# Patient Record
Sex: Female | Born: 1940 | Race: White | Hispanic: No | Smoking: Never smoker
Health system: Southern US, Community
[De-identification: ages and names within clinical notes are randomized; demographics above are authoritative.]

## PROBLEM LIST (undated history)

## (undated) DIAGNOSIS — N189 Chronic kidney disease, unspecified: Secondary | ICD-10-CM

## (undated) DIAGNOSIS — M1990 Unspecified osteoarthritis, unspecified site: Secondary | ICD-10-CM

## (undated) DIAGNOSIS — N179 Acute kidney failure, unspecified: Secondary | ICD-10-CM

## (undated) DIAGNOSIS — I8290 Acute embolism and thrombosis of unspecified vein: Secondary | ICD-10-CM

## (undated) DIAGNOSIS — F32A Depression, unspecified: Secondary | ICD-10-CM

## (undated) DIAGNOSIS — M797 Fibromyalgia: Secondary | ICD-10-CM

## (undated) DIAGNOSIS — T7840XA Allergy, unspecified, initial encounter: Secondary | ICD-10-CM

## (undated) DIAGNOSIS — K449 Diaphragmatic hernia without obstruction or gangrene: Secondary | ICD-10-CM

## (undated) DIAGNOSIS — N200 Calculus of kidney: Secondary | ICD-10-CM

## (undated) DIAGNOSIS — Z87442 Personal history of urinary calculi: Secondary | ICD-10-CM

## (undated) DIAGNOSIS — I10 Essential (primary) hypertension: Secondary | ICD-10-CM

## (undated) DIAGNOSIS — F419 Anxiety disorder, unspecified: Secondary | ICD-10-CM

## (undated) DIAGNOSIS — E785 Hyperlipidemia, unspecified: Secondary | ICD-10-CM

## (undated) DIAGNOSIS — R42 Dizziness and giddiness: Secondary | ICD-10-CM

## (undated) DIAGNOSIS — K219 Gastro-esophageal reflux disease without esophagitis: Secondary | ICD-10-CM

## (undated) DIAGNOSIS — N3010 Interstitial cystitis (chronic) without hematuria: Secondary | ICD-10-CM

## (undated) DIAGNOSIS — Z9889 Other specified postprocedural states: Secondary | ICD-10-CM

## (undated) DIAGNOSIS — R519 Headache, unspecified: Secondary | ICD-10-CM

## (undated) DIAGNOSIS — F329 Major depressive disorder, single episode, unspecified: Secondary | ICD-10-CM

## (undated) DIAGNOSIS — K5730 Diverticulosis of large intestine without perforation or abscess without bleeding: Secondary | ICD-10-CM

## (undated) DIAGNOSIS — C4491 Basal cell carcinoma of skin, unspecified: Secondary | ICD-10-CM

## (undated) DIAGNOSIS — D649 Anemia, unspecified: Secondary | ICD-10-CM

## (undated) DIAGNOSIS — M419 Scoliosis, unspecified: Secondary | ICD-10-CM

## (undated) DIAGNOSIS — F450 Somatization disorder: Secondary | ICD-10-CM

## (undated) DIAGNOSIS — M8580 Other specified disorders of bone density and structure, unspecified site: Secondary | ICD-10-CM

## (undated) DIAGNOSIS — C801 Malignant (primary) neoplasm, unspecified: Secondary | ICD-10-CM

## (undated) DIAGNOSIS — R112 Nausea with vomiting, unspecified: Secondary | ICD-10-CM

## (undated) DIAGNOSIS — I2510 Atherosclerotic heart disease of native coronary artery without angina pectoris: Secondary | ICD-10-CM

## (undated) HISTORY — DX: Gastro-esophageal reflux disease without esophagitis: K21.9

## (undated) HISTORY — DX: Diaphragmatic hernia without obstruction or gangrene: K44.9

## (undated) HISTORY — DX: Major depressive disorder, single episode, unspecified: F32.9

## (undated) HISTORY — DX: Depression, unspecified: F32.A

## (undated) HISTORY — DX: Diverticulosis of large intestine without perforation or abscess without bleeding: K57.30

## (undated) HISTORY — PX: UPPER GASTROINTESTINAL ENDOSCOPY: SHX188

## (undated) HISTORY — DX: Chronic kidney disease, unspecified: N18.9

## (undated) HISTORY — DX: Allergy, unspecified, initial encounter: T78.40XA

## (undated) HISTORY — DX: Scoliosis, unspecified: M41.9

## (undated) HISTORY — DX: Interstitial cystitis (chronic) without hematuria: N30.10

## (undated) HISTORY — DX: Atherosclerotic heart disease of native coronary artery without angina pectoris: I25.10

## (undated) HISTORY — PX: CHOLECYSTECTOMY: SHX55

## (undated) HISTORY — PX: TOTAL KNEE ARTHROPLASTY: SHX125

## (undated) HISTORY — DX: Unspecified osteoarthritis, unspecified site: M19.90

## (undated) HISTORY — DX: Basal cell carcinoma of skin, unspecified: C44.91

## (undated) HISTORY — DX: Hyperlipidemia, unspecified: E78.5

## (undated) HISTORY — PX: CATARACT EXTRACTION: SUR2

## (undated) HISTORY — DX: Acute kidney failure, unspecified: N17.9

## (undated) HISTORY — PX: HIATAL HERNIA REPAIR: SHX195

## (undated) HISTORY — DX: Other specified disorders of bone density and structure, unspecified site: M85.80

## (undated) HISTORY — PX: COLOSTOMY: SHX63

## (undated) HISTORY — DX: Essential (primary) hypertension: I10

## (undated) HISTORY — DX: Calculus of kidney: N20.0

## (undated) HISTORY — DX: Somatization disorder: F45.0

## (undated) HISTORY — PX: LUMBAR DISC SURGERY: SHX700

## (undated) HISTORY — DX: Anxiety disorder, unspecified: F41.9

## (undated) HISTORY — DX: Malignant (primary) neoplasm, unspecified: C80.1

---

## 1998-05-25 ENCOUNTER — Ambulatory Visit: Admission: RE | Admit: 1998-05-25 | Discharge: 1998-05-25 | Payer: Self-pay | Admitting: Family Medicine

## 1999-03-29 ENCOUNTER — Inpatient Hospital Stay (HOSPITAL_COMMUNITY): Admission: EM | Admit: 1999-03-29 | Discharge: 1999-04-06 | Payer: Self-pay | Admitting: Vascular Surgery

## 1999-04-05 ENCOUNTER — Encounter: Payer: Self-pay | Admitting: Vascular Surgery

## 1999-04-23 HISTORY — PX: CYSTECTOMY: SUR359

## 1999-04-23 HISTORY — PX: BLADDER TUMOR EXCISION: SHX238

## 1999-05-15 ENCOUNTER — Encounter: Admission: RE | Admit: 1999-05-15 | Discharge: 1999-05-15 | Payer: Self-pay | Admitting: Internal Medicine

## 1999-05-16 ENCOUNTER — Emergency Department (HOSPITAL_COMMUNITY): Admission: EM | Admit: 1999-05-16 | Discharge: 1999-05-16 | Payer: Self-pay | Admitting: Emergency Medicine

## 1999-05-16 ENCOUNTER — Encounter: Payer: Self-pay | Admitting: Emergency Medicine

## 1999-08-09 ENCOUNTER — Encounter: Admission: RE | Admit: 1999-08-09 | Discharge: 1999-08-09 | Payer: Self-pay | Admitting: Family Medicine

## 1999-08-09 ENCOUNTER — Encounter: Payer: Self-pay | Admitting: Family Medicine

## 2000-04-09 ENCOUNTER — Emergency Department (HOSPITAL_COMMUNITY): Admission: EM | Admit: 2000-04-09 | Discharge: 2000-04-09 | Payer: Self-pay | Admitting: Emergency Medicine

## 2000-04-10 ENCOUNTER — Encounter: Payer: Self-pay | Admitting: Emergency Medicine

## 2000-10-14 ENCOUNTER — Encounter: Admission: RE | Admit: 2000-10-14 | Discharge: 2000-10-14 | Payer: Self-pay | Admitting: Orthopedic Surgery

## 2000-10-14 ENCOUNTER — Encounter: Payer: Self-pay | Admitting: Orthopedic Surgery

## 2001-04-23 ENCOUNTER — Encounter: Payer: Self-pay | Admitting: Internal Medicine

## 2001-04-23 ENCOUNTER — Encounter: Admission: RE | Admit: 2001-04-23 | Discharge: 2001-04-23 | Payer: Self-pay | Admitting: Internal Medicine

## 2001-05-01 ENCOUNTER — Encounter: Payer: Self-pay | Admitting: Internal Medicine

## 2001-05-12 ENCOUNTER — Encounter: Payer: Self-pay | Admitting: Orthopedic Surgery

## 2001-05-12 ENCOUNTER — Encounter: Admission: RE | Admit: 2001-05-12 | Discharge: 2001-05-12 | Payer: Self-pay | Admitting: Orthopedic Surgery

## 2001-07-08 ENCOUNTER — Encounter: Payer: Self-pay | Admitting: Gastroenterology

## 2001-11-20 ENCOUNTER — Encounter: Payer: Self-pay | Admitting: Emergency Medicine

## 2001-11-20 ENCOUNTER — Emergency Department (HOSPITAL_COMMUNITY): Admission: EM | Admit: 2001-11-20 | Discharge: 2001-11-20 | Payer: Self-pay | Admitting: Emergency Medicine

## 2002-05-25 ENCOUNTER — Encounter: Payer: Self-pay | Admitting: Internal Medicine

## 2002-05-25 ENCOUNTER — Encounter: Admission: RE | Admit: 2002-05-25 | Discharge: 2002-05-25 | Payer: Self-pay | Admitting: Internal Medicine

## 2002-06-08 ENCOUNTER — Encounter (INDEPENDENT_AMBULATORY_CARE_PROVIDER_SITE_OTHER): Payer: Self-pay | Admitting: *Deleted

## 2002-06-08 ENCOUNTER — Encounter: Admission: RE | Admit: 2002-06-08 | Discharge: 2002-06-08 | Payer: Self-pay | Admitting: Otolaryngology

## 2002-06-08 ENCOUNTER — Encounter: Payer: Self-pay | Admitting: Otolaryngology

## 2002-07-20 ENCOUNTER — Other Ambulatory Visit: Admission: RE | Admit: 2002-07-20 | Discharge: 2002-07-20 | Payer: Self-pay | Admitting: Internal Medicine

## 2002-12-01 ENCOUNTER — Encounter: Payer: Self-pay | Admitting: Specialist

## 2002-12-03 ENCOUNTER — Inpatient Hospital Stay (HOSPITAL_COMMUNITY): Admission: RE | Admit: 2002-12-03 | Discharge: 2002-12-09 | Payer: Self-pay | Admitting: Specialist

## 2003-06-08 ENCOUNTER — Encounter: Admission: RE | Admit: 2003-06-08 | Discharge: 2003-06-08 | Payer: Self-pay | Admitting: Internal Medicine

## 2004-02-21 ENCOUNTER — Ambulatory Visit: Payer: Self-pay | Admitting: Internal Medicine

## 2004-02-28 ENCOUNTER — Ambulatory Visit: Payer: Self-pay | Admitting: Gastroenterology

## 2004-06-12 ENCOUNTER — Encounter: Admission: RE | Admit: 2004-06-12 | Discharge: 2004-06-12 | Payer: Self-pay | Admitting: Obstetrics and Gynecology

## 2004-07-06 ENCOUNTER — Ambulatory Visit: Payer: Self-pay | Admitting: Internal Medicine

## 2004-11-01 ENCOUNTER — Ambulatory Visit: Payer: Self-pay | Admitting: Family Medicine

## 2005-03-07 ENCOUNTER — Ambulatory Visit: Payer: Self-pay | Admitting: Internal Medicine

## 2005-03-11 ENCOUNTER — Encounter (INDEPENDENT_AMBULATORY_CARE_PROVIDER_SITE_OTHER): Payer: Self-pay | Admitting: *Deleted

## 2005-03-11 ENCOUNTER — Encounter: Admission: RE | Admit: 2005-03-11 | Discharge: 2005-03-11 | Payer: Self-pay | Admitting: Internal Medicine

## 2005-04-03 ENCOUNTER — Ambulatory Visit: Payer: Self-pay | Admitting: Internal Medicine

## 2005-04-10 ENCOUNTER — Ambulatory Visit: Payer: Self-pay | Admitting: Internal Medicine

## 2005-06-05 ENCOUNTER — Emergency Department (HOSPITAL_COMMUNITY): Admission: EM | Admit: 2005-06-05 | Discharge: 2005-06-05 | Payer: Self-pay | Admitting: Emergency Medicine

## 2005-09-12 ENCOUNTER — Ambulatory Visit (HOSPITAL_COMMUNITY): Admission: RE | Admit: 2005-09-12 | Discharge: 2005-09-12 | Payer: Self-pay | Admitting: Obstetrics and Gynecology

## 2005-11-06 ENCOUNTER — Encounter: Payer: Self-pay | Admitting: Internal Medicine

## 2005-11-20 HISTORY — PX: TENDON REPAIR: SHX5111

## 2006-07-28 ENCOUNTER — Ambulatory Visit: Payer: Self-pay | Admitting: Internal Medicine

## 2006-07-28 LAB — CONVERTED CEMR LAB
ALT: 13 units/L (ref 0–40)
AST: 20 units/L (ref 0–37)
Albumin: 4 g/dL (ref 3.5–5.2)
Alkaline Phosphatase: 63 units/L (ref 39–117)
BUN: 18 mg/dL (ref 6–23)
Basophils Absolute: 0 10*3/uL (ref 0.0–0.1)
Basophils Relative: 1 % (ref 0.0–1.0)
Bilirubin, Direct: 0.1 mg/dL (ref 0.0–0.3)
CO2: 30 meq/L (ref 19–32)
Calcium: 10.2 mg/dL (ref 8.4–10.5)
Chloride: 104 meq/L (ref 96–112)
Cholesterol: 234 mg/dL (ref 0–200)
Creatinine, Ser: 1 mg/dL (ref 0.4–1.2)
Direct LDL: 157.3 mg/dL
Eosinophils Absolute: 0.1 10*3/uL (ref 0.0–0.6)
Eosinophils Relative: 2.6 % (ref 0.0–5.0)
GFR calc Af Amer: 72 mL/min
GFR calc non Af Amer: 59 mL/min
Glucose, Bld: 108 mg/dL — ABNORMAL HIGH (ref 70–99)
HCT: 42.5 % (ref 36.0–46.0)
HDL: 54.6 mg/dL (ref >39.0)
Hemoglobin: 14.5 g/dL (ref 12.0–15.0)
Lymphocytes Relative: 25.6 % (ref 12.0–46.0)
MCHC: 34 g/dL (ref 30.0–36.0)
MCV: 87.9 fL (ref 78.0–100.0)
Monocytes Absolute: 0.3 10*3/uL (ref 0.2–0.7)
Monocytes Relative: 7.3 % (ref 3.0–11.0)
Neutro Abs: 2.9 10*3/uL (ref 1.4–7.7)
Neutrophils Relative %: 63.5 % (ref 43.0–77.0)
Platelets: 194 10*3/uL (ref 150–400)
Potassium: 3.8 meq/L (ref 3.5–5.1)
RBC: 4.83 M/uL (ref 3.87–5.11)
RDW: 14.1 % (ref 11.5–14.6)
Sodium: 141 meq/L (ref 135–145)
TSH: 3.29 microintl units/mL (ref 0.35–5.50)
Total Bilirubin: 1 mg/dL (ref 0.3–1.2)
Total CHOL/HDL Ratio: 4.3
Total Protein: 7.1 g/dL (ref 6.0–8.3)
Triglycerides: 176 mg/dL — ABNORMAL HIGH (ref 0–149)
VLDL: 35 mg/dL (ref 0–40)
WBC: 4.5 10*3/uL (ref 4.5–10.5)

## 2006-09-18 ENCOUNTER — Ambulatory Visit (HOSPITAL_COMMUNITY): Admission: RE | Admit: 2006-09-18 | Discharge: 2006-09-18 | Payer: Self-pay | Admitting: Internal Medicine

## 2006-10-09 ENCOUNTER — Ambulatory Visit: Payer: Self-pay | Admitting: Internal Medicine

## 2006-10-09 LAB — CONVERTED CEMR LAB
ALT: 16 units/L (ref 0–40)
AST: 23 units/L (ref 0–37)
Cholesterol: 178 mg/dL (ref 0–200)
Glucose, Bld: 105 mg/dL — ABNORMAL HIGH (ref 70–99)
HDL: 48.7 mg/dL (ref >39.0)
LDL Cholesterol: 114 mg/dL — ABNORMAL HIGH (ref 0–99)
Total CHOL/HDL Ratio: 3.7
Triglycerides: 76 mg/dL (ref 0–149)
VLDL: 15 mg/dL (ref 0–40)

## 2006-10-22 ENCOUNTER — Telehealth: Payer: Self-pay | Admitting: Internal Medicine

## 2006-12-01 ENCOUNTER — Telehealth: Payer: Self-pay | Admitting: Internal Medicine

## 2006-12-24 DIAGNOSIS — E785 Hyperlipidemia, unspecified: Secondary | ICD-10-CM | POA: Insufficient documentation

## 2006-12-24 DIAGNOSIS — M1990 Unspecified osteoarthritis, unspecified site: Secondary | ICD-10-CM | POA: Insufficient documentation

## 2006-12-24 DIAGNOSIS — F411 Generalized anxiety disorder: Secondary | ICD-10-CM | POA: Insufficient documentation

## 2006-12-24 DIAGNOSIS — K219 Gastro-esophageal reflux disease without esophagitis: Secondary | ICD-10-CM | POA: Insufficient documentation

## 2006-12-24 DIAGNOSIS — I10 Essential (primary) hypertension: Secondary | ICD-10-CM | POA: Insufficient documentation

## 2006-12-24 DIAGNOSIS — K449 Diaphragmatic hernia without obstruction or gangrene: Secondary | ICD-10-CM | POA: Insufficient documentation

## 2007-03-27 ENCOUNTER — Telehealth: Payer: Self-pay | Admitting: Internal Medicine

## 2007-05-25 ENCOUNTER — Telehealth: Payer: Self-pay | Admitting: Internal Medicine

## 2007-08-05 ENCOUNTER — Telehealth: Payer: Self-pay | Admitting: *Deleted

## 2007-09-02 ENCOUNTER — Telehealth: Payer: Self-pay | Admitting: Internal Medicine

## 2007-09-02 ENCOUNTER — Encounter: Payer: Self-pay | Admitting: Internal Medicine

## 2007-10-02 ENCOUNTER — Ambulatory Visit: Payer: Self-pay | Admitting: Internal Medicine

## 2007-10-09 ENCOUNTER — Ambulatory Visit: Payer: Self-pay | Admitting: Internal Medicine

## 2007-10-12 LAB — CONVERTED CEMR LAB
ALT: 15 units/L (ref 0–35)
AST: 19 units/L (ref 0–37)
Albumin: 3.6 g/dL (ref 3.5–5.2)
Alkaline Phosphatase: 65 units/L (ref 39–117)
BUN: 22 mg/dL (ref 6–23)
Basophils Absolute: 0 10*3/uL (ref 0.0–0.1)
Basophils Relative: 0.6 % (ref 0.0–1.0)
Bilirubin, Direct: 0.1 mg/dL (ref 0.0–0.3)
CO2: 30 meq/L (ref 19–32)
Calcium: 9.3 mg/dL (ref 8.4–10.5)
Chloride: 109 meq/L (ref 96–112)
Cholesterol: 259 mg/dL (ref 0–200)
Creatinine, Ser: 1.1 mg/dL (ref 0.4–1.2)
Direct LDL: 179.1 mg/dL
Eosinophils Absolute: 0.2 10*3/uL (ref 0.0–0.7)
Eosinophils Relative: 4.7 % (ref 0.0–5.0)
GFR calc Af Amer: 64 mL/min
GFR calc non Af Amer: 53 mL/min
Glucose, Bld: 92 mg/dL (ref 70–99)
HCT: 39.8 % (ref 36.0–46.0)
HDL: 55 mg/dL (ref >39.0)
Hemoglobin: 13.4 g/dL (ref 12.0–15.0)
Lymphocytes Relative: 33.8 % (ref 12.0–46.0)
MCHC: 33.7 g/dL (ref 30.0–36.0)
MCV: 90.2 fL (ref 78.0–100.0)
Monocytes Absolute: 0.3 10*3/uL (ref 0.1–1.0)
Monocytes Relative: 7.6 % (ref 3.0–12.0)
Neutro Abs: 1.8 10*3/uL (ref 1.4–7.7)
Neutrophils Relative %: 53.3 % (ref 43.0–77.0)
Platelets: 165 10*3/uL (ref 150–400)
Potassium: 4.3 meq/L (ref 3.5–5.1)
RBC: 4.41 M/uL (ref 3.87–5.11)
RDW: 12.4 % (ref 11.5–14.6)
Sodium: 143 meq/L (ref 135–145)
TSH: 4.43 microintl units/mL (ref 0.35–5.50)
Total Bilirubin: 0.7 mg/dL (ref 0.3–1.2)
Total CHOL/HDL Ratio: 4.7
Total Protein: 6.4 g/dL (ref 6.0–8.3)
Triglycerides: 91 mg/dL (ref 0–149)
VLDL: 18 mg/dL (ref 0–40)
WBC: 3.5 10*3/uL — ABNORMAL LOW (ref 4.5–10.5)

## 2007-10-16 ENCOUNTER — Telehealth: Payer: Self-pay | Admitting: Internal Medicine

## 2007-10-20 ENCOUNTER — Ambulatory Visit (HOSPITAL_COMMUNITY): Admission: RE | Admit: 2007-10-20 | Discharge: 2007-10-20 | Payer: Self-pay | Admitting: Internal Medicine

## 2007-11-29 ENCOUNTER — Emergency Department (HOSPITAL_COMMUNITY): Admission: EM | Admit: 2007-11-29 | Discharge: 2007-11-30 | Payer: Self-pay | Admitting: Emergency Medicine

## 2007-12-09 ENCOUNTER — Ambulatory Visit: Payer: Self-pay | Admitting: Family Medicine

## 2007-12-09 DIAGNOSIS — B354 Tinea corporis: Secondary | ICD-10-CM | POA: Insufficient documentation

## 2008-01-19 ENCOUNTER — Encounter: Payer: Self-pay | Admitting: Internal Medicine

## 2008-01-22 ENCOUNTER — Telehealth: Payer: Self-pay | Admitting: Internal Medicine

## 2008-02-06 ENCOUNTER — Ambulatory Visit: Payer: Self-pay | Admitting: Internal Medicine

## 2008-02-11 ENCOUNTER — Telehealth: Payer: Self-pay | Admitting: Internal Medicine

## 2008-02-16 ENCOUNTER — Ambulatory Visit: Payer: Self-pay | Admitting: Internal Medicine

## 2008-02-16 DIAGNOSIS — R079 Chest pain, unspecified: Secondary | ICD-10-CM | POA: Insufficient documentation

## 2008-02-16 DIAGNOSIS — F4321 Adjustment disorder with depressed mood: Secondary | ICD-10-CM | POA: Insufficient documentation

## 2008-02-25 ENCOUNTER — Telehealth: Payer: Self-pay | Admitting: Gastroenterology

## 2008-03-01 ENCOUNTER — Telehealth: Payer: Self-pay | Admitting: Internal Medicine

## 2008-03-04 ENCOUNTER — Ambulatory Visit: Payer: Self-pay | Admitting: Cardiology

## 2008-03-15 ENCOUNTER — Ambulatory Visit: Payer: Self-pay

## 2008-03-15 ENCOUNTER — Encounter: Payer: Self-pay | Admitting: Internal Medicine

## 2008-04-12 ENCOUNTER — Other Ambulatory Visit: Admission: RE | Admit: 2008-04-12 | Discharge: 2008-04-12 | Payer: Self-pay | Admitting: Internal Medicine

## 2008-04-12 ENCOUNTER — Ambulatory Visit: Payer: Self-pay | Admitting: Internal Medicine

## 2008-04-12 DIAGNOSIS — I8390 Asymptomatic varicose veins of unspecified lower extremity: Secondary | ICD-10-CM | POA: Insufficient documentation

## 2008-04-12 DIAGNOSIS — K589 Irritable bowel syndrome without diarrhea: Secondary | ICD-10-CM | POA: Insufficient documentation

## 2008-04-12 DIAGNOSIS — M949 Disorder of cartilage, unspecified: Secondary | ICD-10-CM

## 2008-04-12 DIAGNOSIS — M899 Disorder of bone, unspecified: Secondary | ICD-10-CM | POA: Insufficient documentation

## 2008-04-19 ENCOUNTER — Telehealth: Payer: Self-pay | Admitting: Internal Medicine

## 2008-05-02 DIAGNOSIS — Z8719 Personal history of other diseases of the digestive system: Secondary | ICD-10-CM | POA: Insufficient documentation

## 2008-05-02 DIAGNOSIS — R1310 Dysphagia, unspecified: Secondary | ICD-10-CM | POA: Insufficient documentation

## 2008-05-05 ENCOUNTER — Ambulatory Visit: Payer: Self-pay | Admitting: Gastroenterology

## 2008-05-05 DIAGNOSIS — F450 Somatization disorder: Secondary | ICD-10-CM | POA: Insufficient documentation

## 2008-05-05 DIAGNOSIS — F4323 Adjustment disorder with mixed anxiety and depressed mood: Secondary | ICD-10-CM | POA: Insufficient documentation

## 2008-05-05 HISTORY — DX: Somatization disorder: F45.0

## 2008-05-05 LAB — CONVERTED CEMR LAB: Tissue Transglutaminase Ab, IgA: 0.2 units (ref <7)

## 2008-05-06 LAB — CONVERTED CEMR LAB
ALT: 18 units/L (ref 0–35)
AST: 18 units/L (ref 0–37)
Albumin: 3.7 g/dL (ref 3.5–5.2)
BUN: 18 mg/dL (ref 6–23)
Basophils Absolute: 0 10*3/uL (ref 0.0–0.1)
Basophils Relative: 0.1 % (ref 0.0–3.0)
Calcium: 9.5 mg/dL (ref 8.4–10.5)
Chloride: 108 meq/L (ref 96–112)
Creatinine, Ser: 1 mg/dL (ref 0.4–1.2)
Eosinophils Absolute: 0.1 10*3/uL (ref 0.0–0.7)
Ferritin: 64.6 ng/mL (ref 10.0–291.0)
Folate: >20 ng/mL
GFR calc non Af Amer: 59 mL/min
Glucose, Bld: 96 mg/dL (ref 70–99)
HCT: 38 % (ref 36.0–46.0)
Hemoglobin: 12.8 g/dL (ref 12.0–15.0)
Iron: 42 ug/dL (ref 42–145)
Lymphocytes Relative: 14 % (ref 12.0–46.0)
MCHC: 33.7 g/dL (ref 30.0–36.0)
MCV: 90.4 fL (ref 78.0–100.0)
Monocytes Absolute: 0.5 10*3/uL (ref 0.1–1.0)
Monocytes Relative: 5.6 % (ref 3.0–12.0)
Neutro Abs: 7.2 10*3/uL (ref 1.4–7.7)
Neutrophils Relative %: 79.4 % — ABNORMAL HIGH (ref 43.0–77.0)
Platelets: 191 10*3/uL (ref 150–400)
RBC: 4.2 M/uL (ref 3.87–5.11)
RDW: 12.9 % (ref 11.5–14.6)
Saturation Ratios: 14.1 % — ABNORMAL LOW (ref 20.0–50.0)
Sodium: 143 meq/L (ref 135–145)
TSH: 1.62 microintl units/mL (ref 0.35–5.50)
Total Bilirubin: 0.9 mg/dL (ref 0.3–1.2)
Total Protein: 6.8 g/dL (ref 6.0–8.3)
Transferrin: 212.4 mg/dL (ref >=212.0)
Vitamin B-12: 220 pg/mL (ref 211–911)
WBC: 9.1 10*3/uL (ref 4.5–10.5)

## 2008-05-10 ENCOUNTER — Ambulatory Visit: Payer: Self-pay | Admitting: Gastroenterology

## 2008-05-10 DIAGNOSIS — D518 Other vitamin B12 deficiency anemias: Secondary | ICD-10-CM | POA: Insufficient documentation

## 2008-05-17 ENCOUNTER — Ambulatory Visit: Payer: Self-pay | Admitting: Gastroenterology

## 2008-05-25 ENCOUNTER — Telehealth: Payer: Self-pay | Admitting: Gastroenterology

## 2008-05-26 ENCOUNTER — Ambulatory Visit: Payer: Self-pay | Admitting: Gastroenterology

## 2008-06-15 ENCOUNTER — Ambulatory Visit: Payer: Self-pay | Admitting: Internal Medicine

## 2008-06-15 ENCOUNTER — Ambulatory Visit: Payer: Self-pay | Admitting: Gastroenterology

## 2008-06-17 ENCOUNTER — Ambulatory Visit: Payer: Self-pay | Admitting: Family Medicine

## 2008-06-27 ENCOUNTER — Telehealth: Payer: Self-pay | Admitting: Internal Medicine

## 2008-06-29 ENCOUNTER — Ambulatory Visit: Payer: Self-pay | Admitting: Gastroenterology

## 2008-06-29 ENCOUNTER — Encounter: Payer: Self-pay | Admitting: Internal Medicine

## 2008-06-29 ENCOUNTER — Encounter: Payer: Self-pay | Admitting: Gastroenterology

## 2008-07-01 ENCOUNTER — Encounter: Payer: Self-pay | Admitting: Gastroenterology

## 2008-07-06 ENCOUNTER — Ambulatory Visit: Payer: Self-pay | Admitting: Gastroenterology

## 2008-07-06 ENCOUNTER — Encounter: Payer: Self-pay | Admitting: Gastroenterology

## 2008-07-11 ENCOUNTER — Encounter: Payer: Self-pay | Admitting: Gastroenterology

## 2008-07-15 ENCOUNTER — Ambulatory Visit: Payer: Self-pay | Admitting: Internal Medicine

## 2008-07-15 LAB — CONVERTED CEMR LAB
ALT: 18 units/L (ref 0–35)
AST: 22 units/L (ref 0–37)
Albumin: 3.4 g/dL — ABNORMAL LOW (ref 3.5–5.2)
Alkaline Phosphatase: 72 units/L (ref 39–117)
Basophils Absolute: 0 10*3/uL (ref 0.0–0.1)
Basophils Relative: 0.3 % (ref 0.0–3.0)
Bilirubin, Direct: 0.1 mg/dL (ref 0.0–0.3)
Calcium: 9.3 mg/dL (ref 8.4–10.5)
Chloride: 112 meq/L (ref 96–112)
Cholesterol: 113 mg/dL (ref 0–200)
Eosinophils Absolute: 0.3 10*3/uL (ref 0.0–0.7)
Eosinophils Relative: 4.1 % (ref 0.0–5.0)
GFR calc non Af Amer: 66.28 mL/min (ref >60)
Glucose, Bld: 98 mg/dL (ref 70–99)
HCT: 33.8 % — ABNORMAL LOW (ref 36.0–46.0)
HDL: 54.2 mg/dL (ref >39.00)
Hemoglobin: 11.5 g/dL — ABNORMAL LOW (ref 12.0–15.0)
LDL Cholesterol: 48 mg/dL (ref 0–99)
Lymphocytes Relative: 23.8 % (ref 12.0–46.0)
Lymphs Abs: 1.5 10*3/uL (ref 0.7–4.0)
MCHC: 34.1 g/dL (ref 30.0–36.0)
MCV: 87.8 fL (ref 78.0–100.0)
Monocytes Absolute: 0.4 10*3/uL (ref 0.1–1.0)
Monocytes Relative: 7 % (ref 3.0–12.0)
Neutro Abs: 4 10*3/uL (ref 1.4–7.7)
Neutrophils Relative %: 64.8 % (ref 43.0–77.0)
Platelets: 154 10*3/uL (ref 150.0–400.0)
Potassium: 3.5 meq/L (ref 3.5–5.1)
RBC: 3.85 M/uL — ABNORMAL LOW (ref 3.87–5.11)
RDW: 13.7 % (ref 11.5–14.6)
Sodium: 147 meq/L — ABNORMAL HIGH (ref 135–145)
TSH: 2.97 microintl units/mL (ref 0.35–5.50)
Total Bilirubin: 0.7 mg/dL (ref 0.3–1.2)
Total CHOL/HDL Ratio: 2
Total Protein: 6.6 g/dL (ref 6.0–8.3)
Triglycerides: 53 mg/dL (ref 0.0–149.0)
VLDL: 10.6 mg/dL (ref 0.0–40.0)
Vit D, 25-Hydroxy: 22 ng/mL — ABNORMAL LOW (ref 30–89)
WBC: 6.2 10*3/uL (ref 4.5–10.5)

## 2008-07-25 ENCOUNTER — Ambulatory Visit: Payer: Self-pay | Admitting: Internal Medicine

## 2008-08-08 ENCOUNTER — Ambulatory Visit: Payer: Self-pay | Admitting: Internal Medicine

## 2008-08-16 ENCOUNTER — Telehealth: Payer: Self-pay | Admitting: Internal Medicine

## 2008-08-17 ENCOUNTER — Telehealth (INDEPENDENT_AMBULATORY_CARE_PROVIDER_SITE_OTHER): Payer: Self-pay | Admitting: *Deleted

## 2008-08-22 ENCOUNTER — Ambulatory Visit: Payer: Self-pay | Admitting: Internal Medicine

## 2008-09-05 ENCOUNTER — Ambulatory Visit: Payer: Self-pay | Admitting: Vascular Surgery

## 2008-09-05 ENCOUNTER — Ambulatory Visit: Payer: Self-pay | Admitting: Internal Medicine

## 2008-09-20 ENCOUNTER — Ambulatory Visit: Payer: Self-pay | Admitting: Internal Medicine

## 2008-09-26 ENCOUNTER — Ambulatory Visit: Payer: Self-pay | Admitting: Internal Medicine

## 2008-09-26 LAB — CONVERTED CEMR LAB
Basophils Absolute: 0 10*3/uL (ref 0.0–0.1)
Basophils Relative: 0.1 % (ref 0.0–3.0)
Eosinophils Absolute: 0.2 10*3/uL (ref 0.0–0.7)
Eosinophils Relative: 3.3 % (ref 0.0–5.0)
Ferritin: 114.7 ng/mL (ref 10.0–291.0)
HCT: 33.7 % — ABNORMAL LOW (ref 36.0–46.0)
Hemoglobin: 11.4 g/dL — ABNORMAL LOW (ref 12.0–15.0)
Iron: 53 ug/dL (ref 42–145)
Lymphocytes Relative: 15.9 % (ref 12.0–46.0)
Lymphs Abs: 1 10*3/uL (ref 0.7–4.0)
MCHC: 34 g/dL (ref 30.0–36.0)
MCV: 87.7 fL (ref 78.0–100.0)
Monocytes Absolute: 0.4 10*3/uL (ref 0.1–1.0)
Monocytes Relative: 6.7 % (ref 3.0–12.0)
Neutro Abs: 4.9 10*3/uL (ref 1.4–7.7)
Neutrophils Relative %: 74 % (ref 43.0–77.0)
Platelets: 166 10*3/uL (ref 150.0–400.0)
RBC: 3.84 M/uL — ABNORMAL LOW (ref 3.87–5.11)
RDW: 13.9 % (ref 11.5–14.6)
Saturation Ratios: 19.9 % — ABNORMAL LOW (ref 20.0–50.0)
Transferrin: 189.8 mg/dL — ABNORMAL LOW (ref 212.0–360.0)
Vit D, 25-Hydroxy: 51 ng/mL (ref 30–89)
Vitamin B-12: 738 pg/mL (ref 211–911)
WBC: 6.5 10*3/uL (ref 4.5–10.5)

## 2008-10-04 ENCOUNTER — Telehealth: Payer: Self-pay | Admitting: Internal Medicine

## 2008-10-05 ENCOUNTER — Ambulatory Visit: Payer: Self-pay | Admitting: Internal Medicine

## 2008-10-05 LAB — CONVERTED CEMR LAB
Alpha-2-Globulin: 15.1 % — ABNORMAL HIGH (ref 7.1–11.8)
Gamma Globulin: 15.3 % (ref 11.1–18.8)

## 2008-10-10 ENCOUNTER — Ambulatory Visit: Payer: Self-pay | Admitting: Internal Medicine

## 2008-10-10 DIAGNOSIS — E559 Vitamin D deficiency, unspecified: Secondary | ICD-10-CM | POA: Insufficient documentation

## 2008-10-17 ENCOUNTER — Telehealth: Payer: Self-pay | Admitting: Internal Medicine

## 2008-11-09 ENCOUNTER — Ambulatory Visit: Payer: Self-pay | Admitting: Internal Medicine

## 2008-11-09 LAB — CONVERTED CEMR LAB
Basophils Absolute: 0 10*3/uL (ref 0.0–0.1)
Cortisol, Plasma: 15.7 ug/dL
Eosinophils Absolute: 0.2 10*3/uL (ref 0.0–0.7)
Hemoglobin: 11.1 g/dL — ABNORMAL LOW (ref 12.0–15.0)
Lymphocytes Relative: 13.6 % (ref 12.0–46.0)
MCHC: 33.6 g/dL (ref 30.0–36.0)
Monocytes Relative: 6.3 % (ref 3.0–12.0)
Neutro Abs: 5.8 10*3/uL (ref 1.4–7.7)
Neutrophils Relative %: 76.8 % (ref 43.0–77.0)
RBC: 3.86 M/uL — ABNORMAL LOW (ref 3.87–5.11)
RDW: 13.7 % (ref 11.5–14.6)
Saturation Ratios: 16.7 % — ABNORMAL LOW (ref 20.0–50.0)
TSH: 1.83 microintl units/mL (ref 0.35–5.50)
Transferrin: 175.4 mg/dL — ABNORMAL LOW (ref 212.0–360.0)
Vitamin B-12: 389 pg/mL (ref 211–911)

## 2008-11-15 ENCOUNTER — Ambulatory Visit: Payer: Self-pay | Admitting: Internal Medicine

## 2008-11-15 DIAGNOSIS — IMO0001 Reserved for inherently not codable concepts without codable children: Secondary | ICD-10-CM | POA: Insufficient documentation

## 2008-11-15 DIAGNOSIS — D509 Iron deficiency anemia, unspecified: Secondary | ICD-10-CM | POA: Insufficient documentation

## 2008-11-30 ENCOUNTER — Emergency Department (HOSPITAL_COMMUNITY): Admission: EM | Admit: 2008-11-30 | Discharge: 2008-11-30 | Payer: Self-pay | Admitting: Emergency Medicine

## 2008-12-12 ENCOUNTER — Ambulatory Visit: Payer: Self-pay | Admitting: Vascular Surgery

## 2008-12-12 ENCOUNTER — Encounter: Payer: Self-pay | Admitting: Internal Medicine

## 2008-12-15 ENCOUNTER — Telehealth: Payer: Self-pay | Admitting: *Deleted

## 2008-12-15 ENCOUNTER — Telehealth: Payer: Self-pay | Admitting: Internal Medicine

## 2008-12-15 ENCOUNTER — Emergency Department: Admission: EM | Admit: 2008-12-15 | Discharge: 2008-12-15 | Payer: Self-pay | Admitting: Emergency Medicine

## 2008-12-16 ENCOUNTER — Encounter (INDEPENDENT_AMBULATORY_CARE_PROVIDER_SITE_OTHER): Payer: Self-pay | Admitting: *Deleted

## 2009-01-02 ENCOUNTER — Encounter: Payer: Self-pay | Admitting: Internal Medicine

## 2009-01-04 ENCOUNTER — Ambulatory Visit: Payer: Self-pay | Admitting: Internal Medicine

## 2009-01-04 DIAGNOSIS — Z87442 Personal history of urinary calculi: Secondary | ICD-10-CM | POA: Insufficient documentation

## 2009-01-23 ENCOUNTER — Telehealth: Payer: Self-pay | Admitting: Internal Medicine

## 2009-01-23 ENCOUNTER — Emergency Department (HOSPITAL_COMMUNITY): Admission: EM | Admit: 2009-01-23 | Discharge: 2009-01-23 | Payer: Self-pay | Admitting: Emergency Medicine

## 2009-01-23 ENCOUNTER — Ambulatory Visit: Payer: Self-pay | Admitting: Vascular Surgery

## 2009-01-25 ENCOUNTER — Telehealth: Payer: Self-pay | Admitting: Internal Medicine

## 2009-01-25 ENCOUNTER — Encounter: Payer: Self-pay | Admitting: Internal Medicine

## 2009-02-13 ENCOUNTER — Encounter: Payer: Self-pay | Admitting: Internal Medicine

## 2009-02-27 ENCOUNTER — Ambulatory Visit: Payer: Self-pay | Admitting: Vascular Surgery

## 2009-03-06 ENCOUNTER — Telehealth: Payer: Self-pay | Admitting: *Deleted

## 2009-03-07 ENCOUNTER — Ambulatory Visit: Payer: Self-pay | Admitting: Vascular Surgery

## 2009-04-06 ENCOUNTER — Ambulatory Visit: Payer: Self-pay | Admitting: Vascular Surgery

## 2009-04-10 ENCOUNTER — Ambulatory Visit: Payer: Self-pay | Admitting: Vascular Surgery

## 2009-04-17 ENCOUNTER — Emergency Department (HOSPITAL_COMMUNITY): Admission: EM | Admit: 2009-04-17 | Discharge: 2009-04-18 | Payer: Self-pay | Admitting: Emergency Medicine

## 2009-04-25 ENCOUNTER — Ambulatory Visit: Payer: Self-pay | Admitting: Internal Medicine

## 2009-04-25 ENCOUNTER — Ambulatory Visit: Payer: Self-pay | Admitting: Vascular Surgery

## 2009-04-25 DIAGNOSIS — H3320 Serous retinal detachment, unspecified eye: Secondary | ICD-10-CM | POA: Insufficient documentation

## 2009-05-25 ENCOUNTER — Ambulatory Visit: Payer: Self-pay | Admitting: Vascular Surgery

## 2009-07-06 ENCOUNTER — Ambulatory Visit: Payer: Self-pay | Admitting: Vascular Surgery

## 2009-07-12 ENCOUNTER — Telehealth: Payer: Self-pay | Admitting: *Deleted

## 2009-07-12 ENCOUNTER — Ambulatory Visit: Payer: Self-pay | Admitting: Internal Medicine

## 2009-07-12 ENCOUNTER — Inpatient Hospital Stay (HOSPITAL_COMMUNITY): Admission: EM | Admit: 2009-07-12 | Discharge: 2009-07-16 | Payer: Self-pay | Admitting: Internal Medicine

## 2009-07-12 DIAGNOSIS — N179 Acute kidney failure, unspecified: Secondary | ICD-10-CM

## 2009-07-12 DIAGNOSIS — R109 Unspecified abdominal pain: Secondary | ICD-10-CM | POA: Insufficient documentation

## 2009-07-12 HISTORY — DX: Acute kidney failure, unspecified: N17.9

## 2009-07-12 LAB — CONVERTED CEMR LAB
ALT: 30 units/L (ref 0–35)
AST: 42 units/L — ABNORMAL HIGH (ref 0–37)
Alkaline Phosphatase: 89 units/L (ref 39–117)
BUN: 56 mg/dL — ABNORMAL HIGH (ref 6–23)
Basophils Absolute: 0 10*3/uL (ref 0.0–0.1)
Bilirubin, Direct: 0 mg/dL (ref 0.0–0.3)
CO2: 24 meq/L (ref 19–32)
Calcium: 9.1 mg/dL (ref 8.4–10.5)
Eosinophils Absolute: 0.1 10*3/uL (ref 0.0–0.7)
GFR calc non Af Amer: 7.26 mL/min (ref >60)
Glucose, Bld: 106 mg/dL — ABNORMAL HIGH (ref 70–99)
Glucose, Urine, Semiquant: NEGATIVE
Hemoglobin: 11.1 g/dL — ABNORMAL LOW (ref 12.0–15.0)
Ketones, urine, test strip: NEGATIVE
Lymphocytes Relative: 8.7 % — ABNORMAL LOW (ref 12.0–46.0)
Lymphs Abs: 0.8 10*3/uL (ref 0.7–4.0)
MCHC: 33.2 g/dL (ref 30.0–36.0)
Monocytes Relative: 7.7 % (ref 3.0–12.0)
Neutro Abs: 7.6 10*3/uL (ref 1.4–7.7)
Platelets: 311 10*3/uL (ref 150.0–400.0)
RDW: 14.3 % (ref 11.5–14.6)
Sodium: 137 meq/L (ref 135–145)
Total Bilirubin: 0.3 mg/dL (ref 0.3–1.2)
Urobilinogen, UA: 0.2
pH: 5

## 2009-07-13 ENCOUNTER — Encounter (INDEPENDENT_AMBULATORY_CARE_PROVIDER_SITE_OTHER): Payer: Self-pay | Admitting: Internal Medicine

## 2009-07-13 ENCOUNTER — Encounter: Payer: Self-pay | Admitting: Internal Medicine

## 2009-07-18 ENCOUNTER — Telehealth: Payer: Self-pay | Admitting: *Deleted

## 2009-07-21 ENCOUNTER — Ambulatory Visit: Payer: Self-pay | Admitting: Internal Medicine

## 2009-07-21 LAB — CONVERTED CEMR LAB
Calcium: 9.5 mg/dL (ref 8.4–10.5)
Chloride: 106 meq/L (ref 96–112)
Creatinine, Ser: 1.2 mg/dL (ref 0.4–1.2)
Sodium: 140 meq/L (ref 135–145)

## 2009-07-26 ENCOUNTER — Ambulatory Visit: Payer: Self-pay | Admitting: Internal Medicine

## 2009-07-26 DIAGNOSIS — D649 Anemia, unspecified: Secondary | ICD-10-CM | POA: Insufficient documentation

## 2009-07-26 LAB — CONVERTED CEMR LAB: Hemoglobin: 11.3 g/dL

## 2009-08-09 ENCOUNTER — Ambulatory Visit: Payer: Self-pay | Admitting: Internal Medicine

## 2009-08-09 ENCOUNTER — Telehealth: Payer: Self-pay | Admitting: Internal Medicine

## 2009-08-09 LAB — CONVERTED CEMR LAB
ALT: 15 units/L (ref 0–35)
Basophils Relative: 0.6 % (ref 0.0–3.0)
Calcium: 9.6 mg/dL (ref 8.4–10.5)
Eosinophils Relative: 4.3 % (ref 0.0–5.0)
GFR calc non Af Amer: 52.41 mL/min (ref >60)
HCT: 32 % — ABNORMAL LOW (ref 36.0–46.0)
Hemoglobin: 10.6 g/dL — ABNORMAL LOW (ref 12.0–15.0)
Lymphocytes Relative: 31.5 % (ref 12.0–46.0)
Magnesium: 1.9 mg/dL (ref 1.5–2.5)
Monocytes Relative: 5.7 % (ref 3.0–12.0)
Neutro Abs: 2.5 10*3/uL (ref 1.4–7.7)
Potassium: 3.7 meq/L (ref 3.5–5.1)
RBC: 3.66 M/uL — ABNORMAL LOW (ref 3.87–5.11)
Saturation Ratios: 26.6 % (ref 20.0–50.0)
Sodium: 143 meq/L (ref 135–145)
Total Bilirubin: 0.4 mg/dL (ref 0.3–1.2)
Transferrin: 177.4 mg/dL — ABNORMAL LOW (ref 212.0–360.0)
Vitamin B-12: 295 pg/mL (ref 211–911)

## 2009-08-14 ENCOUNTER — Ambulatory Visit: Payer: Self-pay | Admitting: Internal Medicine

## 2009-08-22 ENCOUNTER — Telehealth: Payer: Self-pay | Admitting: Internal Medicine

## 2009-08-28 ENCOUNTER — Ambulatory Visit: Payer: Self-pay | Admitting: Internal Medicine

## 2009-08-28 DIAGNOSIS — N63 Unspecified lump in unspecified breast: Secondary | ICD-10-CM | POA: Insufficient documentation

## 2009-08-31 ENCOUNTER — Encounter: Admission: RE | Admit: 2009-08-31 | Discharge: 2009-08-31 | Payer: Self-pay | Admitting: Internal Medicine

## 2009-09-05 ENCOUNTER — Ambulatory Visit: Payer: Self-pay | Admitting: Internal Medicine

## 2009-09-05 LAB — CONVERTED CEMR LAB: Hemoglobin: 12.1 g/dL

## 2009-10-10 ENCOUNTER — Ambulatory Visit: Payer: Self-pay | Admitting: Internal Medicine

## 2009-10-10 DIAGNOSIS — R141 Gas pain: Secondary | ICD-10-CM | POA: Insufficient documentation

## 2009-10-10 DIAGNOSIS — R143 Flatulence: Secondary | ICD-10-CM

## 2009-10-10 DIAGNOSIS — R142 Eructation: Secondary | ICD-10-CM

## 2009-11-10 ENCOUNTER — Ambulatory Visit: Payer: Self-pay | Admitting: Internal Medicine

## 2009-11-10 LAB — CONVERTED CEMR LAB
Eosinophils Relative: 3 % (ref 0.0–5.0)
HCT: 38.8 % (ref 36.0–46.0)
HDL: 75.8 mg/dL (ref >39.00)
Hemoglobin: 13.3 g/dL (ref 12.0–15.0)
LDL Cholesterol: 63 mg/dL (ref 0–99)
Lymphs Abs: 1.3 10*3/uL (ref 0.7–4.0)
Monocytes Relative: 7.5 % (ref 3.0–12.0)
Platelets: 163 10*3/uL (ref 150.0–400.0)
RBC: 4.38 M/uL (ref 3.87–5.11)
Total CHOL/HDL Ratio: 2
Triglycerides: 66 mg/dL (ref 0.0–149.0)
WBC: 4.9 10*3/uL (ref 4.5–10.5)

## 2009-11-14 LAB — CONVERTED CEMR LAB
BUN: 18 mg/dL (ref 6–23)
CO2: 29 meq/L (ref 19–32)
Calcium: 9.6 mg/dL (ref 8.4–10.5)
Chloride: 104 meq/L (ref 96–112)
Creatinine, Ser: 0.7 mg/dL (ref 0.4–1.2)
GFR calc non Af Amer: 84.06 mL/min (ref >60)
Glucose, Bld: 105 mg/dL — ABNORMAL HIGH (ref 70–99)
Potassium: 5 meq/L (ref 3.5–5.1)
Sodium: 141 meq/L (ref 135–145)

## 2009-12-04 ENCOUNTER — Telehealth: Payer: Self-pay | Admitting: *Deleted

## 2009-12-06 ENCOUNTER — Telehealth: Payer: Self-pay | Admitting: *Deleted

## 2009-12-13 ENCOUNTER — Ambulatory Visit: Payer: Self-pay | Admitting: Internal Medicine

## 2010-01-01 ENCOUNTER — Telehealth: Payer: Self-pay | Admitting: *Deleted

## 2010-02-08 ENCOUNTER — Ambulatory Visit: Payer: Self-pay | Admitting: Internal Medicine

## 2010-02-09 LAB — CONVERTED CEMR LAB
Calcium: 10 mg/dL (ref 8.4–10.5)
Creatinine, Ser: 0.9 mg/dL (ref 0.4–1.2)
GFR calc non Af Amer: 69.52 mL/min (ref >60)

## 2010-02-12 ENCOUNTER — Encounter: Payer: Self-pay | Admitting: Internal Medicine

## 2010-02-20 ENCOUNTER — Encounter: Payer: Self-pay | Admitting: Internal Medicine

## 2010-03-08 ENCOUNTER — Telehealth: Payer: Self-pay | Admitting: *Deleted

## 2010-03-10 IMAGING — CT CT PELVIS W/ CM
2 of 5 series · 16 of 46 positions shown, 18 images · IV contrast (agent unspecified)
Comparison: None

CT ABDOMEN

CLINICAL DATA: Right lower quadrant abdominal pain, nausea and
vomiting.

CT ABDOMEN AND PELVIS WITH CONTRAST
TECHNIQUE: Multidetector CT imaging of the abdomen and pelvis was
performed using the standard protocol following bolus
administration of intravenous contrast.
Contrast: 100 ml of Smnipaque-211

[Series 2: rtn a/p with · axial · 0.72mm/px · z∈[-392,-42]mm · 13 of 80 slices shown, 15 images]
[im 5/80  soft-tissue]
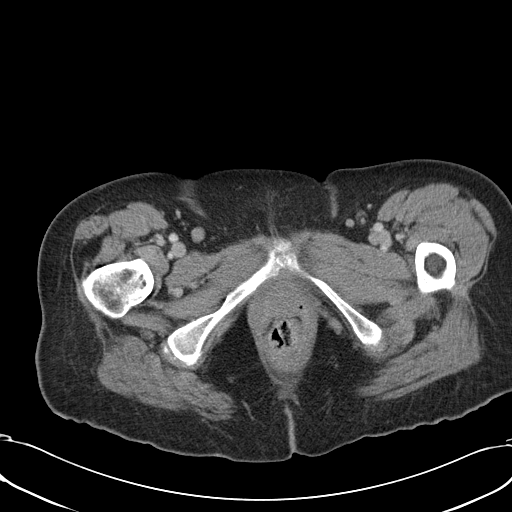
[im 5/80  bone]
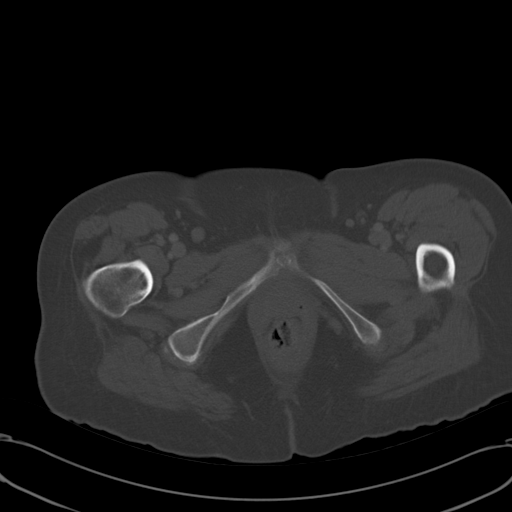
[im 10/80  soft-tissue]
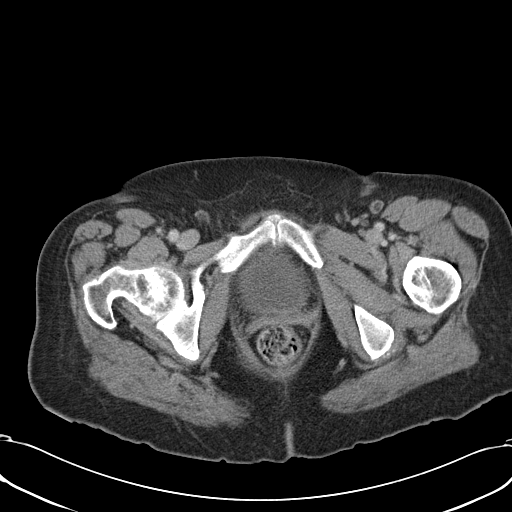
[im 19/80  soft-tissue]
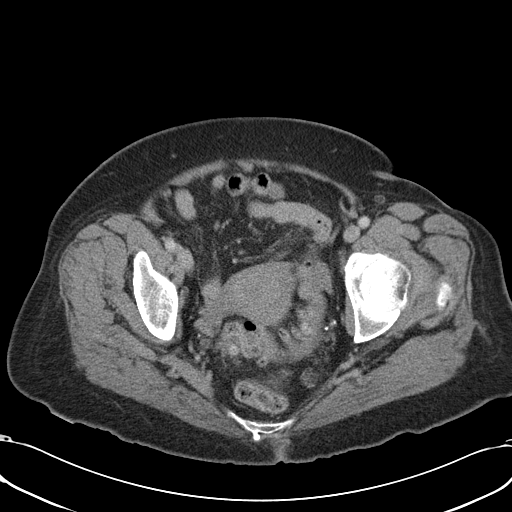
[im 24/80  soft-tissue]
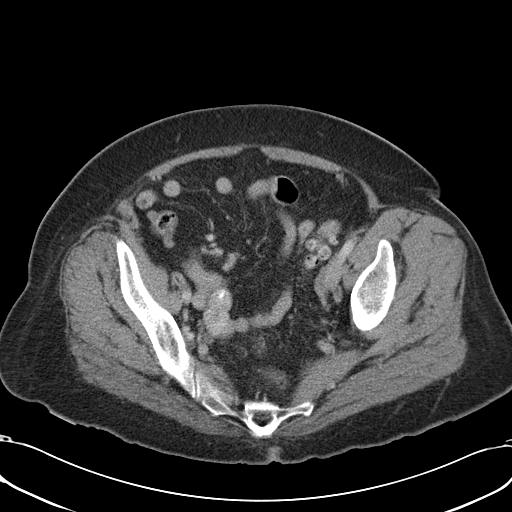
[im 28/80  soft-tissue]
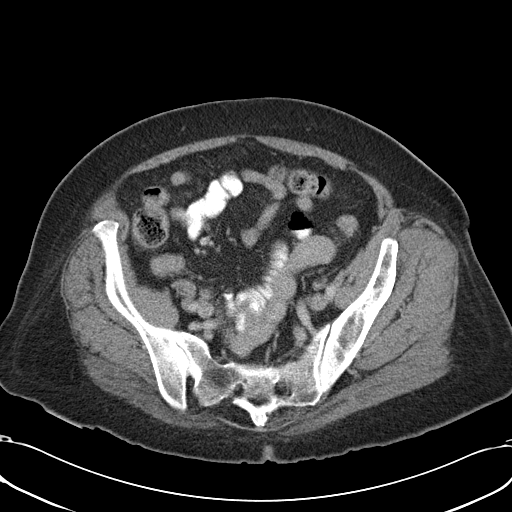
[im 33/80  soft-tissue]
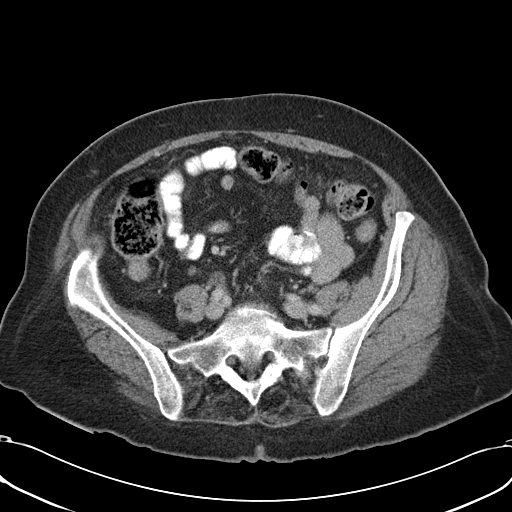
[im 42/80  soft-tissue]
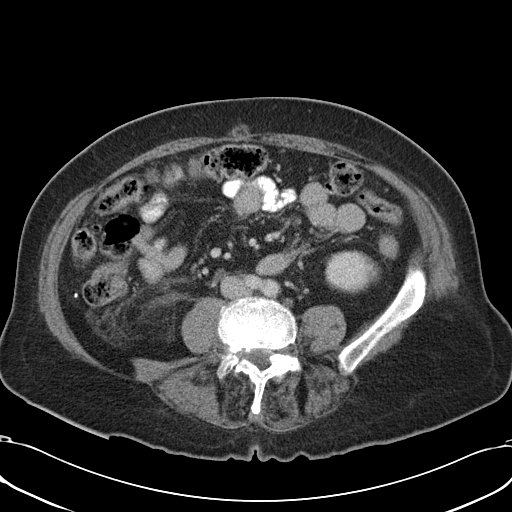
[im 47/80  soft-tissue]
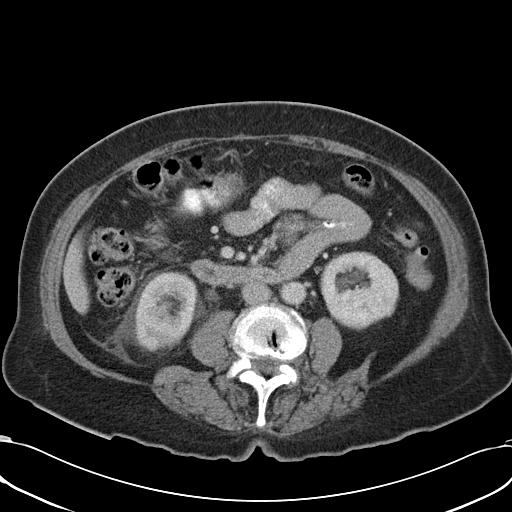
[im 52/80  soft-tissue]
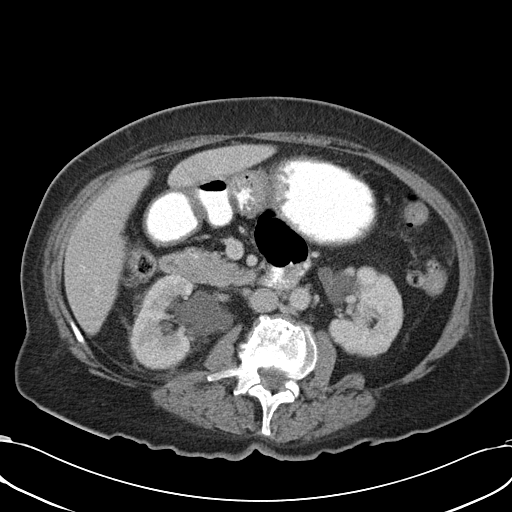
[im 52/80  bone]
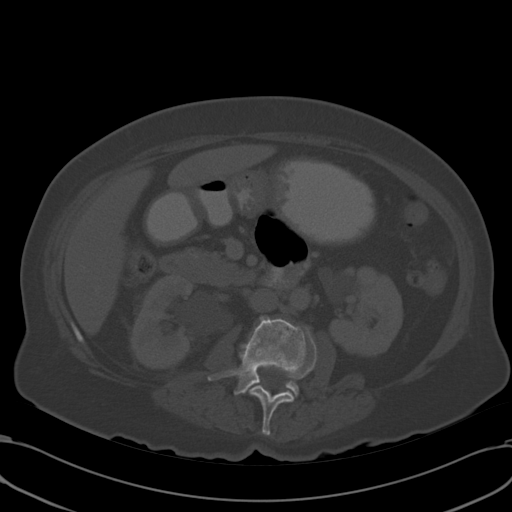
[im 56/80  soft-tissue]
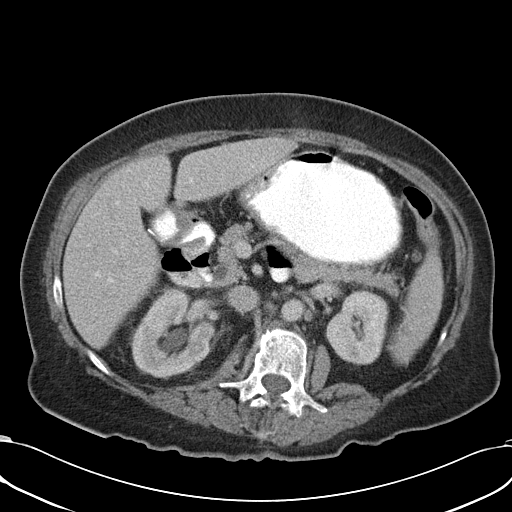
[im 61/80  soft-tissue]
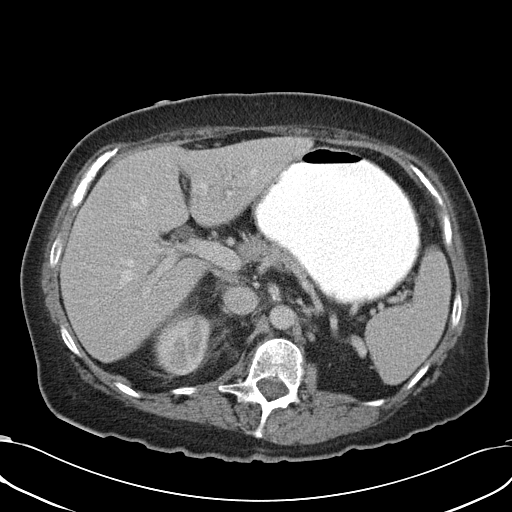
[im 70/80  soft-tissue]
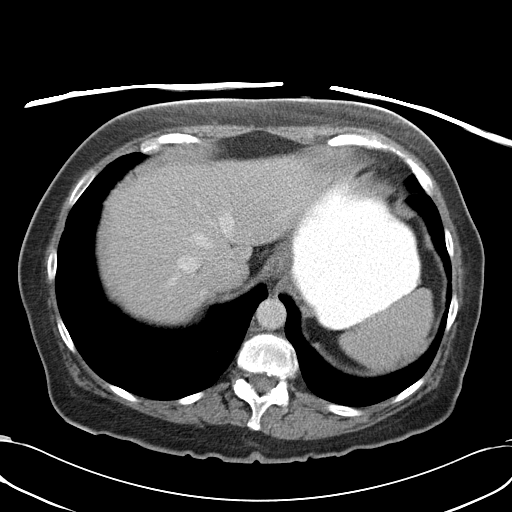
[im 75/80  soft-tissue]
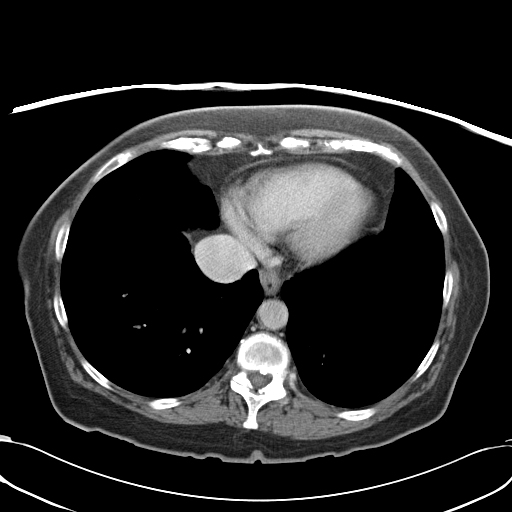

[Series 602: <mpr thick range> · coronal · 0.78mm/px · 3 of 80 slices shown]
[im 27/80  soft-tissue]
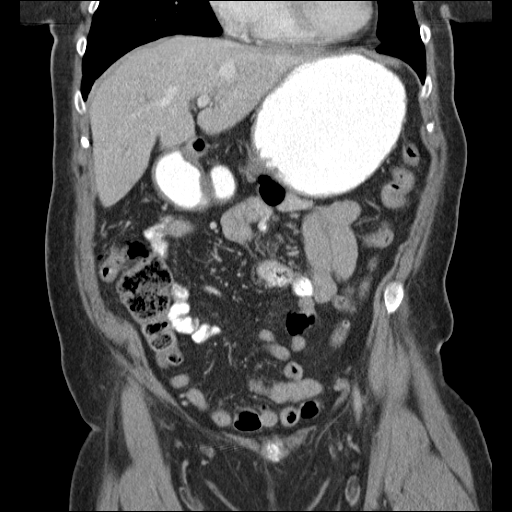
[im 36/80  soft-tissue]
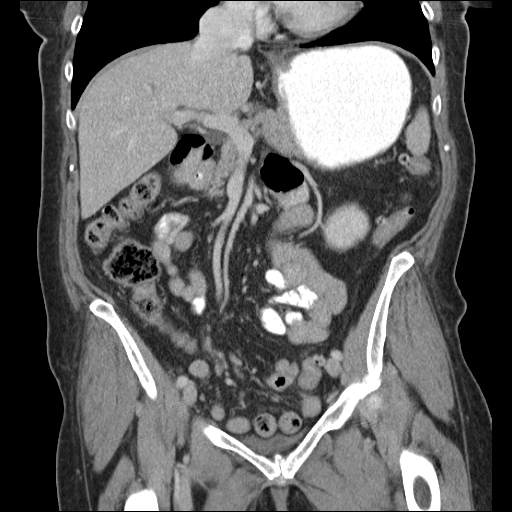
[im 44/80  soft-tissue]
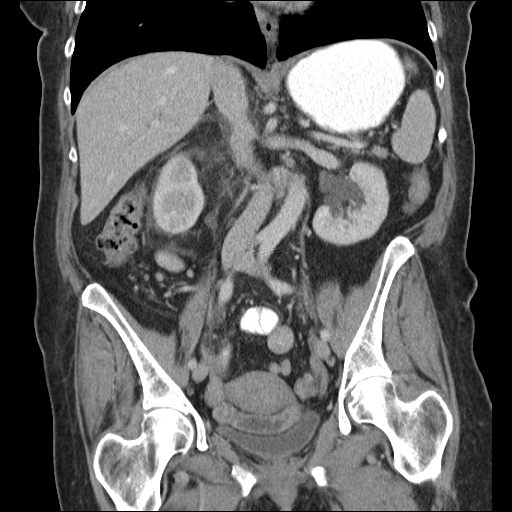

[16 of 46 positions shown; findings below may reference images not displayed]

FINDINGS: The lung bases are clear.  The liver demonstrates mild
central intrahepatic biliary dilatation.  There is also common bile
duct dilatation measuring a maximum of 10.5 mm in the porta hepatis
and 7 mm in the head the pancreas.  This may all be related to a
previous cholecystectomy.  Recommend correlation with liver
function studies.  A duodenal diverticulum is noted also.

The spleen is normal in size.  The pancreas demonstrates no mass
lesions or acute inflammatory change.  The right kidney is
obstructed with moderate to severe hydronephrosis and extensive
perinephric interstitial changes and fluid.  There is also slight
decreased perfusion of the right kidney compared the left.  The
left kidney demonstrates parapelvic cysts but no hydronephrosis.
There is moderate right hydroureter.

The aorta is normal in caliber.  Mild atherosclerotic changes.  The
stomach, duodenum, small bowel and colon unremarkable except for a
duodenal diverticulum and scattered colonic diverticulosis.  No
mesenteric or retroperitoneal masses or adenopathy.

No significant bony findings.  The patient has scoliosis and there
are associated moderate to severe changes of disc disease and facet
disease.
IMPRESSION: 1.  High-grade obstructive findings involving the right kidney. No
renal or upper ureteral calculi.
2.  Status post cholecystectomy with intra and extrahepatic biliary
dilatation which may be within normal limits.  Recommend
correlation with liver function studies.
3.  Parapelvic cysts associated with the left kidney and a
prominent extrarenal pelvis on the left.
4.  Duodenal diverticulum.

CT PELVIS
FINDINGS: There is a 5 x 5 mm distal right ureteral calculus
causing right-sided hydroureteronephrosis.  No bladder calculi.
The left ureter appears normal.

The rectum, sigmoid colon and visualized small bowel loops are
unremarkable.  There is diverticulosis of the sigmoid colon.  The
uterus and ovaries are unremarkable.  No pelvic mass or adenopathy.
There are small scattered nodes.  No inguinal mass or hernia.  The
bony pelvis is intact.
IMPRESSION: 1.  5 x 5 mm right distal ureteral calculus causing the high grade
obstruction of the right ureter and kidney.
2.  Diverticulosis of the sigmoid colon.
3.  No pelvic mass or adenopathy.

## 2010-03-19 ENCOUNTER — Telehealth: Payer: Self-pay | Admitting: *Deleted

## 2010-05-11 ENCOUNTER — Ambulatory Visit: Admit: 2010-05-11 | Payer: Self-pay | Admitting: Internal Medicine

## 2010-05-17 ENCOUNTER — Telehealth: Payer: Self-pay | Admitting: *Deleted

## 2010-05-21 ENCOUNTER — Telehealth: Payer: Self-pay | Admitting: *Deleted

## 2010-05-22 NOTE — Progress Notes (Signed)
Summary: refill on zolpidem   Phone Note From Pharmacy   [facility]er[id number] Reason for Call: Needs renewal Details for Reason: zolpidem 10mg  Summary of Call: last filled on 11/01/09 Initial call taken by: Romualdo Bolk, CMA (AAMA),  January 01, 2010 12:01 PM  Follow-up for Phone Call        ok x  2  Follow-up by: Madelin Headings MD,  January 01, 2010 1:23 PM  Additional Follow-up for Phone Call Additional follow up Details #1::        Rx faxed back to pharmacy. Additional Follow-up by: Romualdo Bolk, CMA (AAMA),  January 01, 2010 4:20 PM    Prescriptions: AMBIEN 10 MG  TABS (ZOLPIDEM TARTRATE) 1 by mouth at bedtime  #30 x 1   Entered by:   Romualdo Bolk, CMA (AAMA)   Authorized by:   Madelin Headings MD   Signed by:   Romualdo Bolk, CMA (AAMA) on 01/01/2010   Method used:   Telephoned to ...       [facility] [address] (retail)       [address]       [place], [place]  [address]       Ph: [phone] or [phone]       Fax: [phone]   RxID:   [id number]

## 2010-05-22 NOTE — Progress Notes (Signed)
Summary: Bp issues-please triage  Phone Note Call from Patient Call back at Home Phone [phone]   Caller: Patient Reason for Call: Referral Summary of Call: Pt called and was released from [facility] on Sunday 07/16/09 for acute Kidney failure. Pts discharge papers says that she needs to sch a BMET within one week. Please order this lab and advise.   Initial call taken by: Lucy Antigua,  July 18, 2009 11:12 AM  Follow-up for Phone Call         Schedule her for  a  BMP either FRiday  April or monday April 4  and then 30 minute ROV  when results back for hospital    follow up.   Dx  acute renal failure.  Follow-up by: Madelin Headings MD,  July 18, 2009 12:24 PM  Additional Follow-up for Phone Call Additional follow up Details #1::        I sch pt for BMP on fri 07/21/09 and ov on wed 07/26/09. Pt has some concerns re: Toprol. Pts bp has been high the last few days. Please call.  Additional Follow-up by: Lucy Antigua,  July 18, 2009 12:55 PM    Additional Follow-up for Phone Call Additional follow up Details #2::    BP:  156/85 since returning home.  Only took it once, but will take it twice daily under normal activities.........and keep lab appt and appt with Dr. Fabian Sharp. Follow-up by: Lynann Beaver CMA,  July 18, 2009 1:19 PM  Additional Follow-up for Phone Call Additional follow up Details #3:: Details for Additional Follow-up Action Taken: call tomorow and if still up we can consider increasing her med Additional Follow-up by: Madelin Headings MD,  July 18, 2009 4:53 PM  Spoke to pt and last night bp 126/77 around 9pm. Pt is going to take it again this am and call us back with the reading. Romualdo Bolk, CMA (AAMA)  July 19, 2009 9:34 AM Pt called  back and BP was 140/80 and P 75. Romualdo Bolk, CMA (AAMA)  July 19, 2009 11:56 AM  Per Dr. Fabian Sharp- Keep doing same meds and monitoring bp. Will discuss more at rov. Romualdo Bolk, CMA (AAMA)  July 19, 2009  1:56 PM LMTOCB Romualdo Bolk, CMA Duncan Dull)  July 19, 2009 3:22 PM Pt aware and states that her bp was 159/83 P75. Pt will bring in her log of bp readings at her next appt. Romualdo Bolk, CMA Duncan Dull)  July 19, 2009 4:33 PM

## 2010-05-22 NOTE — Assessment & Plan Note (Signed)
Summary: discuss labs and continued abd. complaints/dm   Vital Signs:  Patient profile:   70 year old female Menstrual status:  postmenopausal Weight:      139 pounds Pulse rate:   66 / minute BP sitting:   110 / 70  (right arm) Cuff size:   regular  Vitals Entered By: Romualdo Bolk, CMA (AAMA) (August 14, 2009 10:39 AM)  Serial Vital Signs/Assessments:  Time      Position  BP       Pulse  Resp  Temp     By                     124/72                         Madelin Headings MD  Comments: right arm sitting By: Madelin Headings MD   CC: Follow-up visit on labs- Pt is feeling better from the abd. pain. Pt states that she started feeling better over the weekend., Hypertension Management, Pt also wants to discuss going back on cymbalta.   History of Present Illness: [name] [name] comesin comes in today  for  above .follow up of multiple medical problems   Since last visit has had episode of abd pain :see above. <Mood some issue. But no deterioration  .  has not started on cymbalta yet. BP readings   are getting    now in 120 130s arange has a new cuff.   now on lopressor to 25 two times a day and doing ok.  and    amolipine.  HAs are better  and she reports  she  can tell with BP up    OA:   Tried pain med  ocass    tylenol   Had abdominal cramps above pelvis and took tylenol  Anxiety: Lorezepam 1/2 nightly  wants to try cymbalata LIPIDS:Restarted the crestor.    Had 10 days of stomach lower cramping.   better this weekend   no diarrhea   but aggravated after eating  Some may have been aggrivated  by her IBS.   No uti signs . Better now.  Hypertension History:      She denies headache, chest pain, palpitations, dyspnea with exertion, orthopnea, PND, peripheral edema, visual symptoms, neurologic problems, syncope, and side effects from treatment.  She notes no problems with any antihypertensive medication side effects.        Positive major cardiovascular risk factors include female  age 69 years old or older, hyperlipidemia, and hypertension.  Negative major cardiovascular risk factors include non-tobacco-user status.     Preventive Screening-Counseling & Management  Alcohol-Tobacco     Alcohol drinks/day: 0     Smoking Status: never  Caffeine-Diet-Exercise     Caffeine use/day: 1     Does Patient Exercise: yes     Type of exercise: walk  Current Medications (verified): 1)  Crestor 40 Mg Tabs (Rosuvastatin Calcium) .Marland Kitchen.. 1 By Mouth Once Daily 2)  Lorazepam 0.5 Mg Tabs (Lorazepam) .... Take 1 Tablet By Mouth Twice A Day As Needed For Stress 3)  Lopressor 50 Mg  Tabs (Metoprolol Tartrate) .... 1/2 By Mouth Two Times A Day 4)  Ambien 10 Mg  Tabs (Zolpidem Tartrate) .Marland Kitchen.. 1 By Mouth At Bedtime 5)  Amlodipine Besylate 5 Mg  Tabs (Amlodipine Besylate) .... Once Daily 6)  Ketoconazole 2 %  Crea (Ketoconazole) .... Apply Three Times A Day As  Needed 7)  Drisdol 16109 Unit Caps (Ergocalciferol) .Marland Kitchen.. 1 By Mouth Q Week or As Directed 8)  Prilosec 20 Mg Cpdr (Omeprazole) .Marland Kitchen.. 1 By Mouth Two Times A Day  or As Directed 9)  Cymbalta 60 Mg Cpep (Duloxetine Hcl) .Marland Kitchen.. 1 By Mouth Once Daily or As Directed 10)  Tramadol Hcl 50 Mg Tabs (Tramadol Hcl) .Marland Kitchen.. 1 By Mouth Three Times A Day As Needed Pain.  Allergies (verified): 1)  ! * Meloxicam 2)  Sulfamethoxazole (Sulfamethoxazole)  Past History:  Past medical, surgical, family and social histories (including risk factors) reviewed, and no changes noted (except as noted below).  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 osteopenia.   Consults Dr.  Antoine Poche Dr. Danielle Dess Dr. Kellie Simmering Dr. Jarold Motto  Past Surgical History: Reviewed history from 05/02/2008 and no changes required. Cholecystectomy Total knee replacement right benign cyst  2001 "bladder tumor"  2001 head CT neg  08/03 R shoulder tendon tear  08/07 surgery for slipped disk  Past History:  Care Management: Gastroenterology: Dr. Jarold Motto Orthopedics: [facility]: [facility] Urology: 2010  Hosp 3 23 - 3 27  2011acute renal failure  Nephrology consult Dr Darrick Penna.  Family History: Reviewed history from 07/25/2008 and no changes required. Family History High cholesterol Family History Lung cancer father died 57 tobacco REnal cancer son Family History Hypertension bro and moms side Alzheiners mom died from this  DM pgm      No FH of Colon Cancer: Family History of Stomach Cancer:paternal grandfather sister has osteoporis.    Social History: Reviewed history from 05/05/2008 and no changes required. 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         mood weepy at times   but   stable,  Physical Exam  General:  Well-developed,well-nourished,in no acute distress; alert,appropriate and cooperative throughout examination Head:  normocephalic and atraumatic.   Neck:  No deformities, masses, or tenderness noted. Lungs:  Normal respiratory effort, chest expands symmetrically. Lungs are clear to auscultation, no crackles or wheezes. Heart:  Normal rate and regular rhythm. S1 and S2 normal without gallop, murmur, click, rub or other extra sounds. Abdomen:  soft and non-tender.  no distention, no masses, no hepatomegaly, and no splenomegaly.   Msk:  OA changes no acute  redness Pulses:  pulses intact without delay   Extremities:  trace left pedal edema and trace right pedal edema.   Neurologic:  alert & oriented X3, strength normal in all extremities, and gait normal.   Skin:  turgor normal, color normal, no ecchymoses, no petechiae, and no purpura.   Cervical Nodes:  No lymphadenopathy noted Psych:  Oriented X3, normally interactive, good eye contact, not depressed appearing, and slightly anxious.     Impression & Recommendations:  Problem # 1:  HYPERTENSION (ICD-401.9)  Her  updated medication list for this problem includes:    Lopressor 50 Mg Tabs (Metoprolol tartrate) .Marland Kitchen... 1/2 by mouth two times a day    Amlodipine Besylate 5 Mg Tabs (Amlodipine besylate) ..... Once daily  Problem # 2:  ANEMIA (ICD-285.9)  Hgb: 10.6 (08/09/2009)   Hct: 32.0 (08/09/2009)   Platelets: 251.0 (08/09/2009) RBC: 3.66 (08/09/2009)   RDW: 17.3 (08/09/2009)   WBC: 4.3 (08/09/2009) MCV: 87.5 (08/09/2009)   MCHC: 33.3 (08/09/2009) Ferritin: 114.7 (09/26/2008) Iron: 66 (08/09/2009)   % Sat: 26.6 (08/09/2009) B12: 295 (08/09/2009)   Folate: > 20.0 ng/mL (05/05/2008)   TSH: 1.83 (11/09/2008)  Problem # 3:  abdominal cramping  ? viral plus IBS   unclear situationn  Problem # 4:  HYPERLIPIDEMIA (ICD-272.4) restarting med  and will follow Her updated medication list for this problem includes:    Crestor 40 Mg Tabs (Rosuvastatin calcium) .Marland Kitchen... 1 by mouth once daily  Problem # 5:  ADJUSTMENT DISORDER WITH DEPRESSED MOOD (ICD-309.0) ok to try cymbalta   Problem # 6:  OSTEOARTHRITIS (ICD-715.90) some problem off nsaids  can try topicals and tylenol  no nsaids Her updated medication list for this problem includes:    Tramadol Hcl 50 Mg Tabs (Tramadol hcl) .Marland Kitchen... 1 by mouth three times a day as needed pain.  Complete Medication List: 1)  Crestor 40 Mg Tabs (Rosuvastatin calcium) .Marland Kitchen.. 1 by mouth once daily 2)  Lorazepam 0.5 Mg Tabs (Lorazepam) .... Take 1 tablet by mouth  twice a day as needed for stress 3)  Lopressor 50 Mg Tabs (Metoprolol tartrate) .... 1/2 by mouth two times a day 4)  Ambien 10 Mg Tabs (Zolpidem tartrate) .Marland Kitchen.. 1 by mouth at bedtime 5)  Amlodipine Besylate 5 Mg Tabs (Amlodipine besylate) .... Once daily 6)  Ketoconazole 2 % Crea (Ketoconazole) .... Apply three times a day as needed 7)  Drisdol 81191 Unit Caps (Ergocalciferol) .Marland Kitchen.. 1 by mouth q week or as directed 8)  Prilosec 20 Mg Cpdr (Omeprazole) .Marland Kitchen.. 1 by mouth two times a day  or as directed 9)  Cymbalta 60 Mg Cpep (Duloxetine hcl) .Marland Kitchen.. 1 by mouth once daily or as directed 10)  Tramadol Hcl 50 Mg Tabs (Tramadol hcl) .Marland Kitchen.. 1 by mouth three times a day as needed pain.  Hypertension Assessment/Plan:      The patient's hypertensive risk group is category B: At least one risk factor (excluding diabetes) with no target organ damage.  Her calculated 10 year risk of coronary heart disease is 4 %.  Today's blood pressure is 110/70.  Her blood pressure goal is < 140/90.  Patient Instructions: 1)  ok to try the cymbalta   for pain and mood.  2)  can slightly elevated blood pressure readings  and  continue to monitor.  3)  will review your record regarding the anemia.   4)  Your  liver tests  are good today.  5)  No evidence of diabetes.  6)  rov in  3 weeks with BP monitor so we can check this .

## 2010-05-22 NOTE — Assessment & Plan Note (Signed)
Summary: HOSP FOLLOW UP/PER SHANNON/CJR   Vital Signs:  Patient profile:   70 year old female Menstrual status:  postmenopausal Weight:      137 pounds Pulse rate:   66 / minute BP sitting:   140 / 80  (left arm) Cuff size:   regular  Vitals Entered By: Romualdo Bolk, CMA (AAMA) (July 26, 2009 11:07 AM)  Serial Vital Signs/Assessments:  Time      Position  BP       Pulse  Resp  Temp     By                     130/85                         Madelin Headings MD  CC: [facility] Follow up, Hypertension Management-Discuss bp medications   History of Present Illness: [name] [name] comes  in    as follow up for hospitalization for acute renal failure in the setting of nsaid and antihypertensive use.  responded to fluids and dc nsaid and change in meds . No other ob underlying cause and at time of dc Cr had gone frm 7 range to  1.7 range .  She had a low potassiium  in hosp after hydration?   BP readings have increase as off meds  for awhile  .  Potassium    taking  bananas every day.    BP readings  increasing  as  since out.         now on 3/4 dose of med since then  BP 166    and 182 range at times and now 150s .   Not taking cymbalta yet   Prilosec  needed. continuing.  Has many ? s  today NO bleeding currently.  Depression slightly better but not worse and not on SSRI currently.   need refill for anxiety med if can using sparingly  OA  having increase some mpain uses tylenol currently but would like liver rechecked to ensure noproblems there also.    Hypertension History:      She complains of headache and syncope, but denies chest pain, palpitations, dyspnea with exertion, orthopnea, PND, peripheral edema, visual symptoms, neurologic problems, and side effects from treatment.  She notes no problems with any antihypertensive medication side effects.        Positive major cardiovascular risk factors include female age 31 years old or older, hyperlipidemia, and hypertension.   Negative major cardiovascular risk factors include non-tobacco-user status.     Preventive Screening-Counseling & Management  Alcohol-Tobacco     Alcohol drinks/day: 0     Smoking Status: never  Caffeine-Diet-Exercise     Caffeine use/day: 1     Does Patient Exercise: yes     Type of exercise: walk  Current Medications (verified): 1)  Crestor 40 Mg Tabs (Rosuvastatin Calcium) .Marland Kitchen.. 1 By Mouth Once Daily 2)  Lorazepam 0.5 Mg Tabs (Lorazepam) .... Take 1 Tablet By Mouth Twice A Day As Needed For Stress 3)  Lopressor 50 Mg  Tabs (Metoprolol Tartrate) .... 1/4  By Mouth Two Times A Day 4)  Ambien 10 Mg  Tabs (Zolpidem Tartrate) .Marland Kitchen.. 1 By Mouth At Bedtime 5)  Amlodipine Besylate 5 Mg  Tabs (Amlodipine Besylate) .... Once Daily 6)  Ketoconazole 2 %  Crea (Ketoconazole) .... Apply Three Times A Day As Needed 7)  Drisdol 16109 Unit Caps (Ergocalciferol) .Marland KitchenMarland KitchenMarland Kitchen  1 By Mouth Q Week or As Directed 8)  Prilosec 20 Mg Cpdr (Omeprazole) .Marland Kitchen.. 1 By Mouth Two Times A Day  or As Directed 9)  Cymbalta 60 Mg Cpep (Duloxetine Hcl) .Marland Kitchen.. 1 By Mouth Once Daily or As Directed  Allergies (verified): 1)  ! * Meloxicam 2)  Sulfamethoxazole (Sulfamethoxazole)  Past History:  Past medical, surgical, family and social histories (including risk factors) reviewed, and no changes noted (except as noted below).  Past Medical History: Reviewed history from 01/04/2009 and no changes required. 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 osteopenia.   Consults Dr. Antoine Poche Dr. Danielle Dess Dr. Kellie Simmering Dr. Jarold Motto  Past Surgical History: Reviewed history from 05/02/2008 and no changes required. Cholecystectomy Total knee  replacement right benign cyst  2001 "bladder tumor"  2001 head CT neg  08/03 R shoulder tendon tear  08/07 surgery for slipped disk  Past History:  Care Management: Gastroenterology: Dr. Jarold Motto Orthopedics: [facility] Urology: 2010  Hosp 3 23 - 3 27  2011acute renal failure  Nephrology consult Dr Darrick Penna.  Family History: Reviewed history from 07/25/2008 and no changes required. Family History High cholesterol Family History Lung cancer father died 7 tobacco REnal cancer son Family History Hypertension bro and moms side Alzheiners mom died from this  DM pgm      No FH of Colon Cancer: Family History of Stomach Cancer:paternal grandfather sister has osteoporis.    Social History: Reviewed history from 05/05/2008 and no changes required. 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         some head pressure since BP up.    Physical Exam  General:  alert, well-developed, well-nourished, and well-hydrated.   Head:  normocephalic and atraumatic.   Neck:  No deformities, masses, or tenderness noted. Lungs:  Normal respiratory effort, chest expands symmetrically. Lungs are clear to auscultation, no crackles or wheezes. Heart:  Normal rate and regular rhythm. S1 and S2 normal without gallop, murmur, click, rub or other extra sounds. Abdomen:  soft and non-tender.   Msk:  OA changes no acute redness Pulses:  pulses intact without delay   Extremities:  trace left pedal edema and trace right pedal edema.     Neurologic:  alert & oriented X3.   Skin:  turgor normal, color normal, no ecchymoses, no petechiae, and no purpura.   Cervical Nodes:  No lymphadenopathy noted Psych:  Oriented X3, good eye contact, and not anxious appearing.  nnl eye contact  reviewed hopital records and consult and labs .  Impression & Recommendations:  Problem # 1:  ACUTE KIDNEY FAILURE UNSPECIFIED (ICD-584.9) Assessment Improved No nsaids and explanation for above. will have to  monitor cr and controll BP as rec .     Problem # 2:  HYPERTENSION (  ICD-401.9)  up after hydratino and decrease med dose . currenlty plan to increase doese of med.  Her updated medication list for this problem includes:    Lopressor 50 Mg Tabs (Metoprolol tartrate) .Marland Kitchen... 1/2 by mouth two times a day    Amlodipine Besylate 5 Mg Tabs (Amlodipine besylate) ..... Once daily  Orders: Prescription Created Electronically [phone])  Problem # 3:  OSTEOARTHRITIS (ICD-715.90)  The following medications were removed from the medication list:    Meloxicam 15 Mg Tabs (Meloxicam) .Marland Kitchen... 1 by mouth once daily Her updated medication list for this problem includes:    Tramadol Hcl 50 Mg Tabs (Tramadol hcl) .Marland Kitchen... 1 by mouth three times a day as needed pain.  Problem # 4:  ANEMIA (ICD-285.9)  in hosp  was 9.8 will recheck today improved.  Orders: Hgb (85018) Fingerstick (36416)  Hgb: 11.3 (07/26/2009)   Hct: 33.3 (07/12/2009)   Platelets: 311.0 (07/12/2009) RBC: 3.80 (07/12/2009)   RDW: 14.3 (07/12/2009)   WBC: 9.2 (07/12/2009) MCV: 87.8 (07/12/2009)   MCHC: 33.2 (07/12/2009) Ferritin: 114.7 (09/26/2008) Iron: 41 (11/09/2008)   % Sat: 16.7 (11/09/2008) B12: 389 (11/09/2008)   Folate: > 20.0 ng/mL (05/05/2008)   TSH: 1.83 (11/09/2008)  Problem # 5:  ADJ DISORDER WITH MIXED ANXIETY & DEPRESSED MOOD (ICD-309.28) not worse    not adding cymbalta yet because of  bp effects and  she is stable currenlty .  with a trial hope  help with pain also.   disc  meds   Complete Medication List: 1)  Crestor 40 Mg Tabs (Rosuvastatin calcium) .Marland Kitchen.. 1 by mouth once daily 2)  Lorazepam 0.5 Mg Tabs (Lorazepam) .... Take 1 tablet by mouth twice a day as needed for stress 3)  Lopressor 50 Mg Tabs (Metoprolol tartrate) .... 1/2 by mouth two times a day 4)  Ambien 10 Mg Tabs (Zolpidem tartrate) .Marland Kitchen.. 1 by mouth at bedtime 5)  Amlodipine Besylate 5 Mg Tabs (Amlodipine besylate) .... Once daily 6)  Ketoconazole 2 % Crea (Ketoconazole) .... Apply three times a day as needed 7)  Drisdol 60454 Unit Caps (Ergocalciferol) .Marland Kitchen.. 1 by mouth q week or as directed 8)  Prilosec 20 Mg Cpdr (Omeprazole) .Marland Kitchen.. 1 by mouth two times a day  or as directed 9)  Cymbalta 60 Mg Cpep (Duloxetine hcl) .Marland Kitchen.. 1 by mouth once daily or as directed 10)  Tramadol Hcl 50 Mg Tabs (Tramadol hcl) .Marland Kitchen.. 1 by mouth three times a day as needed pain.  Hypertension Assessment/Plan:      The patient's hypertensive risk group is category B: At least one risk factor (excluding diabetes) with no target organ damage.  Her calculated 10 year risk of coronary heart disease is 9 %.  Today's blood pressure is 140/80.  Her blood pressure goal is < 140/90.  Patient Instructions: 1)  Dont take the cymbalata  yet  2)  tylenol for pain. 3)  Can add on ultram if needed for pain.  4)  increase potassium in diet  5)  Increase  lopressor to 1/2 by mouth two times a day   ( ie 25 mg )  6)  Increase the lopressor  7)  recheck  bmp and cbcdiff,   Mg and lfts,  Hg a1c ,  ibc panel and b12 level  in  2 weeks and then return office visit . 8)  dx  Ht renal insufficiency,  anemia  Prescriptions: TRAMADOL HCL 50 MG TABS (TRAMADOL HCL) 1 by mouth three times a day  as needed pain.  #30 x 0   Entered and Authorized by:   Madelin Headings MD   Signed by:   Madelin Headings MD on 07/26/2009   Method used:   Print then Give to Patient   RxID:   [id number] AMBIEN 10 MG  TABS (ZOLPIDEM TARTRATE) 1 by mouth at bedtime  #30 x 2    Entered and Authorized by:   Madelin Headings MD   Signed by:   Madelin Headings MD on 07/26/2009   Method used:   Print then Give to Patient   RxID:   [id number] LORAZEPAM 0.5 MG TABS (LORAZEPAM) Take 1 tablet by mouth twice a day as needed for stress  #60 x 1   Entered and Authorized by:   Madelin Headings MD   Signed by:   Madelin Headings MD on 07/26/2009   Method used:   Print then Give to Patient   RxID:   [id number] AMLODIPINE BESYLATE 5 MG  TABS (AMLODIPINE BESYLATE) once daily  #30 Tablet x 6   Entered and Authorized by:   Madelin Headings MD   Signed by:   Madelin Headings MD on 07/26/2009   Method used:   Electronically to        [facility] [phone][facility] (retail)       [address]       [place], [place]  [address]       Ph: [phone] or [phone]       Fax: [phone]   RxID:   [id number]   Laboratory Results   Blood Tests   Date/Time Recieved: July 26, 2009 11:43 AM  Date/Time Reported: July 26, 2009 11:43 AM    CBC HGB:  11.3 g/dL   (Normal Range: 42.7-06.2 in Males, 12.0-15.0 in Females) Comments: Wynona Canes, CMA  July 26, 2009 11:43 AM

## 2010-05-22 NOTE — Assessment & Plan Note (Signed)
Summary: 1 month rov/njr   Vital Signs:  Patient profile:   70 year old female Menstrual status:  postmenopausal Weight:      142 pounds Pulse rate:   60 / minute BP sitting:   100 / 70  (right arm) Cuff size:   regular  Vitals Entered By: Romualdo Bolk, CMA (AAMA) (October 10, 2009 11:33 AM)  Serial Vital Signs/Assessments:  Time      Position  BP       Pulse  Resp  Temp     By                     106/70   68                    Madelin Headings MD  Comments: right arm   sitting By: Madelin Headings MD   CC: Follow-up visit on cymbalta, Hypertension Management   History of Present Illness: [name] [name] comes in today  for follow up of multiple medical problems . 3 weeks on cymbalta       and doing better Pain improved  sleeping better  Thinking  more clearly. some mood improvement      . BP :  readings  down on cymbalta .   1/2 metoprolol  and amlodipine.   sometimes low but no dizzy or headaches  Sstomach bloating off and on  worries about ovarian cancer some because of warning signs. however no change recently over year s   Hypertension History:      She denies headache, chest pain, palpitations, dyspnea with exertion, orthopnea, PND, peripheral edema, visual symptoms, neurologic problems, syncope, and side effects from treatment.  She notes no problems with any antihypertensive medication side effects.        Positive major cardiovascular risk factors include female age 25 years old or older, hyperlipidemia, and hypertension.  Negative major cardiovascular risk factors include non-tobacco-user status.     Preventive Screening-Counseling & Management  Alcohol-Tobacco     Alcohol drinks/day: 0     Smoking Status: never  Caffeine-Diet-Exercise     Caffeine use/day: 1     Does Patient Exercise: yes     Type of exercise: walk  Current Medications (verified): 1)  Crestor 40 Mg Tabs (Rosuvastatin Calcium) .Marland Kitchen.. 1 By Mouth Once Daily 2)  Lorazepam 0.5 Mg Tabs (Lorazepam)  .... Take 1 Tablet By Mouth Twice A Day As Needed For Stress 3)  Lopressor 50 Mg  Tabs (Metoprolol Tartrate) .... 1/2 By Mouth Two Times A Day 4)  Ambien 10 Mg  Tabs (Zolpidem Tartrate) .Marland Kitchen.. 1 By Mouth At Bedtime 5)  Amlodipine Besylate 5 Mg  Tabs (Amlodipine Besylate) .... Once Daily 6)  Ketoconazole 2 %  Crea (Ketoconazole) .... Apply Three Times A Day As Needed 7)  Drisdol 01601 Unit Caps (Ergocalciferol) .Marland Kitchen.. 1 By Mouth Q Week or As Directed 8)  Prilosec 20 Mg Cpdr (Omeprazole) .Marland Kitchen.. 1 By Mouth Two Times A Day  or As Directed 9)  Cymbalta 60 Mg Cpep (Duloxetine Hcl) .Marland Kitchen.. 1 By Mouth Once Daily or As Directed 10)  Tramadol Hcl 50 Mg Tabs (Tramadol Hcl) .Marland Kitchen.. 1 By Mouth Three Times A Day As Needed Pain.  Allergies (verified): 1)  ! * Meloxicam 2)  Sulfamethoxazole (Sulfamethoxazole)  Past History:  Past medical, surgical, family and social histories (including risk factors) reviewed, and no changes noted (except as noted below).  Past Medical History: Reviewed history  from 08/14/2009 and no changes required. 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 osteopenia.   Consults Dr. Antoine Poche Dr. Danielle Dess Dr. Kellie Simmering Dr. Jarold Motto  Past Surgical History: Reviewed history from 05/02/2008 and no changes required. Cholecystectomy Total knee replacement right benign cyst  2001 "bladder tumor"  2001 head CT neg  08/03 R shoulder tendon tear  08/07 surgery for slipped disk  Past History:  Care Management: Gastroenterology: Dr. Jarold Motto Orthopedics: [facility] Urology: 2010  Hosp 3 23 - 3 27   2011acute renal failure  Nephrology consult Dr Darrick Penna.  Family History: Reviewed history from 07/25/2008 and no changes required. Family History High cholesterol Family History Lung cancer father died 9 tobacco REnal cancer son Family History Hypertension bro and moms side Alzheiners mom died from this  DM pgm      No FH of Colon Cancer: Family History of Stomach Cancer:paternal grandfather sister has osteoporis.    Social History: Reviewed history from 08/14/2009 and no changes required. 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         abd bloating .    Physical Exam  General:  alert, well-developed, well-nourished, and well-hydrated.   Head:  normocephalic and atraumatic.   Eyes:  vision grossly intact.   Neck:  No deformities, masses, or tenderness noted. Lungs:  normal respiratory effort and no intercostal retractions.   Heart:  normal rate and regular rhythm.   see bp repeated normal    Bp confirmed Abdomen:  soft and non-tender.   Msk:  OA changes Pulses:  nl cap refil  pulses intact without delay   Extremities:  oa changes  no clubbing cyanosis or edema  Neurologic:  alert & oriented X3 and gait normal.   Skin:  turgor normal, color normal, no ecchymoses, and no petechiae.   Cervical Nodes:  No lymphadenopathy noted Psych:  Oriented X3, good eye contact, not anxious appearing, and not depressed appearing.  calmer and more comfortable less anxious    Impression & Recommendations:  Problem # 1:  HYPERTENSION (ICD-401.9)  better and lower  after  rx with cymbalta.    Her updated medication list for this problem includes:    Lopressor 50 Mg Tabs (Metoprolol tartrate) .Marland Kitchen...  1/2 by mouth two times a day    Amlodipine Besylate 5 Mg Tabs (Amlodipine besylate) ..... Once daily  Orders: Prescription Created Electronically [phone])  Problem # 2:  FIBROMYALGIA (ICD-729.1) some help with cymbalta.     Her updated medication list for this problem includes:    Tramadol Hcl 50 Mg Tabs (Tramadol hcl) .Marland Kitchen... 1 by mouth three times a day as needed pain.  Problem # 3:  ADJUSTMENT DISORDER WITH DEPRESSED MOOD (ICD-309.0) improvement .    Problem # 4:  ABDOMINAL BLOATING (ICD-787.3) ?  anxiety   disc  her other signs with gi and discharge.     ? had neg scan in hospital but  can reevaluate if persistent or  progressive  sounds like IBS.     Complete Medication List: 1)  Crestor 40 Mg Tabs (Rosuvastatin calcium) .Marland Kitchen.. 1 by mouth once daily 2)  Lorazepam 0.5 Mg Tabs (Lorazepam) .... Take 1 tablet by mouth twice a day as needed for stress 3)  Lopressor 50 Mg Tabs (Metoprolol tartrate) .... 1/2 by mouth two times a day 4)  Ambien 10 Mg Tabs (Zolpidem tartrate) .Marland Kitchen.. 1 by mouth at bedtime 5)  Amlodipine Besylate 5 Mg Tabs (Amlodipine besylate) .... Once daily 6)  Ketoconazole 2 % Crea (Ketoconazole) .... Apply three times a day as needed 7)  Drisdol 44034 Unit Caps (Ergocalciferol) .Marland Kitchen.. 1 by mouth q week or as directed 8)  Prilosec 20 Mg Cpdr (Omeprazole) .Marland Kitchen.. 1 by mouth two times a day  or as directed 9)  Cymbalta 60 Mg Cpep (Duloxetine hcl) .Marland Kitchen.. 1 by mouth once daily or as directed 10)  Tramadol Hcl 50 Mg Tabs (Tramadol hcl) .Marland Kitchen.. 1 by mouth three times a day as needed pain.  Hypertension Assessment/Plan:      The patient's hypertensive risk group is category B: At least one risk factor (excluding diabetes) with no target organ damage.  Her calculated 10 year risk of coronary heart disease is 4 %.  Today's blood pressure is 100/70.  Her blood pressure goal is < 140/90.  Patient Instructions: 1)  continue   same medication. cymbalta  2)  call if  light headed or if BP below 100  on a regular basis. 3)  Please schedule a follow-up appointment in 1 month.   4)  We may do labs at that point.  Prescriptions: LOPRESSOR 50 MG  TABS (METOPROLOL TARTRATE) 1/2 by mouth two times a day  #30 x 6   Entered and Authorized by:   Madelin Headings MD   Signed by:   Madelin Headings MD on 10/10/2009   Method used:   [facility] to        [phone][facility] ([facility]ail)       [address]       [place], [place]  [address]       Ph: [phone] or [phone]       Fax: [phone]   RxID:   [id number]

## 2010-05-22 NOTE — Assessment & Plan Note (Signed)
Summary: chills and stomach pain/ssc   Vital Signs:  Patient profile:   70 year old female Menstrual status:  postmenopausal Weight:      140 pounds Temp:     97.5 degrees F oral Pulse rate:   78 / minute BP sitting:   110 / 70  (right arm) Cuff size:   regular  Vitals Entered By: Romualdo Bolk, CMA Duncan Dull) (July 12, 2009 2:56 PM) CC: Chills, sweats, stomach pain, nausea that started on 3/17, food hurts her stomach.   History of Present Illness: [name] [name] comesin today for   for acute SDA for above .  Onset a week ago with chiils    then stomach discomfort and severe    nausea but no vomiting.     Doesnt want to eat but doing fluids ok.  one night 3 days ago had sweats after chills   tool asa last pm. No more sweats  but  feels very tired and nauseous. No diarrhea some dec MBs and dec Urine Output .  has back pain she calls kidney pain. No cough blood in stool or swollen glands but neck sore at times . No new meds but took an ASA last night.  Preventive Screening-Counseling & Management  Alcohol-Tobacco     Alcohol drinks/day: 0     Smoking Status: never  Caffeine-Diet-Exercise     Caffeine use/day: 1     Does Patient Exercise: yes     Type of exercise: walk  Current Medications (verified): 1)  Crestor 40 Mg Tabs (Rosuvastatin Calcium) .Marland Kitchen.. 1 By Mouth Once Daily 2)  Lorazepam 0.5 Mg Tabs (Lorazepam) .... Take 1 Tablet By Mouth Twice A Day As Needed For Stress 3)  Meloxicam 15 Mg Tabs (Meloxicam) .Marland Kitchen.. 1 By Mouth Once Daily 4)  Lopressor 50 Mg  Tabs (Metoprolol Tartrate) .Marland Kitchen.. 1 By Mouth Two Times A Day 5)  Ambien 10 Mg  Tabs (Zolpidem Tartrate) .Marland Kitchen.. 1 By Mouth At Bedtime 6)  Amlodipine Besylate 5 Mg  Tabs (Amlodipine Besylate) .... Once Daily 7)  Ketoconazole 2 %  Crea (Ketoconazole) .... Apply Three Times A Day As Needed 8)  Drisdol 16109 Unit Caps (Ergocalciferol) .Marland Kitchen.. 1 By Mouth Q Week or As Directed 9)  Prilosec 20 Mg Cpdr (Omeprazole) .Marland Kitchen.. 1 By Mouth Two Times A  Day  or As Directed 10)  Cymbalta 60 Mg Cpep (Duloxetine Hcl) .Marland Kitchen.. 1 By Mouth Once Daily or As Directed 11)  Hydrocodone-Homatropine 5-1.5 Mg/63ml Syrp (Hydrocodone-Homatropine) .Marland Kitchen.. 1-2 Tsp By Mouth Q 4-6 Hours As Needed Coudh 12)  Benzonatate 100 Mg Caps (Benzonatate) .Marland Kitchen.. 1 By Mouth Three Times A Day As Needed Cough  As Needed  Allergies (verified): 1)  Sulfamethoxazole (Sulfamethoxazole)  Past History:  Past medical, surgical, family and social histories (including risk factors) reviewed, and no changes noted (except as noted below).  Past Medical History: Reviewed history from 01/04/2009 and no changes required. 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 osteopenia.   Consults Dr. Antoine Poche Dr. Danielle Dess Dr. Kellie Simmering Dr. Jarold Motto  Past Surgical History: Reviewed history from 05/02/2008 and no changes required. Cholecystectomy Total knee replacement right  benign cyst  2001 "bladder tumor"  2001 head CT neg  08/03 R shoulder tendon tear  08/07 surgery for slipped disk  Past History:  Care Management: Gastroenterology: Dr. Jarold Motto [facility]opedics: [facility] Urology: 2010  Family History: Reviewed history from 07/25/2008 and no changes required. Family History High cholesterol Family History Lung cancer father died 4 tobacco REnal cancer son Family History Hypertension bro and moms side Alzheiners mom died from this  DM pgm      No FH of Colon Cancer: Family History of Stomach Cancer:paternal grandfather sister has osteoporis.    Social History: Reviewed history from 05/05/2008 and no changes required. 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  The patient denies weight gain, vision loss, decreased hearing, chest pain, syncope, dyspnea on exertion, peripheral edema, prolonged cough, hemoptysis, melena, hematochezia, severe indigestion/heartburn, hematuria, abnormal bleeding, and enlarged lymph nodes.         takes  meloxicam q d for  arthritis.      rest of ros  neg or [place]   Physical Exam  General:  mildly ill in nad  moves slowly  Head:  normocephalic and atraumatic.   Eyes:  clear  no discharge Ears:  R ear normal and L ear normal.   Mouth:  Oral mucosa and oropharynx without lesions or exudates.  Teeth in good repair. moist  Neck:  No deformities, masses, or tenderness noted. Lungs:  Normal respiratory effort, chest expands symmetrically. Lungs are clear to auscultation, no crackles or wheezes.no dullness.   Heart:  Normal rate and regular rhythm. S1 and S2 normal without gallop, murmur, click, rub or other extra sounds.no lifts.   Abdomen:  soft, normal bowel sounds, no distention, no masses, no inguinal hernia, no hepatomegaly, and no splenomegaly.  mild tenderness  left gutter but no rebound  no flank pain  Msk:  OA changes  Pulses:  pulses intact without delay   Extremities:  no clubbing cyanosis or edema  Neurologic:  alert & oriented X3, strength normal in all extremities, and gait normal.   Skin:  turgor normal and color normal.  looks somewhat pale and wan no bleeding or rashes Cervical Nodes:  No lymphadenopathy noted Axillary Nodes:  No palpable lymphadenopathy Psych:  Oriented X3, good eye contact, not anxious appearing, and not depressed appearing.     Impression & Recommendations:  Problem # 1:  NAUSEA (ICD-787.02) for 1 weeks with chills and sweats although not now .   probable fever  Orders: UA Dipstick w/o Micro (automated)  (81003) TLB-Hepatic/Liver  Function Pnl (80076-HEPATIC) TLB-BMP (Basic Metabolic Panel-BMET) (80048-METABOL) TLB-CBC Platelet - w/Differential (85025-CBCD)  Problem # 2:  ABDOMINAL PAIN OTHER SPECIFIED SITE (ICD-789.09) HArd to localize but hasnt been eating   but novomiting and liquids without problem.   see weight loss  10 pounds in 3 months  unclear if significant.  Her updated medication list for this problem includes:    Meloxicam 15 Mg Tabs (Meloxicam) .Marland Kitchen... 1 by mouth once daily  Orders: UA Dipstick w/o Micro (automated)  (81003) TLB-Hepatic/Liver Function Pnl (80076-HEPATIC) TLB-BMP (Basic Metabolic Panel-BMET) (80048-METABOL) TLB-CBC Platelet - w/Differential (85025-CBCD) T-Culture, Urine (16109-60454) Specimen Handling (09811)  Problem # 3:  ACUTE KIDNEY FAILURE UNSPECIFIED (ICD-584.9)  see labs    creatinine   6.1   with  protein and blood in urine on DS.      some could be prerenal as she is not taking much by mouth also    Poss from her nsaid or other . needs admission and evaluation.  previous creatinine .9   Complete Medication List: 1)  Crestor 40 Mg Tabs (Rosuvastatin calcium) .Marland Kitchen.. 1 by mouth once daily 2)  Lorazepam 0.5 Mg Tabs (Lorazepam) .... Take 1 tablet by mouth twice a day as needed for stress 3)  Meloxicam 15 Mg Tabs (Meloxicam) .Marland Kitchen.. 1 by mouth once daily 4)  Lopressor 50 Mg Tabs (Metoprolol tartrate) .Marland Kitchen.. 1 by mouth two times a day 5)  Ambien 10 Mg Tabs (Zolpidem tartrate) .Marland Kitchen.. 1 by mouth at bedtime 6)  Amlodipine Besylate 5 Mg Tabs (Amlodipine besylate) .... Once daily 7)  Ketoconazole 2 % Crea (Ketoconazole) .... Apply three times a day as needed 8)  Drisdol 54098 Unit Caps (Ergocalciferol) .Marland Kitchen.. 1 by mouth q week or as directed 9)  Prilosec 20 Mg Cpdr (Omeprazole) .Marland Kitchen.. 1 by mouth two times a day  or as directed 10)  Cymbalta 60 Mg Cpep (Duloxetine hcl) .Marland Kitchen.. 1 by mouth once daily or as directed 11)  Hydrocodone-homatropine 5-1.5 Mg/54ml Syrp (Hydrocodone-homatropine) .Marland Kitchen.. 1-2 tsp by  mouth q 4-6 hours as needed coudh 12)  Benzonatate 100 Mg Caps (Benzonatate) .Marland Kitchen.. 1 by mouth three times a day as needed cough  as needed After   above done   patient given by mouth fluids to get  UA sample   In the mean time  labs back with creatinine of 6.1 and BUN  56     this is consistent with acute renal failure.  and rec hospital admission Laboratory Results   Urine Tests    Routine Urinalysis   Color: yellow Appearance: Cloudy Glucose: negative   (Normal Range: Negative) Bilirubin: 1+   (Normal Range: Negative) Ketone: negative   (Normal Range: Negative) Spec. Gravity: 1.015   (Normal Range: 1.003-1.035) Blood: 2+   (Normal Range: Negative) pH: 5.0   (Normal Range: 5.0-8.0) Protein: 2+   (Normal Range: Negative) Urobilinogen: 0.2   (Normal Range: 0-1) Nitrite: negative   (Normal Range: Negative) Leukocyte Esterace: trace   (Normal Range: Negative)    Comments: Rita Ohara  July 12, 2009 5:00 PM

## 2010-05-22 NOTE — Assessment & Plan Note (Signed)
Summary: 1 mo rov/mm   Vital Signs:  Patient profile:   70 year old female Menstrual status:  postmenopausal Weight:      148 pounds Temp:     98.2 degrees F oral Pulse rate:   72 / minute BP sitting:   100 / 60  (right arm) Cuff size:   regular  Vitals Entered By: Romualdo Bolk, CMA (AAMA) (November 10, 2009 11:26 AM)  Serial Vital Signs/Assessments:  Time      Position  BP       Pulse  Resp  Temp     By                     112/64                         Madelin Headings MD  CC: Follow-up visit , Hypertension Management, pt is having itchy watery eyes, ? pinky eyes vs allergy eyes, sore throat this started on 7/21, sinus drainage   History of Present Illness: [name] [name] comes in today  for follow up of medical problems  Pain and depression: Feels better on cymbalta.  helps in head  and thinking and depression    .   initially causing   sleepiness in pm but better now.  Has helped the pain some.   taking ultram ocass. NO bleeding.  HT:  good readings   in the 100 -110 range . no dizziness HAs  or sob edema or cp.  Anemia :  o bleeding or Gi distress.  Has had a few days of st and no fever   irritated eye no vision change ? if allergy.. has eye drops to use. no  cloudy discharge  or swelling.  NO sig itch.  Hypertension History:      She denies headache, chest pain, palpitations, dyspnea with exertion, orthopnea, PND, peripheral edema, visual symptoms, neurologic problems, syncope, and side effects from treatment.  She notes no problems with any antihypertensive medication side effects.        Positive major cardiovascular risk factors include female age 58 years old or older, hyperlipidemia, and hypertension.  Negative major cardiovascular risk factors include non-tobacco-user status.     Preventive Screening-Counseling & Management  Alcohol-Tobacco     Alcohol drinks/day: 0     Smoking Status: never  Caffeine-Diet-Exercise     Caffeine use/day: 1     Does Patient  Exercise: yes     Type of exercise: walk  Current Medications (verified): 1)  Crestor 40 Mg Tabs (Rosuvastatin Calcium) .Marland Kitchen.. 1 By Mouth Once Daily 2)  Lorazepam 0.5 Mg Tabs (Lorazepam) .... Take 1 Tablet By Mouth Twice A Day As Needed For Stress 3)  Lopressor 50 Mg  Tabs (Metoprolol Tartrate) .... 1/2 By Mouth Two Times A Day 4)  Ambien 10 Mg  Tabs (Zolpidem Tartrate) .Marland Kitchen.. 1 By Mouth At Bedtime 5)  Amlodipine Besylate 5 Mg  Tabs (Amlodipine Besylate) .... Once Daily 6)  Ketoconazole 2 %  Crea (Ketoconazole) .... Apply Three Times A Day As Needed 7)  Drisdol 73532 Unit Caps (Ergocalciferol) .Marland Kitchen.. 1 By Mouth Q Week or As Directed 8)  Prilosec 20 Mg Cpdr (Omeprazole) .Marland Kitchen.. 1 By Mouth Two Times A Day  or As Directed 9)  Cymbalta 60 Mg Cpep (Duloxetine Hcl) .Marland Kitchen.. 1 By Mouth Once Daily or As Directed 10)  Tramadol Hcl 50 Mg Tabs (Tramadol Hcl) .Marland Kitchen.. 1 By Mouth  Three Times A Day As Needed Pain.  Allergies (verified): 1)  ! * Meloxicam 2)  Sulfamethoxazole (Sulfamethoxazole)  Past History:  Past medical, surgical, family and social histories (including risk factors) reviewed for relevance to current acute and chronic problems.  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 osteopenia.   Consults Dr. Antoine Poche Dr. Danielle Dess Dr. Kellie Simmering Dr. Jarold Motto  Past Surgical History: Reviewed history from 05/02/2008 and no changes required. Cholecystectomy Total knee replacement right benign cyst  2001 "bladder tumor"  2001 head CT neg  08/03 R shoulder tendon tear  08/07 surgery for slipped disk  Past  History:  Care Management: Gastroenterology: Dr. Jarold Motto [facility]opedics: [facility]: [facility] Urology: 2010  Hosp 3 23 - 3 27  2011acute renal failure  Nephrology consult Dr Darrick Penna.  Family History: Reviewed history from 07/25/2008 and no changes required. Family History High cholesterol Family History Lung cancer father died 7 tobacco REnal cancer son Family History Hypertension bro and moms side Alzheiners mom died from this  DM pgm      No FH of Colon Cancer: Family History of Stomach Cancer:paternal grandfather sister has osteoporis.    Social History: Reviewed history from 08/14/2009 and no changes required. 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    Physical Exam  General:  Well-developed,well-nourished,in no acute distress; alert,appropriate and cooperative throughout examination Head:  normocephalic and atraumatic.   Eyes:  minimal irritation of eyes no discharege  Nose:  no external deformity and no external erythema.   Mouth:  slight erythema laterally  Neck:  No deformities, masses, or tenderness noted. Lungs:  normal respiratory effort and no intercostal retractions.   Heart:  normal rate, regular rhythm, and no murmur.  rate 68 Skin:  turgor normal, color normal, no ecchymoses, and no petechiae.   Cervical Nodes:  No lymphadenopathy noted Psych:  Oriented X3, memory intact for recent and remote, normally interactive, good eye contact, not anxious appearing, and subdued.  nl speech    Impression & Recommendations:  Problem # 1:  HYPERTENSION (ICD-401.9)  good  no apparent se of meds Her updated medication  list for this problem includes:    Lopressor 50 Mg Tabs (Metoprolol tartrate) .Marland Kitchen... 1/2 by mouth two times a day    Amlodipine Besylate 5 Mg Tabs (Amlodipine besylate) ..... Once daily  Orders: TLB-BMP (Basic Metabolic Panel-BMET) (80048-METABOL)  BP today: 100/60 Prior BP: 100/70 (10/10/2009)  Prior 10 Yr Risk Heart Disease: 4 % (10/10/2009)  Labs Reviewed: K+: 3.7 (08/09/2009) Creat: : 1.1 (08/09/2009)   Chol: 113 (07/15/2008)   HDL: 54.20 (07/15/2008)   LDL: 48 (07/15/2008)   TG: 53.0 (07/15/2008)  Problem # 2:  ADJUSTMENT DISORDER WITH DEPRESSED MOOD (ICD-309.0) Assessment: Improved  on cymblatlta  Problem # 3:  OSTEOARTHRITIS (ICD-715.90) pain some better on cymbalta .   only taking tramadol   ocass at night    disc risk of bleed risk and  stay off asa for now.  Her updated medication list for this problem includes:    Tramadol Hcl 50 Mg Tabs (Tramadol hcl) .Marland Kitchen... 1 by mouth three times a day as needed pain.  Problem # 4:  ANEMIA (ICD-285.9)  recheck today  Orders: TLB-CBC Platelet - w/Differential (85025-CBCD)  Hgb: 12.1 (09/05/2009)   Hct: 32.0 (08/09/2009)   Platelets: 251.0 (08/09/2009) RBC: 3.66 (08/09/2009)   RDW: 17.3 (08/09/2009)   WBC: 4.3 (08/09/2009) MCV: 87.5 (08/09/2009)   MCHC: 33.3 (08/09/2009) Ferritin: 114.7 (09/26/2008) Iron: 66 (08/09/2009)   % Sat: 26.6 (08/09/2009) B12: 295 (08/09/2009)   Folate: > 20.0 ng/mL (05/05/2008)   TSH: 1.83 (11/09/2008)  Problem # 5:  CONJUNCTIVITIS (ICD-372.30)  poss allergy vs viral    no evidence of bacterial infection at present .Marland Kitchen  Discussed treatment options.   use antihistamine  rx for allergy    call if persistent or  progressive    Problem # 6:  HYPERLIPIDEMIA (ICD-272.4)  Her updated medication list for this problem includes:    Crestor 40 Mg Tabs (Rosuvastatin calcium) .Marland Kitchen... 1 by mouth once daily  Labs Reviewed: SGOT: 22 (08/09/2009)   SGPT: 15 (08/09/2009)  Prior 10 Yr Risk Heart Disease: 4 %  (10/10/2009)   HDL:54.20 (07/15/2008), 55.0 (10/09/2007)  LDL:48 (07/15/2008), DEL (56/43/3295)  Chol:113 (07/15/2008), 259 (10/09/2007)  Trig:53.0 (07/15/2008), 91 (10/09/2007)  Problem # 7:  VITAMIN D DEFICIENCY (ICD-268.9) recheck labs   Complete Medication List: 1)  Crestor 40 Mg Tabs (Rosuvastatin calcium) .Marland Kitchen.. 1 by mouth once daily 2)  Lorazepam 0.5 Mg Tabs (Lorazepam) .... Take 1 tablet by mouth twice a day as needed for stress 3)  Lopressor 50 Mg Tabs (Metoprolol tartrate) .... 1/2 by mouth two times a day 4)  Ambien 10 Mg Tabs (Zolpidem tartrate) .Marland Kitchen.. 1 by mouth at bedtime 5)  Amlodipine Besylate 5 Mg Tabs (Amlodipine besylate) .... Once daily 6)  Ketoconazole 2 % Crea (Ketoconazole) .... Apply three times a day as needed 7)  Drisdol 18841 Unit Caps (Ergocalciferol) .Marland Kitchen.. 1 by mouth q week or as directed 8)  Prilosec 20 Mg Cpdr (Omeprazole) .Marland Kitchen.. 1 by mouth two times a day  or as directed 9)  Cymbalta 60 Mg Cpep (Duloxetine hcl) .Marland Kitchen.. 1 by mouth once daily or as directed 10)  Tramadol Hcl 50 Mg Tabs (Tramadol hcl) .Marland Kitchen.. 1 by mouth three times a day as needed pain. 11)  Cymbalta 30 Mg Cpep (Duloxetine hcl) .Marland Kitchen.. 1 by mouth once daily  Other Orders: T-Vitamin D (25-Hydroxy) [phone])  Hypertension Assessment/Plan:      The patient's hypertensive risk group is category B: At least one risk factor (excluding diabetes) with no target organ damage.  Her calculated 10 year risk of coronary heart disease is 4 %.  Today's blood pressure is 100/60.  Her blood pressure goal is < 140/90.  Patient Instructions: 1)  increase to 90 mg of cymbalta  ( 30 + 60)  2)  You will be informed of lab results when available.  3)  Caution with bleeding risk with ultram and cymbalta . 4)  Also can increase drug level of the metoprolol  so   call if  having a problem. 5)  Please schedule a follow-up appointment in 1 month.

## 2010-05-22 NOTE — Progress Notes (Signed)
Summary: refill on lorazepam  Phone Note From Pharmacy   Caller: [name] Reason for Call: Needs renewal Details for Reason: lorazepam 0.5mg   Summary of Call: last filled on 11/01/09 #60- Emergency refill Initial call taken by: Romualdo Bolk, CMA (AAMA),  December 06, 2009 10:51 AM  Follow-up for Phone Call        ok to refill x 1  has appt next week Follow-up by: Madelin Headings MD,  December 06, 2009 8:11 PM  Additional Follow-up for Phone Call Additional follow up Details #1::        Rx faxed to pharmacy Additional Follow-up by: Romualdo Bolk, CMA Duncan Dull),  December 07, 2009 8:54 AM    Prescriptions: LORAZEPAM 0.5 MG TABS (LORAZEPAM) Take 1 tablet by mouth twice a day as needed for stress  #60 x 0   Entered by:   Romualdo Bolk, CMA (AAMA)   Authorized by:   Madelin Headings MD   Signed by:   Romualdo Bolk, CMA (AAMA) on 12/07/2009   Method used:   Handwritten   RxID:   [id number]

## 2010-05-22 NOTE — Progress Notes (Signed)
Summary: refill on zolpidem  Phone Note From Pharmacy   Caller: [id number]* Reason for Call: Needs renewal Details for Reason: zolpidem 10mg  Summary of Call: last filled on 02/07/2010 #30 Initial call taken by: Romualdo Bolk, CMA (AAMA),  March 08, 2010 9:53 AM  Follow-up for Phone Call        ok x 2  Follow-up by: Madelin Headings MD,  March 08, 2010 11:22 AM  Additional Follow-up for Phone Call Additional follow up Details #1::        Rx faxed to pharmacy Additional Follow-up by: Romualdo Bolk, CMA Duncan Dull),  March 08, 2010 12:33 PM    Prescriptions: AMBIEN 10 MG  TABS (ZOLPIDEM TARTRATE) 1 by mouth at bedtime  #30 x 1   Entered by:   Romualdo Bolk, CMA (AAMA)   Authorized by:   Madelin Headings MD   Signed by:   Romualdo Bolk, CMA (AAMA) on 03/08/2010   Method used:   Handwritten   RxID:   [id number]

## 2010-05-22 NOTE — Progress Notes (Signed)
Summary: wants to speak to shannon  [facility] 08/09/2009  Phone Note Call from Patient Call back at Home Phone [phone] Call back at [phone]   Caller: Patient----walk in  Summary of Call: Just had labs this morning and would like for shannon to call her regarding kidney failure issues. was in hosp 1 month ago. Has appt next week. having stomach cramps since last week. please advise of an earlier appt. wants shannon to call her back. Initial call taken by: Warnell Forester,  August 09, 2009 10:55 AM  Follow-up for Phone Call        [facility] Follow-up by: Lynann Beaver CMA,  August 09, 2009 11:39 AM  Additional Follow-up for Phone Call Additional follow up Details #1::        Spoke to pt, she is concerned about her labs, and is having some residual cramping in abdomen after eating.  Will come earlier next week to talk to Dr. Fabian Sharp. Additional Follow-up by: Lynann Beaver CMA,  August 10, 2009 10:38 AM    Additional Follow-up for Phone Call Additional follow up Details #2::    tell patient that kidney function is stable  .  her anemia is minimally worse  .    will discuss next week. Follow-up by: Madelin Headings MD,  August 10, 2009 12:26 PM  Additional Follow-up for Phone Call Additional follow up Details #3:: Details for Additional Follow-up Action Taken: Pt has appt Monday with Dr. Fabian Sharp. Additional Follow-up by: Lynann Beaver CMA,  August 10, 2009 1:53 PM

## 2010-05-22 NOTE — Assessment & Plan Note (Signed)
Summary: breast mass?/dm   Vital Signs:  Patient profile:   70 year old female Menstrual status:  postmenopausal Weight:      139 pounds Pulse rate:   60 / minute BP sitting:   110 / 60  (right arm) Cuff size:   regular  Vitals Entered By: Romualdo Bolk, CMA (AAMA) (Aug 28, 2009 2:05 PM)  Serial Vital Signs/Assessments:  Time      Position  BP       Pulse  Resp  Temp     By                     118/70                         Madelin Headings MD  Comments: right arm sitting By: Madelin Headings MD   CC: Left Breast Mass   History of Present Illness: [name] [name]  comes in today  for above reason.   See phone note .  Had some itching of nipple and then felt    a lump    4 months ago , Unsure if old as breast exam has changed since losing weight.  No change ? from  then  .    Itching stopped  after  bout a week.  No discharee or rash.    Last mammo  was June 2009  .   Remote hx of  something that needed to be followed in  the left breast.   Preventive Screening-Counseling & Management  Alcohol-Tobacco     Alcohol drinks/day: 0     Smoking Status: never  Caffeine-Diet-Exercise     Caffeine use/day: 1     Does Patient Exercise: yes     Type of exercise: walk  Current Medications (verified): 1)  Crestor 40 Mg Tabs (Rosuvastatin Calcium) .Marland Kitchen.. 1 By Mouth Once Daily 2)  Lorazepam 0.5 Mg Tabs (Lorazepam) .... Take 1 Tablet By Mouth Twice A Day As Needed For Stress 3)  Lopressor 50 Mg  Tabs (Metoprolol Tartrate) .... 1/2 By Mouth Two Times A Day 4)  Ambien 10 Mg  Tabs (Zolpidem Tartrate) .Marland Kitchen.. 1 By Mouth At Bedtime 5)  Amlodipine Besylate 5 Mg  Tabs (Amlodipine Besylate) .... Once Daily 6)  Ketoconazole 2 %  Crea (Ketoconazole) .... Apply Three Times A Day As Needed 7)  Drisdol 60454 Unit Caps (Ergocalciferol) .Marland Kitchen.. 1 By Mouth Q Week or As Directed 8)  Prilosec 20 Mg Cpdr (Omeprazole) .Marland Kitchen.. 1 By Mouth Two Times A Day  or As Directed 9)  Cymbalta 60 Mg Cpep (Duloxetine Hcl) .Marland Kitchen..  1 By Mouth Once Daily or As Directed 10)  Tramadol Hcl 50 Mg Tabs (Tramadol Hcl) .Marland Kitchen.. 1 By Mouth Three Times A Day As Needed Pain.  Allergies (verified): 1)  ! * Meloxicam 2)  Sulfamethoxazole (Sulfamethoxazole)  Past History:  Past medical, surgical, family and social histories (including risk factors) reviewed, and no changes noted (except as noted below).  Past Medical History: Reviewed history from 08/14/2009 and no changes required. 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 osteopenia.   Consults Dr. Antoine Poche Dr. Danielle Dess Dr. Kellie Simmering Dr. Jarold Motto  Past Surgical History: Reviewed history from 05/02/2008 and no changes required. Cholecystectomy Total knee replacement right benign cyst  2001 "bladder tumor"  2001 head CT neg  08/03 R shoulder tendon tear  08/07 surgery for slipped disk  Past History:  Care Management: Gastroenterology: Dr. Jarold Motto Orthopedics: [facility]: [facility] Urology: 2010  [facility] 3 23 - 3 27  2011acute renal failure  Nephrology consult Dr Darrick Penna.  Family History: Reviewed history from 07/25/2008 and no changes required. Family History High cholesterol Family History Lung cancer father died 67 tobacco REnal cancer son Family History Hypertension bro and moms side Alzheiners mom died from this  DM pgm      No FH of Colon Cancer: Family History of Stomach Cancer:paternal grandfather sister has osteoporis.    Social History: Reviewed history from 08/14/2009 and no changes required. 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         Bp was up this am in 140-150 range  then took b blocker .    Physical Exam  General:  Well-developed,well-nourished,in no acute distress; alert,appropriate and cooperative throughout examination Chest Wall:  no deformities, no tenderness, and no mass.   Breasts:  breast pendulous  left    without dimpling or rash   9 oclok  a b b sized 2-3 mm/   firm mobile  niodule with lumpy breast around .   cant feel when supine; only with  upright position no dimpling or nipple discharge.  Lungs:  normal respiratory effort and no intercostal retractions.   Heart:  normal rate and regular rhythm.   Pulses:  nl cap  refill  Skin:  turgor normal, color normal, no ecchymoses, and no petechiae.   Axillary Nodes:  No palpable lymphadenopathy   Impression & Recommendations:  Problem # 1:  LUMP OR MASS IN BREAST (ICD-611.72) small  but firm  certainly could have been there before  and easier to feel since losing weight.    Also has lumpy breast on the left.  Orders: Radiology Referral (Radiology)  Problem # 2:  HYPERTENSION (ICD-401.9) good today    up at home         will recheck at follow up  Her updated medication list for this problem includes:    Lopressor 50 Mg Tabs (Metoprolol tartrate) .Marland Kitchen... 1/2 by mouth two times a day    Amlodipine Besylate 5 Mg Tabs (Amlodipine besylate) ..... Once daily  Complete Medication List: 1)  Crestor 40 Mg Tabs (Rosuvastatin calcium) .Marland Kitchen.. 1 by mouth once daily 2)  Lorazepam 0.5 Mg Tabs (Lorazepam) .... Take 1 tablet by mouth twice a day as needed for stress 3)  Lopressor 50 Mg Tabs (Metoprolol tartrate) .... 1/2 by mouth two times a day 4)  Ambien 10 Mg Tabs (Zolpidem tartrate) .Marland Kitchen.. 1 by mouth at bedtime 5)  Amlodipine Besylate 5 Mg Tabs (Amlodipine besylate)  .... Once daily 6)  Ketoconazole 2 % Crea (Ketoconazole) .... Apply three times a day as needed 7)  Drisdol 16109 Unit Caps (Ergocalciferol) .Marland Kitchen.. 1 by mouth q week or as directed 8)  Prilosec 20 Mg Cpdr (Omeprazole) .Marland Kitchen.. 1 by mouth two times a day  or as directed 9)  Cymbalta 60 Mg Cpep (Duloxetine hcl) .Marland Kitchen.. 1 by mouth once daily or as directed 10)  Tramadol Hcl 50 Mg Tabs (Tramadol hcl) .Marland Kitchen.. 1 by mouth three times a day as needed pain.  Patient Instructions: 1)  will schedule  diagnostic    mammogram   and ? Ultrasound   via [facility].  2)  bring you Blood pressure machine  in to your next visit.

## 2010-05-22 NOTE — Assessment & Plan Note (Signed)
Summary: 1 month rov.njr   Vital Signs:  Patient profile:   70 year old female Menstrual status:  postmenopausal Weight:      154 pounds O2 Sat:      96 % Temp:     98.3 degrees F Pulse rate:   73 / minute BP sitting:   140 / 80  (left arm)  Vitals Entered By: Pura Spice, RN (December 13, 2009 12:56 PM) CC: 4 wk follw up  hx kidney failure    History of Present Illness: [name] [name] comes in today  for follow up  of anumber of issues   BP:    115 at home .     no passing out. no HA  taking meds without problem otherwise . MOOD:  better than before cymbalta  some good days  PAIN: oa and fm some better  asks if can go up on dosing.   GI   good  on omeprezole       but Get  milk   reflux    ocass    Shingles in past   has ?   gets ocass itching  at same place.      Preventive Screening-Counseling & Management  Alcohol-Tobacco     Alcohol drinks/day: 0     Smoking Status: never  Caffeine-Diet-Exercise     Caffeine use/day: 1     Does Patient Exercise: yes     Type of exercise: walk  Allergies: 1)  ! * Meloxicam 2)  Sulfamethoxazole (Sulfamethoxazole)  Past History:  Past medical, surgical, family and social histories (including risk factors) reviewed, and no changes noted (except as noted below).  Past Medical History: Reviewed history from 11/10/2009 and no changes required. 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 osteopenia.   Consults Dr. Antoine Poche Dr. Danielle Dess Dr. Kellie Simmering Dr. Jarold Motto  Past Surgical History: Reviewed history from 05/02/2008 and no changes  required. Cholecystectomy Total knee replacement right benign cyst  2001 "bladder tumor"  2001 head CT neg  08/03 R shoulder tendon tear  08/07 surgery for slipped disk  Family History: Reviewed history from 07/25/2008 and no changes required. Family History High cholesterol Family History Lung cancer father died 41 tobacco REnal cancer son Family History Hypertension bro and moms side Alzheiners mom died from this  DM pgm      No FH of Colon Cancer: Family History of Stomach Cancer:paternal grandfather sister has osteoporis.    Social History: Reviewed history from 08/14/2009 and no changes required. 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    Physical Exam  General:  alert, well-developed, well-nourished, and well-hydrated.   Head:  normocephalic and atraumatic.   Neck:  No deformities, masses, or tenderness noted. Lungs:  Normal respiratory effort, chest expands symmetrically. Lungs are clear to auscultation, no crackles or wheezes. Heart:  Normal rate and regular rhythm. S1 and S2 normal without gallop, murmur, click, rub or other extra sounds. Msk:  oa changes no acute changes  Pulses:  pulses intact without delay   Extremities:  no clubbing cyanosis or edema  Skin:  turgor normal, color normal, no ecchymoses, and no petechiae.   Cervical Nodes:  No lymphadenopathy noted Psych:  Oriented X3, normally interactive, good eye contact, and not depressed appearing.  not anxious  nl speech and cognition   Impression & Recommendations:  Problem # 1:  HYPERTENSION (ICD-401.9) creatinine stable  Her  updated medication list for this problem includes:    Lopressor 50 Mg Tabs (Metoprolol tartrate) .Marland Kitchen... 1/2 by mouth two times a day    Amlodipine Besylate 5 Mg Tabs (Amlodipine besylate) ..... Once daily  BP today: 140/80 Prior BP: 100/60 (11/10/2009)  Prior 10 Yr Risk Heart Disease: 4 % (10/10/2009)  Labs Reviewed: K+: 5.0 (11/10/2009) Creat: : 0.7 (11/10/2009)   Chol: 152 (11/10/2009)   HDL: 75.80 (11/10/2009)   LDL: 63 (11/10/2009)   TG: 66.0 (11/10/2009)  Problem # 2:  OSTEOARTHRITIS (ICD-715.90) rarely takes med   cymbalta helps  avoiding nsaid cause of hx of renal failure. Her updated medication list for this problem includes:    Tramadol Hcl 50 Mg Tabs (Tramadol hcl) .Marland Kitchen... 1 by mouth three times a day as needed pain.  Problem # 3:  FIBROMYALGIA (ICD-729.1) Assessment: Improved disc increasing cymbalta   not taking ultram Her updated medication list for this problem includes:    Tramadol Hcl 50 Mg Tabs (Tramadol hcl) .Marland Kitchen... 1 by mouth three times a day as needed pain.  Problem # 4:  ANEMIA (ICD-285.9) Assessment: Improved  Hgb: 13.3 (11/10/2009)   Hct: 38.8 (11/10/2009)   Platelets: 163.0 (11/10/2009) RBC: 4.38 (11/10/2009)   RDW: 14.4 (11/10/2009)   WBC: 4.9 (11/10/2009) MCV: 88.5 (11/10/2009)   MCHC: 34.3 (11/10/2009) Ferritin: 114.7 (09/26/2008) Iron: 66 (08/09/2009)   % Sat: 26.6 (08/09/2009) B12: 295 (08/09/2009)   Folate: > 20.0 ng/mL (05/05/2008)   TSH: 1.83 (11/09/2008)  Problem # 5:  GRIEF REACTION (ICD-309.0) Assessment: Improved  Problem # 6:  GERD (ICD-530.81) Assessment: Improved disc lifestyle and rx   follow  Her updated medication list for this problem includes:    Prilosec 20 Mg Cpdr (Omeprazole) .Marland Kitchen... 1 by mouth two times a day  or as directed  Complete Medication List: 1)  Crestor 40 Mg Tabs (Rosuvastatin calcium) .Marland Kitchen.. 1 by mouth once daily 2)  Lorazepam 0.5 Mg Tabs (Lorazepam) .... Take 1 tablet by mouth twice a day as needed for stress 3)   Lopressor 50 Mg Tabs (Metoprolol tartrate) .... 1/2 by mouth two times a day 4)  Ambien 10 Mg Tabs (Zolpidem tartrate) .Marland Kitchen.. 1 by mouth at bedtime 5)  Amlodipine Besylate 5 Mg Tabs (Amlodipine besylate) .... Once daily 6)  Ketoconazole 2 % Crea (Ketoconazole) .... Apply three times a day as needed 7)  Drisdol 16109 Unit Caps (Ergocalciferol) .Marland Kitchen.. 1 by mouth q week or as directed 8)  Prilosec 20 Mg Cpdr (Omeprazole) .Marland Kitchen.. 1 by mouth two times a day  or as directed 9)  Cymbalta 60 Mg Cpep (Duloxetine hcl) .... 2  by mouth once daily or as directed 10)  Tramadol Hcl 50 Mg Tabs (Tramadol hcl) .Marland Kitchen.. 1 by mouth three times a day as needed pain.  Patient Instructions: 1)  ok  to try  120 mg  of cymbalta  for the fibro and pain.  2)  Continue monitoring your blood pressure . 3)  ok to get a shingles shot   . 4)  flu shot when convenient.    5)  return office visit in 2 months or as needed. samples given cymbalta.

## 2010-05-22 NOTE — Progress Notes (Signed)
Summary: patient sick with fever/chills  Phone Note Call from Patient Call back at Home Phone [phone]   Caller: Patient Reason for Call: Acute Illness Summary of Call: Patient sick x1 week with chills and fever.  Patient wants a call - she is wondering if it is a virus. Initial call taken by: Everrett Coombe,  July 12, 2009 11:42 AM  Follow-up for Phone Call        Spoke to pt and she started having chills on 3/17. Pt had a procedure on her veins done on 3/17. Pt told them about this when she had the procedure done. Pt has a decreased appitite. Pt hasn't check her temp but has had sweats.  Pt to come in at 2:30pm. Follow-up by: Romualdo Bolk, CMA (AAMA),  July 12, 2009 11:54 AM

## 2010-05-22 NOTE — Assessment & Plan Note (Signed)
Summary: 3 wk rov/njr   Vital Signs:  Patient profile:   70 year old female Menstrual status:  postmenopausal Height:      62 inches Weight:      146.8 pounds Temp:     98.2 degrees F oral Pulse rate:   89 / minute BP sitting:   140 / 70  (right arm)  Serial Vital Signs/Assessments:  Time      Position  BP       Pulse  Resp  Temp     By                     142/72                         Madelin Headings MD  Comments: peronsal cuff By: Madelin Headings MD   CC: ROV HTN, Hypertension Management   History of Present Illness: Mario Tufte comesin for ,fu of munber of issues   BP : seems to be controlled  high nl at times  into 140s some NO signs . Mood: Ready to try cymbalta .      mood and Fibromyalgia. Anemia: no bleeding    Hypertension History:      She complains of headache and visual symptoms, but denies chest pain, palpitations, dyspnea with exertion, syncope, and side effects from treatment.        Positive major cardiovascular risk factors include female age 3 years old or older, hyperlipidemia, and hypertension.  Negative major cardiovascular risk factors include non-tobacco-user status.    Preventive Screening-Counseling & Management  Alcohol-Tobacco     Alcohol drinks/day: 0     Smoking Status: never  Caffeine-Diet-Exercise     Caffeine use/day: 1     Does Patient Exercise: yes     Type of exercise: walk  Current Medications (verified): 1)  Crestor 40 Mg Tabs (Rosuvastatin Calcium) .Marland Kitchen.. 1 By Mouth Once Daily 2)  Lorazepam 0.5 Mg Tabs (Lorazepam) .... Take 1 Tablet By Mouth Twice A Day As Needed For Stress 3)  Lopressor 50 Mg  Tabs (Metoprolol Tartrate) .... 1/2 By Mouth Two Times A Day 4)  Ambien 10 Mg  Tabs (Zolpidem Tartrate) .Marland Kitchen.. 1 By Mouth At Bedtime 5)  Amlodipine Besylate 5 Mg  Tabs (Amlodipine Besylate) .... Once Daily 6)  Ketoconazole 2 %  Crea (Ketoconazole) .... Apply Three Times A Day As Needed 7)  Drisdol 16109 Unit Caps (Ergocalciferol) .Marland Kitchen.. 1  By Mouth Q Week or As Directed 8)  Prilosec 20 Mg Cpdr (Omeprazole) .Marland Kitchen.. 1 By Mouth Two Times A Day  or As Directed 9)  Cymbalta 60 Mg Cpep (Duloxetine Hcl) .Marland Kitchen.. 1 By Mouth Once Daily or As Directed 10)  Tramadol Hcl 50 Mg Tabs (Tramadol Hcl) .Marland Kitchen.. 1 By Mouth Three Times A Day As Needed Pain.  Allergies (verified): 1)  ! * Meloxicam 2)  Sulfamethoxazole (Sulfamethoxazole)  Past History:  Past medical, surgical, family and social histories (including risk factors) reviewed, and no changes noted (except as noted below).  Past Medical History: Reviewed history from 08/14/2009 and no changes required. 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 osteopenia.   Consults Dr. Antoine Poche Dr. Danielle Dess Dr. Kellie Simmering Dr. Jarold Motto  Past Surgical History: Reviewed history from 05/02/2008 and no changes required. Cholecystectomy Total knee replacement right benign cyst  2001 "bladder tumor"  2001 head CT neg  08/03 R shoulder tendon tear  08/07 surgery for slipped disk  Family History: Reviewed history from 07/25/2008 and no changes required. Family History High cholesterol Family History Lung cancer father died 30 tobacco REnal cancer son Family History Hypertension bro and moms side Alzheiners mom died from this  DM pgm      No FH of Colon Cancer: Family History of Stomach Cancer:paternal grandfather sister has osteoporis.    Social History: Reviewed history from 08/14/2009 and no changes required. 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   Neck:  No deformities, masses, or tenderness noted. Lungs:  normal respiratory effort and no intercostal retractions.   Heart:  normal rate and regular rhythm.   Msk:  oa changes  Pulses:  nl cap  refill  Extremities:  trace left pedal edema and trace right pedal edema.   Neurologic:  non focal  Skin:  turgor normal, color normal, no ecchymoses, and no petechiae.   Cervical Nodes:  No lymphadenopathy noted Psych:  Oriented X3, normally interactive, good eye contact, not depressed appearing, and slightly anxious.     Impression & Recommendations:  Problem # 1:  HYPERTENSION (ICD-401.9) slightly increased  Her updated medication list for this problem includes:    Lopressor 50 Mg Tabs (Metoprolol tartrate) .Marland Kitchen... 1/2 by mouth two times a day    Amlodipine Besylate 5 Mg Tabs (Amlodipine besylate) ..... Once daily  BP today: 140/70 Prior BP: 110/60 (08/28/2009)  10 Yr Risk Heart Disease: 9 % Prior 10 Yr Risk Heart Disease: 4 % (08/14/2009)  Labs Reviewed: K+: 3.7 (08/09/2009) Creat: : 1.1 (08/09/2009)   Chol: 113 (07/15/2008)   HDL: 54.20 (07/15/2008)   LDL: 48 (07/15/2008)   TG: 53.0 (07/15/2008)  Problem # 2:  FIBROMYALGIA (ICD-729.1)  Her updated medication list for this problem includes:    Tramadol Hcl 50 Mg Tabs (Tramadol hcl) .Marland Kitchen... 1 by mouth three times a day as needed pain.  Problem # 3:  ANEMIA (ICD-285.9) Assessment: Improved  Orders: Hgb (85018) Fingerstick (66440)  Hgb: 12.1 (09/05/2009)   Hct: 32.0 (08/09/2009)   Platelets: 251.0 (08/09/2009) RBC: 3.66 (08/09/2009)   RDW: 17.3 (08/09/2009)   WBC: 4.3 (08/09/2009) MCV: 87.5 (08/09/2009)   MCHC: 33.3 (08/09/2009) Ferritin: 114.7 (09/26/2008) Iron: 66 (08/09/2009)   % Sat: 26.6 (08/09/2009) B12: 295 (08/09/2009)   Folate: > 20.0 ng/mL (05/05/2008)    TSH: 1.83 (11/09/2008)  Problem # 4:  renal insuff  stable   Problem # 5:  VITAMIN D DEFICIENCY (ICD-268.9) Assessment: Comment Only  Problem # 6:  OSTEOARTHRITIS (ICD-715.90) avoiding NSAID  hopefully cymbalata will help this pain. Her updated medication list for this problem includes:    Tramadol Hcl 50 Mg Tabs (Tramadol hcl) .Marland Kitchen... 1 by mouth three times a day as needed pain.  Complete Medication List: 1)  Crestor 40 Mg Tabs (Rosuvastatin calcium) .Marland Kitchen.. 1 by mouth once daily 2)  Lorazepam 0.5 Mg Tabs (Lorazepam) .... Take  1 tablet by mouth twice a day as needed for stress 3)  Lopressor 50 Mg Tabs (Metoprolol tartrate) .... 1/2 by mouth two times a day 4)  Ambien 10 Mg Tabs (Zolpidem tartrate) .Marland Kitchen.. 1 by mouth at bedtime 5)  Amlodipine Besylate 5 Mg Tabs (Amlodipine besylate) .... Once daily 6)  Ketoconazole 2 % Crea (Ketoconazole) .... Apply three times a day as needed 7)  Drisdol 32440 Unit Caps (Ergocalciferol) .Marland Kitchen.. 1 by mouth q week or as directed 8)  Prilosec 20 Mg Cpdr (Omeprazole) .Marland Kitchen.. 1 by mouth two times a day  or as directed 9)  Cymbalta 60 Mg Cpep (Duloxetine hcl) .Marland Kitchen.. 1 by mouth once daily or as directed 10)  Tramadol Hcl 50 Mg Tabs (Tramadol hcl) .Marland Kitchen.. 1 by mouth three times a day as needed pain.  Hypertension Assessment/Plan:      The patient's hypertensive risk group is category B: At least one risk factor (excluding diabetes) with no target organ damage.  Her calculated 10 year risk of coronary heart disease is 9 %.  Today's blood pressure is 140/70.  Her blood pressure goal is < 140/90.   Patient Instructions: 1)  Continue to monitor your BP readings.   If BP readings are consistently above 140/90 call and we will adjust medication. 2)  Ok to start the cymbalta.. 3)  30 mg per day.  after a week and then   60 mg per day.  4)  Your Hg is  normal today!  continue multivitamin with iron.  5)  Please schedule a follow-up appointment in 1 month.  Prescriptions: DRISDOL  50000 UNIT CAPS (ERGOCALCIFEROL) 1 by mouth q week or as directed  #12 x 1   Entered and Authorized by:   Madelin Headings MD   Signed by:   Madelin Headings MD on 09/05/2009   Method used:   Electronically to        [facility] [phone]* (retail)       [address]       [place], [place]  [address]       Ph: [phone] or [phone]       Fax: [phone]   RxID:   [id number]   Laboratory Results   CBC   HGB:  12.1 g/dL   (Normal Range: 32.2-02.5 in Males, 12.0-15.0 in Females) Comments: Rita Ohara  Sep 05, 2009 11:56 AM

## 2010-05-22 NOTE — Medication Information (Signed)
Summary: Cymbalta Approved  Cymbalta Approved   Imported By: Maryln Gottron 02/23/2010 11:23:56  _____________________________________________________________________  External Attachment:    Type:   Image     Comment:   External Document

## 2010-05-22 NOTE — Progress Notes (Signed)
Summary: refill  Phone Note From Pharmacy   [id number] Reason for Call: Needs renewal Details for Reason: crestor Initial call taken by: Romualdo Bolk, CMA (AAMA),  March 19, 2010 1:38 PM    Prescriptions: CRESTOR 40 MG TABS (ROSUVASTATIN CALCIUM) 1 by mouth once daily  #30 x 1   Entered by:   Romualdo Bolk, CMA (AAMA)   Authorized by:   Madelin Headings MD   Signed by:   Romualdo Bolk, CMA (AAMA) on 03/19/2010   Method used:   Electronically to        [facility] [phone]* (retail)       [address]       [place], [place]  [address]       Ph: [phone] or [phone]       Fax: [phone]   RxID:   [id number]

## 2010-05-22 NOTE — Letter (Signed)
Summary: [facility]  [facility]   Imported By: Maryln Gottron 02/20/2010 10:34:33  _____________________________________________________________________  External Attachment:    Type:   Image     Comment:   External Document

## 2010-05-22 NOTE — Progress Notes (Signed)
Summary: [facility] 08/23/2009  Phone Note Call from Patient   Caller: Patient Call For: Madelin Headings MD Summary of Call: Pt feels a mass in left breast 3 months, and wants a diagnostic mammogram.  Will Dr[facility] Fabian Sharp [facility]? [phone] Initial call taken by: Lynann Beaver CMA,  Aug 22, 2009 2:33 PM  Follow-up for Phone Call        Needs office visit   to examine this place   before referral can be done  [facility] CMA  Aug 23, 2009 8:21 AM\par  Follow-up by: Madelin Headings MD,  Aug 22, 2009 5:17 PM  Additional Follow-up for Phone Call Additional follow up Details #1::        Spoke to pt and appt scheduled. Additional Follow-up by: Lynann Beaver CMA,  Aug 25, 2009 8:15 AM

## 2010-05-22 NOTE — Progress Notes (Signed)
Summary: refill on crestor  Phone Note From Pharmacy   [id number] Reason for Call: Needs renewal Details for Reason: Crestor 40mg  Initial call taken by: Romualdo Bolk, CMA Duncan Dull),  December 04, 2009 3:52 PM  Follow-up for Phone Call        Rx sent to pharmacy Follow-up by: Romualdo Bolk, CMA Duncan Dull),  December 04, 2009 3:53 PM    Prescriptions: CRESTOR 40 MG TABS (ROSUVASTATIN CALCIUM) 1 by mouth once daily  #30 x 3   Entered by:   Romualdo Bolk, CMA (AAMA)   Authorized by:   Madelin Headings MD   Signed by:   Romualdo Bolk, CMA (AAMA) on 12/04/2009   Method used:   [facility]        [facility] [phone]* (retail)       [address]       [place], [place]  [address]       Ph: [phone] or [phone]       Fax: [phone]   RxID:   [id number]

## 2010-05-22 NOTE — Assessment & Plan Note (Signed)
Summary: chest congestion/head congestion/sorethroat and mouth/per sha...   Vital Signs:  Patient profile:   70 year old female Menstrual status:  postmenopausal Height:      62 inches Weight:      151 pounds BMI:     27.72 Temp:     97.8 degrees F oral Pulse rate:   72 / minute BP sitting:   120 / 80  (left arm) Cuff size:   regular  Vitals Entered By: Romualdo Bolk, CMA (AAMA) (April 25, 2009 9:17 AM) CC: Started with a coughing, sore throat that started one week ago and now has head and chest congestion. No SOB   History of Present Illness: [name] [name] comesin today for    SDA   Onset of st and nasal congestinon and then cough about a week ago and now concern about green nasal dicharge.   Face pressure .  Concerned about  bacterial infection with hx of same.   some wheezy feeling in  chest.   Update since last visit  has been seen in ed for  detaching retina right VV :   treatment  working on left thigh   and now  pain  is much better  ST :  conern about chronicity even there when  when not sick . No tru dyphagia but worred about ongoing symptom   Preventive Screening-Counseling & Management  Alcohol-Tobacco     Alcohol drinks/day: 0     Smoking Status: never  Caffeine-Diet-Exercise     Caffeine use/day: 1     Does Patient Exercise: yes     Type of exercise: walk  Current Medications (verified): 1)  Crestor 40 Mg Tabs (Rosuvastatin Calcium) .Marland Kitchen.. 1 By Mouth Once Daily 2)  Lorazepam 0.5 Mg Tabs (Lorazepam) .... Take 1 Tablet By Mouth Twice A Day As Needed For Stress 3)  Meloxicam 15 Mg Tabs (Meloxicam) .Marland Kitchen.. 1 By Mouth Once Daily 4)  Lopressor 50 Mg  Tabs (Metoprolol Tartrate) .Marland Kitchen.. 1 By Mouth Two Times A Day 5)  Ambien 10 Mg  Tabs (Zolpidem Tartrate) .Marland Kitchen.. 1 By Mouth At Bedtime 6)  Amlodipine Besylate 5 Mg  Tabs (Amlodipine Besylate) .... Once Daily 7)  Ketoconazole 2 %  Crea (Ketoconazole) .... Apply Three Times A Day As Needed 8)  Drisdol 98119 Unit Caps  (Ergocalciferol) .Marland Kitchen.. 1 By Mouth Q Week or As Directed 9)  Prilosec 20 Mg Cpdr (Omeprazole) .Marland Kitchen.. 1 By Mouth Two Times A Day  or As Directed 10)  Cymbalta 60 Mg Cpep (Duloxetine Hcl) .Marland Kitchen.. 1 By Mouth Once Daily or As Directed 11)  Hydrocodone-Homatropine 5-1.5 Mg/11ml Syrp (Hydrocodone-Homatropine) .Marland Kitchen.. 1-2 Tsp By Mouth Q 4-6 Hours As Needed Coudh  Allergies (verified): 1)  Sulfamethoxazole (Sulfamethoxazole)  Past History:  Past medical, surgical, family and social histories (including risk factors) reviewed, and no changes noted (except as noted below).  Past Medical History: Reviewed history from 01/04/2009 and no changes required. 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 osteopenia.   Consults Dr. Antoine Poche Dr. Danielle Dess Dr. Kellie Simmering Dr. Jarold Motto  Past Surgical History: Reviewed history from 05/02/2008 and no changes required. Cholecystectomy Total  knee replacement right benign cyst  2001 "bladder tumor"  2001 head CT neg  08/03 R shoulder tendon tear  08/07 surgery for slipped disk  Family History: Reviewed history from 07/25/2008 and no changes required. Family History High cholesterol Family History Lung cancer father died 22 tobacco REnal cancer son Family History Hypertension bro and moms side Alzheiners mom died from this  DM pgm      No FH of Colon Cancer: Family History of Stomach Cancer:paternal grandfather sister has osteoporis.    Social History: Reviewed history from 05/05/2008 and no changes required. 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    Physical Exam  General:  alert, well-developed, and well-hydrated.   very hoarse and coughin g anc=d congestioedd Head:  normocephalic and atraumatic.   Eyes:  vision grossly intact, pupils equal, and pupils round.   Ears:  R ear normal and L ear normal.   Nose:  no external deformity and no external erythema.   Mouth:  mild erythema with cobblestoning  Neck:  No deformities, masses, or tenderness noted. Lungs:  Normal respiratory effort, chest expands symmetrically. Lungs are clear to auscultation, no crackles or wheezes.no dullness.   Heart:  Normal rate and regular rhythm. S1 and S2 normal without gallop, murmur, click, rub or other extra sounds.no lifts.   Pulses:  nl cap refill  Neurologic:  alert & oriented X3, strength normal in all extremities, and gait normal.   Skin:  turgor normal, color normal, no ecchymoses, and no petechiae.   Cervical Nodes:  No lymphadenopathy noted Psych:  Oriented X3, not anxious appearing, and not depressed appearing.     Impression & Recommendations:  Problem # 1:  SINUSITIS, ACUTE (ICD-461.9) poss viral but  can add antibiotic if persistent and progressive  and alarm findings  The following medications were removed from the medication list:    Zithromax Z-pak 250 Mg Tabs (Azithromycin) .Marland Kitchen... Take as directed Her updated medication list for this problem includes:    Hydrocodone-homatropine 5-1.5 Mg/29ml Syrp (Hydrocodone-homatropine) .Marland Kitchen... 1-2 tsp by mouth q 4-6 hours as needed coudh    Amoxicillin 875 Mg Tabs (Amoxicillin) .Marland Kitchen... 1 by mouth three times a day    Benzonatate 100 Mg Caps (Benzonatate) .Marland Kitchen... 1 by mouth three times a day as needed cough  as needed  Problem # 2:  ACUTE BRONCHITIS (ICD-466.0)  see above    The following medications were removed from the medication list:    Zithromax Z-pak 250 Mg Tabs (Azithromycin) .Marland Kitchen... Take as directed Her updated medication list for this problem includes:    Hydrocodone-homatropine 5-1.5 Mg/40ml Syrp (Hydrocodone-homatropine) .Marland Kitchen... 1-2 tsp by mouth q 4-6 hours as needed coudh    Amoxicillin 875 Mg Tabs (Amoxicillin) .Marland Kitchen... 1 by mouth three times a day    Benzonatate 100 Mg Caps (Benzonatate) .Marland Kitchen... 1 by mouth three times a day as needed cough  as needed  Orders: Prescription Created Electronically [phone])  Problem # 3:  SORE THROAT (ICD-462) ? if could be reflux related  see ent when better about chroninc st  and copy to [place] .  The following medications were removed from the medication list:    Zithromax Z-pak 250 Mg  Tabs (Azithromycin) .Marland Kitchen... Take as directed Her updated medication list for this problem includes:    Meloxicam 15 Mg Tabs (Meloxicam) .Marland Kitchen... 1 by mouth once daily    Amoxicillin 875 Mg Tabs (Amoxicillin) .Marland Kitchen... 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   Problem # 6:  VARICOSE VEINS, LOWER EXTREMITIES (ICD-454.9) Assessment: Improved under rx   Complete Medication List: 1)  Crestor 40 Mg Tabs (Rosuvastatin calcium) .Marland Kitchen.. 1 by mouth once daily 2)  Lorazepam 0.5 Mg Tabs (Lorazepam) .... Take 1 tablet by mouth twice a day as needed for stress 3)  Meloxicam 15 Mg Tabs (Meloxicam) .Marland Kitchen.. 1 by mouth once daily 4)  Lopressor 50 Mg Tabs (Metoprolol tartrate) .Marland Kitchen.. 1 by mouth two times a day 5)  Ambien 10 Mg Tabs (Zolpidem tartrate) .Marland Kitchen.. 1 by mouth at bedtime 6)  Amlodipine Besylate 5 Mg Tabs (Amlodipine besylate) .... Once daily 7)  Ketoconazole 2 % Crea (Ketoconazole) .... Apply three times a day as needed 8)  Drisdol 16109 Unit Caps (Ergocalciferol) .Marland Kitchen.. 1 by mouth q week or as directed 9)  Prilosec 20 Mg Cpdr  (Omeprazole) .Marland Kitchen.. 1 by mouth two times a day  or as directed 10)  Cymbalta 60 Mg Cpep (Duloxetine hcl) .Marland Kitchen.. 1 by mouth once daily or as directed 11)  Hydrocodone-homatropine 5-1.5 Mg/43ml Syrp (Hydrocodone-homatropine) .Marland Kitchen.. 1-2 tsp by mouth q 4-6 hours as needed coudh 12)  Amoxicillin 875 Mg Tabs (Amoxicillin) .Marland Kitchen.. 1 by mouth three times a day 13)  Benzonatate 100 Mg Caps (Benzonatate) .Marland Kitchen.. 1 by mouth three times a day as needed cough  as needed  Patient Instructions: 1)  Acute sinusitis symptoms for less than 10 days are not helped by antibiotics. Use warm moist compresses, and over the counter decongestants( only as directed). Call if no improvement in 5-7 days, sooner if increasing pain, fever, or new symptoms.  2)  Acute Bronchitis symptoms for less then 10 days are not  helped by antibiotics. Take over the counter cough medications. Call if no improvement in 5-7 days, sooner if increasing cough, fever, or new symptoms ( shortness of breath, chest pain) .  3)  can add antibiotic if not beginning to improve in the next 2-3 days or  is worse.  Prescriptions: BENZONATATE 100 MG CAPS (BENZONATATE) 1 by mouth three times a day as needed cough  as needed  #40 x 0   Entered and Authorized by:   Madelin Headings MD   Signed by:   Madelin Headings MD on 04/25/2009   Method used:   [facility]        [facility] [phone][facility] (retail)       [address]       [place], [place]  [address]       Ph: [phone] or [phone]       Fax: [phone]   RxID:   [id number] AMOXICILLIN 875 MG TABS (AMOXICILLIN) 1 by mouth three times a day  #30 x 0   Entered and Authorized by:   Madelin Headings MD   Signed by:   Madelin Headings MD on 04/25/2009   Method used:   Print then Give to Patient   RxID:   [id number]

## 2010-05-22 NOTE — Assessment & Plan Note (Signed)
Summary: 2 month fup//ccm   Vital Signs:  Patient profile:   70 year old female Menstrual status:  postmenopausal Height:      62 inches Weight:      163.5 pounds BMI:     30.01 Pulse rate:   78 / minute BP sitting:   120 / 80  (left arm) Cuff size:   regular  Vitals Entered By: Romualdo Bolk, CMA (AAMA) (February 08, 2010 9:23 AM) CC: follow-up visit, Hypertension Management   History of Present Illness: [name] [name]  comes in today  for above  follow up .    of   multiple medical problems . since her last visit she's done pretty well however she has had some weight gain.   she states she is eating a lot of bagels which she enjoys. No increased abdominal pain and nausea vomiting..   higher dose cymbalta   has helped the mood and pain.      but has gained weight   with this eating more. she can tell her pain persists but is better than it was. BP : has been doing ok  130 / readings. No chest pain shortness of breath increasing edema. She does have some upper back pain and soreness. No parent side effects of the medicine of significance otherwise. She still has some problems with recurrent sore throats that she describes as someone this with swallowing  . Has seen Dr. Cloria Spring  in the remote past.  Hypertension History:      She complains of peripheral edema, but denies headache, chest pain, palpitations, dyspnea with exertion, orthopnea, PND, visual symptoms, neurologic problems, syncope, and side effects from treatment.  She notes no problems with any antihypertensive medication side effects.  Left ankle swelling  chronic .        Positive major cardiovascular risk factors include female age 25 years old or older, hyperlipidemia, and hypertension.  Negative major cardiovascular risk factors include non-tobacco-user status.     Preventive Screening-Counseling & Management  Alcohol-Tobacco     Alcohol drinks/day: 0     Smoking Status: never  Caffeine-Diet-Exercise     Caffeine  use/day: 1     Does Patient Exercise: yes     Type of exercise: walk  Current Medications (verified): 1)  Crestor 40 Mg Tabs (Rosuvastatin Calcium) .Marland Kitchen.. 1 By Mouth Once Daily 2)  Lorazepam 0.5 Mg Tabs (Lorazepam) .... Take 1 Tablet By Mouth Twice A Day As Needed For Stress 3)  Lopressor 50 Mg  Tabs (Metoprolol Tartrate) .... 1/2 By Mouth Two Times A Day 4)  Ambien 10 Mg  Tabs (Zolpidem Tartrate) .Marland Kitchen.. 1 By Mouth At Bedtime 5)  Amlodipine Besylate 5 Mg  Tabs (Amlodipine Besylate) .... Once Daily 6)  Ketoconazole 2 %  Crea (Ketoconazole) .... Apply Three Times A Day As Needed 7)  Drisdol 16109 Unit Caps (Ergocalciferol) .Marland Kitchen.. 1 By Mouth Q 2  Weeks or As Directed 8)  Prilosec 20 Mg Cpdr (Omeprazole) .Marland Kitchen.. 1 By Mouth Two Times A Day  or As Directed 9)  Cymbalta 60 Mg Cpep (Duloxetine Hcl) .... 2  By Mouth Once Daily or As Directed 10)  Tramadol Hcl 50 Mg Tabs (Tramadol Hcl) .Marland Kitchen.. 1 By Mouth Three Times A Day As Needed Pain.  Allergies (verified): 1)  ! * Meloxicam 2)  Sulfamethoxazole (Sulfamethoxazole)  Past History:  Past medical, surgical, family and social histories (including risk factors) reviewed, and no changes noted (except as noted below).  Past Medical  History: Reviewed history from 11/10/2009 and no changes required. 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 osteopenia.   Consults Dr. Antoine Poche Dr. Danielle Dess Dr. Kellie Simmering Dr. Jarold Motto  Past Surgical History: Reviewed history from 05/02/2008 and no changes required. Cholecystectomy Total knee replacement right benign cyst  2001 "bladder tumor"   2001 head CT neg  08/03 R shoulder tendon tear  08/07 surgery for slipped disk  Past History:  Care Management: Gastroenterology: Dr. Jarold Motto Orthopedics: [facility]: [facility] Urology: 2010  Hosp 3 23 - 3 27  2011acute renal failure  Nephrology consult Dr Darrick Penna.  Family History: Reviewed history from 07/25/2008 and no changes required. Family History High cholesterol Family History Lung cancer father died 77 tobacco REnal cancer son Family History Hypertension bro and moms side Alzheiners mom died from this  DM pgm      No FH of Colon Cancer: Family History of Stomach Cancer:paternal grandfather sister has osteoporis.    Social History: Reviewed history from 08/14/2009 and no changes required. 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         recurrent sore throat as per HPI  sometimes takes pills with very little water. But denies pill dysphasia  Physical Exam  General:  Well-developed,well-nourished,in no acute distress; alert,appropriate and cooperative throughout examination Head:  normocephalic and atraumatic.   Eyes:  vision grossly intact.   Ears:  R ear normal.   Neck:  No deformities, masses, or tenderness noted. Lungs:  Normal respiratory effort, chest expands symmetrically. Lungs are clear to auscultation, no crackles or wheezes. Heart:  Normal rate and regular rhythm. S1 and S2 normal without gallop, murmur, click, rub or other extra sounds. Abdomen:  Bowel sounds positive,abdomen soft and non-tender without masses, organomegaly or  noted.  points to left lower quadrant is sometimes  tender Msk:  osteoarthritis changes enhance no redness or warmth Pulses:  pulses intact without delay   Extremities:  no clubbing cyanosis or slight edema right ankle Neurologic:  alert & oriented X3 and gait normal.   Skin:  turgor normal, color normal, no ecchymoses, and no petechiae.   Cervical Nodes:  No lymphadenopathy noted Psych:  Oriented X3, normally interactive, good eye contact, not anxious appearing, and not depressed appearing.   more animated in nad    Impression & Recommendations:  Problem # 1:  HYPERTENSION (ICD-401.9) Assessment Unchanged adequate control still Her updated medication list for this problem includes:    Lopressor 50 Mg Tabs (Metoprolol tartrate) .Marland Kitchen... 1/2 by mouth two times a day    Amlodipine Besylate 5 Mg Tabs (Amlodipine besylate) ..... Once daily  Orders: TLB-BMP (Basic Metabolic Panel-BMET) (80048-METABOL) Specimen Handling (78295) Venipuncture (62130)  BP today: 120/80 Prior BP: 140/80 (12/13/2009)  10 Yr Risk  Heart Disease: 5 % Prior 10 Yr Risk Heart Disease: 4 % (10/10/2009)  Labs Reviewed: K+: 5.0 (11/10/2009) Creat: : 0.7 (11/10/2009)   Chol: 152 (11/10/2009)   HDL: 75.80 (11/10/2009)   LDL: 63 (11/10/2009)   TG: 66.0 (11/10/2009)  Problem # 2:  FIBROMYALGIA (ICD-729.1) some improvements in her pain on Cymbalta higher dose. Her updated medication list for this problem includes:    Tramadol Hcl 50 Mg Tabs (Tramadol hcl) .Marland Kitchen... 1 by mouth three times a day as needed pain.  Problem # 3:  OSTEOARTHRITIS (ICD-715.90) some improvement in her pain on hundred and 20 mg as Cymbalta Her updated medication list for this problem includes:    Tramadol Hcl 50 Mg Tabs (Tramadol hcl) .Marland Kitchen... 1 by mouth three times a day as needed pain.  Problem # 4:  ADJUSTMENT DISORDER WITH DEPRESSED MOOD (ICD-309.0) Assessment: Improved doing well  improved on higher does Cymbalta. Her weight is increased which may be appetite change. She is aware of  interventions she will take  Problem # 5:  history of renal failure continuing to avoid NSA IDs.   will check chemistries today  Problem # 6:  sore throat recurrent sounds like possible esophageal issue. She may want to go back and see Dr. Cloria Spring   take plenty of water with her pills  Complete Medication List: 1)  Crestor 40 Mg Tabs (Rosuvastatin calcium) .Marland Kitchen.. 1 by mouth once daily 2)  Lorazepam 0.5 Mg Tabs (Lorazepam) .... Take 1 tablet by mouth twice a day as needed for stress 3)  Lopressor 50 Mg Tabs (Metoprolol tartrate) .... 1/2 by mouth two times a day 4)  Ambien 10 Mg Tabs (Zolpidem tartrate) .Marland Kitchen.. 1 by mouth at bedtime 5)  Amlodipine Besylate 5 Mg Tabs (Amlodipine besylate) .... Once daily 6)  Ketoconazole 2 % Crea (Ketoconazole) .... Apply three times a day as needed 7)  Drisdol 16109 Unit Caps (Ergocalciferol) .Marland Kitchen.. 1 by mouth q 2  weeks or as directed 8)  Prilosec 20 Mg Cpdr (Omeprazole) .Marland Kitchen.. 1 by mouth two times a day  or as directed 9)  Cymbalta 60 Mg Cpep (Duloxetine hcl) .... 2  by mouth once daily or as directed 10)  Tramadol Hcl 50 Mg Tabs (Tramadol hcl) .Marland Kitchen.. 1 by mouth three times a day as needed pain.  Other Orders: Admin 1st Vaccine (60454) Flu Vaccine 110yrs + [phone])  Hypertension Assessment/Plan:      The patient's hypertensive risk group is category B: At least one risk factor (excluding diabetes) with no target organ damage.  Her calculated 10 year risk of coronary heart disease is 5 %.  Today's blood pressure is 120/80.  Her blood pressure goal is < 140/90.  Patient Instructions: 1)  avoid more weight gain.    2)  dietary  awareness.      now  3)  You need to use up 3500 calories more than intake to lose one pound of body weight.  4)  Continue   on  same meds  5)  if labs are ok then   6)  Check up in 3-4 months   will do labs at visit as needed.   Prescriptions: CYMBALTA 60 MG CPEP (DULOXETINE HCL) 2  by mouth once daily or as directed  #60 x 4   Entered and  Authorized by:   Madelin Headings MD   Signed by:   Madelin Headings MD on 02/08/2010   Method used:   [facility] [phone]* (retail)       [address]       [place], [place]  [address]       Ph: [phone] or [phone]       Fax: [phone]   RxID:   [id number] LORAZEPAM 0.5 MG TABS (LORAZEPAM) Take 1 tablet by mouth twice a day as needed for stress  #60 x 1   Entered and Authorized by:   Madelin Headings MD   Signed by:   Madelin Headings MD on 02/08/2010   Method used:   Print then Give to Patient   RxID:   [id number]    Orders Added: 1)  Admin 1st Vaccine [90471] 2)  Flu Vaccine 12yrs + [35573] 3)  TLB-BMP (Basic Metabolic Panel-BMET) [80048-METABOL] 4)  Specimen Handling [99000] 5)  Venipuncture [36415] 6)  Est. Patient Level IV [22025]  min 1st Vaccine [42706] Flu Vaccine Consent Questions     Do you have a history of severe allergic reactions to this vaccine? no    Any prior history of allergic reactions to egg and/or gelatin? no    Do you have a sensitivity to the preservative Thimersol? no    Do you have a past history of Guillan-Barre Syndrome? no    Do you currently have an acute febrile illness? no    Have you ever had a severe reaction to latex? no    Vaccine information given and explained to patient? yes    Are you currently pregnant? no    Lot Number:[id number]   Exp Date:10/20/2010   Site Given  Left Deltoid IM Romualdo Bolk, CMA (AAMA)  February 08, 2010 9:28 AM   accine 57yrs + [23762]       .[email or web address]

## 2010-05-24 NOTE — Progress Notes (Signed)
Summary: refill on zolpidem  Phone Note From Pharmacy   [phone] Reason for Call: Needs renewal Details for Reason: zolpidem 10mg  Summary of Call: last filled on 04/20/10 #30 Initial call taken by: Romualdo Bolk, CMA (AAMA),  May 17, 2010 10:03 AM  Follow-up for Phone Call        ok x 2  Follow-up by: Madelin Headings MD,  May 17, 2010 10:17 PM  Additional Follow-up for Phone Call Additional follow up Details #1::        Rx faxed back to pharmacy Additional Follow-up by: Romualdo Bolk, CMA Duncan Dull),  May 18, 2010 8:41 AM    Prescriptions: AMBIEN 10 MG  TABS (ZOLPIDEM TARTRATE) 1 by mouth at bedtime  #30 x 1   Entered by:   Romualdo Bolk, CMA (AAMA)   Authorized by:   Madelin Headings MD   Signed by:   Romualdo Bolk, CMA (AAMA) on 05/18/2010   Method used:   Handwritten   RxID:   [id number]

## 2010-05-30 NOTE — Progress Notes (Signed)
Summary: refill on lorazepam  Phone Note From Pharmacy   Caller: [facility]  [address] [id number] Reason for Call: Needs renewal Details for Reason: Lorazepam 0.5mg  Summary of Call: last filled on 12/07/2009 #60 Initial call taken by: Romualdo Bolk, CMA (AAMA),  May 21, 2010 12:41 PM  Follow-up for Phone Call        ok x 1  Follow-up by: Madelin Headings MD,  May 21, 2010 5:05 PM  Additional Follow-up for Phone Call Additional follow up Details #1::        Rx faxed to the pharmacy Additional Follow-up by: Romualdo Bolk, CMA Duncan Dull),  May 22, 2010 8:41 AM    Prescriptions: LORAZEPAM 0.5 MG TABS (LORAZEPAM) Take 1 tablet by mouth twice a day as needed for stress  #60 x 0   Entered by:   Romualdo Bolk, CMA (AAMA)   Authorized by:   Madelin Headings MD   Signed by:   Romualdo Bolk, CMA (AAMA) on 05/22/2010   Method used:   Handwritten   RxID:   [id number]

## 2010-06-15 ENCOUNTER — Other Ambulatory Visit: Payer: Self-pay | Admitting: Internal Medicine

## 2010-06-18 NOTE — Telephone Encounter (Signed)
Ok to refill x 2   She is due for a visit . Last labs  Were July.  Please make sure she  schedules  follow up appt.

## 2010-06-24 ENCOUNTER — Other Ambulatory Visit: Payer: Self-pay | Admitting: Internal Medicine

## 2010-06-25 NOTE — Telephone Encounter (Signed)
Mailed letter stating that pt needs a follow up appt.

## 2010-07-16 LAB — BASIC METABOLIC PANEL
BUN: 52 mg/dL — ABNORMAL HIGH (ref 6–23)
CO2: 21 mEq/L (ref 19–32)
Chloride: 105 mEq/L (ref 96–112)
Glucose, Bld: 87 mg/dL (ref 70–99)
Potassium: 3.4 mEq/L — ABNORMAL LOW (ref 3.5–5.1)

## 2010-07-16 LAB — URINE CULTURE: Colony Count: 2000

## 2010-07-16 LAB — CULTURE, BLOOD (ROUTINE X 2): Culture: NO GROWTH

## 2010-07-16 LAB — RENAL FUNCTION PANEL
Albumin: 2.3 g/dL — ABNORMAL LOW (ref 3.5–5.2)
BUN: 28 mg/dL — ABNORMAL HIGH (ref 6–23)
Calcium: 8.3 mg/dL — ABNORMAL LOW (ref 8.4–10.5)
Chloride: 115 mEq/L — ABNORMAL HIGH (ref 96–112)
Creatinine, Ser: 1.72 mg/dL — ABNORMAL HIGH (ref 0.4–1.2)
Creatinine, Ser: 2.66 mg/dL — ABNORMAL HIGH (ref 0.4–1.2)
GFR calc Af Amer: 22 mL/min — ABNORMAL LOW (ref >60)
Glucose, Bld: 84 mg/dL (ref 70–99)
Glucose, Bld: 98 mg/dL (ref 70–99)
Phosphorus: 4.6 mg/dL (ref 2.3–4.6)
Sodium: 143 mEq/L (ref 135–145)

## 2010-07-16 LAB — IRON AND TIBC
Iron: 61 ug/dL (ref 42–135)
UIBC: 85 ug/dL

## 2010-07-16 LAB — MAGNESIUM: Magnesium: 1.3 mg/dL — ABNORMAL LOW (ref 1.5–2.5)

## 2010-07-16 LAB — DIFFERENTIAL
Lymphocytes Relative: 20 % (ref 12–46)
Lymphs Abs: 1.1 10*3/uL (ref 0.7–4.0)
Monocytes Relative: 7 % (ref 3–12)
Neutro Abs: 3.8 10*3/uL (ref 1.7–7.7)
Neutrophils Relative %: 70 % (ref 43–77)

## 2010-07-16 LAB — URINALYSIS, MICROSCOPIC ONLY
Glucose, UA: NEGATIVE mg/dL
Ketones, ur: NEGATIVE mg/dL
Protein, ur: 30 mg/dL — AB

## 2010-07-16 LAB — CBC
HCT: 30.6 % — ABNORMAL LOW (ref 36.0–46.0)
Hemoglobin: 10.1 g/dL — ABNORMAL LOW (ref 12.0–15.0)
MCHC: 33 g/dL (ref 30.0–36.0)
MCHC: 33 g/dL (ref 30.0–36.0)
RDW: 14.4 % (ref 11.5–15.5)
RDW: 15.4 % (ref 11.5–15.5)

## 2010-07-16 LAB — LIPID PANEL
HDL: 20 mg/dL — ABNORMAL LOW (ref >39)
Triglycerides: 112 mg/dL (ref <150)

## 2010-07-16 LAB — SEDIMENTATION RATE: Sed Rate: 65 mm/hr — ABNORMAL HIGH (ref 0–22)

## 2010-07-16 LAB — TSH: TSH: 2.523 u[IU]/mL (ref 0.350–4.500)

## 2010-07-16 LAB — COMPREHENSIVE METABOLIC PANEL
BUN: 43 mg/dL — ABNORMAL HIGH (ref 6–23)
Calcium: 8.5 mg/dL (ref 8.4–10.5)
Glucose, Bld: 96 mg/dL (ref 70–99)
Sodium: 142 mEq/L (ref 135–145)
Total Protein: 5.4 g/dL — ABNORMAL LOW (ref 6.0–8.3)

## 2010-07-16 LAB — FERRITIN: Ferritin: 475 ng/mL — ABNORMAL HIGH (ref 10–291)

## 2010-07-16 LAB — PHOSPHORUS
Phosphorus: 4.9 mg/dL — ABNORMAL HIGH (ref 2.3–4.6)
Phosphorus: 5.8 mg/dL — ABNORMAL HIGH (ref 2.3–4.6)

## 2010-07-16 LAB — SODIUM, URINE, RANDOM: Sodium, Ur: 36 mEq/L

## 2010-07-16 LAB — CREATININE, URINE, RANDOM: Creatinine, Urine: 26.9 mg/dL

## 2010-07-26 LAB — POCT CARDIAC MARKERS
CKMB, poc: 1.5 ng/mL (ref 1.0–8.0)
Troponin i, poc: <0.05 ng/mL (ref 0.00–0.09)

## 2010-07-26 LAB — POCT I-STAT, CHEM 8
BUN: 20 mg/dL (ref 6–23)
Chloride: 107 mEq/L (ref 96–112)
Glucose, Bld: 88 mg/dL (ref 70–99)
HCT: 32 % — ABNORMAL LOW (ref 36.0–46.0)
Potassium: 3.9 mEq/L (ref 3.5–5.1)

## 2010-07-27 ENCOUNTER — Other Ambulatory Visit: Payer: Self-pay | Admitting: Internal Medicine

## 2010-07-28 LAB — URINE MICROSCOPIC-ADD ON

## 2010-07-28 LAB — COMPREHENSIVE METABOLIC PANEL
ALT: 15 U/L (ref 0–35)
AST: 17 U/L (ref 0–37)
Albumin: 3.5 g/dL (ref 3.5–5.2)
Albumin: 3.4 g/dL — ABNORMAL LOW (ref 3.5–5.2)
Alkaline Phosphatase: 70 U/L (ref 39–117)
Calcium: 9.2 mg/dL (ref 8.4–10.5)
Creatinine, Ser: 1.17 mg/dL (ref 0.4–1.2)
GFR calc Af Amer: 56 mL/min — ABNORMAL LOW (ref >60)
Potassium: 3.3 mEq/L — ABNORMAL LOW (ref 3.5–5.1)
Sodium: 140 mEq/L (ref 135–145)
Total Protein: 6.8 g/dL (ref 6.0–8.3)
Total Protein: 6.8 g/dL (ref 6.0–8.3)

## 2010-07-28 LAB — DIFFERENTIAL
Basophils Relative: 1 % (ref 0–1)
Eosinophils Absolute: 0.1 10*3/uL (ref 0.0–0.7)
Eosinophils Relative: 1 % (ref 0–5)
Eosinophils Relative: 2 % (ref 0–5)
Lymphocytes Relative: 18 % (ref 12–46)
Lymphs Abs: 1.2 10*3/uL (ref 0.7–4.0)
Monocytes Absolute: 0.4 10*3/uL (ref 0.1–1.0)
Monocytes Absolute: 0.4 10*3/uL (ref 0.1–1.0)
Monocytes Relative: 4 % (ref 3–12)
Neutro Abs: 5.1 10*3/uL (ref 1.7–7.7)

## 2010-07-28 LAB — URINALYSIS, ROUTINE W REFLEX MICROSCOPIC
Bilirubin Urine: NEGATIVE
Glucose, UA: NEGATIVE mg/dL
Ketones, ur: NEGATIVE mg/dL
Ketones, ur: NEGATIVE mg/dL
Nitrite: NEGATIVE
pH: 7 (ref 5.0–8.0)
pH: 5 (ref 5.0–8.0)

## 2010-07-28 LAB — CK TOTAL AND CKMB (NOT AT ARMC)
CK, MB: 1.6 ng/mL (ref 0.3–4.0)
Relative Index: INVALID (ref 0.0–2.5)
Total CK: 66 U/L (ref 7–177)

## 2010-07-28 LAB — CBC
HCT: 33.8 % — ABNORMAL LOW (ref 36.0–46.0)
Hemoglobin: 11.8 g/dL — ABNORMAL LOW (ref 12.0–15.0)
Platelets: 189 10*3/uL (ref 150–400)
Platelets: 181 10*3/uL (ref 150–400)
RDW: 16 % — ABNORMAL HIGH (ref 11.5–15.5)
WBC: 10.4 10*3/uL (ref 4.0–10.5)
WBC: 7 10*3/uL (ref 4.0–10.5)

## 2010-07-28 LAB — URINE CULTURE: Colony Count: 50000

## 2010-07-30 ENCOUNTER — Other Ambulatory Visit: Payer: Self-pay | Admitting: *Deleted

## 2010-07-30 NOTE — Telephone Encounter (Signed)
Rx faxed back to pharmacy

## 2010-07-30 NOTE — Telephone Encounter (Signed)
Last filled on 06/28/10 #30 Pt is due for a follow up appt. Letter sent to pt.

## 2010-08-02 ENCOUNTER — Ambulatory Visit (INDEPENDENT_AMBULATORY_CARE_PROVIDER_SITE_OTHER): Payer: MEDICARE | Admitting: Internal Medicine

## 2010-08-02 ENCOUNTER — Other Ambulatory Visit: Payer: Self-pay | Admitting: Internal Medicine

## 2010-08-02 ENCOUNTER — Encounter: Payer: Self-pay | Admitting: Internal Medicine

## 2010-08-02 VITALS — BP 132/84 | HR 121 | Temp 98.3°F | Ht 62.5 in | Wt 172.0 lb

## 2010-08-02 DIAGNOSIS — L299 Pruritus, unspecified: Secondary | ICD-10-CM

## 2010-08-05 ENCOUNTER — Encounter: Payer: Self-pay | Admitting: Internal Medicine

## 2010-08-05 DIAGNOSIS — L299 Pruritus, unspecified: Secondary | ICD-10-CM | POA: Insufficient documentation

## 2010-08-05 NOTE — Assessment & Plan Note (Signed)
Resolved. No clinical evidence to suggest renal failure. Discussed obtaining chem7 however pt reassured and will make f/u appt with pmd.

## 2010-08-05 NOTE — Progress Notes (Signed)
  Subjective:    Patient ID: [name] [name], female    DOB: Apr 17, 1941, 70 y.o.   MRN: [id number]  HPI Pt presents to clinic for evaluation of itching. Notes yesterday developing itching without rash in truncal distribution. Denies trigger, new medication, chemical exposure or change in soaps/detergents. Itching resolved and has no active complaints. Pt called insurance nurse hotline for advice and was told itching could be a possible sign of kidney failure and that with her h/o ARF in the past. Chart reviewed with hospitalization noted last year for ARF now resolved. Reviewed last creatinine remains nl. No mental status changes, clinical evidence of volume depletion or change in urination. No use of nsaids. No active complaint.   Total time of visit ~ 30 minutes of which greater than 50% was spent in counseling.  Reviewed pmh, medications and allergies.    Review of Systems  Constitutional: Negative for fever, chills, diaphoresis and fatigue.  Genitourinary: Negative for dysuria, hematuria, decreased urine volume and difficulty urinating.  Skin: Negative for color change, pallor and rash.  Neurological: Negative for dizziness, tremors and syncope.       Objective:   Physical Exam  Vitals reviewed. Constitutional: She appears well-developed and well-nourished. No distress.  HENT:  Head: Normocephalic and atraumatic.  Right Ear: External ear normal.  Left Ear: External ear normal.  Nose: Nose normal.  Eyes: Conjunctivae are normal. No scleral icterus.  Neurological: She is alert.       No asterexis  Skin: She is not diaphoretic.          Assessment & Plan:

## 2010-08-08 ENCOUNTER — Ambulatory Visit: Payer: MEDICARE | Admitting: Internal Medicine

## 2010-09-04 MED ORDER — ZOLPIDEM TARTRATE 10 MG PO TABS: 10.0000 mg | ORAL_TABLET | Freq: Every evening | ORAL | Status: DC | PRN

## 2010-09-04 NOTE — Consult Note (Signed)
NEW PATIENT CONSULTATION   RYKA, BEIGHLEY B  DOB:  02/25/1941                                       09/05/2008  ZOXWR#:[id number]   The patient is a 70 year old female referred for severe venous  insufficiency of both lower extremities.  This patient had a history of  thrombophlebitis on two previous occasions in the left leg treated by  Dr. Cari Caraway, required heparinization but no Coumadin on one  occasion.  She has no history of deep venous thrombosis.  She has had  significant aching, burning, throbbing and cramping discomfort in both  legs, particularly in the left medial calf posteriorly and the right  thigh and knee and calf area.  She has some tenderness in the distal  great saphenous vein on the left side and some darkening of the skin  which has progressed over the last few years and has remained mildly  tender.  She has no history of bleeding but has had a stasis ulcer in  the left ankle 5 years ago which eventually healed.  She has not worn  elastic compression stockings, elevating the leg does help and does not  take pain medicine on a regular basis.   PAST HISTORY:  1. Hypertension.  2. Hyperlipidemia.  3. Fibromyalgia.  4. IBS.  5. Interstitial cystitis.  6. Negative for diabetes, coronary artery disease, COPD or stroke.   PAST SURGICAL HISTORY:  1. Left knee replacement.  2. Ruptured lumbar disk x2.  3. Right shoulder fracture.  4. Cholecystectomy.   FAMILY HISTORY:  Positive for coronary artery disease in her mother and  brother, stroke in a grandmother, diabetes in a grandmother.   SOCIAL HISTORY:  She is widowed and has 2 children, is unemployed.  Does  not use tobacco or alcohol.   REVIEW OF SYSTEMS:  Occasional chest discomfort, bronchitis, reflux  esophagitis, abdominal discomfort, lower extremity discomfort,  arthritis, joint pain, muscle pain, depression.   ALLERGIES:  Sulfa.   MEDICATIONS:  Please see health history  form.   PHYSICAL EXAMINATION:  Blood pressure 142/80, heart rate 70,  respirations 14.  General:  She is a female patient in no apparent  distress, alert and oriented x3.  Neck:  Supple, 3+ carotid pulses  palpable.  No bruits are audible.  Neurologic:  Normal.  No palpable  adenopathy in the neck.  Chest:  Clear to auscultation.  Cardiovascular:  Regular rhythm, no murmurs.  Abdomen:  Soft, nontender with no masses.  She has 3+ femoral popliteal and dorsalis pedis pulses bilaterally.  Right leg has severe greater saphenous varicosities extending down the  right medial thigh into the knee and down into the pretibial area with  multiple spider and reticular veins covering the lower third of the leg  on the foot.  There is 1+ edema on the right.  Left leg has large  bulging varicosities in the posterior calf up to the popliteal space.  There is some hyperpigmentation medially over the distal great saphenous  vein on the left and mild tenderness with 1+ edema.   Venous duplex exam revealed the following findings:  1. Both deep venous systems were patent.  2. There is mild reflux in deep systems.  3. There is gross reflux in the right great saphenous vein feeding      these varicosities up  to the  saphenofemoral junction.  4. Gross reflux in the left small saphenous vein feeding the      varicosities in the left calf.  5. The left great saphenous vein does have reflux but the smaller vein      is noted.   I think the patient should be treated for 3 months with long-leg elastic  compression stockings, elevation, analgesics and if there is no  improvement we should perform the following procedures.  1. Laser ablation of the right great saphenous vein with multiple stab      phlebectomies followed by 2 courses of sclerotherapy.  2. Laser ablation of the left small saphenous vein with multiple stab      phlebectomies followed by 1 course of sclerotherapy.  She will      return in 3 months  for further follow up.   [name], M.D.  Electronically Signed   JDL/MEDQ  D:  09/05/2008  T:  09/06/2008  Job:  [id number]   cc:   Neta Mends. Fabian Sharp, MD

## 2010-09-04 NOTE — Procedures (Signed)
LOWER EXTREMITY VENOUS REFLUX EXAM   INDICATION:  Varicose veins.   EXAM:  Using color-flow imaging and pulse Doppler spectral analysis, the  bilateral common femoral, superficial femoral, popliteal, posterior  tibial, greater and lesser saphenous veins are evaluated.  There is  evidence suggesting deep venous insufficiency in the bilateral lower  extremities.   The bilateral saphenofemoral junctions are not competent.  The bilateral  GSV's are not competent with the caliber as described below.   The left proximal short saphenous vein demonstrates incompetency with  diameter measurements ranging from 0.59 to 0.85 cm.  The right proximal  short saphenous vein was not adequately visualized.   GSV Diameter (used if found to be incompetent only)                                            Right    Left  Proximal Greater Saphenous Vein           0.56 cm  0.47 cm  Proximal-to-mid-thigh                     cm       cm  Mid thigh                                 0.59 cm  0.3 cm  Mid-distal thigh                          cm       cm  Distal thigh                              0.33 cm  0.24 cm  Knee                                      0.28 cm  0.31 cm   IMPRESSION:  1. Bilateral greater saphenous vein reflux is identified with the      calibers as described above and on the attached worksheet.  2. The bilateral greater saphenous veins are not tortuous.  3. The deep venous system is not competent.  4. The left lesser saphenous vein is not competent.   ___________________________________________  Quita Skye. Hart [place], M.D.   CH/MEDQ  D:  09/05/2008  T:  09/05/2008  Job:  [id number]

## 2010-09-04 NOTE — Procedures (Signed)
DUPLEX DEEP VENOUS EXAM - LOWER EXTREMITY   INDICATION:  Post 1 week laser ablation of the right great saphenous  vein.   HISTORY:  Edema:  Yes.  Trauma/Surgery:  EVLT.  Pain:  Yes.  PE:  No.  Previous DVT:  No.  Anticoagulants:  No.  Other:   DUPLEX EXAM:                CFV   SFV   PopV  PTV    GSV                R  L  R  L  R  L  R   L  R  L  Thrombosis    o  o  o     o     o      +  Spontaneous   +  +  +     +     +      0  Phasic        +  +  +     +     +      0  Augmentation  +  +  +     +     +      0  Compressible  +  +  +     +     +      0  Competent     +  +  +     +     +      0   Legend:  + - yes  o - no  p - partial  D - decreased   IMPRESSION:  No evidence of deep venous thrombosis identified, right  great saphenous vein ablated from the saphenofemoral junction to the  distal thigh.    _____________________________  [name], M.D.   CJ/MEDQ  D:  03/07/2009  T:  03/07/2009  Job:  [id number]

## 2010-09-04 NOTE — Assessment & Plan Note (Signed)
OFFICE VISIT   [name] [name], [name] [name]  DOB:  1941/03/06                                       03/07/2009  ZOXWR#:[id number]   The patient is 1 week post laser ablation of her right great saphenous  vein with multiple stab phlebectomies in the thigh and calf.  She has  noted less heaviness and aching and throbbing in her calf area.  She has  mild tenderness along the course of the great saphenous vein from the  mid thigh proximally.  There is some bruising.  She is wearing her long-  leg stocking and is taking ibuprofen as prescribed.   Venous duplex exam reveals no evidence of deep venous obstruction with  total closure of the right great saphenous vein from the saphenofemoral  junction to the distal thigh.  On exam she has mild edema in the right  ankle with some mild bruising in the distal medial thigh as one would  expect.  There is some mild tenderness along the course of the saphenous  vein in the groin and thigh area.   She is reassured regarding this and will return in the near future for  laser ablation of her left small saphenous vein with multiple stab  phlebectomies to be followed by sclerotherapy on both legs.   [name], M.D.  Electronically Signed   JDL/MEDQ  D:  03/07/2009  T:  03/08/2009  Job:  [id number]

## 2010-09-04 NOTE — Assessment & Plan Note (Signed)
[facility]                            CARDIOLOGY OFFICE NOTE   NAME:[name], [name] BESKE                         MRN:          [id number]  DATE:03/04/2008                            DOB:          1940-07-25    PRIMARY CARE PHYSICIAN:  [name] Mends. Panosh, MD.   REASON FOR CONSULTATION:  Evaluate patient with chest pain.   HISTORY OF PRESENT ILLNESS:  The patient is a pleasant 70 year old white  female.  She did see [place] in 2004 for chest discomfort.  Her stress  perfusion study was negative.  She had an echocardiogram, which was  normal essentially.  The patient recently lost her husband suddenly due  to a myocardial infarction.  She said that he and she had both  complained about chest discomfort over the preceding months.  She  thought they both had reflux.  Indeed, she may have an element of this.  She does get chest discomfort with certain foods.  She gets discomfort  that is up into her throat and burning.  She does take Aciphex, but  still is bothered by this.  She is fairly limited in her activities from  back pain and knee discomfort.  She can vacuum.  She had been getting  progressive fatigue with this.  She is not really clear whether the  chest discomfort gets worse as she does have musculoskeletal pains as  well and has a difficult time differentiating.  She will get nauseated.  She does not describe diaphoresis.  She does have shortness of breath  with moderate activity, but no resting shortness of breath and denies  any PND or orthopnea.  She has palpitations at night.  These are not  associated with presyncope or syncope.   PAST MEDICAL HISTORY:  Dyslipidemia (LDL 179, HDL 55, total 259, and  triglycerides 91), gastroesophageal reflux, hiatal hernia,  osteoarthritis, hypertension, and anxiety.  Two previous deep venous  thromboses in the left leg, apparently.   PAST SURGICAL HISTORY:  Knee surgery x2, shoulder surgery,  cholecystectomy, and  repair of ruptured disk.   ALLERGIES:  SULFA.   MEDICATIONS:  1. Aciphex 20 mg b.i.d.  2. Meloxicam 15 mg daily.  3. Lipitor 40 mg daily.  4. Metoprolol 50 mg b.i.d.  5. Zolpidem 5 mg nightly.  6. Lorazepam 5 mg nightly.  7. Optivar.  8. Antibiotic.   SOCIAL HISTORY:  The patient is now a widow.  She does not smoke  cigarettes.  She does not drink alcohol.  She has 2 children.   FAMILY HISTORY:  Contributory for a brother in his 52s with clogged  arteries.  Otherwise, negative for early heart disease.   REVIEW OF SYSTEMS:  As stated in the HPI, positive for irritable bowel  syndrome, fatigue, some difficulty swallowing, nighttime leg cramps,  joint pains, and severe varicose veins.  Negative for all other systems.   PHYSICAL EXAMINATION:  GENERAL:  The patient is in no distress.  She is  somewhat tearful, recounting the death of her husband.  VITAL SIGNS:  Blood pressure 120/80, heart rate 65  and regular, weight  177 pounds, and body mass index 30.  HEENT:  Eyelids are unremarkable.  Pupils equal, round, and reactive to  light.  Fundi within normal limits.  Oral mucosa unremarkable.  NECK:  No jugular venous distention at 45 degrees.  Carotid upstroke  brisk and symmetrical.  No bruits.  No thyromegaly.  LYMPHATICS:  No cervical, axillary, or inguinal adenopathy.  LUNGS:  Clear to auscultation bilaterally.  BACK:  No  costovertebral angle tenderness.  CHEST:  Unremarkable.  HEART:  PMI not displaced or sustained.  S1 and S2 within normal limits.  No S3, no S4, no clicks, no rubs, and no murmurs.  ABDOMEN:  Obese.  Positive bowel sounds normal in frequency and pitch.  No bruits, no rebound, no guarding, no midline pulsatile mass, no  hepatomegaly, and no splenomegaly.  SKIN:  No rashes and no nodules.  EXTREMITIES:  2+ pulses throughout.  No edema, no cyanosis, and no  clubbing.  Varicose veins.  NEUROLOGIC:  Oriented to person, place, and time.  Cranial nerves II-XII   grossly intact.  Motor grossly intact.   EKG sinus rhythm, rate 65.  Axis within normal limits, intervals within  normal limits, low voltage in the limb leads, and no acute ST-wave  changes.   ASSESSMENT AND PLAN:  1. Chest discomfort.  The patient has chest discomfort with some      typical and some atypical features.  Clearly, she has some      gastrointestinal component.  However, it is impossible to sort out      whether there might not be an anginal component.  She is fatigued      with activity, and this is clearly progressive.  She does have      dyspnea as well.  Given all of this, she needs screening with a      stress test.  However, she says she would not be able to walk on a      treadmill with her multiple back and orthopedic complaints.      Therefore, she will need an adenosine Cardiolite.  Further      evaluation based on these results.  2. Dyslipidemia.  Despite being on 40 mg of Lipitor, she has the lipid      panel described above.  Therefore, I am going to take the liberty      of switching her to Crestor 40 mg daily.  She will need lipid and      liver in 8 weeks.  She will also need concentrated effort on diet.  3. Hypertension.  Blood pressure is currently controlled on the      medications listed.  4. Reflux.  The patient is due to see a gastroenterologist soon.  5. Weight.  Her body mass index puts her just over the obese range.      She understands the need to lose weight with diet and exercise.  6. Followup.  I will see her back based on results of the above.  Of      note, I did take the time to review extensively the physiology of      the ruptured plaque, which may have contributed to her husband's      sudden myocardial infarction silently and may have been very      uncomfortable at the moment of his myocardial infarction.      Hopefully, this relieves any anxiety.     Rollene Rotunda, MD, [facility]  Electronically Signed    JH/MedQ  DD: 03/04/2008   DT: 03/05/2008  Job #: [id number]   cc:   [name] Mends. Fabian Sharp, MD

## 2010-09-04 NOTE — Assessment & Plan Note (Signed)
OFFICE VISIT   WESLYNN, KE  DOB:  02-28-1941                                       12/12/2008  VWUJW#:[id number]   The patient  returns after 3 months of conservative treatment for her  severe venous insufficiency in both lower extremities.  She continues to  have aching, burning and throbbing discomfort in both legs and  particularly in the left medial calf posteriorly and in the right thigh  and knee area medially.  She just had previous thrombophlebitis in the  left great saphenous vein distally and continues to have some mild  tenderness over this area between the knee and ankle.  She has chronic  edema and chronic reticular veins around both ankle areas.  She has worn  her long-leg elastic compression stockings and has tried elevation of  her legs as well as a daily ibuprofen with no improvement in her  symptomatology.   On exam she continues to have bulging varicosities along the great  saphenous system of the right leg and the thigh and medial calf as well  as a left small saphenous distribution in the posterior calf and medial  calf.   I feel the best plan for her would be the following:  1. Laser ablation of her right great saphenous vein with stab      phlebectomies to be followed by two courses of sclerotherapy.  2. Laser ablation of the left small saphenous vein with stab      phlebectomies to be followed by one course of sclerotherapy.   We will proceed with precertification for these two procedures and to  try to achieve this in the near future.   [name], M.D.  Electronically Signed   JDL/MEDQ  D:  12/12/2008  T:  12/13/2008  Job:  [id number]

## 2010-09-04 NOTE — Assessment & Plan Note (Signed)
OFFICE VISIT   BLESSIN, KANNO  DOB:  18-Feb-1941                                       04/25/2009  ZOXWR#:[id number]   Ms. Bardwell returns 2-1/2 weeks post-laser ablation of the left small  saphenous vein with multiple stab phlebectomies for painful varicosities  in the left posterior calf area.  She had some mild discomfort along the  course of the small saphenous vein, but no change in the distal edema  and no pain at the stab phlebectomy sites.  She has been wearing her  elastic compression stocking.  A venous duplex today reveals no evidence  of deep venous obstruction and total closure of the left small saphenous  vein.  On examination, there is mild tenderness along the course of the  small saphenous vein as one would expect, but no distal edema.  Blood  pressure 132/84, heart rate 81, respirations 24.  She was reassured  regarding these findings.  We will now schedule her for her 2 final  sessions of sclerotherapy to complete her treatment.     [name], M.D.  Electronically Signed   JDL/MEDQ  D:  04/25/2009  T:  04/26/2009  Job:  [id number]

## 2010-09-04 NOTE — Procedures (Signed)
DUPLEX DEEP VENOUS EXAM - LOWER EXTREMITY   INDICATION:  Follow-up 1 week post laser ablation.   HISTORY:  Edema:  No  Trauma/Surgery:  1-week status post laser ablation.  Pain:  Yes  PE:  No  Previous DVT:  No  Anticoagulants:  No  Other:   DUPLEX EXAM:                CFV        SFV      PopV     PTV     GSV                R     L    R  L     R  L     R  L    R   L  Thrombosis    0     0       0        0        0        0  Spontaneous   +all the way down   +all the way down         + all the  way down            +all the way down         + all the way down  + all the way down  Phasic  Augmentation  Compressible  Competent   Legend:  + - yes  o - no  p - partial  D - decreased   IMPRESSION:  There does not appear to be any deep venous thrombus noted  in the left leg.  The left lesser saphenous vein appears with thrombus  status post laser ablation from popliteal to calf level.    _____________________________  Quita Skye. Hart [place], M.D.   CB/MEDQ  D:  04/25/2009  T:  04/26/2009  Job:  [id number]

## 2010-09-07 NOTE — H&P (Signed)
NAME:  [name], [name] NO.:  [id number]   MEDICAL RECORD NO.:  [id number]                   PATIENT TYPE:  INP   LOCATION:  NA                                   FACILITY:  [facility]   PHYSICIAN:  Erasmo Leventhal, M.D.         DATE OF BIRTH:  [birth year]   DATE OF ADMISSION:  12/03/2002  DATE OF DISCHARGE:                                HISTORY & PHYSICAL   CHIEF COMPLAINT:  Bilateral knee osteoarthritis, left greater than right.   HISTORY OF PRESENT ILLNESS:  This is a 70 year old female with a history of  osteoarthritis in both knees, left greater than right.  She has had  conservative treatment to the knee with failure of conservative treatment to  keep her symptoms in a tolerable range.  She has a history of superficial  phlebitis but no history of DVT to her leg.  She has been cleared for  surgery for Dr. Edilia Bo and also cleared by Dr. Fabian Sharp medically for  surgery.  After discussion of treatment options, risks and benefits, and  aftercare, the patient is now scheduled for total knee arthroplasty of the  left knee.   ALLERGIES:  SULFA DRUGS.   MEDICATIONS:  1. Toprol XL 1 daily.  2. Bextra 20 mg 1 daily.  3. Zoloft 100 mg daily.  4. Ambien 10 mg q.h.s.  5. Flexeril 10 mg 1 daily.  6. Aciphex 20 mg 1 b.i.d.   PAST MEDICAL HISTORY:  1. Superficial phlebitis, left leg.  2. GERD.  3. Hypertension.  4. Panic disorder.  5. Interstitial cystitis.  6. Fibromyalgia.   PAST SURGICAL HISTORY:  1. Laminectomy x 2.  2. Bilateral knee arthroscopy.  3. Cholecystectomy.   FAMILY HISTORY:  Positive for coronary artery disease, hypertension,  diabetes, cancer, and stroke.   SOCIAL HISTORY:  The patient is married.  She lives at home.  She does not  smoke or drink.   REVIEW OF SYSTEMS:  CONSTITUTIONAL: Positive for panic disorder.  Negative  for headache, blurry vision, or dizziness.  PULMONARY: Positive for history  of bronchitis.  Negative  for shortness of breath, PND, and orthopnea.  CARDIOVASCULAR:  Positive for hypertension.  Negative for chest pain or  palpitations.  GI: Positive for GERD.  Negative for ulcers.  GU: Positive  for interstitial cystitis.  MUSCULOSKELETAL: Positive as in HPI.   PHYSICAL EXAMINATION:  VITAL SIGNS:  Blood pressure 130/92, respirations 16,  pulse 80 and regular.  GENERAL:  Well developed, well nourished lady in no acute distress.  HEENT:  Head normocephalic.  Nose patent.  Ears patent. Pupils are equal,  round, and reactive to light.  Throat without injection.  NECK:  Supple without adenopathy.  Carotids 2+ without bruits.  CHEST:  Clear to auscultation without rales or rhonchi.  Respirations 16.  HEART:  Regular rate and rhythm at 80 beats per minute without murmur.  ABDOMEN:  Soft with active bowel sounds.  No masses or organomegaly.  NEUROLOGIC:  Alert and oriented to time, place, and person.  Cranial nerves  II-XII grossly intact.  EXTREMITIES:  Within normal limits with exception of the knees which showed  windswept knees with a valgus left knee, varus right knee. Her left knee  shows 0 to 130 degree range of motion.  Dorsalis pedis and posterior  tibialis pulses are 2+.  Sensation and circulation are grossly intact.  Medial calf and ankle of the left leg shows firmness with some hemosiderin  deposition in the skin.  No cords and negative Homan's sign.   LABORATORY DATA:  X-rays show end-stage osteoarthritis, left knee.   IMPRESSION:  End-stage osteoarthritis, left knee.   PLAN:  Total knee replacement, left knee.      Jaquelyn Bitter. Chabon, P.A.                   Erasmo Leventhal, M.D.    SJC/MEDQ  D:  12/03/2002  T:  12/04/2002  Job:  [id number]

## 2010-09-07 NOTE — Op Note (Signed)
NAME:  DELORESE, SELLIN NO.:  [id number]   MEDICAL RECORD NO.:  [id number]                   PATIENT TYPE:  INP   LOCATION:  NA                                   FACILITY:  [facility]   PHYSICIAN:  Erasmo Leventhal, M.D.         DATE OF BIRTH:  14-Feb-1941   DATE OF PROCEDURE:  12/03/2002  DATE OF DISCHARGE:                                 OPERATIVE REPORT   PREOPERATIVE DIAGNOSIS:  Left knee end-stage osteoarthritis.   POSTOPERATIVE DIAGNOSIS:  Left knee end-stage osteoarthritis.   PROCEDURE:  Left total knee arthroplasty.   SURGEON:  Erasmo Leventhal, M.D.   ASSISTANT:  Jaquelyn Bitter. Chabon, P.A.   ANESTHESIA:  General with femoral nerve block.   ESTIMATED BLOOD LOSS:  Less than 100 mL.   DRAINS:  Two medium Hemovac.   COMPLICATIONS:  None.   TOURNIQUET TIME:  1 hour 50 minutes at 350 mmHg.   COMPLICATIONS:  None.   DISPOSITION:  To PACU stable.   OPERATIVE IMPLANTS:  Osteonics components, all cemented.  Size 7 femur, size  7 tibia, 10 mm flex posterior-stabilized tibial insert with a 26 mm patella,  all cemented.  Posterior-stabilized.   OPERATIVE DETAILS:  The patient was counseled in the holding area, the  correct side was identified, IV steroids and antibiotics were given.  Taken  to the operating room and placed in the supine position with general  anesthesia.  A Foley catheter was placed utilizing sterile technique by the  OR circulating nurse.  Range of motion was examined.  She had full  extension, flexion to 125 She was in slight valgus malalignment.  Elevated,  prepped with Duraprep, and draped in a sterile fashion.  Exsanguinated with  an Esmarch, tourniquet inflated to 350 mmHg.  A straight midline incision  made through the skin and subcutaneous tissue.  Small veins were thoroughly  coagulated.  A medial parapatellar arthrotomy was performed, the patella was  everted, and the knee was flexed.  There was a very minimal  medial soft  tissue release done.  She had end-stage osteoarthritic changes with bone-on-  bone contact.  Cruciate ligaments were resected.  A starter hole was made in  the distal femur.  The canal was irrigated and the effluent was clear.  The  intramedullary rod was gently placed.  We chose a 5 degree valgus cut, put  the rod at the femur, cut a 10 mm cut off the distal femur.  The distal  femur was found to be a size #7, and rotational marks were made.  The distal  femur was cut to fit a size #7, excess bone was removed, and osteophytes.  The tibial eminence was resected.  Medial and lateral menisci remnants were  removed, and geniculate vessels were coagulated.  The proximal tibia was  found to be a size #7, a starter hole was made, the canal was irrigated  until the effluent was clear, and the intramedullary rod was gently placed.  I chose a 0 degree slope.  Due to the fact that we had already resected both  cruciates, we were going to do a posterior cruciate-substituting implant.  We chose a 4 mm cut based upon the defect on the lateral side, giving a nice  resection off the proximal tibia.  Posteromedial, posterolateral femoral  osteophytes were removed under direct visualization, being very cautious  with the posterior neurovascular structures throughout the entire case.  The  femoral trochlea was prepared in a standard fashion.  At this point in time  a size 7 femur, size 7 tibia, with a 10 mm insert with excellent range of  motion and soft tissue balance and alignment and rotation and tracking.  Rotation marks were made in the proximal tibia.  The delta keel was  performed in a standard fashion.   The patella was found to be 22 mm in thickness.  It was reamed to a depth of  10 mm.  Locking holes were made.  The excess bone was removed.  At this time  the knee was copiously irrigated with pulsatile lavage while the cement was  mixed and cured on the back table.  Utilizing Modern  cement technique, all  components were cemented into place.  A size 7 tibia, size 7 femur, with a  26 patella.  With a 10 mm trial insert with excellent range of motion and  soft tissue balance with flexion-extension and patellar tracking was  anatomic, it did not require a lateral release.  The trial was removed,  excess cement was removed, bone wax placed to expose the bony surfaces, and  the tibial base plate was thoroughly dried and the final component, a 10 mm  thick posterior-stabilized flex insert, was placed.  We had excellent  flexion and extension.  Gaps were well-balanced.  Patellofemoral tracking  was anatomic.  Each layer was then irrigated with antibiotic solution during  the closure.  A meticulous hemostasis was performed on the way out.  Two  medium Hemovac drains were placed.  The arthrotomy closed with Vicryl  suture.  Subcu Vicryl.  Skin was closed with subcuticular Monocryl suture  after meticulous hemostasis.  Steri-Strips were applied.  In addition, on  the lateral drain site one more 4-0 Vicryl was placed there.  A sterile  pressure dressing applied.  The tourniquet was deflated.  She had a normal  pulse in the foot and ankle at the end of the case.  She was given another  gram of Ancef intravenously.  Ice packs were applied, a knee immobilizer in  full extension.  Again, another gram of Ancef had been given.  There were no  complications.  Sponge and needle count were correct.  She was gently  awakened, taken from the operating room to the PACU in stable condition.                                                Erasmo Leventhal, M.D.    RAC/MEDQ  D:  12/03/2002  T:  12/04/2002  Job:  [id number]

## 2010-09-07 NOTE — Discharge Summary (Signed)
NAME:  TYFFANI, FOGLESONG NO.:  [id number]   MEDICAL RECORD NO.:  [id number]                   PATIENT TYPE:  INP   LOCATION:  5016                                 FACILITY:  [facility]   PHYSICIAN:  Erasmo Leventhal, M.D.         DATE OF BIRTH:  1940/06/26   DATE OF ADMISSION:  12/03/2002  DATE OF DISCHARGE:  12/09/2002                                 DISCHARGE SUMMARY   ADMITTING DIAGNOSES:  End-stage osteoarthritis left knee.   DISCHARGE DIAGNOSES:  End-stage osteoarthritis left knee.   OPERATION:  Total knee replacement left knee.   BRIEF HISTORY:  This is a 70 year old female with a history of  osteoarthritis in both knees left greater than right.  She has had  conservative treatment with failure to keep her symptoms in tolerable range.  She has a history of superficial phlebitis, but no history of DVT to her  leg.  She has been cleared by her doctors, Di Kindle. Edilia Bo, M.D. and  Neta Mends. Panosh, M.D. [facility] for surgery and at this time is admitted for total  knee replacement surgery.  Risks, benefits, and after care were discussed  with the patient with surgery to go ahead as scheduled.   LABORATORIES:  Admission CBC within normal limits with exception of high  eosinophil at 6.  Admission PT/PTT within normal limits.  Admission CMET  within normal limits with exception of high glucose at 113.  Throughout her  hospitalization her BMET remained within normal limits with exception of the  elevated glucose.  PT/INR by discharge was 21.2 with an INR of 2.4.   HOSPITAL COURSE:  The patient tolerated the operative procedure well.  Internal medicine consult was obtained to follow her due to her cardiac  history and history of superficial DVT.  The patient was noted on her  preoperative chest x-ray to have a small abnormality and was recommended to  have a CT scan and medicine elected to follow that up on an outpatient basis  after discharge.  First  postoperative day patient was doing well.  Vital  signs were stable.  She was afebrile.  Drain was removed.  Second  postoperative day her swelling was decreased.  Sensation and circulation  were intact.  Hemoglobin was 11.5.  She was continued with PT and OT  therapy.  Third postoperative day she was feeling good.  She had minimal  pain.  Lungs were clear.  Bowel sounds were sluggish.  Hemoglobin was 10.2,  hematocrit 29.8.  BMET within normal limits with exception of elevated  glucose.  Dressing was changed.  Wound was healing well.  Plan was for  discharge on Wednesday pending stable medical condition.  Fourth  postoperative day patient continued to do well.  Vital signs were stable.  She was afebrile.  Her dressing was changed.  Her wound was benign.  She had  moderate calf tenderness with an equivocal  Homan's sign so Dopplers were  obtained.  The Dopplers did show negative for evidence of DVT.  Fifth  postoperative day she continued to do well.  She still had some calf  tenderness, but was decreased.  Her vital signs were stable.  She was  afebrile, moving her knee well and discharge was planned for the morning.  On postoperative day six feeling good with vital signs stable, afebrile,  wound benign, and neurovascular status intact in the leg.  She is discharged  home for follow-up in the office.   CONDITION ON DISCHARGE:  Improved.   DISCHARGE MEDICATIONS:  1. Percocet one to two q.6h. p.r.n. pain.  2. Robaxin one q.8h. p.r.n. spasm.  3. Trinsicon one t.i.d.  4. Coumadin per pharmacy protocol.   DISCHARGE INSTRUCTIONS:  She is instructed to use a home CPM, work with home  physical therapy, and return to the office in 10 days for reevaluation or  sooner p.r.n. problem.      Jaquelyn Bitter. Chabon, P.A.                   Erasmo Leventhal, M.D.    SJC/MEDQ  D:  02/14/2003  T:  02/14/2003  Job:  [id number]

## 2010-09-14 ENCOUNTER — Other Ambulatory Visit: Payer: Self-pay | Admitting: Internal Medicine

## 2010-10-06 IMAGING — US US RENAL
1 series · 14 of 22 positions shown · non-contrast
Comparison: CT 07/12/2009 and earlier studies

CLINICAL DATA: Renal failure

RENAL/URINARY TRACT ULTRASOUND COMPLETE

[Series 1: us renal · 0.32mm/px · 14 of 22 slices shown]
[im 1/22]
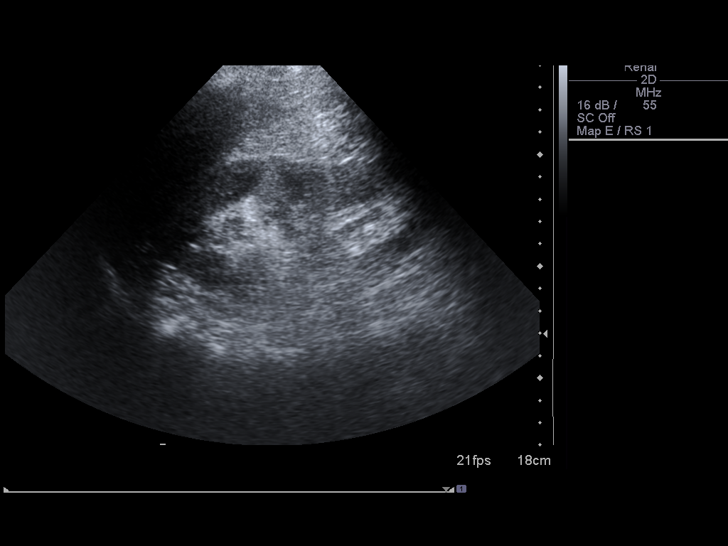
[im 3/22]
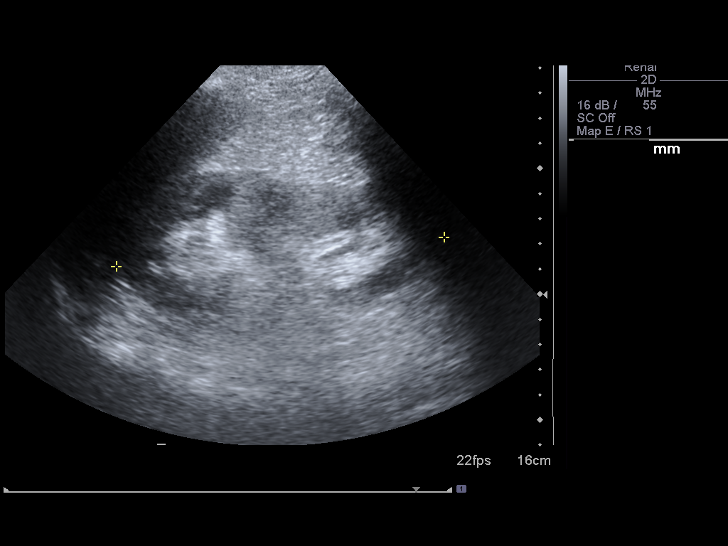
[im 4/22]
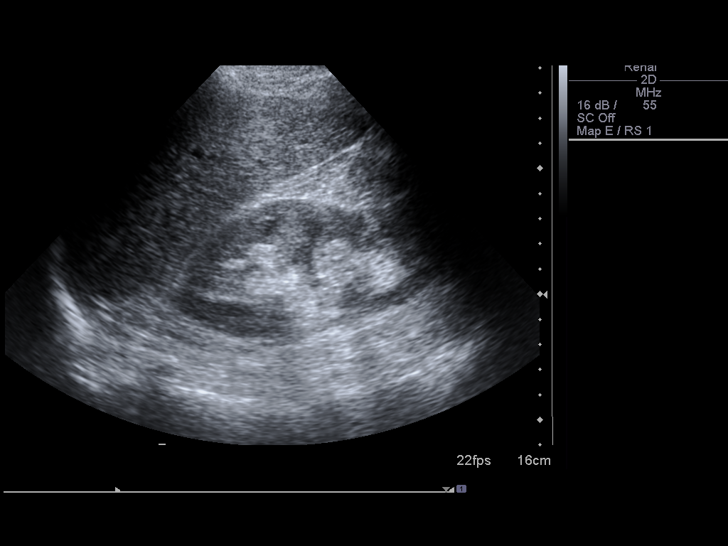
[im 6/22]
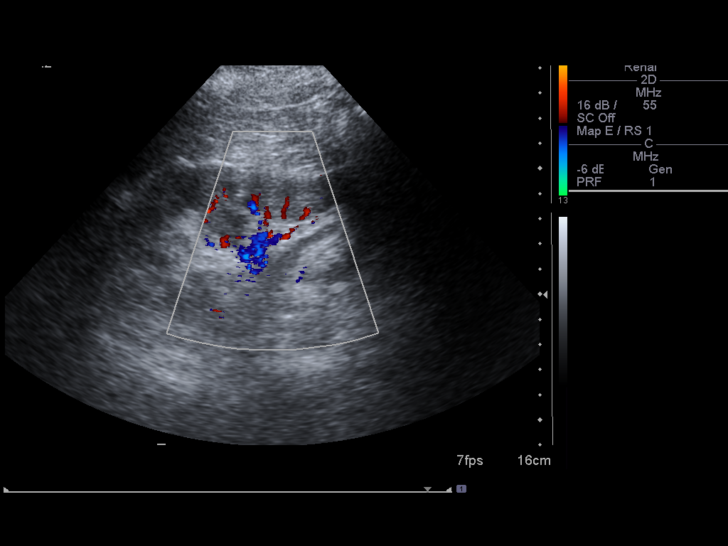
[im 8/22]
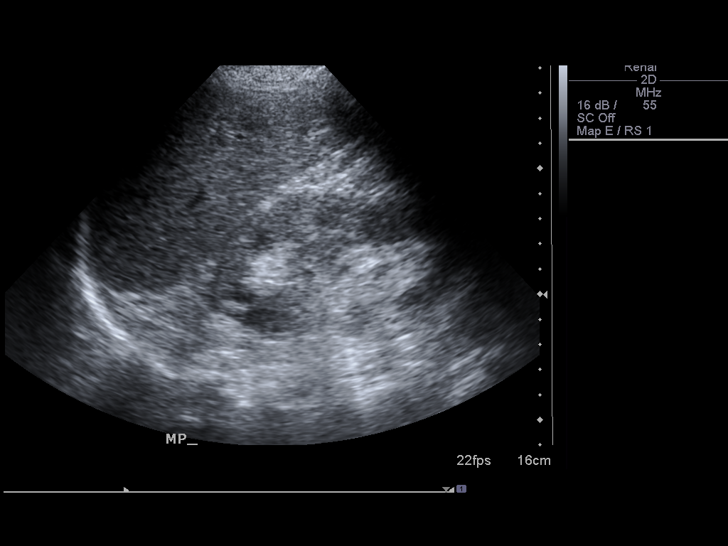
[im 9/22]
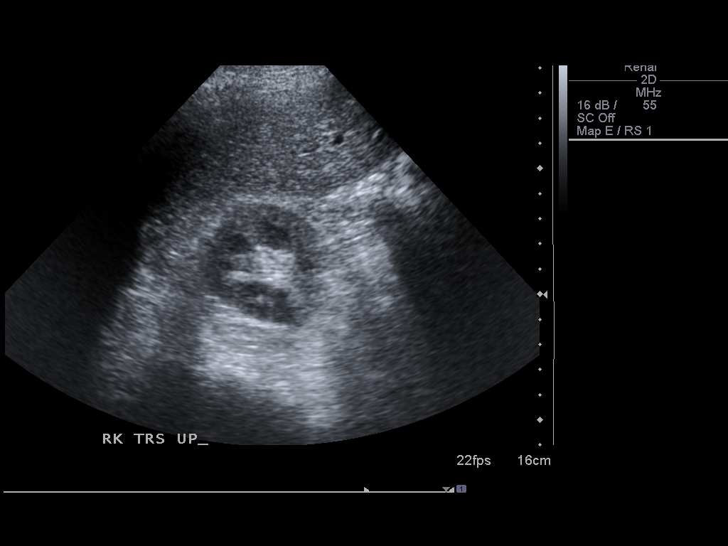
[im 11/22]
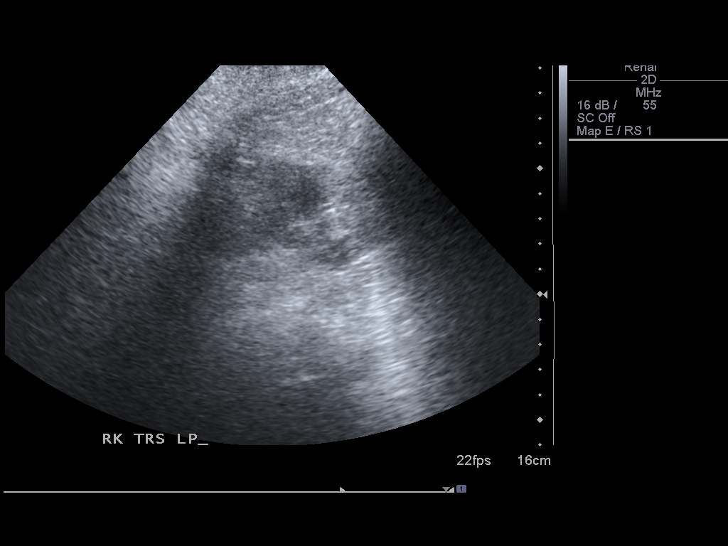
[im 12/22]
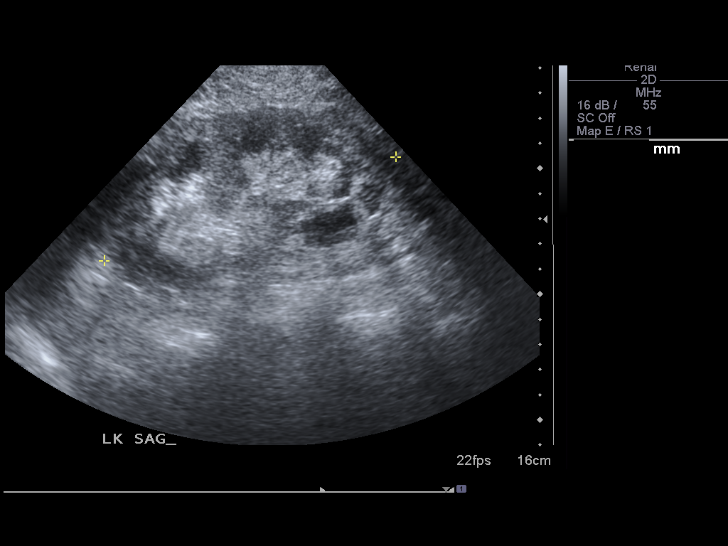
[im 14/22]
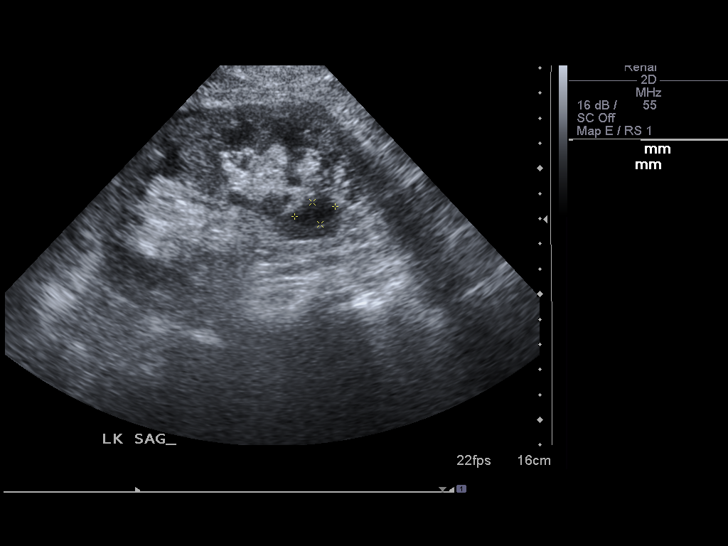
[im 15/22]
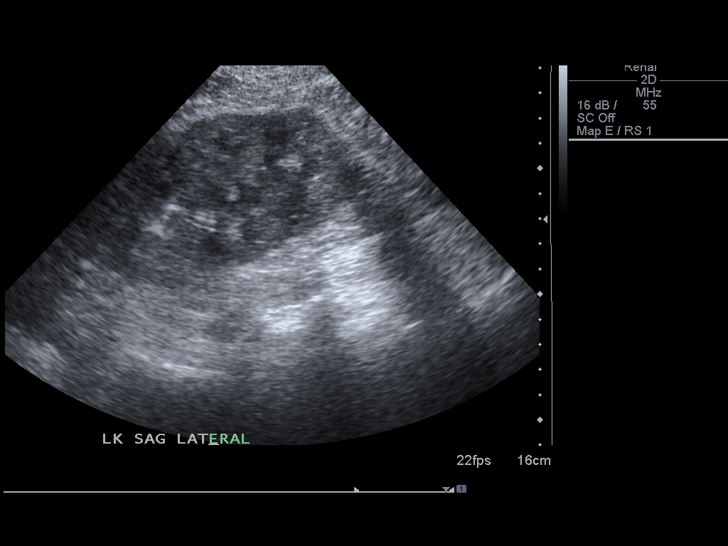
[im 17/22]
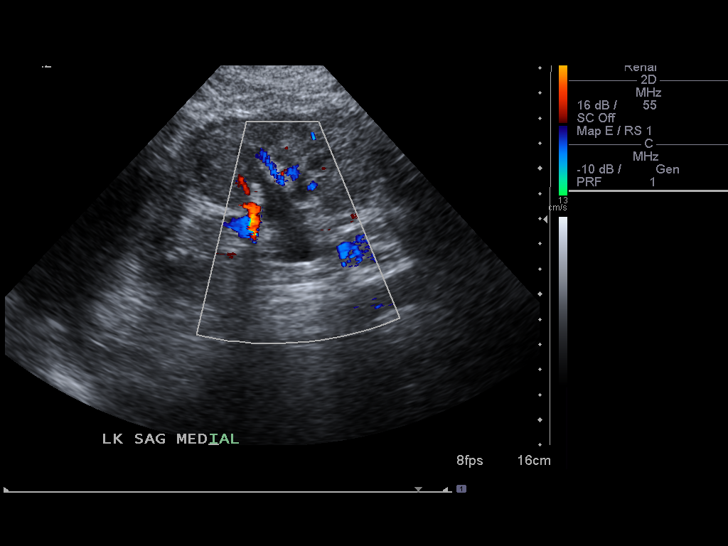
[im 19/22]
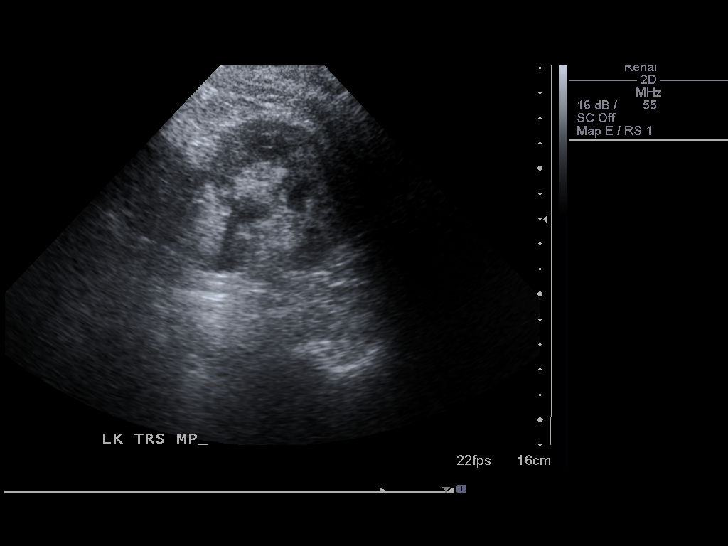
[im 20/22]
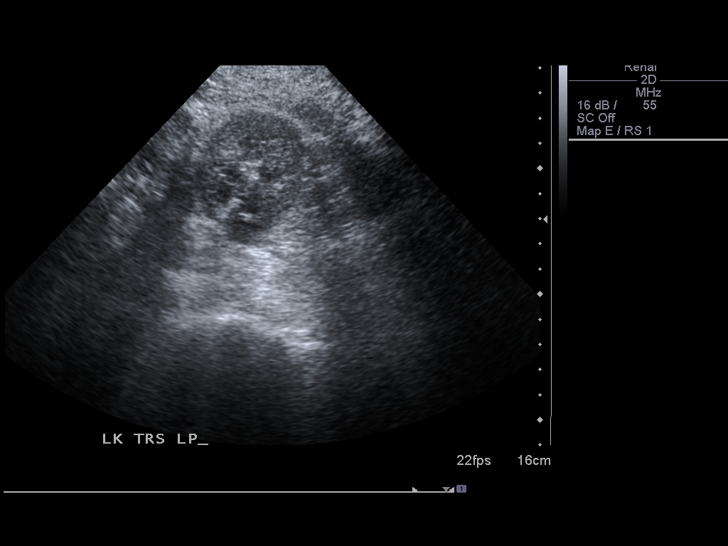
[im 22/22]
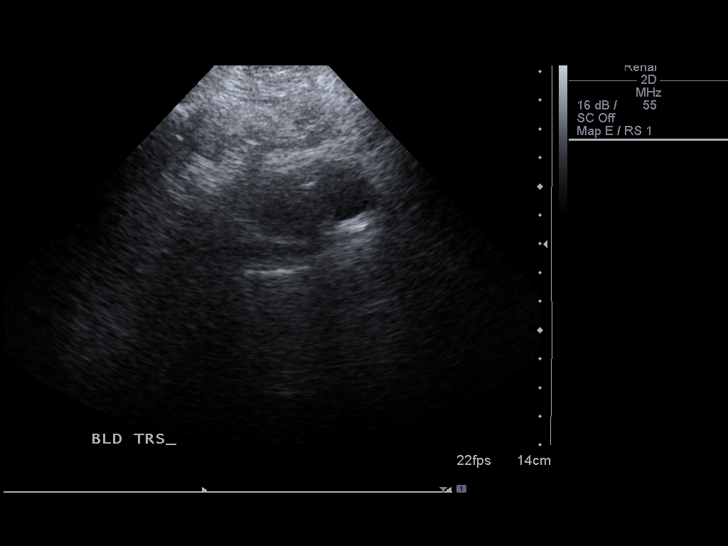

[14 of 22 positions shown; findings below may reference images not displayed]

FINDINGS: Right Kidney:  At least 13 cm in length.  No focal lesion or
hydronephrosis.

Left Kidney:  12.3 cm in length.  Mild pelvicaliectasis.  10 x 13 x
16 mm parapelvic cyst in the lower pole.

Bladder:  Decompressed by Foley catheter
IMPRESSION: 1.  Negative for hydronephrosis.
2.  Left parapelvic cyst

## 2010-10-10 ENCOUNTER — Other Ambulatory Visit: Payer: Self-pay | Admitting: Internal Medicine

## 2010-10-10 ENCOUNTER — Telehealth: Payer: Self-pay | Admitting: *Deleted

## 2010-10-10 NOTE — Telephone Encounter (Signed)
Refill on zolpidem. Last filled on 09/03/10 #30

## 2010-10-11 MED ORDER — ZOLPIDEM TARTRATE 10 MG PO TABS: 10.0000 mg | ORAL_TABLET | Freq: Every evening | ORAL | Status: DC | PRN

## 2010-10-11 NOTE — Telephone Encounter (Signed)
Per Dr. Fabian Sharp- ok x 1. Pt needs to schedule a follow up appt before next refill. Letter sent to pt and rx sent to pharmacy.

## 2010-10-19 ENCOUNTER — Other Ambulatory Visit: Payer: Self-pay | Admitting: Internal Medicine

## 2010-10-22 NOTE — Telephone Encounter (Signed)
Pt is due for fasting rov. Letter sent to pt.

## 2010-11-25 ENCOUNTER — Other Ambulatory Visit: Payer: Self-pay | Admitting: Internal Medicine

## 2010-12-21 ENCOUNTER — Other Ambulatory Visit: Payer: Self-pay | Admitting: Internal Medicine

## 2011-02-17 ENCOUNTER — Other Ambulatory Visit: Payer: Self-pay | Admitting: Internal Medicine

## 2011-02-25 ENCOUNTER — Telehealth: Payer: Self-pay | Admitting: *Deleted

## 2011-02-25 MED ORDER — AMLODIPINE BESYLATE 5 MG PO TABS: ORAL_TABLET | ORAL | Status: DC

## 2011-02-25 NOTE — Telephone Encounter (Signed)
Refill on norvasc

## 2011-02-26 ENCOUNTER — Other Ambulatory Visit: Payer: Self-pay | Admitting: Internal Medicine

## 2011-03-19 ENCOUNTER — Ambulatory Visit: Payer: Self-pay | Admitting: Internal Medicine

## 2011-03-19 ENCOUNTER — Encounter: Payer: Self-pay | Admitting: Internal Medicine

## 2011-03-22 ENCOUNTER — Other Ambulatory Visit: Payer: Self-pay | Admitting: Internal Medicine

## 2011-03-22 ENCOUNTER — Encounter: Payer: Self-pay | Admitting: Internal Medicine

## 2011-03-22 ENCOUNTER — Ambulatory Visit (INDEPENDENT_AMBULATORY_CARE_PROVIDER_SITE_OTHER): Payer: Medicare Other | Admitting: Internal Medicine

## 2011-03-22 VITALS — BP 120/80 | HR 72 | Wt 173.0 lb

## 2011-03-22 DIAGNOSIS — Z91120 Patient's intentional underdosing of medication regimen due to financial hardship: Secondary | ICD-10-CM

## 2011-03-22 DIAGNOSIS — F4321 Adjustment disorder with depressed mood: Secondary | ICD-10-CM

## 2011-03-22 DIAGNOSIS — I10 Essential (primary) hypertension: Secondary | ICD-10-CM

## 2011-03-22 DIAGNOSIS — Z23 Encounter for immunization: Secondary | ICD-10-CM

## 2011-03-22 DIAGNOSIS — M1990 Unspecified osteoarthritis, unspecified site: Secondary | ICD-10-CM

## 2011-03-22 DIAGNOSIS — K219 Gastro-esophageal reflux disease without esophagitis: Secondary | ICD-10-CM

## 2011-03-22 DIAGNOSIS — Y639 Failure in dosage during unspecified surgical and medical care: Secondary | ICD-10-CM

## 2011-03-22 DIAGNOSIS — G479 Sleep disorder, unspecified: Secondary | ICD-10-CM

## 2011-03-22 DIAGNOSIS — E785 Hyperlipidemia, unspecified: Secondary | ICD-10-CM

## 2011-03-22 DIAGNOSIS — K449 Diaphragmatic hernia without obstruction or gangrene: Secondary | ICD-10-CM

## 2011-03-22 DIAGNOSIS — E559 Vitamin D deficiency, unspecified: Secondary | ICD-10-CM

## 2011-03-22 LAB — POCT URINALYSIS DIPSTICK
Bilirubin, UA: NEGATIVE
pH, UA: 5

## 2011-03-22 MED ORDER — TRAMADOL HCL 50 MG PO TABS: 50.0000 mg | ORAL_TABLET | Freq: Four times a day (QID) | ORAL | Status: DC | PRN

## 2011-03-22 MED ORDER — LORAZEPAM 0.5 MG PO TABS: 0.5000 mg | ORAL_TABLET | Freq: Two times a day (BID) | ORAL | Status: DC | PRN

## 2011-03-22 MED ORDER — AMLODIPINE BESYLATE 5 MG PO TABS: 5.0000 mg | ORAL_TABLET | Freq: Every day | ORAL | Status: DC

## 2011-03-22 MED ORDER — ROSUVASTATIN CALCIUM 40 MG PO TABS: 40.0000 mg | ORAL_TABLET | Freq: Every day | ORAL | Status: DC

## 2011-03-22 MED ORDER — METOPROLOL TARTRATE 50 MG PO TABS: 25.0000 mg | ORAL_TABLET | Freq: Two times a day (BID) | ORAL | Status: DC

## 2011-03-22 MED ORDER — OMEPRAZOLE 20 MG PO CPDR: 20.0000 mg | DELAYED_RELEASE_CAPSULE | Freq: Every day | ORAL | Status: DC

## 2011-03-22 MED ORDER — ZOLPIDEM TARTRATE 10 MG PO TABS: 10.0000 mg | ORAL_TABLET | Freq: Every evening | ORAL | Status: DC | PRN

## 2011-03-22 NOTE — Progress Notes (Signed)
Subjective:    Patient ID: [name] [name], female    DOB: 12-13-1940, 70 y.o.   MRN: [id number]  HPI Patient comes in today for follow up of  multiple medical problems.  She has missed her follow ups . Financial issues  Donut whole etc. But had been doing ok  Cataract   With astygmatism and had many bills with this because insurance said it was cosmetic.  Cost   Of cymbalta to much in donut whole and stopped for months  Some pain.   Off for about 3 months  For cost reasons.  Increasing pain oa  Mood could be better but hard time of year with holidays and grief issues.  No cp sob has gerd.  Asks fo lorezepam if needd and ambien again for sleep taking 5 mg only when needs it. NO recent rxed.  Also needs something for pain  Knees  considering knee surgery next year  Review of Systems NO fever vision hearing  Bleeding some nausea and dec urin output but not like when she had renal failure.  Gets lower abd discomfort at times lateral Past Medical History  Diagnosis Date  . Anxiety   . GERD (gastroesophageal reflux disease)   . Hyperlipidemia   . Hypertension   . Arthritis   . Chronic kidney disease     stones  . Osteopenia     History   Social History  . Marital Status: Widowed    Spouse Name: N/A    Number of Children: N/A  . Years of Education: N/A   Occupational History  . Not on file.   Social History Main Topics  . Smoking status: Never Smoker   . Smokeless tobacco: Not on file  . Alcohol Use: No  . Drug Use: Not on file  . Sexually Active: Not on file   Other Topics Concern  . 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 Hernia repair   . Cholecystectomy   . Total knee arthroplasty   . Total right knee replacement   . Benign cyst  2001  . Bladder tumor excision 2001  . Ct head limited w/cm 08/03    neg  . Rt shoulder tendon tear 11/2005  .  Surgery for slipped disk     Family History  Problem Relation Age of Onset  . Hyperlipidemia    . Lung cancer Father     tobacco  . Kidney cancer Son   . Hypertension Brother     mom's side  . Alzheimer's disease Mother   . Osteoporosis Sister     Allergies  Allergen Reactions  . Meloxicam     REACTION: acute renal failure  with hypovolemic trigger Hospital  3 2011  . Sulfamethoxazole     REACTION: unspecified    Current Outpatient Prescriptions on File Prior to Visit  Medication Sig Dispense Refill  . ketoconazole (NIZORAL) 2 % cream Apply 1 application topically daily.          BP 120/80  Pulse 72  Wt 173 lb (78.472 kg)       Objective:   Physical Exam  WDWN in nad HEENT: Normocephalic ;atraumatic , Eyes;  PERRL, EOMs  Full, lids and conjunctiva clear,,Ears: no deformities, canals nl, TM landmarks normal, Nose: no deformity or discharge  Mouth : OP clear without lesion or edema . Neck: Supple  without adenopathy or masses or bruits Chest:  Clear to A&P without wheezes rales or rhonchi CV:  S1-S2 no gallops or murmurs peripheral perfusion is normal Abdomen:  Sof,t normal bowel sounds without hepatosplenomegaly, no guarding rebound or masses no CVA tenderness Oriented x 3. Normal cognition, attention, speech. Not anxious  appearing   Good eye contact . mildy subdued.      Assessment & Plan:  Mood  Does better on  cymbalta   Also helps her FM and oa.   Holidays    Counseled.  OA pain   As above care with tramadol for now hopefully cymbalta will help again LIPIDS refill meds and get lab monitoring SLeep  Problematic recently  Risk benefit of medication discussed.   Expectant management and cautions with meds lorax ambien and tramadol  Restarting the cymbalta hopefully will reduce need for these meds. HT :  Stable  Hx of renal insufficincy on nsaid  Delayed follow up cause of finances. Due for labs today and then plan fu .   Total visit 45 mins > 50% spent  counseling and coordinating care

## 2011-03-22 NOTE — Patient Instructions (Signed)
Can restart the cymbalta  Will notify you  of labs when available. Then plan follow up.  Will refill you meds today .

## 2011-03-24 ENCOUNTER — Encounter: Payer: Self-pay | Admitting: Internal Medicine

## 2011-03-24 DIAGNOSIS — Z91120 Patient's intentional underdosing of medication regimen due to financial hardship: Secondary | ICD-10-CM | POA: Insufficient documentation

## 2011-03-24 DIAGNOSIS — G479 Sleep disorder, unspecified: Secondary | ICD-10-CM | POA: Insufficient documentation

## 2011-03-24 MED ORDER — DULOXETINE HCL 60 MG PO CPEP: ORAL_CAPSULE | ORAL | Status: DC

## 2011-03-24 NOTE — Assessment & Plan Note (Signed)
Has been on high dose and not fu  Plan to change to OTC and check levele today

## 2011-04-02 ENCOUNTER — Ambulatory Visit (INDEPENDENT_AMBULATORY_CARE_PROVIDER_SITE_OTHER): Payer: Medicare Other | Admitting: Family Medicine

## 2011-04-02 ENCOUNTER — Encounter: Payer: Self-pay | Admitting: Family Medicine

## 2011-04-02 VITALS — BP 112/80 | HR 113 | Temp 98.4°F | Wt 171.0 lb

## 2011-04-02 DIAGNOSIS — J069 Acute upper respiratory infection, unspecified: Secondary | ICD-10-CM

## 2011-04-02 DIAGNOSIS — J029 Acute pharyngitis, unspecified: Secondary | ICD-10-CM

## 2011-04-02 LAB — CBC WITH DIFFERENTIAL/PLATELET
Basophils Absolute: 0 10*3/uL (ref 0.0–0.1)
HCT: 42.3 % (ref 36.0–46.0)
Hemoglobin: 14.1 g/dL (ref 12.0–15.0)
Lymphs Abs: 1.1 10*3/uL (ref 0.7–4.0)
MCHC: 33.5 g/dL (ref 30.0–36.0)
MCV: 89.9 fl (ref 78.0–100.0)
Monocytes Relative: 11.1 % (ref 3.0–12.0)
Neutro Abs: 2.1 10*3/uL (ref 1.4–7.7)
RDW: 14.2 % (ref 11.5–14.6)

## 2011-04-02 MED ORDER — CETIRIZINE HCL 10 MG PO TABS: 10.0000 mg | ORAL_TABLET | Freq: Every day | ORAL | Status: DC

## 2011-04-02 NOTE — Progress Notes (Signed)
  Subjective:     [name] [name] is a 70 y.o. female who presents for evaluation of sore throat. Associated symptoms include post nasal drip, sinus and nasal congestion and sore throat. Onset of symptoms was 3 days ago, and have been unchanged since that time. She is not drinking much. She has not had a recent close exposure to someone with proven streptococcal pharyngitis.  The following portions of the patient's history were reviewed and updated as appropriate: allergies, current medications, past family history, past medical history, past social history, past surgical history and problem list.  Review of Systems Pertinent items are noted in HPI.    Objective:    Head: Normocephalic, without obvious abnormality, atraumatic Eyes: conjunctivae/corneas clear. PERRL, EOM's intact. Fundi benign. Ears: normal TM's and external ear canals both ears Nose: Nares normal. Septum midline. Mucosa normal. No drainage or sinus tenderness. Throat: abnormal findings: mild oropharyngeal erythema Neck: no adenopathy, no carotid bruit, no JVD, supple, symmetrical, trachea midline and thyroid not enlarged, symmetric, no tenderness/mass/nodules Lungs: clear to auscultation bilaterally Heart: regular rate and rhythm, S1, S2 normal, no murmur, click, rub or gallop  Laboratory Strep test not done.   Assessment:    Acute pharyngitis, likely  Viral pharyngitis.    Plan:    Use of OTC analgesics recommended as well as salt water gargles. Follow up as needed.  Cetirizine 10mg  PO x 1 #30 no refills

## 2011-04-02 NOTE — Patient Instructions (Signed)

## 2011-04-03 ENCOUNTER — Telehealth: Payer: Self-pay | Admitting: Internal Medicine

## 2011-04-03 LAB — LIPID PANEL
Cholesterol: 148 mg/dL (ref 0–200)
VLDL: 13.6 mg/dL (ref 0.0–40.0)

## 2011-04-03 LAB — BASIC METABOLIC PANEL
CO2: 27 mEq/L (ref 19–32)
Chloride: 107 mEq/L (ref 96–112)
GFR: 60.31 mL/min (ref >60.00)
Glucose, Bld: 93 mg/dL (ref 70–99)
Potassium: 3.9 mEq/L (ref 3.5–5.1)
Sodium: 140 mEq/L (ref 135–145)

## 2011-04-03 LAB — HEPATIC FUNCTION PANEL
AST: 26 U/L (ref 0–37)
Albumin: 4 g/dL (ref 3.5–5.2)

## 2011-04-03 NOTE — Telephone Encounter (Signed)
Spoke to pt and answered all questions

## 2011-04-03 NOTE — Telephone Encounter (Signed)
Pt called and said that she was in to see Dr Caryl Never yesterday and was given a med. Pt is req a call back from Dr Fabian Sharp or nurse, to discuss her ov from yesterday.

## 2011-04-04 ENCOUNTER — Emergency Department (HOSPITAL_COMMUNITY)
Admission: EM | Admit: 2011-04-04 | Discharge: 2011-04-04 | Disposition: A | Discharge status: Home / Self Care | Payer: Medicare Other | Attending: Emergency Medicine | Admitting: Emergency Medicine

## 2011-04-04 ENCOUNTER — Encounter: Payer: Self-pay | Admitting: *Deleted

## 2011-04-04 ENCOUNTER — Encounter (HOSPITAL_COMMUNITY): Payer: Self-pay | Admitting: *Deleted

## 2011-04-04 DIAGNOSIS — I10 Essential (primary) hypertension: Secondary | ICD-10-CM | POA: Insufficient documentation

## 2011-04-04 DIAGNOSIS — H1130 Conjunctival hemorrhage, unspecified eye: Secondary | ICD-10-CM | POA: Insufficient documentation

## 2011-04-04 DIAGNOSIS — Z79899 Other long term (current) drug therapy: Secondary | ICD-10-CM | POA: Insufficient documentation

## 2011-04-04 DIAGNOSIS — E785 Hyperlipidemia, unspecified: Secondary | ICD-10-CM | POA: Insufficient documentation

## 2011-04-04 MED ORDER — CYCLOPENTOLATE HCL 1 % OP SOLN
2.0000 [drp] | Freq: Once | OPHTHALMIC | Status: AC
  Administered 2011-04-04: 2 [drp] via OPHTHALMIC
  Filled 2011-04-04: qty 2

## 2011-04-04 NOTE — Progress Notes (Signed)
Quick Note:    Letter sent to pt.  ______

## 2011-04-04 NOTE — ED Provider Notes (Signed)
History     CSN: [id number] Arrival date & time: 04/04/2011  1:56 AM   First MD Initiated Contact with Patient 04/04/11 0247      Chief Complaint  Patient presents with  . Eye Problem    (Consider location/radiation/quality/duration/timing/severity/associated sxs/prior treatment) HPI This is a 70 year old white female who had the sudden onset of right eye pain about 2 and half hours ago. It was moderate to severe and lasted about 10 minutes and resolve spontaneously. She now has a mild sense of foreign body in her right eye but none of the pain she had earlier. She states her vision is not changed in that eye. She looked in the mirror and noted that she has bled on the surface of her right eye. This concerned her and she came in for evaluation. She has a history of bilateral lens implants for cataracts. There are no known exacerbating or mitigating factors. Specifically she has had occasional cough but no severe fits that might be expected to cause a spontaneous hemorrhage.  Past Medical History  Diagnosis Date  . Anxiety   . GERD (gastroesophageal reflux disease)   . Hyperlipidemia   . Hypertension   . Arthritis   . Chronic kidney disease     stones  . Osteopenia     Past Surgical History  Procedure Date  . Hernia repair   . Cholecystectomy   . Total knee arthroplasty   . Total right knee replacement   . Benign cyst  2001  . Bladder tumor excision 2001  . Ct head limited w/cm 08/03    neg  . Rt shoulder tendon tear 11/2005  . Surgery for slipped disk     Family History  Problem Relation Age of Onset  . Hyperlipidemia    . Lung cancer Father     tobacco  . Kidney cancer Son   . Hypertension Brother     mom's side  . Alzheimer's disease Mother   . Osteoporosis Sister     History  Substance Use Topics  . Smoking status: Never Smoker   . Smokeless tobacco: Not on file  . Alcohol Use: No    OB History    Grav Para Term Preterm Abortions TAB SAB Ect Mult  Living                  Review of Systems  All other systems reviewed and are negative.    Allergies  Meloxicam and Sulfamethoxazole  Home Medications   Current Outpatient Rx  Name Route Sig Dispense Refill  . AMLODIPINE BESYLATE 5 MG PO TABS Oral Take 1 tablet (5 mg total) by mouth daily. 1 daily. 30 tablet 5  . CETIRIZINE HCL 10 MG PO TABS Oral Take 1 tablet (10 mg total) by mouth daily. 30 tablet 1  . DULOXETINE HCL 60 MG PO CPEP  2 30 mg per day 20 capsule 0  . KETOCONAZOLE 2 % EX CREA Topical Apply 1 application topically daily.      Marland Kitchen LORAZEPAM 0.5 MG PO TABS Oral Take 1 tablet (0.5 mg total) by mouth 2 (two) times daily as needed. 30 tablet 1  . METOPROLOL TARTRATE 50 MG PO TABS Oral Take 0.5 tablets (25 mg total) by mouth 2 (two) times daily. 30 tablet 5  . OMEPRAZOLE 20 MG PO CPDR  TAKE 1 CAPSULE TWICE A DAY OR AS DIRECTED 60 capsule 5  . ROSUVASTATIN CALCIUM 40 MG PO TABS Oral Take 1 tablet (40 mg total)  by mouth daily. 1 daily 30 tablet 5  . TRAMADOL HCL 50 MG PO TABS Oral Take 1 tablet (50 mg total) by mouth every 6 (six) hours as needed. 60 tablet 1  . ZOLPIDEM TARTRATE 10 MG PO TABS Oral Take 1 tablet (10 mg total) by mouth at bedtime as needed. 30 tablet 3  . OMEPRAZOLE 20 MG PO CPDR Oral Take 1 capsule (20 mg total) by mouth daily. 60 capsule 5    BP 129/98  Pulse 71  Temp(Src) 98.3 F (36.8 C) (Oral)  Resp 18  SpO2 97%  Physical Exam General: Well-developed, well-nourished female in no acute distress; appearance consistent with age of record HENT: normocephalic, atraumatic Eyes: pupils equal round and reactive to light; extraocular muscles intact; no hyphema; right subconjunctival hemorrhage; funduscopic exam without dilation normal; globes soft and nontender Neck: supple Heart: regular rate and rhythm Lungs: Normal respiratory effort and excursion Abdomen: soft; nondistended Extremities: No deformity; full range of motion Neurologic: Awake, alert and  oriented; motor function intact in all extremities and symmetric; no facial droop Skin: Warm and dry Psychiatric: Normal mood and affect    ED Course  Procedures (including critical care time)   MDM  4:00 AM Normal funduscopic exam after dilation with cyclopentolate. Advised patient to followup with Dr. Dione Booze in the morning.       Hanley Seamen, MD 04/04/11 [phone]

## 2011-04-04 NOTE — ED Notes (Addendum)
Pt in c/o episode of pain in right eye and then noted blood in eye, c/o headache at this time since eye problem started

## 2011-04-13 ENCOUNTER — Other Ambulatory Visit: Payer: Self-pay | Admitting: Internal Medicine

## 2011-05-31 ENCOUNTER — Telehealth: Payer: Self-pay | Admitting: *Deleted

## 2011-05-31 NOTE — Telephone Encounter (Signed)
Patient is calling because she has not been feeling well for about 2 months.  She has no energy and abdominal cramps after eating.  The past week she has had a cough with heaviness in chest, SOB with activities with no fever.  She does have a family history of heart disease.  Per Dr Fabian Sharp Saturday clinic or ER.  Patient going to the ER.

## 2011-06-01 ENCOUNTER — Ambulatory Visit (INDEPENDENT_AMBULATORY_CARE_PROVIDER_SITE_OTHER): Payer: Medicare Other | Admitting: Family Medicine

## 2011-06-01 ENCOUNTER — Encounter: Payer: Self-pay | Admitting: Family Medicine

## 2011-06-01 VITALS — BP 122/64 | HR 66 | Temp 97.8°F | Wt 170.0 lb

## 2011-06-01 DIAGNOSIS — R5383 Other fatigue: Secondary | ICD-10-CM | POA: Insufficient documentation

## 2011-06-01 DIAGNOSIS — E559 Vitamin D deficiency, unspecified: Secondary | ICD-10-CM

## 2011-06-01 DIAGNOSIS — R5381 Other malaise: Secondary | ICD-10-CM

## 2011-06-01 DIAGNOSIS — R0789 Other chest pain: Secondary | ICD-10-CM | POA: Insufficient documentation

## 2011-06-01 NOTE — Assessment & Plan Note (Signed)
Ordered D level with next labs

## 2011-06-01 NOTE — Patient Instructions (Signed)
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

## 2011-06-01 NOTE — Assessment & Plan Note (Addendum)
This is atypical/ non exertional and with some tenderness Costochondritis is possible - disc this as well as gastritis/ GERD - given hx and epigastric tenderness  Did give her samples of nexium to try instead of prilosec and update Checking B 12 in light of long term PPI use  EKG is re assuring rate of 64 with occas PAC and low voltage in limb leads (no acute changes from prev) Will order labs for fatigue and f/u with PCP

## 2011-06-01 NOTE — Assessment & Plan Note (Signed)
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

## 2011-06-01 NOTE — Progress Notes (Signed)
Subjective:    Patient ID: [name] [name], female    DOB: Jul 30, 1940, 71 y.o.   MRN: [id number]  HPI Here for general fatigue - has not felt good for months  Started with a virus early in December - and had an eye hemorrhage Told she had a uri - given zyrtec  Symptoms got a little better and then got worse-- chest was sore when she coughed  Did not go back to doctor Now has occ mild cough and runny nose -- white phlegm  No fever  No energy  Also some epigastric pain after she eats   Had EGD several years ago with dilatation   No desire to do anything - very tired , generally weak   (per chart has hx of depression and somatization and oa and fibromyalgia Almost unsteady No urinary symptoms at all  A little pressure in chest - no sob or nausea/ not exertional   Has seen cardiologist- Dr Antoine Poche  Nl cardiac work up  Had a stress test   Patient Active Problem List  Diagnoses  . TINEA CORPORIS  . VITAMIN D DEFICIENCY  . HYPERLIPIDEMIA  . IRON DEFICIENCY  . ANEMIA-B12 DEFICIENCY  . ANEMIA  . ANXIETY  . SOMATIZATION DISORDER  . Adjustment Disorder with Depressed Mood  . ADJ DISORDER WITH MIXED ANXIETY & DEPRESSED MOOD  . DETACHED RETINA  . HYPERTENSION  . VARICOSE VEINS, LOWER EXTREMITIES  . GERD  . HIATAL HERNIA  . IBS  . ACUTE KIDNEY FAILURE UNSPECIFIED  . LUMP OR MASS IN BREAST  . OSTEOARTHRITIS  . FIBROMYALGIA  . OSTEOPENIA  . CHEST PAIN  . DYSPHAGIA UNSPECIFIED  . ABDOMINAL BLOATING  . ABDOMINAL PAIN OTHER SPECIFIED SITE  . Personal history of other diseases of digestive disease  . RENAL CALCULUS, HX OF  . Itching  . Sleep difficulties  . Intentional underdosing of medication regimen by patient due to financial hardship   Past Medical History  Diagnosis Date  . Anxiety   . GERD (gastroesophageal reflux disease)   . Hyperlipidemia   . Hypertension   . Arthritis   . Chronic kidney disease     stones  . Osteopenia    Past Surgical History  Procedure  Date  . Hernia repair   . Cholecystectomy   . Total knee arthroplasty   . Total right knee replacement   . Benign cyst  2001  . Bladder tumor excision 2001  . Ct head limited w/cm 08/03    neg  . Rt shoulder tendon tear 11/2005  . Surgery for slipped disk    History  Substance Use Topics  . Smoking status: Never Smoker   . Smokeless tobacco: Not on file  . Alcohol Use: No   Family History  Problem Relation Age of Onset  . Hyperlipidemia    . Lung cancer Father     tobacco  . Kidney cancer Son   . Hypertension Brother     mom's side  . Alzheimer's disease Mother   . Osteoporosis Sister    Allergies  Allergen Reactions  . Meloxicam     REACTION: acute renal failure  with hypovolemic trigger [facility]  3 2011  . Sulfamethoxazole     REACTION: unspecified   Current Outpatient Prescriptions on File Prior to Visit  Medication Sig Dispense Refill  . amLODipine (NORVASC) 5 MG tablet Take 1 tablet (5 mg total) by mouth daily. 1 daily.  30 tablet  5  . cetirizine (ZYRTEC) 10  MG tablet Take 1 tablet (10 mg total) by mouth daily.  30 tablet  1  . CRESTOR 40 MG tablet TAKE 1 TABLET EVERY DAY  15 tablet  0  . DULoxetine (CYMBALTA) 60 MG capsule 2 30 mg per day  20 capsule  0  . ketoconazole (NIZORAL) 2 % cream Apply 1 application topically daily.        Marland Kitchen LORazepam (ATIVAN) 0.5 MG tablet Take 1 tablet (0.5 mg total) by mouth 2 (two) times daily as needed.  30 tablet  1  . metoprolol (LOPRESSOR) 50 MG tablet Take 0.5 tablets (25 mg total) by mouth 2 (two) times daily.  30 tablet  5  . omeprazole (PRILOSEC) 20 MG capsule Take 1 capsule (20 mg total) by mouth daily.  60 capsule  5  . omeprazole (PRILOSEC) 20 MG capsule TAKE 1 CAPSULE TWICE A DAY OR AS DIRECTED  60 capsule  5  . rosuvastatin (CRESTOR) 40 MG tablet Take 1 tablet (40 mg total) by mouth daily. 1 daily  30 tablet  5  . traMADol (ULTRAM) 50 MG tablet Take 1 tablet (50 mg total) by mouth every 6 (six) hours as needed.  60  tablet  1  . zolpidem (AMBIEN) 10 MG tablet Take 1 tablet (10 mg total) by mouth at bedtime as needed.  30 tablet  3        Review of Systems Review of Systems  Constitutional: Negative for fever, appetite change, and unexpected weight change. pos for general fatigue and malaise Eyes: Negative for pain and visual disturbance.  ENT pos for rhinorrhea/ clear, neg for st or sinus pain  Respiratory: Negative for cough and shortness of breath.   Cardiovascular: Negative for cp or palpitations, pos for chest pressure and tenderness, neg for edema or PND or orthopnea  Gastrointestinal: Negative for nausea, diarrhea and constipation.  Genitourinary: Negative for urgency and frequency.  Skin: Negative for pallor or rash   Neurological: Negative for weakness, light-headedness, numbness and headaches.  Hematological: Negative for adenopathy. Does not bruise/bleed easily.  Psychiatric/Behavioral: pos for depression/ anxiety and severe stress (no SI)        Objective:   Physical Exam  Constitutional: She appears well-developed and well-nourished. No distress.       Obese and well appearing  Somewhat anxious  HENT:  Head: Normocephalic and atraumatic.  Left Ear: External ear normal.  Mouth/Throat: Oropharynx is clear and moist.  Eyes: Conjunctivae and EOM are normal. Pupils are equal, round, and reactive to light. Right eye exhibits no discharge. Left eye exhibits no discharge.  Neck: Normal range of motion. Neck supple. No JVD present. Carotid bruit is not present. No thyromegaly present.  Cardiovascular: Normal rate, regular rhythm, normal heart sounds and intact distal pulses.  Exam reveals no gallop.   No murmur heard. Pulmonary/Chest: Effort normal and breath sounds normal. No respiratory distress. She has no wheezes. She has no rales. She exhibits tenderness.       Chest is tender - sternal  No crepitus or skin changes  Abdominal: Soft. Bowel sounds are normal. She exhibits no  distension and no mass. There is tenderness. There is no rebound and no guarding.       Very mild epigastric tenderness   Musculoskeletal: She exhibits no edema.  Lymphadenopathy:    She has no cervical adenopathy.  Neurological: She is alert. She has normal reflexes. No cranial nerve deficit. She exhibits normal muscle tone. Coordination normal.  Skin: Skin is warm  and dry. No rash noted. No erythema. No pallor.  Psychiatric: Her behavior is normal. Judgment and thought content normal. Her mood appears anxious. Her affect is not labile and not inappropriate. Her speech is rapid and/or pressured and tangential. Cognition and memory are normal. She exhibits a depressed mood.       Anxious with rapid speech  Good eye contact and comm skills            Assessment & Plan:

## 2011-06-05 ENCOUNTER — Other Ambulatory Visit (INDEPENDENT_AMBULATORY_CARE_PROVIDER_SITE_OTHER): Payer: Medicare Other

## 2011-06-05 DIAGNOSIS — R5383 Other fatigue: Secondary | ICD-10-CM

## 2011-06-05 DIAGNOSIS — R5381 Other malaise: Secondary | ICD-10-CM

## 2011-06-05 DIAGNOSIS — Z79899 Other long term (current) drug therapy: Secondary | ICD-10-CM

## 2011-06-05 LAB — CBC WITH DIFFERENTIAL/PLATELET
Basophils Relative: 0.6 % (ref 0.0–3.0)
Eosinophils Absolute: 0.1 10*3/uL (ref 0.0–0.7)
Eosinophils Relative: 1.4 % (ref 0.0–5.0)
Hemoglobin: 13.3 g/dL (ref 12.0–15.0)
Lymphocytes Relative: 11.6 % — ABNORMAL LOW (ref 12.0–46.0)
MCHC: 33.3 g/dL (ref 30.0–36.0)
Monocytes Relative: 7.7 % (ref 3.0–12.0)
Neutro Abs: 5.8 10*3/uL (ref 1.4–7.7)
Neutrophils Relative %: 78.7 % — ABNORMAL HIGH (ref 43.0–77.0)
RBC: 4.53 Mil/uL (ref 3.87–5.11)
WBC: 7.4 10*3/uL (ref 4.5–10.5)

## 2011-06-05 LAB — VITAMIN B12: Vitamin B-12: 240 pg/mL (ref 211–911)

## 2011-06-05 LAB — TSH: TSH: 1.55 u[IU]/mL (ref 0.35–5.50)

## 2011-06-05 LAB — COMPREHENSIVE METABOLIC PANEL
AST: 17 U/L (ref 0–37)
Albumin: 3.8 g/dL (ref 3.5–5.2)
BUN: 16 mg/dL (ref 6–23)
CO2: 26 mEq/L (ref 19–32)
Calcium: 9.7 mg/dL (ref 8.4–10.5)
Chloride: 106 mEq/L (ref 96–112)
GFR: 64.89 mL/min (ref >60.00)
Potassium: 4.5 mEq/L (ref 3.5–5.1)

## 2011-07-18 ENCOUNTER — Ambulatory Visit (INDEPENDENT_AMBULATORY_CARE_PROVIDER_SITE_OTHER): Payer: Medicare Other | Admitting: Internal Medicine

## 2011-07-18 ENCOUNTER — Encounter: Payer: Self-pay | Admitting: Internal Medicine

## 2011-07-18 VITALS — BP 120/70 | HR 62 | Temp 98.4°F | Wt 163.5 lb

## 2011-07-18 DIAGNOSIS — G479 Sleep disorder, unspecified: Secondary | ICD-10-CM

## 2011-07-18 DIAGNOSIS — R5381 Other malaise: Secondary | ICD-10-CM

## 2011-07-18 DIAGNOSIS — R0789 Other chest pain: Secondary | ICD-10-CM

## 2011-07-18 DIAGNOSIS — F4323 Adjustment disorder with mixed anxiety and depressed mood: Secondary | ICD-10-CM

## 2011-07-18 DIAGNOSIS — N62 Hypertrophy of breast: Secondary | ICD-10-CM

## 2011-07-18 DIAGNOSIS — R5383 Other fatigue: Secondary | ICD-10-CM

## 2011-07-18 DIAGNOSIS — M1990 Unspecified osteoarthritis, unspecified site: Secondary | ICD-10-CM

## 2011-07-18 DIAGNOSIS — I10 Essential (primary) hypertension: Secondary | ICD-10-CM

## 2011-07-18 DIAGNOSIS — K219 Gastro-esophageal reflux disease without esophagitis: Secondary | ICD-10-CM

## 2011-07-18 DIAGNOSIS — R42 Dizziness and giddiness: Secondary | ICD-10-CM

## 2011-07-18 DIAGNOSIS — E785 Hyperlipidemia, unspecified: Secondary | ICD-10-CM

## 2011-07-18 DIAGNOSIS — J029 Acute pharyngitis, unspecified: Secondary | ICD-10-CM | POA: Insufficient documentation

## 2011-07-18 MED ORDER — PANTOPRAZOLE SODIUM 40 MG PO TBEC: 40.0000 mg | DELAYED_RELEASE_TABLET | Freq: Every day | ORAL | Status: DC

## 2011-07-18 MED ORDER — DULOXETINE HCL 60 MG PO CPEP: 60.0000 mg | ORAL_CAPSULE | Freq: Every day | ORAL | Status: DC

## 2011-07-18 NOTE — Patient Instructions (Signed)
I think a lot of your symptoms could be related to esophagus and reflux . Concern about getting a narrowing.    I would like you to see  GI  About this  . This could be causing  Sore throat.   Check your blood pressure readings  At different times of day.   Our BP is better today.   We can restart the cymbalta when you want to do this.

## 2011-07-18 NOTE — Progress Notes (Signed)
  Subjective:    Patient ID: [name] [name], female    DOB: 1940-12-29, 71 y.o.   MRN: [id number]  HPI Patient comes in today for SDA for  new problem evaluation. But actually has  But has many issues  Las ov with me was in fall. Sore throat and  Drainage and chest still like cold for rmonths  Seen in Saturday clinic for atypical CP( has hx of same) EKG.   improve for 4 days.  Comes and goes  Head not right .  No focusing right  . But no true vertigo or focal weakness. No falling   Bp readings. At home  159 and 152   And 118Taken readings in am and not missing doses of metoprolol  And still on pm norvasc.    Depression  Worse .    Now on no meds.   Avoiding cymbalta for cost  .  Had worked.  Having depression.   Has some support. Wants to try restart    Remote hx of  HB and now  Omeprazole  Pre meal.  And 2 bid.   Was given nexium for 2 weeks and may have done better.  But not now.    Sleep  ambien at times  Takes 1/2 ultram for joint pain if needed  Review of Systems NO fever sob new cough gets lower pelvic soreness with walking for a while no hernia noted or noted groin pain .  No new rashes   Past history family history social history reviewed in the electronic medical record.     Objective:   Physical Exam wdwn in nad well groomed HEENT: Normocephalic ;atraumatic , Eyes;  PERRL, EOMs  Full, lids and conjunctiva clear,,Ears: no deformities, canals nl, TM landmarks normal, Nose: no deformity or discharge  Mouth : OP clear without lesion or edema .mild erythema  Neck: Supple without adenopathy or masses or bruits pendulous breasts Chest:  Clear to A&P without wheezes rales or rhonchi CV:  S1-S2 no gallops or murmurs peripheral perfusion is normal repeat BP Abdomen:  Sof,t normal bowel sounds without hepatosplenomegaly, no guarding rebound or masses no CVA tenderness EXT:  oa changes hands deformity  Hips slight dec rom no groin pain and gait stable  No clubbing cyanosis or  edema      Assessment & Plan:  Sore throat and drainage feeling going off and on for months  Poss elr vs other Atypical chest pain  Seems like esophageal causes  See last note Change ot generic protonix to see if affordable and helps  . Refer to her GI   Depression anxiety off meds for financial reasons  But thinks its time to go back on cymbalta  Samples available to day and to start 30 increase to 60   gynecomastia  Can contribute to  upper shoulder chest discomfort  FM OA sig pain cant take nsaid with hx of acute renal failure.  HT  Ok in office  Elevated at home  check readings and bring in machine to next visit   She will check into  [facility];  rxed printed.

## 2011-07-23 ENCOUNTER — Telehealth: Payer: Self-pay | Admitting: Gastroenterology

## 2011-07-23 MED ORDER — SUCRALFATE 1 GM/10ML PO SUSP: ORAL | Status: DC

## 2011-07-23 NOTE — Telephone Encounter (Signed)
Informed pt Dr Jarold Motto recommends she take Carafate suspension until her appt. Certainly if she has other problems or this doesn't work, she will call.

## 2011-07-23 NOTE — Telephone Encounter (Signed)
Give her 4 times a day liquid Carafate suspension. Appointment as scheduled seems good

## 2011-07-23 NOTE — Telephone Encounter (Signed)
Pt last seen in March of 2010; COLON showing diverticulosis and chronic IBS, EGD with dilation of stricture and Chronic GERD. Pt has seen Dr Fabian Sharp for GI problems and she suggested a referral to [place].  Pt reports her throat is always sore and it burns all the time; on Prilosec bid. She also reports pain her her side with the left side hurting more than r. She has an increase of mucus in her stools or sometimes, just mucus. Her appetite is decreased and she doesn't want anything to eat. Pt given 1st appt which is 08/12/11, unless you advise something else. Thanks.

## 2011-07-24 ENCOUNTER — Telehealth: Payer: Self-pay | Admitting: Gastroenterology

## 2011-07-24 NOTE — Telephone Encounter (Signed)
Should be ok as her last labs were normal .

## 2011-07-24 NOTE — Telephone Encounter (Signed)
Pt picked up the Carafate solution and it has a warning for elderly pts and for those with kidney problems because it contains aluminum. Pt reports she had kidney failure in the past. Informed pt I would get clearance from Dr Fabian Sharp and call her back. Dr Fabian Sharp, is the carafate safe for pt; her last BUN and CREAT were normal on last lab check? Thanks.

## 2011-07-25 NOTE — Telephone Encounter (Signed)
Informed pt she may begin the carafate per Dr Fabian Sharp d/t her last labs were normal; pt stated understanding.

## 2011-08-05 ENCOUNTER — Encounter: Payer: Self-pay | Admitting: *Deleted

## 2011-08-12 ENCOUNTER — Ambulatory Visit (INDEPENDENT_AMBULATORY_CARE_PROVIDER_SITE_OTHER): Payer: Medicare Other | Admitting: Gastroenterology

## 2011-08-12 ENCOUNTER — Encounter: Payer: Self-pay | Admitting: Gastroenterology

## 2011-08-12 VITALS — BP 128/64 | HR 88 | Ht 63.0 in | Wt 162.0 lb

## 2011-08-12 DIAGNOSIS — K589 Irritable bowel syndrome without diarrhea: Secondary | ICD-10-CM

## 2011-08-12 DIAGNOSIS — E538 Deficiency of other specified B group vitamins: Secondary | ICD-10-CM

## 2011-08-12 DIAGNOSIS — Z9089 Acquired absence of other organs: Secondary | ICD-10-CM

## 2011-08-12 DIAGNOSIS — K219 Gastro-esophageal reflux disease without esophagitis: Secondary | ICD-10-CM

## 2011-08-12 DIAGNOSIS — Z9049 Acquired absence of other specified parts of digestive tract: Secondary | ICD-10-CM

## 2011-08-12 DIAGNOSIS — K222 Esophageal obstruction: Secondary | ICD-10-CM

## 2011-08-12 MED ORDER — CILIDINIUM-CHLORDIAZEPOXIDE 2.5-5 MG PO CAPS: 1.0000 | ORAL_CAPSULE | Freq: Three times a day (TID) | ORAL | Status: DC

## 2011-08-12 MED ORDER — ALIGN PO CAPS: 1.0000 | ORAL_CAPSULE | Freq: Every day | ORAL | Status: AC

## 2011-08-12 MED ORDER — CYANOCOBALAMIN 1000 MCG/ML IJ SOLN
1000.0000 ug | INTRAMUSCULAR | Status: AC
  Administered 2011-08-12 – 2011-08-27 (×3): 1000 ug via INTRAMUSCULAR

## 2011-08-12 NOTE — Progress Notes (Addendum)
History of Present Illness:  This is a 71 year old Caucasian female with chronic IBS, diarrhea predominant with a previous negative GI evaluation including colonoscopy and random colon biopsies. She continues with gas, bloating, and occasional fecal incontinency. However, her main complaint today is continued acid reflux and regurgitation within a INTERMINITE solid food dysphagia despite Prilosec 20 mg a day and recent when necessary Carafate suspension. Last endoscopic exam was several years ago. She does have a history of previous renal failure from NSAID use, denies use of NSAIDs at this time. Other complaints are increased phlegm in her throat, burning in her substernal and throat area, and loss of appetite with an alleged 10 pound weight loss. She denies any specific food intolerances, and does not use sorbitol or fructose, and denies lactose intolerance.Marland Kitchen Her history is positive for anxiety and depression she is on Cymbalta, Ultram, Ambien, and Ativan. She is status post cholecystectomy. Also the patient not abuse of alcohol or cigarettes. Previous CT scans have been unremarkable. CBC and multi-chemistry profiles have been negative, she does have a history of B12 deficiency, and is not taking B12 replacement as suggested.  I have reviewed this patient's present history, medical and surgical past history, allergies and medications.     ROS: The remainder of the 10 point ROS is negative... she does complain of allergic sinusitis, chronic anxiety, fatigue, fibromyalgia symptoms, and might muscle cramps.     Physical Exam: Blood pressure 120/64, pulse 88 and regular, weight 162 pounds, BMI 28.7. General well developed well nourished patient in no acute distress, appearing her stated age. Very verbose patient making history and exam extremely difficult and very time consuming. Eyes PERRLA, no icterus, fundoscopic exam per opthamologist Skin no lesions noted Neck supple, no adenopathy, no thyroid  enlargement, no tenderness Chest clear to percussion and auscultation Heart no significant murmurs, gallops or rubs noted Abdomen no hepatosplenomegaly masses or tenderness, BS normal.  Rectal inspection normal no fissures, or fistulae noted.  No masses or tenderness on digital exam. Stool guaiac negative. Extremities no acute joint lesions, edema, phlebitis or evidence of cellulitis. Neurologic patient oriented x 3, cranial nerves intact, no focal neurologic deficits noted. Psychological mental status normal and normal affect.  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 She has classic IBS with alternating diarrhea and constipation. We will schedule her for outpatient endoscopy, dilation, and also small bowel biopsy. I prescribed when necessary Librax for IBS with daily probiotic therapy. I have started her parenteral B12 replacement therapy.  Encounter Diagnoses  Name Primary?  . Esophageal stricture Yes  . Vitamin B12 deficiency

## 2011-08-12 NOTE — Patient Instructions (Signed)
Your procedure has been scheduled for 08/13/2011, please follow the seperate instructions.  Take Align once a day, samples give once they are gone you can buy this OTC.  Follow the FODMAP diet given today.  You were given your first weekly b12 injection today you will need two more weekly injections before you start monthly injections. Please stop by the front desk and make next weeks appt for your second b12 injection.

## 2011-08-13 ENCOUNTER — Ambulatory Visit (AMBULATORY_SURGERY_CENTER): Payer: Medicare Other | Admitting: Gastroenterology

## 2011-08-13 ENCOUNTER — Encounter: Payer: Self-pay | Admitting: Gastroenterology

## 2011-08-13 ENCOUNTER — Telehealth: Payer: Self-pay | Admitting: *Deleted

## 2011-08-13 VITALS — BP 117/60 | HR 57 | Temp 98.8°F | Resp 20 | Ht 63.0 in | Wt 162.0 lb

## 2011-08-13 DIAGNOSIS — R1310 Dysphagia, unspecified: Secondary | ICD-10-CM

## 2011-08-13 DIAGNOSIS — R1314 Dysphagia, pharyngoesophageal phase: Secondary | ICD-10-CM

## 2011-08-13 DIAGNOSIS — K2950 Unspecified chronic gastritis without bleeding: Secondary | ICD-10-CM

## 2011-08-13 DIAGNOSIS — R1319 Other dysphagia: Secondary | ICD-10-CM

## 2011-08-13 DIAGNOSIS — K222 Esophageal obstruction: Secondary | ICD-10-CM

## 2011-08-13 DIAGNOSIS — R109 Unspecified abdominal pain: Secondary | ICD-10-CM

## 2011-08-13 MED ORDER — OMEPRAZOLE 20 MG PO CPDR: 20.0000 mg | DELAYED_RELEASE_CAPSULE | Freq: Two times a day (BID) | ORAL | Status: DC

## 2011-08-13 MED ORDER — SODIUM CHLORIDE 0.9 % IV SOLN: 500.0000 mL | INTRAVENOUS | Status: DC

## 2011-08-13 NOTE — Patient Instructions (Signed)
See the dilatation diet for the rest of the day.  Handouts given on gastritis, GERD, Hiatal hernia to your care partner.  Resume your prior medications today.  Please call if any questions or concerns.    YOU HAD AN ENDOSCOPIC PROCEDURE TODAY AT THE [facility]: Refer to the procedure report that was given to you for any specific questions about what was found during the examination.  If the procedure report does not answer your questions, please call your gastroenterologist to clarify.  If you requested that your care partner not be given the details of your procedure findings, then the procedure report has been included in a sealed envelope for you to review at your convenience later.  YOU SHOULD EXPECT: Some feelings of bloating in the abdomen. Passage of more gas than usual.  Walking can help get rid of the air that was put into your GI tract during the procedure and reduce the bloating. If you had a lower endoscopy (such as a colonoscopy or flexible sigmoidoscopy) you may notice spotting of blood in your stool or on the toilet paper. If you underwent a bowel prep for your procedure, then you may not have a normal bowel movement for a few days.  DIET: See the dilatation diet for the rest of the day.  Avoid alcohol for 24 hours.  ACTIVITY: Your care partner should take you home directly after the procedure.  You should plan to take it easy, moving slowly for the rest of the day.  You can resume normal activity the day after the procedure however you should NOT DRIVE or use heavy machinery for 24 hours (because of the sedation medicines used during the test).    SYMPTOMS TO REPORT IMMEDIATELY: A gastroenterologist can be reached at any hour.  During normal business hours, 8:30 AM to 5:00 PM Monday through Friday, call [phone].  After hours and on weekends, please call the GI answering service at [phone] who will take a message and have the physician on call contact  you.     Following upper endoscopy (EGD)  Vomiting of blood or coffee ground material  New chest pain or pain under the shoulder blades  Painful or persistently difficult swallowing  New shortness of breath  Fever of 100F or higher  Black, tarry-looking stools  FOLLOW UP: If any biopsies were taken you will be contacted by phone or by letter within the next 1-3 weeks.  Call your gastroenterologist if you have not heard about the biopsies in 3 weeks.  Our staff will call the home number listed on your records the next business day following your procedure to check on you and address any questions or concerns that you may have at that time regarding the information given to you following your procedure. This is a courtesy call and so if there is no answer at the home number and we have not heard from you through the emergency physician on call, we will assume that you have returned to your regular daily activities without incident.  SIGNATURES/CONFIDENTIALITY: You and/or your care partner have signed paperwork which will be entered into your electronic medical record.  These signatures attest to the fact that that the information above on your After Visit Summary has been reviewed and is understood.  Full responsibility of the confidentiality of this discharge information lies with you and/or your care-partner.

## 2011-08-13 NOTE — Telephone Encounter (Signed)
Dr Jarold Motto has requested that patient be given a generic PPI twice daily. Rx sent for omeprazole twice daily.

## 2011-08-13 NOTE — Progress Notes (Signed)
PROPOFOL PER S CAMP CRNA. SEE SCANNED INTRA PROCEDURE REPORT. EWM 

## 2011-08-13 NOTE — Progress Notes (Signed)
Addended by: Tonita Cong A on: 08/13/2011 04:56 PM   Modules accepted: Orders

## 2011-08-13 NOTE — Op Note (Signed)
[facility] [address] [place], [place]  [address]  ENDOSCOPY PROCEDURE REPORT  PATIENT:  [name] [name], [name] [name]  MR#:  [id number] BIRTHDATE:  02-17-1941, 70 yrs. old  GENDER:  female  ENDOSCOPIST:  Vania Rea. Jarold Motto, MD, [facility] Referred by:  PROCEDURE DATE:  08/13/2011 PROCEDURE:  EGD with biopsy, 43239, [place] Dilation of Esophagus ASA CLASS:  Class III INDICATIONS:  GERD, dysphagia  MEDICATIONS:   propofol (Diprivan) 100 mg IV TOPICAL ANESTHETIC:  DESCRIPTION OF PROCEDURE:   After the risks and benefits of the procedure were explained, informed consent was obtained.  The LB GIF-H180 [id number] endoscope was introduced through the mouth and advanced to the second portion of the duodenum.  The instrument was slowly withdrawn as the mucosa was fully examined. <<PROCEDUREIMAGES>>  Mild gastritis was found in the antrum. CLO BX. DONE.  A hiatal hernia was found. LARGE 6-7 CM. HIATIAL HERNIA NOTED.SEE PICTURES. The duodenal bulb was normal in appearance, as was the postbulbar duodenum.  The esophagus and gastroesophageal junction were completely normal in appearance.    Retroflexed views revealed a hiatal hernia.  LARGE HH.  The scope was then withdrawn from the patient and the procedure completed.  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 RECOMMENDATIONS: 1) Clear liquids until, then soft foods rest iof day. Resume prior diet tomorrow. 2) Rx CLO if positive 3) continue PPI  ______________________________ Vania Rea. Jarold Motto, MD, [facility]  CC:  Madelin Headings, MD  n. Rosalie DoctorVania Rea. Culver Feighner at 08/13/2011 02:34 PM  Sabino Donovan, [phone]

## 2011-08-13 NOTE — Progress Notes (Signed)
No complaints noted in the recovery room. Maw   

## 2011-08-13 NOTE — Progress Notes (Signed)
Patient did not experience any of the following events: a burn prior to discharge; a fall within the facility; wrong site/side/patient/procedure/implant event; or a hospital transfer or hospital admission upon discharge from the facility. (G8907) Patient did not have preoperative order for IV antibiotic SSI prophylaxis. (G8918)  

## 2011-08-14 ENCOUNTER — Telehealth: Payer: Self-pay | Admitting: *Deleted

## 2011-08-14 NOTE — Telephone Encounter (Signed)
  Follow up Call-  Call back number 08/13/2011  Post procedure Call Back phone  # [phone]  Permission to leave phone message Yes     Patient questions:  Do you have a fever, pain , or abdominal swelling? no Pain Score  0 *  Have you tolerated food without any problems? yes  Have you been able to return to your normal activities? yes  Do you have any questions about your discharge instructions: Diet   no Medications  no Follow up visit  no  Do you have questions or concerns about your Care? no  Actions: * If pain score is 4 or above: No action needed, pain <4.

## 2011-08-15 ENCOUNTER — Encounter: Payer: Self-pay | Admitting: Gastroenterology

## 2011-08-19 ENCOUNTER — Ambulatory Visit (INDEPENDENT_AMBULATORY_CARE_PROVIDER_SITE_OTHER): Payer: Medicare Other | Admitting: Gastroenterology

## 2011-08-19 DIAGNOSIS — E538 Deficiency of other specified B group vitamins: Secondary | ICD-10-CM

## 2011-08-27 ENCOUNTER — Ambulatory Visit (INDEPENDENT_AMBULATORY_CARE_PROVIDER_SITE_OTHER): Payer: Medicare Other | Admitting: Gastroenterology

## 2011-08-27 DIAGNOSIS — D518 Other vitamin B12 deficiency anemias: Secondary | ICD-10-CM

## 2011-08-27 DIAGNOSIS — E538 Deficiency of other specified B group vitamins: Secondary | ICD-10-CM

## 2011-09-03 ENCOUNTER — Ambulatory Visit (INDEPENDENT_AMBULATORY_CARE_PROVIDER_SITE_OTHER): Payer: Medicare Other | Admitting: Gastroenterology

## 2011-09-03 DIAGNOSIS — E538 Deficiency of other specified B group vitamins: Secondary | ICD-10-CM

## 2011-09-03 MED ORDER — CYANOCOBALAMIN 1000 MCG/ML IJ SOLN
1000.0000 ug | Freq: Once | INTRAMUSCULAR | Status: AC
  Administered 2011-09-03: 1000 ug via INTRAMUSCULAR

## 2011-09-14 ENCOUNTER — Other Ambulatory Visit: Payer: Self-pay | Admitting: Internal Medicine

## 2011-09-18 ENCOUNTER — Other Ambulatory Visit: Payer: Self-pay | Admitting: Gastroenterology

## 2011-09-18 ENCOUNTER — Other Ambulatory Visit: Payer: Self-pay

## 2011-09-18 NOTE — Telephone Encounter (Signed)
Ok to refill each x 1    Due for ROV  Before more refills after that

## 2011-09-18 NOTE — Telephone Encounter (Signed)
Rx request for lorazepam 0.5 mg.  Rx last filled 03/22/11.

## 2011-09-18 NOTE — Telephone Encounter (Signed)
Rx request for zolpidem tartrate 10 mg tablet.  Rx last filled 03/22/11.  Pt last seen 07/18/11. Pls advise.

## 2011-09-19 MED ORDER — LORAZEPAM 0.5 MG PO TABS: 0.5000 mg | ORAL_TABLET | Freq: Two times a day (BID) | ORAL | Status: DC | PRN

## 2011-09-19 NOTE — Telephone Encounter (Signed)
Rx sent to pharmacy. Called and spoke with pt and pt is aware to make an appt before refills run out.

## 2011-09-20 ENCOUNTER — Other Ambulatory Visit: Payer: Self-pay

## 2011-09-20 MED ORDER — ZOLPIDEM TARTRATE 10 MG PO TABS: 10.0000 mg | ORAL_TABLET | Freq: Every evening | ORAL | Status: DC | PRN

## 2011-09-20 NOTE — Telephone Encounter (Signed)
Rx called in for zolpidem tartrate.

## 2011-10-02 ENCOUNTER — Other Ambulatory Visit: Payer: Self-pay | Admitting: Internal Medicine

## 2011-10-02 ENCOUNTER — Telehealth: Payer: Self-pay | Admitting: Family Medicine

## 2011-10-02 NOTE — Telephone Encounter (Signed)
The pt is requesting refills of zolpidem 10mg .  She was last seen on 07/18/11 and has a future appt for 10/03/11.  Please advise.

## 2011-10-02 NOTE — Telephone Encounter (Signed)
Her ov tomorrow is for a b12 inj at Gi  Last ov with me was march .  Can refill x 1    But  She needs OV  Before more refills

## 2011-10-03 ENCOUNTER — Ambulatory Visit (INDEPENDENT_AMBULATORY_CARE_PROVIDER_SITE_OTHER): Payer: Medicare Other | Admitting: Gastroenterology

## 2011-10-03 ENCOUNTER — Other Ambulatory Visit: Payer: Self-pay | Admitting: Family Medicine

## 2011-10-03 DIAGNOSIS — E538 Deficiency of other specified B group vitamins: Secondary | ICD-10-CM

## 2011-10-03 MED ORDER — CYANOCOBALAMIN 1000 MCG/ML IJ SOLN
1000.0000 ug | Freq: Once | INTRAMUSCULAR | Status: AC
  Administered 2011-10-03: 1000 ug via INTRAMUSCULAR

## 2011-10-03 MED ORDER — ZOLPIDEM TARTRATE 10 MG PO TABS: 10.0000 mg | ORAL_TABLET | Freq: Every evening | ORAL | Status: DC | PRN

## 2011-10-03 NOTE — Telephone Encounter (Signed)
Prescription was called into the pharmacy.  Spoke to [place].  Sent #30 with 0 refills.  Pt. MUST make OV before anymore refills can be sent.  Gave information to Arna Medici at the front desk to make this appt.

## 2011-10-28 ENCOUNTER — Other Ambulatory Visit: Payer: Self-pay | Admitting: Internal Medicine

## 2011-11-04 ENCOUNTER — Ambulatory Visit (INDEPENDENT_AMBULATORY_CARE_PROVIDER_SITE_OTHER): Payer: Medicare Other | Admitting: Gastroenterology

## 2011-11-04 DIAGNOSIS — E538 Deficiency of other specified B group vitamins: Secondary | ICD-10-CM

## 2011-11-04 MED ORDER — CYANOCOBALAMIN 1000 MCG/ML IJ SOLN
1000.0000 ug | Freq: Once | INTRAMUSCULAR | Status: AC
  Administered 2011-11-04: 1000 ug via INTRAMUSCULAR

## 2011-11-12 ENCOUNTER — Other Ambulatory Visit: Payer: Self-pay | Admitting: Internal Medicine

## 2011-11-14 ENCOUNTER — Encounter: Payer: Self-pay | Admitting: Internal Medicine

## 2011-11-14 ENCOUNTER — Ambulatory Visit (INDEPENDENT_AMBULATORY_CARE_PROVIDER_SITE_OTHER): Payer: Medicare Other | Admitting: Internal Medicine

## 2011-11-14 VITALS — BP 124/70 | HR 67 | Temp 97.9°F | Wt 161.0 lb

## 2011-11-14 DIAGNOSIS — D649 Anemia, unspecified: Secondary | ICD-10-CM

## 2011-11-14 DIAGNOSIS — Y639 Failure in dosage during unspecified surgical and medical care: Secondary | ICD-10-CM

## 2011-11-14 DIAGNOSIS — E538 Deficiency of other specified B group vitamins: Secondary | ICD-10-CM | POA: Insufficient documentation

## 2011-11-14 DIAGNOSIS — F411 Generalized anxiety disorder: Secondary | ICD-10-CM

## 2011-11-14 DIAGNOSIS — F4323 Adjustment disorder with mixed anxiety and depressed mood: Secondary | ICD-10-CM

## 2011-11-14 DIAGNOSIS — I10 Essential (primary) hypertension: Secondary | ICD-10-CM

## 2011-11-14 DIAGNOSIS — G479 Sleep disorder, unspecified: Secondary | ICD-10-CM

## 2011-11-14 DIAGNOSIS — Z87442 Personal history of urinary calculi: Secondary | ICD-10-CM

## 2011-11-14 DIAGNOSIS — Z91120 Patient's intentional underdosing of medication regimen due to financial hardship: Secondary | ICD-10-CM

## 2011-11-14 DIAGNOSIS — Z79899 Other long term (current) drug therapy: Secondary | ICD-10-CM

## 2011-11-14 DIAGNOSIS — M1990 Unspecified osteoarthritis, unspecified site: Secondary | ICD-10-CM

## 2011-11-14 DIAGNOSIS — E785 Hyperlipidemia, unspecified: Secondary | ICD-10-CM

## 2011-11-14 DIAGNOSIS — R42 Dizziness and giddiness: Secondary | ICD-10-CM

## 2011-11-14 LAB — BASIC METABOLIC PANEL
BUN: 18 mg/dL (ref 6–23)
CO2: 27 mEq/L (ref 19–32)
Chloride: 106 mEq/L (ref 96–112)
Potassium: 4.4 mEq/L (ref 3.5–5.1)

## 2011-11-14 LAB — LIPID PANEL
HDL: 75.9 mg/dL (ref >39.00)
LDL Cholesterol: 65 mg/dL (ref 0–99)
Total CHOL/HDL Ratio: 2
VLDL: 16.2 mg/dL (ref 0.0–40.0)

## 2011-11-14 LAB — VITAMIN B12: Vitamin B-12: 619 pg/mL (ref 211–911)

## 2011-11-14 LAB — CBC WITH DIFFERENTIAL/PLATELET
Basophils Relative: 1 % (ref 0.0–3.0)
HCT: 42 % (ref 36.0–46.0)
Hemoglobin: 13.9 g/dL (ref 12.0–15.0)
Lymphocytes Relative: 27 % (ref 12.0–46.0)
Monocytes Relative: 5.8 % (ref 3.0–12.0)
Neutro Abs: 2.8 10*3/uL (ref 1.4–7.7)
RBC: 4.7 Mil/uL (ref 3.87–5.11)

## 2011-11-14 LAB — TSH: TSH: 2.32 u[IU]/mL (ref 0.35–5.50)

## 2011-11-14 LAB — HEPATIC FUNCTION PANEL: Albumin: 4 g/dL (ref 3.5–5.2)

## 2011-11-14 LAB — SEDIMENTATION RATE: Sed Rate: 8 mm/hr (ref 0–22)

## 2011-11-14 MED ORDER — LORAZEPAM 0.5 MG PO TABS: 0.5000 mg | ORAL_TABLET | Freq: Two times a day (BID) | ORAL | Status: DC | PRN

## 2011-11-14 MED ORDER — ZOLPIDEM TARTRATE 10 MG PO TABS: 10.0000 mg | ORAL_TABLET | Freq: Every evening | ORAL | Status: DC | PRN

## 2011-11-14 MED ORDER — METOPROLOL TARTRATE 50 MG PO TABS: 50.0000 mg | ORAL_TABLET | Freq: Two times a day (BID) | ORAL | Status: DC

## 2011-11-14 MED ORDER — ROSUVASTATIN CALCIUM 40 MG PO TABS: 40.0000 mg | ORAL_TABLET | Freq: Every day | ORAL | Status: DC

## 2011-11-14 MED ORDER — AMLODIPINE BESYLATE 5 MG PO TABS: 5.0000 mg | ORAL_TABLET | Freq: Every day | ORAL | Status: DC

## 2011-11-14 MED ORDER — TRAMADOL HCL 50 MG PO TABS: 50.0000 mg | ORAL_TABLET | Freq: Four times a day (QID) | ORAL | Status: DC | PRN

## 2011-11-14 MED ORDER — OMEPRAZOLE 20 MG PO CPDR: 20.0000 mg | DELAYED_RELEASE_CAPSULE | Freq: Two times a day (BID) | ORAL | Status: DC

## 2011-11-14 NOTE — Patient Instructions (Addendum)
susspect that some of the dizziness could be from medications and/or interactions. Also it is possible when you stand up your blood pressure drops. Would avoid the librax unless needed.  dont take the ativan and ambien at the same time. Can try 25- 50 tramadol for pain at night   But an og these can cause  Dizziness.  Your bp is in the 108 - 118 range.  We should  decrease the norvasc to 1/2 per day also. 2.5 per day and see what the BP results are.   Will notify you  of labs when available.  ROV in 2-3 months and brng in the BP machine.  If you feel up to it checked her readings a few times a week. Include the pulse. We'll review when you come back  Consider decreasing the omeprezole to once a day if doing well.

## 2011-11-14 NOTE — Progress Notes (Signed)
Subjective:    Patient ID: [name] [name], female    DOB: 1940-10-31, 71 y.o.   MRN: [id number]  HPI Patient comes in today for follow up of  multiple medical problems.  See last note  Missed appt  Having GI work up.  GI :Has now had   Esophageal stretching FOD map  Now less indigestion  Now    And less burping. And using align.  Now has  carafate and libras   Helps . rarely uses librax  On omeprezole bid  gerd better  Moods    Intermittent orthostatic lightheadedness.   For the last months weeks  Usually in am stopped the librax and got some better  But still a problem  Has more energy since increasing the b12  Had been on weekly shots and now monthly for 2 months   No b12 level done. hasnt checked her bp dec the amlo to 2.5 for a while and now back up ? Why ? Cost  Has expensive gi meds. Sleep needs Remus Loffler and has been out for  3 weeks. Take ativan prn for this reason.   OA  Takes prn tramadol 1/2 hs or so not that great a control.  Did better on cymbalta but had to stop cause of cost although has some samples at home.   Cataract surgery.  Helps vision Right shoulder hurts  Due for shoulder replacement but doesn't want this.  Review of Systems Neg fever cp sob bleeding  Hx of fall sideways once  In kitchen no  Injury happened about 2-3 months ago . Rest of ros as er hpi or English today  Past history family history social history reviewed in the electronic medical record. Outpatient Encounter Prescriptions as of 11/14/2011  Medication Sig Dispense Refill  . amLODipine (NORVASC) 5 MG tablet Take 1 tablet (5 mg total) by mouth daily.  30 tablet  5  . bifidobacterium infantis (ALIGN) capsule Take 1 capsule by mouth daily.  14 capsule  0  . CARAFATE 1 GM/10ML suspension TAKE BY MOUTH BEFORE MEALS AND AT BEDTIME.  1200 mL  1  . cetirizine (ZYRTEC) 10 MG tablet Take 1 tablet (10 mg total) by mouth daily.  30 tablet  1  . clidinium-chlordiazePOXIDE (LIBRAX) 2.5-5 MG per capsule Take 1 capsule  by mouth 3 (three) times daily.  90 capsule  1  . ketoconazole (NIZORAL) 2 % cream Apply 1 application topically daily.        Marland Kitchen LORazepam (ATIVAN) 0.5 MG tablet Take 1 tablet (0.5 mg total) by mouth 2 (two) times daily as needed.  30 tablet  1  . metoprolol (LOPRESSOR) 50 MG tablet Take 1 tablet (50 mg total) by mouth 2 (two) times daily.  30 tablet  5  . omeprazole (PRILOSEC) 20 MG capsule TAKE 1 CAPSULE TWICE A DAY OR AS DIRECTED  60 capsule  5  . omeprazole (PRILOSEC) 20 MG capsule Take 1 capsule (20 mg total) by mouth 2 (two) times daily.  60 capsule  5  . rosuvastatin (CRESTOR) 40 MG tablet Take 1 tablet (40 mg total) by mouth daily.  30 tablet  5  . traMADol (ULTRAM) 50 MG tablet Take 1 tablet (50 mg total) by mouth every 6 (six) hours as needed for pain.  45 tablet  1  . zolpidem (AMBIEN) 10 MG tablet Take 1 tablet (10 mg total) by mouth at bedtime as needed.  30 tablet  5  . DISCONTD: amLODipine (NORVASC) 5  MG tablet TAKE 1 TABLET BY MOUTH EVERY DAY  30 tablet  3  . DISCONTD: CRESTOR 40 MG tablet TAKE 1 TABLET BY MOUTH EVERY DAY  30 tablet  5  . DISCONTD: DULoxetine (CYMBALTA) 60 MG capsule Take 1 capsule (60 mg total) by mouth daily.  30 capsule  2  . DISCONTD: LORazepam (ATIVAN) 0.5 MG tablet Take 1 tablet (0.5 mg total) by mouth 2 (two) times daily as needed.  30 tablet  1  . DISCONTD: metoprolol (LOPRESSOR) 50 MG tablet TAKE 1/2 TABLET BY MOUTH TWICE A DAY  30 tablet  5  . DISCONTD: omeprazole (PRILOSEC) 20 MG capsule Take 1 capsule (20 mg total) by mouth 2 (two) times daily.  60 capsule  3  . DISCONTD: traMADol (ULTRAM) 50 MG tablet TAKE 1 TABLET EVERY 6 HOURS AS NEEDED  60 tablet  1  . DISCONTD: zolpidem (AMBIEN) 10 MG tablet Take 1 tablet (10 mg total) by mouth at bedtime as needed.  30 tablet  1       Objective:   Physical Exam BP 124/70  Pulse 67  Temp 97.9 F (36.6 C) (Oral)  Wt 161 lb (73.029 kg)  SpO2 98% WDWN in nad looks well today  Wt Readings from Last 3  Encounters:  11/14/11 161 lb (73.029 kg)  08/13/11 162 lb (73.483 kg)  08/12/11 162 lb (73.483 kg)  WDWn in nad  Not depressed looking  Neck: Supple without adenopathy or masses or bruits  Chest:  Clear to A&P without wheezes rales or rhonchi CV:  S1-S2 no gallops or murmurs peripheral perfusion is normal Bp readings 106/82 large sit right 118/70 reg sit  Stand no sig changes pulse 60 and reg  Neuro non focal  Affect very talkative  No tremor.    Assessment & Plan:   HT controlled poss need to back off the med with her ortho dizziness vs other drug IA  Due for rft.  OA   Pain cannot take NSAIDS  cymbalta helped her anxiety and pain but cannot afford at present in donut hole. Tramadol prn IBS better  Dietary and align gerd on carafate and ppi  Ortho static dizz   Try dec norvasc to 1/2 pil perday  Sleep refill ambien with caution and avoid taking without cns meds  LIPIDS  Due for labs soon ok to refill  Anxiety  Off cymbalta  b12 deficincy  Feels better on shots   Needs repeat  b12 level and check.  Total visit > 50% spent counseling and coordinating care   Reviewed meds and how she is taking them and potential se .   ROV in 2 month or so and bring in bp monitor to check. Call in meantime.

## 2011-12-12 ENCOUNTER — Ambulatory Visit (INDEPENDENT_AMBULATORY_CARE_PROVIDER_SITE_OTHER): Payer: Medicare Other | Admitting: Gastroenterology

## 2011-12-12 DIAGNOSIS — E538 Deficiency of other specified B group vitamins: Secondary | ICD-10-CM

## 2011-12-12 MED ORDER — CYANOCOBALAMIN 1000 MCG/ML IJ SOLN
1000.0000 ug | Freq: Once | INTRAMUSCULAR | Status: AC
  Administered 2011-12-12: 1000 ug via INTRAMUSCULAR

## 2012-01-13 ENCOUNTER — Telehealth: Payer: Self-pay | Admitting: *Deleted

## 2012-01-13 ENCOUNTER — Ambulatory Visit (INDEPENDENT_AMBULATORY_CARE_PROVIDER_SITE_OTHER): Payer: Medicare Other | Admitting: Gastroenterology

## 2012-01-13 DIAGNOSIS — E538 Deficiency of other specified B group vitamins: Secondary | ICD-10-CM

## 2012-01-13 MED ORDER — CYANOCOBALAMIN 1000 MCG/ML IJ SOLN
1000.0000 ug | Freq: Once | INTRAMUSCULAR | Status: AC
  Administered 2012-01-13: 1000 ug via INTRAMUSCULAR

## 2012-01-13 NOTE — Telephone Encounter (Signed)
Patient and I discussed Gluten free diet information when she came for her B12 injections.

## 2012-01-16 ENCOUNTER — Ambulatory Visit (INDEPENDENT_AMBULATORY_CARE_PROVIDER_SITE_OTHER): Payer: Medicare Other | Admitting: Internal Medicine

## 2012-01-16 ENCOUNTER — Encounter: Payer: Self-pay | Admitting: Internal Medicine

## 2012-01-16 VITALS — BP 126/74 | HR 85 | Temp 98.4°F | Wt 163.0 lb

## 2012-01-16 DIAGNOSIS — E559 Vitamin D deficiency, unspecified: Secondary | ICD-10-CM

## 2012-01-16 DIAGNOSIS — Z23 Encounter for immunization: Secondary | ICD-10-CM

## 2012-01-16 DIAGNOSIS — Z8719 Personal history of other diseases of the digestive system: Secondary | ICD-10-CM

## 2012-01-16 DIAGNOSIS — F4323 Adjustment disorder with mixed anxiety and depressed mood: Secondary | ICD-10-CM

## 2012-01-16 DIAGNOSIS — I10 Essential (primary) hypertension: Secondary | ICD-10-CM

## 2012-01-16 DIAGNOSIS — M1990 Unspecified osteoarthritis, unspecified site: Secondary | ICD-10-CM

## 2012-01-16 DIAGNOSIS — D518 Other vitamin B12 deficiency anemias: Secondary | ICD-10-CM

## 2012-01-16 DIAGNOSIS — M549 Dorsalgia, unspecified: Secondary | ICD-10-CM

## 2012-01-16 DIAGNOSIS — Z87442 Personal history of urinary calculi: Secondary | ICD-10-CM

## 2012-01-16 DIAGNOSIS — M419 Scoliosis, unspecified: Secondary | ICD-10-CM

## 2012-01-16 DIAGNOSIS — R109 Unspecified abdominal pain: Secondary | ICD-10-CM

## 2012-01-16 DIAGNOSIS — M4120 Other idiopathic scoliosis, site unspecified: Secondary | ICD-10-CM

## 2012-01-16 LAB — C-REACTIVE PROTEIN: CRP: <0.5 mg/dL (ref 0.5–20.0)

## 2012-01-16 LAB — POCT URINALYSIS DIPSTICK
Nitrite, UA: NEGATIVE
Protein, UA: NEGATIVE
Urobilinogen, UA: 1
pH, UA: 6

## 2012-01-16 LAB — BASIC METABOLIC PANEL
BUN: 18 mg/dL (ref 6–23)
Chloride: 107 mEq/L (ref 96–112)
Creatinine, Ser: 0.8 mg/dL (ref 0.4–1.2)
GFR: 74.08 mL/min (ref >60.00)
Potassium: 3.8 mEq/L (ref 3.5–5.1)

## 2012-01-16 NOTE — Progress Notes (Signed)
Subjective:    Patient ID: [name] [name], female    DOB: Jan 06, 1941, 71 y.o.   MRN: [id number]  HPI Patient comes in today for follow up of  multiple medical problems.  BP readings usually 135 range  Or such  Over 78   Forgot machine  Today.  Light headed and dizzy  From librax/ not taking 3  X per day.  And less light headed.   Trying the pain medication at night with modest help she is having discomfort her lower back sides of her abdomen that is not from her bowel neck pain sometimes in hands and arms her wonders if she needs to go back to the neurosurgeon. She's not taking the Cymbalta because of cost  Outpatient Encounter Prescriptions as of 01/16/2012  Medication Sig Dispense Refill  . amLODipine (NORVASC) 5 MG tablet Take 1 tablet (5 mg total) by mouth daily.  30 tablet  5  . bifidobacterium infantis (ALIGN) capsule Take 1 capsule by mouth daily.  14 capsule  0  . CARAFATE 1 GM/10ML suspension TAKE BY MOUTH BEFORE MEALS AND AT BEDTIME.  1200 mL  1  . cetirizine (ZYRTEC) 10 MG tablet Take 1 tablet (10 mg total) by mouth daily.  30 tablet  1  . clidinium-chlordiazePOXIDE (LIBRAX) 2.5-5 MG per capsule Take 1 capsule by mouth 3 (three) times daily.  90 capsule  1  . ketoconazole (NIZORAL) 2 % cream Apply 1 application topically daily.        Marland Kitchen LORazepam (ATIVAN) 0.5 MG tablet Take 1 tablet (0.5 mg total) by mouth 2 (two) times daily as needed.  30 tablet  1  . metoprolol (LOPRESSOR) 50 MG tablet Take 1 tablet (50 mg total) by mouth 2 (two) times daily.  30 tablet  5  . omeprazole (PRILOSEC) 20 MG capsule Take 1 capsule (20 mg total) by mouth 2 (two) times daily.  60 capsule  5  . rosuvastatin (CRESTOR) 40 MG tablet Take 1 tablet (40 mg total) by mouth daily.  30 tablet  5  . traMADol (ULTRAM) 50 MG tablet Take 1 tablet (50 mg total) by mouth every 6 (six) hours as needed for pain.  45 tablet  1  . zolpidem (AMBIEN) 10 MG tablet Take 1 tablet (10 mg total) by mouth at bedtime as needed.   30 tablet  5    Review of Systems Negative for current chest pain shortness of breath new cough syncope or falling. Anxiety is okay pretty okay sleep is difficult because of pain. She takes is Librax twice a day as needed. Not taking Ativan Past history family history social history reviewed in the electronic medical record.     Objective:   Physical Exam BP 126/74  Pulse 85  Temp 98.4 F (36.9 C) (Oral)  Wt 163 lb (73.936 kg) Well-developed well-nourished with obvious arthritis changes on the hands. In no acute distress but uncomfortable with the back. She is cognitively intact and gait is pretty good.  gynecomastia scoliosis of her spine.  Walks a bit tilted.  Normal color skin no unusual bruising slight swelling in the right lateral wrist she states it was from accidentally hitting it. Lab Results  Component Value Date   WBC 4.5 11/14/2011   HGB 13.9 11/14/2011   HCT 42.0 11/14/2011   PLT 143.0* 11/14/2011   GLUCOSE 96 11/14/2011   CHOL 157 11/14/2011   TRIG 81.0 11/14/2011   HDL 75.90 11/14/2011   LDLDIRECT 179.1 10/09/2007  LDLCALC 65 11/14/2011   ALT 14 11/14/2011   AST 20 11/14/2011   NA 140 11/14/2011   K 4.4 11/14/2011   CL 106 11/14/2011   CREATININE 1.0 11/14/2011   BUN 18 11/14/2011   CO2 27 11/14/2011   TSH 2.32 11/14/2011   HGBA1C 5.7 08/09/2009       Assessment & Plan:    Hypertension.   Stable forgot her monitor today  Wants handicapped. Sticker  Needs to bring in form  Arthritis and changes consistent with osteoarthritis but she also has significant spine pain with a history of a neurosurgery consult history of shoulder surgery and knee arthritis. She cannot take anti-inflammatories she has a history of acute renal failure on these. She is tolerating but we half would like to get an opinion from rheumatology about best plan of action. She had some response of pain to Cymbalta in the past but her insurance doesn't cover it and the cost is significant unsure if  she  should try to restart. Has a history of neurosurgical evaluation in regard to her neck and back and was told at some point she might need surgery.  She has scoliosis which probably aggravates some of her back problem.  B12 deficiency better on B12 shots but still has IBS symptoms at times and some very 6 and pains in the lower abdomen which sounded like radiating from the back. Gynecomastia possibly adding to neck pain but probably not total picture. Flu immunization today IBS  followed by Dr. Jarold Motto given Librax has some mild side effect of 3 times a day medication and only taking twice a day changes on the HR but he should do refills.   consult rheumatology followup in 4 months or as needed. Continue monitoring blood pressure appears to be controlled.  Total visit > 50% spent counseling and coordinating care

## 2012-01-16 NOTE — Patient Instructions (Addendum)
Will arrange rheumatology appt about your MS pain spine , and hands   ? Intervention   And may need to see spine surgeon again.   To help with the pain Will notify you  of labs when available. Your blood pressure is good. Continue b12 shots.  ROV in 4 months or as needed

## 2012-01-17 ENCOUNTER — Ambulatory Visit: Payer: Medicare Other | Admitting: Internal Medicine

## 2012-01-17 LAB — PTH, INTACT AND CALCIUM: PTH: 107.3 pg/mL — ABNORMAL HIGH (ref 14.0–72.0)

## 2012-01-31 ENCOUNTER — Telehealth: Payer: Self-pay | Admitting: Family Medicine

## 2012-01-31 ENCOUNTER — Other Ambulatory Visit: Payer: Self-pay | Admitting: Family Medicine

## 2012-01-31 DIAGNOSIS — E349 Endocrine disorder, unspecified: Secondary | ICD-10-CM

## 2012-01-31 MED ORDER — VITAMIN D (ERGOCALCIFEROL) 1.25 MG (50000 UNIT) PO CAPS: 50000.0000 [IU] | ORAL_CAPSULE | ORAL | Status: DC

## 2012-01-31 NOTE — Telephone Encounter (Signed)
Pt referred to endo per WP.

## 2012-01-31 NOTE — Telephone Encounter (Signed)
Should pt be sent to endo? PTH is high.  Please advise.

## 2012-02-04 ENCOUNTER — Other Ambulatory Visit: Payer: Self-pay | Admitting: Gastroenterology

## 2012-02-12 ENCOUNTER — Ambulatory Visit (INDEPENDENT_AMBULATORY_CARE_PROVIDER_SITE_OTHER): Payer: Medicare Other | Admitting: Gastroenterology

## 2012-02-12 ENCOUNTER — Ambulatory Visit (INDEPENDENT_AMBULATORY_CARE_PROVIDER_SITE_OTHER): Payer: Medicare Other | Admitting: Internal Medicine

## 2012-02-12 ENCOUNTER — Encounter: Payer: Self-pay | Admitting: Internal Medicine

## 2012-02-12 VITALS — BP 126/70 | HR 84 | Temp 98.5°F | Wt 157.0 lb

## 2012-02-12 DIAGNOSIS — K449 Diaphragmatic hernia without obstruction or gangrene: Secondary | ICD-10-CM

## 2012-02-12 DIAGNOSIS — E538 Deficiency of other specified B group vitamins: Secondary | ICD-10-CM

## 2012-02-12 DIAGNOSIS — L989 Disorder of the skin and subcutaneous tissue, unspecified: Secondary | ICD-10-CM | POA: Insufficient documentation

## 2012-02-12 DIAGNOSIS — K219 Gastro-esophageal reflux disease without esophagitis: Secondary | ICD-10-CM

## 2012-02-12 DIAGNOSIS — N63 Unspecified lump in unspecified breast: Secondary | ICD-10-CM

## 2012-02-12 DIAGNOSIS — N6325 Unspecified lump in the left breast, overlapping quadrants: Secondary | ICD-10-CM

## 2012-02-12 MED ORDER — CYANOCOBALAMIN 1000 MCG/ML IJ SOLN
1000.0000 ug | INTRAMUSCULAR | Status: DC
  Administered 2012-02-12 – 2012-05-14 (×2): 1000 ug via INTRAMUSCULAR

## 2012-02-12 NOTE — Patient Instructions (Signed)
Refer to breast center for asap evaluation for our breast lump.   Contact dr Jarold Motto office about the severe burning  Despite your medication for reflux.  Contact us is we can help with this.

## 2012-02-12 NOTE — Progress Notes (Signed)
Subjective:    Patient ID: [name] [name], female    DOB: 1940/08/26, 71 y.o.   MRN: [id number]  HPI Patient comes in today for SDA for  new problem evaluation. Last visit was end of September 13 . Decided to do a breast exam when she saw a card about self breast screening over the weekend and noted a new feeling nodule on the left breast. She denied any specific pain or other symptoms. She comes in today for evaluation. Her last mammogram was almost 2 years ago.Last mammo was diagnostic  in ehr 2011 dx and was normal . She's not had a history of a breast biopsy and no first degree relative with breast cancer. Other concerns she discusses:    Skin areas to see derm soon aresa that got sore and scabbed  these or not on her breast.  Gi epigastric buning and chest sx despite prilosec carafate and tums. History of a large hiatal hernia strongly suggest and agree that she go back and see Dr. Jarold Motto asked her GI contact us f neede about this  Review of Systems Neg sob cough fever uti sx bleeeing ulcerations breast dc. Past history family history social history reviewed in the electronic medical record. Outpatient Encounter Prescriptions as of 02/12/2012  Medication Sig Dispense Refill  . amLODipine (NORVASC) 5 MG tablet Take 1 tablet (5 mg total) by mouth daily.  30 tablet  5  . bifidobacterium infantis (ALIGN) capsule Take 1 capsule by mouth daily.  14 capsule  0  . CARAFATE 1 GM/10ML suspension TAKE 2 TEASPOONSFUL BEFORE MEALS AND AT BEDTIME  1200 mL  1  . cetirizine (ZYRTEC) 10 MG tablet Take 1 tablet (10 mg total) by mouth daily.  30 tablet  1  . clidinium-chlordiazePOXIDE (LIBRAX) 2.5-5 MG per capsule Take 1 capsule by mouth 3 (three) times daily. Bid as needed  90 capsule  1  . ketoconazole (NIZORAL) 2 % cream Apply 1 application topically daily.        Marland Kitchen LORazepam (ATIVAN) 0.5 MG tablet Take 1 tablet (0.5 mg total) by mouth 2 (two) times daily as needed.  30 tablet  1  . metoprolol  (LOPRESSOR) 50 MG tablet Take 1 tablet (50 mg total) by mouth 2 (two) times daily.  30 tablet  5  . omeprazole (PRILOSEC) 20 MG capsule Take 1 capsule (20 mg total) by mouth 2 (two) times daily.  60 capsule  5  . rosuvastatin (CRESTOR) 40 MG tablet Take 1 tablet (40 mg total) by mouth daily.  30 tablet  5  . traMADol (ULTRAM) 50 MG tablet Take 1 tablet (50 mg total) by mouth every 6 (six) hours as needed for pain.  45 tablet  1  . Vitamin D, Ergocalciferol, (DRISDOL) 50000 UNITS CAPS Take 1 capsule (50,000 Units total) by mouth every 7 (seven) days.  12 capsule  0  . zolpidem (AMBIEN) 10 MG tablet Take 1 tablet (10 mg total) by mouth at bedtime as needed.  30 tablet  5   Facility-Administered Encounter Medications as of 02/12/2012  Medication Dose Route Frequency Provider Last Rate Last Dose  . cyanocobalamin ((VITAMIN B-12)) injection 1,000 mcg  1,000 mcg Intramuscular Q30 days Mardella Layman, MD   1,000 mcg at 02/12/12 1157       Objective:   Physical Exam  BP 126/70  Pulse 84  Temp 98.5 F (36.9 C) (Oral)  Wt 157 lb (71.215 kg)  SpO2 98% wdwn in nad Abdomen:  Sof,t  normal bowel sounds without hepatosplenomegaly, no guarding rebound or masses no CVA tenderness Breast  Pendulous lumpy  Left with marble sized round smooth mobile firm lump at about 9 oclock with smal soft lump near by  No other discrete  .   Skin no acute changes 2 small area flat grey scale  2-3 mm chest no ulceration or mole with this Abdomen:  Sof,t normal bowel sounds without hepatosplenomegaly, no guarding rebound or masses no CVA tenderness    Assessment & Plan:  New onset breast lump Send for dx mammo and [place]  asap order place breast center Gi hx of HH large and gerd  Apparently not responsive to therapy. See Dr Jarold Motto. Skin prob ok but proceed with derm as she had planned

## 2012-02-17 ENCOUNTER — Ambulatory Visit
Admission: RE | Admit: 2012-02-17 | Discharge: 2012-02-17 | Disposition: A | Discharge status: Home / Self Care | Payer: Medicare Other | Source: Ambulatory Visit | Attending: Internal Medicine | Admitting: Internal Medicine

## 2012-02-17 ENCOUNTER — Other Ambulatory Visit: Payer: Self-pay | Admitting: Internal Medicine

## 2012-02-17 DIAGNOSIS — N6325 Unspecified lump in the left breast, overlapping quadrants: Secondary | ICD-10-CM

## 2012-03-16 ENCOUNTER — Ambulatory Visit (INDEPENDENT_AMBULATORY_CARE_PROVIDER_SITE_OTHER): Payer: Medicare Other | Admitting: Gastroenterology

## 2012-03-16 DIAGNOSIS — E538 Deficiency of other specified B group vitamins: Secondary | ICD-10-CM

## 2012-03-16 MED ORDER — CYANOCOBALAMIN 1000 MCG/ML IJ SOLN
1000.0000 ug | Freq: Once | INTRAMUSCULAR | Status: AC
  Administered 2012-03-16: 1000 ug via INTRAMUSCULAR

## 2012-03-31 ENCOUNTER — Emergency Department (HOSPITAL_COMMUNITY): Payer: Medicare Other

## 2012-03-31 ENCOUNTER — Encounter (HOSPITAL_COMMUNITY): Payer: Self-pay

## 2012-03-31 ENCOUNTER — Observation Stay (HOSPITAL_COMMUNITY)
Admission: EM | Admit: 2012-03-31 | Discharge: 2012-04-01 | Disposition: A | Discharge status: Home / Self Care | Payer: Medicare Other | Attending: Emergency Medicine | Admitting: Emergency Medicine

## 2012-03-31 DIAGNOSIS — E785 Hyperlipidemia, unspecified: Secondary | ICD-10-CM | POA: Insufficient documentation

## 2012-03-31 DIAGNOSIS — K219 Gastro-esophageal reflux disease without esophagitis: Secondary | ICD-10-CM | POA: Insufficient documentation

## 2012-03-31 DIAGNOSIS — I10 Essential (primary) hypertension: Secondary | ICD-10-CM | POA: Insufficient documentation

## 2012-03-31 DIAGNOSIS — M25519 Pain in unspecified shoulder: Secondary | ICD-10-CM | POA: Insufficient documentation

## 2012-03-31 DIAGNOSIS — R079 Chest pain, unspecified: Principal | ICD-10-CM | POA: Insufficient documentation

## 2012-03-31 DIAGNOSIS — F411 Generalized anxiety disorder: Secondary | ICD-10-CM | POA: Insufficient documentation

## 2012-03-31 DIAGNOSIS — Z7902 Long term (current) use of antithrombotics/antiplatelets: Secondary | ICD-10-CM | POA: Insufficient documentation

## 2012-03-31 DIAGNOSIS — Z79899 Other long term (current) drug therapy: Secondary | ICD-10-CM | POA: Insufficient documentation

## 2012-03-31 LAB — CBC
HCT: 39.2 % (ref 36.0–46.0)
Hemoglobin: 13.1 g/dL (ref 12.0–15.0)
MCV: 87.9 fL (ref 78.0–100.0)
Platelets: 154 10*3/uL (ref 150–400)
RBC: 4.46 MIL/uL (ref 3.87–5.11)
WBC: 4.8 10*3/uL (ref 4.0–10.5)

## 2012-03-31 LAB — POCT I-STAT TROPONIN I
Troponin i, poc: 0 ng/mL (ref 0.00–0.08)
Troponin i, poc: 0 ng/mL (ref 0.00–0.08)

## 2012-03-31 LAB — BASIC METABOLIC PANEL
CO2: 25 mEq/L (ref 19–32)
Chloride: 103 mEq/L (ref 96–112)
Creatinine, Ser: 0.91 mg/dL (ref 0.50–1.10)
Glucose, Bld: 92 mg/dL (ref 70–99)

## 2012-03-31 MED ORDER — ASPIRIN 325 MG PO TABS
325.0000 mg | ORAL_TABLET | Freq: Once | ORAL | Status: AC
  Administered 2012-03-31: 325 mg via ORAL
  Filled 2012-03-31: qty 1

## 2012-03-31 MED ORDER — NITROGLYCERIN 0.4 MG SL SUBL: 0.4000 mg | SUBLINGUAL_TABLET | SUBLINGUAL | Status: DC | PRN

## 2012-03-31 MED ORDER — ASPIRIN 325 MG PO TABS: 325.0000 mg | ORAL_TABLET | Freq: Every day | ORAL | Status: DC

## 2012-03-31 MED ORDER — LORAZEPAM 1 MG PO TABS
1.0000 mg | ORAL_TABLET | Freq: Once | ORAL | Status: AC
  Administered 2012-03-31: 1 mg via ORAL
  Filled 2012-03-31: qty 1

## 2012-03-31 MED ORDER — ONDANSETRON 4 MG PO TBDP
4.0000 mg | ORAL_TABLET | Freq: Once | ORAL | Status: AC
  Administered 2012-03-31: 4 mg via ORAL
  Filled 2012-03-31: qty 1

## 2012-03-31 MED ORDER — MORPHINE SULFATE 2 MG/ML IJ SOLN: 2.0000 mg | INTRAMUSCULAR | Status: DC | PRN

## 2012-03-31 NOTE — ED Notes (Signed)
MD at bedside. 

## 2012-03-31 NOTE — ED Notes (Signed)
[name] [name], son of patient, requesting to be updated on changes. Can be reached at the following number: [phone]

## 2012-03-31 NOTE — ED Provider Notes (Signed)
History     CSN: [id number]  Arrival date & time 03/31/12  2148   First MD Initiated Contact with Patient 03/31/12 2149      Chief Complaint  Patient presents with  . Chest Pain    (Consider location/radiation/quality/duration/timing/severity/associated sxs/prior treatment) Patient is a 71 y.o. female presenting with chest pain. The history is provided by the patient.  Chest Pain The chest pain began 3 - 5 hours ago. Chest pain occurs constantly. The chest pain is unchanged. The pain is associated with exertion. At its most intense, the pain is at 7/10. The pain is currently at 5/10. The severity of the pain is moderate. The quality of the pain is described as pressure-like. The pain radiates to the left shoulder.  Pertinent negatives for associated symptoms include no diaphoresis, no orthopnea and no weakness. She tried nothing for the symptoms. Risk factors include being elderly.     Past Medical History  Diagnosis Date  . Anxiety   . GERD (gastroesophageal reflux disease)   . Hyperlipidemia   . Hypertension   . Arthritis   . Chronic kidney disease     stones  . Osteopenia   . Hiatal hernia   . Diverticulosis of colon (without mention of hemorrhage)   . Allergy   . Cancer   . Cataract   . Depression   . ACUTE KIDNEY FAILURE UNSPECIFIED 07/12/2009    Qualifier: Diagnosis of  By: Fabian Sharp MD, Neta Mends     Past Surgical History  Procedure Date  . Hernia repair   . Cholecystectomy   . Total knee arthroplasty     right  . Cystectomy 2001    benign  . Bladder tumor excision 2001  . Ct head limited w/cm 08/03    neg  . Tendon repair 11/2005    right shoulder  . Lumbar disc surgery     x 2  . Cataract extraction     Family History  Problem Relation Age of Onset  . Lung cancer Father     tobacco  . Kidney cancer Son   . Hypertension Brother     mom's side  . Alzheimer's disease Mother   . Osteoporosis Sister   . Breast cancer Cousin   . Colon cancer Neg Hx      History  Substance Use Topics  . Smoking status: Never Smoker   . Smokeless tobacco: Never Used  . Alcohol Use: No    OB History    Grav Para Term Preterm Abortions TAB SAB Ect Mult Living                  Review of Systems  Constitutional: Negative for diaphoresis.  Cardiovascular: Positive for chest pain. Negative for orthopnea.  Neurological: Negative for weakness.  All other systems reviewed and are negative.    Allergies  Lactose intolerance (gi); Meloxicam; and Sulfamethoxazole  Home Medications   Current Outpatient Rx  Name  Route  Sig  Dispense  Refill  . AMLODIPINE BESYLATE 5 MG PO TABS   Oral   Take 2.5 mg by mouth at bedtime.         Marland Kitchen ALIGN PO CAPS   Oral   Take 1 capsule by mouth daily.   14 capsule   0   . METOPROLOL TARTRATE 50 MG PO TABS   Oral   Take 25 mg by mouth 2 (two) times daily.         Marland Kitchen OMEPRAZOLE 20 MG PO CPDR  Oral   Take 20 mg by mouth 2 (two) times daily.         Marland Kitchen ROSUVASTATIN CALCIUM 40 MG PO TABS   Oral   Take 40 mg by mouth at bedtime.         Marland Kitchen VITAMIN D (ERGOCALCIFEROL) 50000 UNITS PO CAPS   Oral   Take 50,000 Units by mouth every 7 (seven) days. Takes on Friday         . ZOLPIDEM TARTRATE 10 MG PO TABS   Oral   Take 5 mg by mouth at bedtime.           BP 140/61  Pulse 60  Temp 98.6 F (37 C) (Oral)  Resp 18  SpO2 98%  Physical Exam  Nursing note and vitals reviewed. Constitutional: She is oriented to person, place, and time. She appears well-developed and well-nourished. No distress.  HENT:  Head: Normocephalic and atraumatic.  Eyes: EOM are normal. Pupils are equal, round, and reactive to light.  Neck: Normal range of motion. Neck supple.  Cardiovascular: Normal rate and regular rhythm.  Exam reveals no friction rub.   No murmur heard. Pulmonary/Chest: Effort normal and breath sounds normal. No respiratory distress. She has no wheezes. She has no rales.  Abdominal: Soft. She exhibits  no distension. There is no tenderness. There is no rebound.  Musculoskeletal: Normal range of motion. She exhibits no edema.  Neurological: She is alert and oriented to person, place, and time.  Skin: She is not diaphoretic.    ED Course  Procedures (including critical care time)  Labs Reviewed  BASIC METABOLIC PANEL - Abnormal; Notable for the following:    GFR calc non Af Amer 62 (*)     GFR calc Af Amer 72 (*)     All other components within normal limits  CBC  POCT I-STAT TROPONIN I   Dg Chest Port 1 View  03/31/2012  *RADIOLOGY REPORT*  Clinical Data: Chest pain.  PORTABLE CHEST - 1 VIEW  Comparison: 11/30/2008  Findings: The heart size and pulmonary vascularity are normal. The lungs appear clear and expanded without focal air space disease or consolidation. No blunting of the costophrenic angles.  No pneumothorax.  Tortuous aorta.  Mediastinal contours appear intact. Degenerative changes in the shoulders.  No significant change since previous study.  IMPRESSION: No evidence of active pulmonary disease.   Original Report Authenticated By: Burman Nieves, M.D.      1. Chest pain     Date: 03/31/2012  Rate: 71  Rhythm: normal sinus rhythm  QRS Axis: normal  Intervals: normal  ST/T Wave abnormalities: normal  Conduction Disutrbances:none  Narrative Interpretation:   Old EKG Reviewed: unchanged    MDM   Patient is a 71 year old female with no prior cardiac history who presents with chest pain. Described as pressure with occasional radiation to the left shoulder. This began when she was moving some books today. Chills reports increased stress over the past week. Patient here in no acute distress. States her pain as a 5/10. She has had history of esophageal spasm, however this is not feeling. Lungs are clear, no peripheral edema, belly is nontender. Heart sounds are normal. Concern for possible ACS. Her initial EKG is without signs of acute ischemia. Her initial troponin is 0.  Aspirin is given. Nitroglycerin given. Will obtain chest x-ray - found to be normal. Will place in CDU chest pain protocol and obtain a CT Heart in the morning.  Elwin Mocha, MD 03/31/12 [phone]

## 2012-03-31 NOTE — ED Provider Notes (Signed)
I saw and evaluated the patient, reviewed the resident's note and I agree with the findings and plan.  Patient here with chest pain that began at rest characterized as pressure without associated shortness of breath or diaphoresis. Symptoms last for possibly 7-8 minutes. EKG is nonacute. She had a chemical stress test 4 years ago which according to her was negative. Patient be placed to the CDU chest pain protocol.  Physical exam: Cardiovascular regular rhythm and rhythm, abdominal exam-soft nontender nondistended, skin warm and dry, neuro-no gross focal deficits  Assessment and plan: As above  Toy Baker, MD 03/31/12 2319

## 2012-03-31 NOTE — ED Notes (Signed)
From home; was moving some books around and started to experience some "chest pressure" and anxiety. Has had feelings of the same and dx with esophogeal spasms. Denies any chest pain or sob. This pressure has been resolving since EMS arrival to her house. Patient also admits to being under some increased stress x 1 week after the death of her stepson. AAOx4.

## 2012-03-31 NOTE — ED Notes (Signed)
X-ray at bedside

## 2012-04-01 ENCOUNTER — Observation Stay (HOSPITAL_COMMUNITY): Payer: Medicare Other

## 2012-04-01 MED ORDER — METOPROLOL TARTRATE 25 MG PO TABS
100.0000 mg | ORAL_TABLET | Freq: Once | ORAL | Status: DC
  Filled 2012-04-01: qty 4

## 2012-04-01 MED ORDER — NITROGLYCERIN 0.4 MG SL SUBL
0.4000 mg | SUBLINGUAL_TABLET | Freq: Once | SUBLINGUAL | Status: AC
  Administered 2012-04-01: 0.4 mg via SUBLINGUAL

## 2012-04-01 MED ORDER — METOPROLOL TARTRATE 25 MG PO TABS
50.0000 mg | ORAL_TABLET | Freq: Once | ORAL | Status: AC
  Administered 2012-04-01: 50 mg via ORAL

## 2012-04-01 MED ORDER — NITROGLYCERIN 0.4 MG SL SUBL
SUBLINGUAL_TABLET | SUBLINGUAL | Status: AC
  Administered 2012-04-01: 0.4 mg via SUBLINGUAL
  Filled 2012-04-01: qty 25

## 2012-04-01 MED ORDER — IOHEXOL 350 MG/ML SOLN
80.0000 mL | Freq: Once | INTRAVENOUS | Status: AC | PRN
  Administered 2012-04-01: 80 mL via INTRAVENOUS

## 2012-04-01 NOTE — ED Provider Notes (Signed)
I saw and evaluated the patient, reviewed the resident's note and I agree with the findings and plan.  Francoise Chojnowski T Tyheem Boughner, MD 04/01/12 1600 

## 2012-04-01 NOTE — ED Notes (Signed)
Gave report to RN in CDU, pt to room with all personal belongings. Pt alert, oriented, and ambulatory.

## 2012-04-01 NOTE — ED Notes (Addendum)
Based on patients ht of 79ft 1 inches and wt of 157: BMI 29.7/ pt pulse is 67 per chest pain protocol pt will receive 50 mg PO lopressor once

## 2012-04-01 NOTE — ED Notes (Signed)
Explained CT protocol to patient, advised of EKG @6am , med adm if applicable @ 5am, NPO @4am  and till test are resulted/ patient understood/ Offered pt sandwich and drink to eat pt accepted.

## 2012-04-01 NOTE — ED Provider Notes (Signed)
Medical screening examination/treatment/procedure(s) were performed by non-physician practitioner and as supervising physician I was immediately available for consultation/collaboration.   Lyanne Co, MD 04/01/12 [phone]

## 2012-04-01 NOTE — ED Provider Notes (Signed)
7:20 AM [name] [name] is a 71 year old female in the CDU on chest pain protocol. She denies any overnight events. She states that she slept well. She denies any symptoms at this time.  PE: Gen: A&O x4 HEENT: PERRL, EOM intact CHEST: RRR, no m/r/g LUNGS: CTAB, no w/r/r ABD: BS x 4, ND/NT EXT: No edema, strong peripheral pulses NEURO: Sensation and strength intact bilaterally  Plan: Cardiac CT this morning, disposition pending results.  11:14 AM Results for orders placed during the hospital encounter of 03/31/12  CBC      Component Value Range   WBC 4.8  4.0 - 10.5 K/uL   RBC 4.46  3.87 - 5.11 MIL/uL   Hemoglobin 13.1  12.0 - 15.0 g/dL   HCT 16.1  09.6 - 04.5 %   MCV 87.9  78.0 - 100.0 fL   MCH 29.4  26.0 - 34.0 pg   MCHC 33.4  30.0 - 36.0 g/dL   RDW 40.9  81.1 - 91.4 %   Platelets 154  150 - 400 K/uL  BASIC METABOLIC PANEL      Component Value Range   Sodium 140  135 - 145 mEq/L   Potassium 4.2  3.5 - 5.1 mEq/L   Chloride 103  96 - 112 mEq/L   CO2 25  19 - 32 mEq/L   Glucose, Bld 92  70 - 99 mg/dL   BUN 22  6 - 23 mg/dL   Creatinine, Ser 7.82  0.50 - 1.10 mg/dL   Calcium 95.6  8.4 - 21.3 mg/dL   GFR calc non Af Amer 62 (*) >90 mL/min   GFR calc Af Amer 72 (*) >90 mL/min  POCT I-STAT TROPONIN I      Component Value Range   Troponin i, poc 0.00  0.00 - 0.08 ng/mL   Comment 3           POCT I-STAT TROPONIN I      Component Value Range   Troponin i, poc 0.00  0.00 - 0.08 ng/mL   Comment 3           POCT I-STAT TROPONIN I      Component Value Range   Troponin i, poc 0.01  0.00 - 0.08 ng/mL   Comment 3            Ct Heart Morp W/cta Cor W/score W/ca W/cm &/or Wo/cm  04/01/2012  **ADDENDUM** CREATED: 04/01/2012 10:35:53  OVER-READ INTERPRETATION - CT CHEST  The following report is an over-read performed by radiologist Dr. Florencia Reasons, M.D. of [facility] on 04/01/2012 10:35:53.  This over-read does not include interpretation of cardiac or coronary  anatomy or pathology.  The CTA interpretation by the cardiologist is attached.  Comparison:  Multiple prior abdominal CT scans, most recently 07/12/2009.  Findings: In the lateral segment of the right middle lobe there is a 4 mm ground-glass attenuation nodule which is nonspecific, but is unchanged compared to the prior study from 07/12/2009. Nodules of this size and appearance are characteristically benign and require no further imaging follow-up.  This recommendation follows the consensus statement: Recommendations for the Management of Subsolid Pulmonary Nodules Detected at CT:  A Statement from the Fleischner Society as published in Radiology 2013; 266:304-317. Within the visualized thorax there are no other larger more suspicious appearing pulmonary nodules or masses, no consolidative airspace disease and no pleural effusions.  Visualized portions of the upper abdomen are unremarkable. There are no aggressive appearing lytic or blastic lesions  noted in the visualized portions of the skeleton.  IMPRESSION: 1.  No acute findings in the visualized thorax.  **END ADDENDUM** SIGNED BY: Florencia Reasons, M.D.   04/01/2012  Cardiac CT:  Indication: Chest Pain  Protocol:  Patient seen in CDU. Received oral beta blockade.  HR in 50's during scan.  Received SL nitro.  100 kV prospective scan with 5% padding centered around 78% R-R interval used.  [employer] 256 scanner with collimation .9mm and gantry rotation speed of 270 msec.  Received 80cc of contrast.  3D data set analyzed on [employer] work-station using VRT, MIP and MPR modes  Findings:  Ascending aorta is normal with no aneurysm or dissection 2.9 cm  Calcium Score:  206 with foci in RCA and LAD  67th percentile for age and sex matched controls  Coronary Arteries:  Right dominant with no anomalies  LM-Normal  LAD- Less than 30% calcific plaque in the proximal and mid vessel. Distal vessel normal        D1: large vessel with less than 30% calcific plaque in mid  vessel  Circumflex- normal        OM1- large vessel normal       AV groove- normal  RCA- large dominate vessel.  Less than 30% calcific plaque in the proximal mid and distal vessel.        PDA- normal        PLB's- one large branch and distal vessel trifurcates and is normal  Noncardiac :  No significant findings on limited soft tissue and lung windows See separate report from Dr Llana Aliment  Impression:     1)    No hemodynamically significant CAD  See body of report above        2)    Calcium score 206 67th percentile for age and sex matched controls 3)    Normal ascending aorta Patient will have aggressive risk factor modification and F/U with Dr Antoine Poche.  Charlton Haws MD [facility]  Original Report Authenticated By: Charlton Haws, M.D.    [facility] 1 View  03/31/2012  *RADIOLOGY REPORT*  Clinical Data: Chest pain.  PORTABLE CHEST - 1 VIEW  Comparison: 11/30/2008  Findings: The heart size and pulmonary vascularity are normal. The lungs appear clear and expanded without focal air space disease or consolidation. No blunting of the costophrenic angles.  No pneumothorax.  Tortuous aorta.  Mediastinal contours appear intact. Degenerative changes in the shoulders.  No significant change since previous study.  IMPRESSION: No evidence of active pulmonary disease.   Original Report Authenticated By: Burman Nieves, M.D.     11:15 AM Course patient's labs have come back negative. Patient is able to be discharged per protocol. Patient is to followup with her primary care doctor as soon as possible. I discussed the results with the patient, she understands and agrees. She is stable and ready for discharge.   Roxy Horseman, PA-C 04/01/12 1116

## 2012-04-13 ENCOUNTER — Ambulatory Visit (INDEPENDENT_AMBULATORY_CARE_PROVIDER_SITE_OTHER): Payer: Medicare Other | Admitting: Gastroenterology

## 2012-04-13 ENCOUNTER — Encounter: Payer: Self-pay | Admitting: Internal Medicine

## 2012-04-13 ENCOUNTER — Ambulatory Visit (INDEPENDENT_AMBULATORY_CARE_PROVIDER_SITE_OTHER): Payer: Medicare Other | Admitting: Internal Medicine

## 2012-04-13 VITALS — BP 140/78 | HR 58 | Temp 98.2°F | Wt 154.0 lb

## 2012-04-13 DIAGNOSIS — R0789 Other chest pain: Secondary | ICD-10-CM

## 2012-04-13 DIAGNOSIS — R931 Abnormal findings on diagnostic imaging of heart and coronary circulation: Secondary | ICD-10-CM | POA: Insufficient documentation

## 2012-04-13 DIAGNOSIS — R5381 Other malaise: Secondary | ICD-10-CM

## 2012-04-13 DIAGNOSIS — M7989 Other specified soft tissue disorders: Secondary | ICD-10-CM

## 2012-04-13 DIAGNOSIS — N62 Hypertrophy of breast: Secondary | ICD-10-CM

## 2012-04-13 DIAGNOSIS — I10 Essential (primary) hypertension: Secondary | ICD-10-CM

## 2012-04-13 DIAGNOSIS — M1990 Unspecified osteoarthritis, unspecified site: Secondary | ICD-10-CM

## 2012-04-13 DIAGNOSIS — E538 Deficiency of other specified B group vitamins: Secondary | ICD-10-CM

## 2012-04-13 DIAGNOSIS — R5383 Other fatigue: Secondary | ICD-10-CM

## 2012-04-13 DIAGNOSIS — R9389 Abnormal findings on diagnostic imaging of other specified body structures: Secondary | ICD-10-CM

## 2012-04-13 MED ORDER — TRIAMTERENE-HCTZ 37.5-25 MG PO TABS: 0.5000 | ORAL_TABLET | Freq: Every day | ORAL | Status: DC

## 2012-04-13 MED ORDER — CYANOCOBALAMIN 1000 MCG/ML IJ SOLN
1000.0000 ug | Freq: Once | INTRAMUSCULAR | Status: AC
  Administered 2012-04-13: 1000 ug via INTRAMUSCULAR

## 2012-04-13 NOTE — Patient Instructions (Addendum)
Advise we have cardiology see you   Because of risk and chest sx  . You did not have a heart attack but have risk of  Cardiac events.  May need to adjust your blood pressure medication  . Your reading is up today at the visit  Would like them to evaluate for cause of chest sx.    ? Need for repeat stress test Your heart calcium score is somewhat elevated.  Your blood  pressure was normal at the last visit .  We can add a low dose diuretic  For  Swelling and BP.   OV with me in one months.  Can tak an extra 1/2 25 metoprolol per day  if needed if bp not coming down  . Make sure pulse is  Over 50  Range.   Consider going back on cymbalta for your pain    But do above first.  We'll arrange for a venous Doppler of her left lower extremity as we discussed.

## 2012-04-13 NOTE — Progress Notes (Signed)
Chief Complaint  Patient presents with  . Follow-up    Hospital.  Seen for chest pain.  Complains of her arthritis.  Would like to know if there is a medication she can take.  Is worries about her kidney function.    HPI: Comes if for f/u hospital   From chest pain .  On December 10. She had some pressure at home got a bit scared her family convinced her as well as the triage nurse to call 911. She was held overnight  Had c xray   troponins were negative.   She also had a coronary artery calcium scan was was 67th percentile for her age score over 200. The reader Dr Eden Emms suggested aggressive treatment and followup with cardiology Dr hochrein whom she had seen a few years ago.. Patient was never told this. Was just told to followup with her primary doctor. She was told that her chest pain was probably from her esophagus but not sure. Getting b12  Shots. Doesn't think it's really helped her fatigue which is ongoing although she feels that her chronic arthritis pain may be the culprit.  Light headed and dizzy  So stopped the gi med except for probiotic     bp has been doing well.   before hospitalization. Was high and hospitalization Arthritis is doing badly  cannot take anti-inflammatories when she's been put on prednisone for the reasons as really helped. She thinks her back and sciatic is doing badly has more of a rib hump and scoliosis wonders what she should do with that. She worries about her kidney function because of her history of acute renal failure on anti-inflammatories. She's not taking any. She has a headache today that feels like a pressure headache gets them occasionally wonders if her blood pressure is high. She also asked about her swelling which tends to be total body swelling however her left lower extremity seems to be more tender than usual. She has a history of some chronic anemia and previous chronic changes in her left lower extremity but no history of DVT just superficial  problem. She has no redness but there some bogginess and tenderness she didn't report this when she was in the hospital . And they didn't ask. ROS: See pertinent positives and negatives per HPI. multip sx as in past no unusual bleeding or bruising.  Past Medical History  Diagnosis Date  . Anxiety   . GERD (gastroesophageal reflux disease)   . Hyperlipidemia   . Hypertension   . Arthritis   . Chronic kidney disease     stones  . Osteopenia   . Hiatal hernia   . Diverticulosis of colon (without mention of hemorrhage)   . Allergy   . Cancer   . Cataract   . Depression   . ACUTE KIDNEY FAILURE UNSPECIFIED 07/12/2009    Qualifier: Diagnosis of  By: Fabian Sharp MD, Neta Mends     Family History  Problem Relation Age of Onset  . Lung cancer Father     tobacco  . Kidney cancer Son   . Hypertension Brother     mom's side  . Alzheimer's disease Mother   . Osteoporosis Sister   . Breast cancer Cousin   . Colon cancer Neg Hx     History   Social History  . Marital Status: Widowed    Spouse Name: N/A    Number of Children: N/A  . Years of Education: N/A   Occupational History  . Retired  Social History Main Topics  . Smoking status: Never Smoker   . Smokeless tobacco: Never Used  . Alcohol Use: No  . Drug Use: No  . Sexually Active: None   Other Topics Concern  . 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 amLODipine (NORVASC) 5 MG tablet Take 2.5 mg by mouth at bedtime.      . bifidobacterium infantis (ALIGN) capsule Take 1 capsule by mouth daily.  14 capsule  0  . cyanocobalamin (,VITAMIN B-12,) 1000 MCG/ML injection Inject 1,000 mcg into the muscle every 30 (thirty) days.      . metoprolol (LOPRESSOR) 50 MG tablet Take 25 mg by mouth 2 (two) times daily.      Marland Kitchen omeprazole (PRILOSEC) 20 MG capsule Take 20 mg by mouth  2 (two) times daily.      . rosuvastatin (CRESTOR) 40 MG tablet Take 40 mg by mouth at bedtime.      . Vitamin D, Ergocalciferol, (DRISDOL) 50000 UNITS CAPS Take 50,000 Units by mouth every 7 (seven) days. Takes on Friday      . zolpidem (AMBIEN) 10 MG tablet Take 5 mg by mouth at bedtime.      . triamterene-hydrochlorothiazide (MAXZIDE-25) 37.5-25 MG per tablet Take 0.5 each (0.5 tablets total) by mouth daily.  30 tablet  3   Facility-Administered Encounter Medications as of 04/13/2012  Medication Dose Route Frequency Provider Last Rate Last Dose  . cyanocobalamin ((VITAMIN B-12)) injection 1,000 mcg  1,000 mcg Intramuscular Q30 days Mardella Layman, MD   1,000 mcg at 02/12/12 1157    EXAM:  BP 140/78  Pulse 58  Temp 98.2 F (36.8 C) (Oral)  Wt 154 lb (69.854 kg)  SpO2 98% Wt Readings from Last 3 Encounters:  04/13/12 154 lb (69.854 kg)  02/12/12 157 lb (71.215 kg)  01/16/12 163 lb (73.936 kg)    There is no height on file to calculate BMI.  GENERAL: vitals reviewed and listed above, alert, oriented, appears well hydrated and in no acute distress Mildly anxious HEENT: atraumatic, conjunctiva  clear, no obvious abnormalities on inspection of external nose and ears OP : no lesion edema or exudate   NECK: no obvious masses on inspection palpation  Has gynecomastia LUNGS: clear to auscultation bilaterally, no wheezes, rales or rhonchi, good air movement  CV: HRRR, no clubbing cyanosis  nl cap refill  left lower extremity has some chronic changes edema left more than right with some mild tenderness over the left calf area but no redness no ulceration.  MS: moves all extremities without noticeable focal  abnormality has osteoarthritis changes Independent gait. PSYCH: pleasant and cooperative, talkative mildly anxious good eye contact doesn't appear depressed.  ASSESSMENT AND PLAN:  Discussed the following assessment and plan:  1. Chest discomfort  Ambulatory referral to  Cardiology   History of same ;has elevated coronary artery calcium score refer for reevaluation  2. HYPERTENSION  Ambulatory referral to Cardiology   Higher today than usual we had decreased her medication because of possible side effects and good control cause of edema can add a low-dose diuretic trial  3. OSTEOARTHRITIS     Unfortunately causing lots of pain can't take anti-inflammatories consider going back on Cymbalta when things are a bit more steady. Couldn't afford it previous  4. Fatigue     Multifactorial continue to address  multiple causes at each visit and monitor.  5. Vitamin B 12 deficiency    6. Leg swelling  US Venous Img Lower Unilateral Left   With pain could be acute on chronic because it is asymmetric we'll get Doppler venous and then fu   7. Gynecomastia     Probably continuing to neck pain axial pain  8. Agatston coronary artery calcium score between 200 and 399  Ambulatory referral to Cardiology   Overall assessment he problems and concerns it takes a good bit of effort focus in on priority problems. At this point the chest discomfort and elevated coronary artery calcium score should be addressed first. Also her blood pressure is more elevated than it has been is possible from change in medication we are adding a low-dose diuretic one other option would be to increase her amlodipine but she has dizziness and lightheadedness and I cannot tell if it's from medication or many of her other problems. Her arthritis is problematic and significant cannot take  Anti-inflammatoriesminimizing use of tramadol. She did do better on Cymbalta in the past may readdress this in the future nti-inflammatories  her leg situation which is bothering her recently may not be significant but because it is now tender and more swollen would get a venous Doppler to rule out more serious problem. -Patient advised to return or notify health care team  immediately if symptoms worsen or persist or new concerns  arise.  Patient Instructions  Advise we have cardiology see you   Because of risk and chest sx  . You did not have a heart attack but have risk of  Cardiac events.  May need to adjust your blood pressure medication  . Your reading is up today at the visit  Would like them to evaluate for cause of chest sx.    ? Need for repeat stress test Your heart calcium score is somewhat elevated.  Your blood  pressure was normal at the last visit .  We can add a low dose diuretic  For  Swelling and BP.   OV with me in one months.  Can tak an extra 1/2 25 metoprolol per day  if needed if bp not coming down  . Make sure pulse is  Over 50  Range.   Consider going back on cymbalta for your pain    But do above first.  We'll arrange for a venous Doppler of her left lower extremity as we discussed.   Neta Mends. Tayli Buch M.D.  Total visit > 50% spent counseling and coordinating care

## 2012-04-14 ENCOUNTER — Other Ambulatory Visit: Payer: Self-pay | Admitting: Internal Medicine

## 2012-04-14 DIAGNOSIS — M7989 Other specified soft tissue disorders: Secondary | ICD-10-CM

## 2012-04-17 ENCOUNTER — Encounter (INDEPENDENT_AMBULATORY_CARE_PROVIDER_SITE_OTHER): Payer: Medicare Other

## 2012-04-17 DIAGNOSIS — M7989 Other specified soft tissue disorders: Secondary | ICD-10-CM

## 2012-04-17 DIAGNOSIS — M79609 Pain in unspecified limb: Secondary | ICD-10-CM

## 2012-04-27 ENCOUNTER — Other Ambulatory Visit: Payer: Self-pay | Admitting: Family Medicine

## 2012-04-27 DIAGNOSIS — M7120 Synovial cyst of popliteal space [Baker], unspecified knee: Secondary | ICD-10-CM

## 2012-05-13 ENCOUNTER — Other Ambulatory Visit: Payer: Self-pay | Admitting: Internal Medicine

## 2012-05-14 ENCOUNTER — Telehealth: Payer: Self-pay | Admitting: *Deleted

## 2012-05-14 ENCOUNTER — Ambulatory Visit (INDEPENDENT_AMBULATORY_CARE_PROVIDER_SITE_OTHER): Payer: Medicare Other | Admitting: Gastroenterology

## 2012-05-14 DIAGNOSIS — E538 Deficiency of other specified B group vitamins: Secondary | ICD-10-CM

## 2012-05-14 NOTE — Telephone Encounter (Signed)
Pt asked to speak with a nurse after her B12 Injection today. She reports pain in both sides usually when she has a BM that usually last a couple of minutes. Recently she has had 2 attacks while she was out not associated with a BM and the pain last >75minutes.  Today she reports being tender in both areas.  She reports IBS and her chart states she has IBS with alternating diarrhea and constipation. Pt was given an appt with Dr Jarold Motto on 05/21/12. She will call if s&s worsen.

## 2012-05-15 ENCOUNTER — Ambulatory Visit (INDEPENDENT_AMBULATORY_CARE_PROVIDER_SITE_OTHER): Payer: Medicare Other | Admitting: Cardiology

## 2012-05-15 ENCOUNTER — Encounter: Payer: Self-pay | Admitting: Cardiology

## 2012-05-15 VITALS — BP 122/76 | HR 64 | Ht 62.0 in | Wt 158.0 lb

## 2012-05-15 DIAGNOSIS — R0989 Other specified symptoms and signs involving the circulatory and respiratory systems: Secondary | ICD-10-CM

## 2012-05-15 NOTE — Patient Instructions (Addendum)

## 2012-05-15 NOTE — Progress Notes (Signed)
HPI  The patient was in the emergency room in early December with chest pain. I reviewed these records. She had no objective evidence of ischemia. She did have a cardiac CT. This demonstrated a calcium score of 206 with foci in RCA and LAD 67th percentile for age and sex matched controls.  Coronary Arteries: Right dominant with no anomalies LM-Normal LAD- Less than 30% calcific plaque in the proximal and mid vessel. Distal vessel normal D1: large vessel with less than 30% calcific plaque in mid vessel Circumflex- normal OM1- large vessel normal AV groove- normal RCA- large dominate vessel. Less than 30% calcific plaque in the proximal mid and distal vessel.   Since that time she has continued to have some almost constant chest heaviness. She wonders if this could be related to just back problems and her breasts. She's also had a hiatal hernia. She's having a lot of burping. She's very limited in her activity because of joint pains and muscle aches. She also has chronic fatigue. She has fibromyalgia. She doesn't sleep well. She actually has many somatic complaints. She doesn't sound like she's having any more of the severe substernal discomfort that prompted her ER visit. This was an isolated episode. She's not describing any new jaw or arm discomfort consistent with angina. She's not describing PND or orthopnea.  Allergies  Allergen Reactions  . Lactose Intolerance (Gi) Other (See Comments)    Gi upset  . Meloxicam     REACTION: acute renal failure  with hypovolemic trigger Hospital  3 2011  . Sulfamethoxazole Hives    Current Outpatient Prescriptions  Medication Sig Dispense Refill  . amLODipine (NORVASC) 5 MG tablet Take 2.5 mg by mouth at bedtime.      . bifidobacterium infantis (ALIGN) capsule Take 1 capsule by mouth daily.  14 capsule  0  . cyanocobalamin (,VITAMIN B-12,) 1000 MCG/ML injection Inject 1,000 mcg into the muscle every 30 (thirty) days.      . metoprolol (LOPRESSOR) 50 MG  tablet Take 25 mg by mouth 2 (two) times daily.      Marland Kitchen omeprazole (PRILOSEC) 20 MG capsule Take 20 mg by mouth 2 (two) times daily.      . rosuvastatin (CRESTOR) 40 MG tablet Take 40 mg by mouth at bedtime.      . Vitamin D, Ergocalciferol, (DRISDOL) 50000 UNITS CAPS Take 50,000 Units by mouth every 7 (seven) days. Takes on Friday      . zolpidem (AMBIEN) 10 MG tablet Take 5 mg by mouth at bedtime.         Past Medical History  Diagnosis Date  . Anxiety   . GERD (gastroesophageal reflux disease)   . Hyperlipidemia   . Hypertension   . Arthritis   . Chronic kidney disease     stones  . Osteopenia   . Hiatal hernia   . Diverticulosis of colon (without mention of hemorrhage)   . Allergy   . Cancer     Basal cell  . Cataract   . Depression   . ACUTE KIDNEY FAILURE UNSPECIFIED 07/12/2009  . CAD (coronary artery disease)     Mild nonobstructive by CT    Past Surgical History  Procedure Date  . Cholecystectomy   . Total knee arthroplasty     right  . Cystectomy 2001    benign  . Bladder tumor excision 2001  . Tendon repair 11/2005    right shoulder  . Lumbar disc surgery     x  2  . Cataract extraction     Family History  Problem Relation Age of Onset  . Lung cancer Father     tobacco  . Kidney cancer Son   . Hypertension Brother     mom's side  . Alzheimer's disease Mother   . Osteoporosis Sister   . Breast cancer Cousin   . Colon cancer Neg Hx     History   Social History  . Marital Status: Widowed    Spouse Name: N/A    Number of Children: 2  . Years of Education: N/A   Occupational History  . Retired     Social History Main Topics  . Smoking status: Never Smoker   . Smokeless tobacco: Never Used  . Alcohol Use: No  . Drug Use: No  . Sexually Active: Not on file   Other Topics Concern  . 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 Otherwise as stated in the HPI and negative for all other systems.  PHYSICAL EXAM BP 122/76  Pulse 64  Ht 5\' 2"  (1.575 m)  Wt 158 lb (71.668 kg)  BMI 28.90 kg/m2 GENERAL:  Well appearing HEENT:  Pupils equal round and reactive, fundi not visualized, oral mucosa unremarkable NECK:  No jugular venous distention, waveform within normal limits, carotid upstroke brisk and symmetric, soft bruits, no thyromegaly LYMPHATICS:  No cervical, inguinal adenopathy LUNGS:  Clear to auscultation bilaterally BACK:  No CVA tenderness CHEST:  Unremarkable HEART:  PMI not displaced or sustained,S1 and S2 within normal limits, no S3, no S4, no clicks, no rubs, no murmurs ABD:  Flat, positive bowel sounds normal in frequency in pitch, no bruits, no rebound, no guarding, no midline pulsatile mass, no hepatomegaly, no splenomegaly EXT:  2 plus pulses throughout, no edema, no cyanosis no clubbing SKIN:  No rashes no nodules NEURO:  Cranial nerves II through XII grossly intact, motor grossly intact throughout [facility]:  Cognitively intact, oriented to person place and time  EKG:  04/02/11 normal sinus rhythm, no acute ST-T wave changes, poor anterior R wave progression.   ASSESSMENT AND PLAN  CAD - She does have some mild nonobstructive coronary disease. She will continue with risk reduction. No further imaging is indicated.  Bruit - The patient has a slight carotid bruit. I will order carotid Doppler.  Chest pain  - This was atypical. She had evaluation as above. No change in therapy is indicated. This does not appear to be cardiac in etiology.   Dyslipidemia - She is on an excellent dose of Crestor. Her lipid profile the summer was fantastic. She will continue the meds as listed.

## 2012-05-18 ENCOUNTER — Ambulatory Visit (INDEPENDENT_AMBULATORY_CARE_PROVIDER_SITE_OTHER): Payer: Medicare Other | Admitting: Internal Medicine

## 2012-05-18 ENCOUNTER — Encounter: Payer: Self-pay | Admitting: Internal Medicine

## 2012-05-18 VITALS — BP 146/80 | HR 73 | Temp 97.4°F | Wt 158.0 lb

## 2012-05-18 DIAGNOSIS — D518 Other vitamin B12 deficiency anemias: Secondary | ICD-10-CM

## 2012-05-18 DIAGNOSIS — R931 Abnormal findings on diagnostic imaging of heart and coronary circulation: Secondary | ICD-10-CM

## 2012-05-18 DIAGNOSIS — R9389 Abnormal findings on diagnostic imaging of other specified body structures: Secondary | ICD-10-CM

## 2012-05-18 DIAGNOSIS — R0789 Other chest pain: Secondary | ICD-10-CM

## 2012-05-18 DIAGNOSIS — J029 Acute pharyngitis, unspecified: Secondary | ICD-10-CM

## 2012-05-18 DIAGNOSIS — E785 Hyperlipidemia, unspecified: Secondary | ICD-10-CM

## 2012-05-18 DIAGNOSIS — E559 Vitamin D deficiency, unspecified: Secondary | ICD-10-CM

## 2012-05-18 DIAGNOSIS — E538 Deficiency of other specified B group vitamins: Secondary | ICD-10-CM

## 2012-05-18 DIAGNOSIS — I10 Essential (primary) hypertension: Secondary | ICD-10-CM

## 2012-05-18 DIAGNOSIS — N62 Hypertrophy of breast: Secondary | ICD-10-CM

## 2012-05-18 NOTE — Progress Notes (Signed)
Chief Complaint  Patient presents with  . Follow-up    Comes in complaining of a sore throat ongoing for 3 weeks.    HPI: Patient comes in today for follow up of  multiple medical problems.  Since last visit she has been seen by Dr Caprice Red in cardiology for her atypical cp not felt to be cardiac  He ntoed a mi;d carotid bruit  bp at that visit was about 122 range .   BP decrease  Med norvasc 2.5  Since x mas  Metoprolol  Bid no change  Not checking readings   Still has some  sore throat .    mre on right getting better with gargling   Has appt with GI dr Jarold Motto end of month about change in bowel  habits  ? IBS.   Upper abd soreness episodes no blood in stool has HH also   Continues b12 injections  St see dr Thomasena Edis for djd in right shoulder   On vit d weekly now ROS: See pertinent positives and negatives per HPI. No vomiting dizziness some better  ged on prilosec  Past Medical History  Diagnosis Date  . Anxiety   . GERD (gastroesophageal reflux disease)   . Hyperlipidemia   . Hypertension   . Arthritis   . Chronic kidney disease     stones  . Osteopenia   . Hiatal hernia   . Diverticulosis of colon (without mention of hemorrhage)   . Allergy   . Cancer     Basal cell  . Cataract   . Depression   . ACUTE KIDNEY FAILURE UNSPECIFIED 07/12/2009  . CAD (coronary artery disease)     Mild nonobstructive by CT    Family History  Problem Relation Age of Onset  . Lung cancer Father     tobacco  . Kidney cancer Son   . Hypertension Brother     mom's side  . Alzheimer's disease Mother   . Osteoporosis Sister   . Breast cancer Cousin   . Colon cancer Neg Hx     History   Social History  . Marital Status: Widowed    Spouse Name: N/A    Number of Children: 2  . Years of Education: N/A   Occupational History  . Retired     Social History Main Topics  . Smoking status: Never Smoker   . Smokeless tobacco: Never Used  . Alcohol Use: No  . Drug Use: No  .  Sexually Active: None   Other Topics Concern  . 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 amLODipine (NORVASC) 5 MG tablet Take 2.5 mg by mouth at bedtime.      . bifidobacterium infantis (ALIGN) capsule Take 1 capsule by mouth daily.  14 capsule  0  . cyanocobalamin (,VITAMIN B-12,) 1000 MCG/ML injection Inject 1,000 mcg into the muscle every 30 (thirty) days.      . metoprolol (LOPRESSOR) 50 MG tablet Take 25 mg by mouth 2 (two) times daily.      Marland Kitchen omeprazole (PRILOSEC) 20 MG capsule Take 20 mg by mouth 2 (two) times daily.      . rosuvastatin (CRESTOR) 40 MG tablet Take 40 mg by mouth at bedtime.      . traMADol (ULTRAM) 50 MG tablet Take 50 mg by mouth. Taking one at night.      Marland Kitchen  Vitamin D, Ergocalciferol, (DRISDOL) 50000 UNITS CAPS Take 50,000 Units by mouth every 7 (seven) days. Takes on Friday      . zolpidem (AMBIEN) 10 MG tablet Take 5 mg by mouth at bedtime.      . [DISCONTINUED] traMADol (ULTRAM) 50 MG tablet Take 50 mg by mouth every 6 (six) hours as needed. For pain       Facility-Administered Encounter Medications as of 05/18/2012  Medication Dose Route Frequency Provider Last Rate Last Dose  . cyanocobalamin ((VITAMIN B-12)) injection 1,000 mcg  1,000 mcg Intramuscular Q30 days Mardella Layman, MD   1,000 mcg at 05/14/12 1143    EXAM:  BP 146/80  Pulse 73  Temp 97.4 F (36.3 C)  Wt 158 lb (71.668 kg)  SpO2 96%  There is no height on file to calculate BMI.  GENERAL: vitals reviewed and listed above, alert, oriented, appears well hydrated and in no acute distress  HEENT: atraumatic, conjunctiva  clear, no obvious abnormalities on inspection of external nose and ears OP : no lesion edema or exudate  midly red pink   NECK: no obvious masses on inspection palpation  i dont hear bruit heard by dr Rexene Edison  LUNGS: clear  to auscultation bilaterally, no wheezes, rales or rhonchi, good air movement  CV: HRRR, no clubbing cyanosis or  peripheral edema nl cap refill  abd soft  [place] mid epigastrum no masses  MS: moves all extremities without noticeable focal  Abnormality  OA changes without  redness  PSYCH: pleasant and cooperative, no obvious depression talkative .   ASSESSMENT AND PLAN:  Discussed the following assessment and plan:  1. HYPERTENSION    normal last week did decrease norvASC hasnt take meds this am follow and fu if still up.  2. HYPERLIPIDEMIA   3. Throat soreness    right getting  better  poss environmental  follow  no obv sinusitis at this time.   4. Agatston coronary artery calcium score between 200 and 399    consider asa 81 mg per day  if dr Otis Dials not a sig gi risk ,  5. ANEMIA-B12 DEFICIENCY   6. Chest tightness or pressure   7. VITAMIN D DEFICIENCY    dec to otc after a month and then check level want to get her off igh dose med   8. Vitamin B 12 deficiency    on injections  last level july 13 and normal  9. Gynecomastia    i thibk adds to her UPPEr body discomfort s as we had addressed in the past    -Patient advised to return or notify health care team  if symptoms worsen or persist or new concerns arise.  Patient Instructions  Check BP readings   At least 3 x per week for the next 2 weeks   If BP is still elevated we may adjust your medication again.    Your blood pressure readings were normal   At cardiologist office .   Gargle for the throat .  For now contact us if  persistent or progressive .  In regard to vitamin D   Take weekly for another month . THen  Change  To 2000 iu per day .     Labs vit d level bmp pth in 3 months . Consider asa 81 mg per day for risk reduction but wait until see Dr Jarold Motto and to decide risk benefit          Neta Mends. Jakwon Gayton  M.D.

## 2012-05-18 NOTE — Patient Instructions (Addendum)
Check BP readings   At least 3 x per week for the next 2 weeks   If BP is still elevated we may adjust your medication again.    Your blood pressure readings were normal   At cardiologist office .   Gargle for the throat .  For now contact us if  persistent or progressive .  In regard to vitamin D   Take weekly for another month . THen  Change  To 2000 iu per day .     Labs vit d level bmp pth in 3 months . Consider asa 81 mg per day for risk reduction but wait until see Dr Jarold Motto and to decide risk benefit

## 2012-05-19 ENCOUNTER — Encounter (INDEPENDENT_AMBULATORY_CARE_PROVIDER_SITE_OTHER): Payer: Medicare Other

## 2012-05-19 DIAGNOSIS — R42 Dizziness and giddiness: Secondary | ICD-10-CM

## 2012-05-19 DIAGNOSIS — R0989 Other specified symptoms and signs involving the circulatory and respiratory systems: Secondary | ICD-10-CM

## 2012-05-19 DIAGNOSIS — I6529 Occlusion and stenosis of unspecified carotid artery: Secondary | ICD-10-CM

## 2012-05-21 ENCOUNTER — Ambulatory Visit: Payer: Medicare Other | Admitting: Gastroenterology

## 2012-05-24 ENCOUNTER — Other Ambulatory Visit: Payer: Self-pay | Admitting: Internal Medicine

## 2012-05-25 ENCOUNTER — Telehealth: Payer: Self-pay | Admitting: Cardiology

## 2012-05-25 NOTE — Telephone Encounter (Signed)
New problem    Returning call back to nurse.   

## 2012-05-25 NOTE — Telephone Encounter (Signed)
Carotid doppler results were given to pt.

## 2012-05-26 ENCOUNTER — Ambulatory Visit (INDEPENDENT_AMBULATORY_CARE_PROVIDER_SITE_OTHER): Payer: Medicare Other | Admitting: Gastroenterology

## 2012-05-26 ENCOUNTER — Encounter: Payer: Self-pay | Admitting: Gastroenterology

## 2012-05-26 VITALS — BP 130/70 | HR 58 | Ht 62.0 in | Wt 159.0 lb

## 2012-05-26 DIAGNOSIS — Z9089 Acquired absence of other organs: Secondary | ICD-10-CM

## 2012-05-26 DIAGNOSIS — K5730 Diverticulosis of large intestine without perforation or abscess without bleeding: Secondary | ICD-10-CM

## 2012-05-26 DIAGNOSIS — R0789 Other chest pain: Secondary | ICD-10-CM

## 2012-05-26 DIAGNOSIS — R072 Precordial pain: Secondary | ICD-10-CM

## 2012-05-26 DIAGNOSIS — K589 Irritable bowel syndrome without diarrhea: Secondary | ICD-10-CM

## 2012-05-26 DIAGNOSIS — K219 Gastro-esophageal reflux disease without esophagitis: Secondary | ICD-10-CM

## 2012-05-26 DIAGNOSIS — K449 Diaphragmatic hernia without obstruction or gangrene: Secondary | ICD-10-CM

## 2012-05-26 DIAGNOSIS — Z9049 Acquired absence of other specified parts of digestive tract: Secondary | ICD-10-CM

## 2012-05-26 MED ORDER — HYOSCYAMINE SULFATE 0.125 MG SL SUBL: 0.1250 mg | SUBLINGUAL_TABLET | SUBLINGUAL | Status: DC | PRN

## 2012-05-26 NOTE — Progress Notes (Signed)
This is a somewhat complicated 72 year old Caucasian female with chronic GERD.  Nevertheless for several months she's had constant pressure in her substernal area which is made worse by eating but not associated to dysphagia..  She's had a hospitalization in DECEMBER for cardiac evaluation which was unremarkable.  She is status post cholecystectomy.  She's had previous endoscopies which have shown a large hiatal hernia,, and she currently is on Prilosec 20 mg twice a day with minimal regurgitation and no burning substernal pain.  She denies dysphagia for solids or liquids.  She also complains of periodic spasmodic left lower quadrant pain with gas and bloating, but has rather regular bowel movements without melena or hematochezia.  She is on B12 parenteral replacement therapy, when necessary Ultram, and daily Norvasc.  She denies any hepatobiliary complaints, anorexia, weight loss, or ENT problems otherwise.CT scan of the abdomen in August of 2010 showed a duodenal diverticulum and diverticulosis.  There is no significant biliary ductal dilatation or pancreatic abnormalities noted.  Current Medications, Allergies, Past Medical History, Past Surgical History, Family History and Social History were reviewed in [employer] record.  ROS: All systems were reviewed and are negative unless otherwise stated in the HPI..she does complain of multiple orthopedic pains in her shoulders, and knees.  She has various appointments for evaluation of orthopedic problems.          Physical Exam:healthy-appearing patient in no acute distress appears stated age.  Blood pressure 130/70, pulse 50 and regular, and weight 159.  97% oxygen saturation.  I cannot appreciate stigmata of chronic liver disease.  Examination oral pharyngeal area is unremarkable.  Her chest is clear and she appear to be in a regular rhythm without murmurs gallops or rubs.  Her abdomen shows no organomegaly, masses, tenderness, and  bowel sounds are normal.  Mental status is normal and peripheral extremities are unremarkable.    Assessment and Plan:probable enlarging hiatal hernia, rule out paraesophageal hernia.  I've scheduled her for barium swallow-upper GI series.  I've given her some when necessary sublingual Levsin for her spasmodic colon spasms associated with her diverticulosis, prescribed a high fiber diet with daily Metamucil, and reviewed antireflux regime with her.  She may benefit from esophageal manometry.  Workup she may be a good candidate for laparoscopic Nissen fundoplication. Encounter Diagnosis  Name Primary?  . Hiatal hernia Yes

## 2012-05-26 NOTE — Patient Instructions (Signed)
Please purchase Metamucil over the counter and take as directed.  We have sent the following medications to your pharmacy for you to pick up at your convenience: Levsin  You have been scheduled for a Barium Esophogram at [facility] (1st floor of the hospital) on Friday 05/29/12 at 10:30 am. Please arrive 15 minutes prior to your appointment for registration. Make certain not to have anything to eat or drink 6 hours prior to your test. If you need to reschedule for any reason, please contact radiology at [phone] to do so. __________________________________________________________________ A barium swallow is an examination that concentrates on views of the esophagus. This tends to be a double contrast exam (barium and two liquids which, when combined, create a gas to distend the wall of the oesophagus) or single contrast (non-ionic iodine based). The study is usually tailored to your symptoms so a good history is essential. Attention is paid during the study to the form, structure and configuration of the esophagus, looking for functional disorders (such as aspiration, dysphagia, achalasia, motility and reflux) EXAMINATION You may be asked to change into a gown, depending on the type of swallow being performed. A radiologist and radiographer will perform the procedure. The radiologist will advise you of the type of contrast selected for your procedure and direct you during the exam. You will be asked to stand, sit or lie in several different positions and to hold a small amount of fluid in your mouth before being asked to swallow while the imaging is performed .In some instances you may be asked to swallow barium coated marshmallows to assess the motility of a solid food bolus. The exam can be recorded as a digital or video fluoroscopy procedure. POST PROCEDURE It will take 1-2 days for the barium to pass through your system. To facilitate this, it is important, unless otherwise directed, to  increase your fluids for the next 24-48hrs and to resume your normal diet.  This test typically takes about 30 minutes to perform. __________________________________________________________________________________   Please follow a high fiber diet.  High-Fiber Diet Fiber is found in fruits, vegetables, and grains. A high-fiber diet encourages the addition of more whole grains, legumes, fruits, and vegetables in your diet. The recommended amount of fiber for adult males is 38 g per day. For adult females, it is 25 g per day. Pregnant and lactating women should get 28 g of fiber per day. If you have a digestive or bowel problem, ask your caregiver for advice before adding high-fiber foods to your diet. Eat a variety of high-fiber foods instead of only a select few type of foods.  PURPOSE  To increase stool bulk.  To make bowel movements more regular to prevent constipation.  To lower cholesterol.  To prevent overeating. WHEN IS THIS DIET USED?  It may be used if you have constipation and hemorrhoids.  It may be used if you have uncomplicated diverticulosis (intestine condition) and irritable bowel syndrome.  It may be used if you need help with weight management.  It may be used if you want to add it to your diet as a protective measure against atherosclerosis, diabetes, and cancer. SOURCES OF FIBER  Whole-grain breads and cereals.  Fruits, such as apples, oranges, bananas, berries, prunes, and pears.  Vegetables, such as green peas, carrots, sweet potatoes, beets, broccoli, cabbage, spinach, and artichokes.  Legumes, such split peas, soy, lentils.  Almonds. FIBER CONTENT IN FOODS Starches and Grains / Dietary Fiber (g)  Cheerios, 1 cup /  3 g  Corn Flakes cereal, 1 cup / 0.7 g  Rice crispy treat cereal, 1 cup / 0.3 g  Instant oatmeal (cooked),  cup / 2 g  Frosted wheat cereal, 1 cup / 5.1 g  Brown, long-grain rice (cooked), 1 cup / 3.5 g  White, long-grain rice  (cooked), 1 cup / 0.6 g  Enriched macaroni (cooked), 1 cup / 2.5 g Legumes / Dietary Fiber (g)  Baked beans (canned, plain, or vegetarian),  cup / 5.2 g  Kidney beans (canned),  cup / 6.8 g  Pinto beans (cooked),  cup / 5.5 g Breads and Crackers / Dietary Fiber (g)  Plain or honey graham crackers, 2 squares / 0.7 g  Saltine crackers, 3 squares / 0.3 g  Plain, salted pretzels, 10 pieces / 1.8 g  Whole-wheat bread, 1 slice / 1.9 g  White bread, 1 slice / 0.7 g  Raisin bread, 1 slice / 1.2 g  Plain bagel, 3 oz / 2 g  Flour tortilla, 1 oz / 0.9 g  Corn tortilla, 1 small / 1.5 g  Hamburger or hotdog bun, 1 small / 0.9 g Fruits / Dietary Fiber (g)  Apple with skin, 1 medium / 4.4 g  Sweetened applesauce,  cup / 1.5 g  Banana,  medium / 1.5 g  Grapes, 10 grapes / 0.4 g  Orange, 1 small / 2.3 g  Raisin, 1.5 oz / 1.6 g  Melon, 1 cup / 1.4 g Vegetables / Dietary Fiber (g)  Green beans (canned),  cup / 1.3 g  Carrots (cooked),  cup / 2.3 g  Broccoli (cooked),  cup / 2.8 g  Peas (cooked),  cup / 4.4 g  Mashed potatoes,  cup / 1.6 g  Lettuce, 1 cup / 0.5 g  Corn (canned),  cup / 1.6 g  Tomato,  cup / 1.1 g Document Released: 04/08/2005 Document Revised: 10/08/2011 Document Reviewed: 07/11/2011 [facility] Patient Information 2013 [place], [place].  CC: Dr Berniece Andreas

## 2012-05-29 ENCOUNTER — Ambulatory Visit (HOSPITAL_COMMUNITY)
Admission: RE | Admit: 2012-05-29 | Discharge: 2012-05-29 | Disposition: A | Discharge status: Home / Self Care | Payer: Medicare Other | Source: Ambulatory Visit | Attending: Gastroenterology | Admitting: Gastroenterology

## 2012-05-29 ENCOUNTER — Telehealth: Payer: Self-pay | Admitting: *Deleted

## 2012-05-29 DIAGNOSIS — K449 Diaphragmatic hernia without obstruction or gangrene: Secondary | ICD-10-CM

## 2012-05-29 DIAGNOSIS — R1110 Vomiting, unspecified: Secondary | ICD-10-CM | POA: Insufficient documentation

## 2012-05-29 DIAGNOSIS — K589 Irritable bowel syndrome without diarrhea: Secondary | ICD-10-CM | POA: Insufficient documentation

## 2012-05-29 DIAGNOSIS — R1310 Dysphagia, unspecified: Secondary | ICD-10-CM | POA: Insufficient documentation

## 2012-05-29 NOTE — Telephone Encounter (Signed)
lmom for pt to call back

## 2012-05-29 NOTE — Telephone Encounter (Signed)
Message copied by Florene Glen on Fri May 29, 2012  5:49 PM ------      Message from: PATTERSON, DAVID R      Created: Fri May 29, 2012 12:15 PM       No evidence of paraesophageal hernia.  She needs esophageal manometry to exclude a primary GI motility disorder.  She should continue her twice a day PPI therapy and when necessary Levsin.  Our review her radiographs further once they are posted.

## 2012-06-09 ENCOUNTER — Other Ambulatory Visit: Payer: Self-pay | Admitting: Internal Medicine

## 2012-06-10 ENCOUNTER — Other Ambulatory Visit: Payer: Self-pay | Admitting: Family Medicine

## 2012-07-02 ENCOUNTER — Other Ambulatory Visit: Payer: Self-pay | Admitting: *Deleted

## 2012-07-02 NOTE — Telephone Encounter (Signed)
Pt finally called back stating she has had problems with her shoulder and hip and may need surgery. She apologized. Informed her of the need for an EM and she stated understanding. Will mail her instructions.

## 2012-07-13 ENCOUNTER — Encounter (HOSPITAL_COMMUNITY)
Admission: RE | Disposition: A | Discharge status: Hospice | Payer: Self-pay | Source: Ambulatory Visit | Attending: Gastroenterology

## 2012-07-13 ENCOUNTER — Telehealth: Payer: Self-pay | Admitting: *Deleted

## 2012-07-13 ENCOUNTER — Ambulatory Visit (HOSPITAL_COMMUNITY)
Admission: RE | Admit: 2012-07-13 | Discharge: 2012-07-13 | Disposition: A | Discharge status: Hospice | Payer: Medicare Other | Source: Ambulatory Visit | Attending: Gastroenterology | Admitting: Gastroenterology

## 2012-07-13 DIAGNOSIS — R1310 Dysphagia, unspecified: Secondary | ICD-10-CM | POA: Insufficient documentation

## 2012-07-13 DIAGNOSIS — R933 Abnormal findings on diagnostic imaging of other parts of digestive tract: Secondary | ICD-10-CM

## 2012-07-13 HISTORY — PX: ESOPHAGEAL MANOMETRY: SHX5429

## 2012-07-13 SURGERY — MANOMETRY, ESOPHAGUS

## 2012-07-13 SURGERY — MANOMETRY, ESOPHAGUS
Anesthesia: Topical

## 2012-07-13 MED ORDER — LIDOCAINE VISCOUS 2 % MT SOLN
OROMUCOSAL | Status: AC
  Filled 2012-07-13: qty 15

## 2012-07-13 NOTE — Telephone Encounter (Signed)
Pt was a walk in after her EM. Mainly we just talked for >30 minutes. She spoke of lower abdominal pain on the l and r sides, mainly the left. She only has these every month or so, but this one lasted over 3 hours. The area was tender to the touch and she had a BM prior to the attack and then afterwards. She states it may be d/t her IBS, but when she discusses the EM with Dr Jarold Motto, she will bring this up.

## 2012-07-14 ENCOUNTER — Encounter (HOSPITAL_COMMUNITY): Payer: Self-pay | Admitting: Gastroenterology

## 2012-07-15 ENCOUNTER — Telehealth: Payer: Self-pay | Admitting: *Deleted

## 2012-07-15 NOTE — Telephone Encounter (Signed)
Informed pt her EM did not show anything other that reflux; there were no blockages. She should remain on her PPI and she can f/u if she has any questions. Pt stated understanding and will f/u on 07/21/12.

## 2012-07-21 ENCOUNTER — Encounter: Payer: Self-pay | Admitting: Gastroenterology

## 2012-07-21 ENCOUNTER — Ambulatory Visit (INDEPENDENT_AMBULATORY_CARE_PROVIDER_SITE_OTHER): Payer: Medicare Other | Admitting: Gastroenterology

## 2012-07-21 ENCOUNTER — Telehealth: Payer: Self-pay | Admitting: Gastroenterology

## 2012-07-21 ENCOUNTER — Other Ambulatory Visit (INDEPENDENT_AMBULATORY_CARE_PROVIDER_SITE_OTHER): Payer: Medicare Other

## 2012-07-21 VITALS — BP 128/80 | HR 71 | Ht 62.0 in | Wt 154.0 lb

## 2012-07-21 DIAGNOSIS — K5730 Diverticulosis of large intestine without perforation or abscess without bleeding: Secondary | ICD-10-CM

## 2012-07-21 DIAGNOSIS — R1032 Left lower quadrant pain: Secondary | ICD-10-CM

## 2012-07-21 DIAGNOSIS — K219 Gastro-esophageal reflux disease without esophagitis: Secondary | ICD-10-CM

## 2012-07-21 LAB — CBC WITH DIFFERENTIAL/PLATELET
Basophils Absolute: 0 10*3/uL (ref 0.0–0.1)
Eosinophils Relative: 0.7 % (ref 0.0–5.0)
HCT: 43.9 % (ref 36.0–46.0)
Hemoglobin: 14.6 g/dL (ref 12.0–15.0)
Lymphocytes Relative: 15 % (ref 12.0–46.0)
Lymphs Abs: 0.9 10*3/uL (ref 0.7–4.0)
Monocytes Relative: 5.7 % (ref 3.0–12.0)
Platelets: 166 10*3/uL (ref 150.0–400.0)
RDW: 15.1 % — ABNORMAL HIGH (ref 11.5–14.6)
WBC: 5.9 10*3/uL (ref 4.5–10.5)

## 2012-07-21 LAB — C-REACTIVE PROTEIN: CRP: <0.5 mg/dL (ref 0.5–20.0)

## 2012-07-21 MED ORDER — CIPROFLOXACIN HCL 500 MG PO TABS: ORAL_TABLET | ORAL | Status: DC

## 2012-07-21 NOTE — Patient Instructions (Addendum)
Cipro 500 mg was sent to your pharmacy, please take one tablet by mouth twice daily for a week.  Your physician has requested that you go to the basement for the following lab work before leaving today: CRP and CBC.  Diverticulosis information is below. ______________________________________________________________________________________________________________    Diverticulosis Diverticulosis is a common condition that develops when small pouches (diverticula) form in the wall of the colon. The risk of diverticulosis increases with age. It happens more often in people who eat a low-fiber diet. Most individuals with diverticulosis have no symptoms. Those individuals with symptoms usually experience abdominal pain, constipation, or loose stools (diarrhea). HOME CARE INSTRUCTIONS   Increase the amount of fiber in your diet as directed by your caregiver or dietician. This may reduce symptoms of diverticulosis.  Your caregiver may recommend taking a dietary fiber supplement.  Drink at least 6 to 8 glasses of water each day to prevent constipation.  Try not to strain when you have a bowel movement.  Your caregiver may recommend avoiding nuts and seeds to prevent complications, although this is still an uncertain benefit.  Only take over-the-counter or prescription medicines for pain, discomfort, or fever as directed by your caregiver. FOODS WITH HIGH FIBER CONTENT INCLUDE:  Fruits. Apple, peach, pear, tangerine, raisins, prunes.  Vegetables. Brussels sprouts, asparagus, broccoli, cabbage, carrot, cauliflower, romaine lettuce, spinach, summer squash, tomato, winter squash, zucchini.  Starchy Vegetables. Baked beans, kidney beans, lima beans, split peas, lentils, potatoes (with skin).  Grains. Whole wheat bread, brown rice, bran flake cereal, plain oatmeal, white rice, shredded wheat, bran muffins. SEEK IMMEDIATE MEDICAL CARE IF:   You develop increasing pain or severe bloating.  You  have an oral temperature above 102 F (38.9 C), not controlled by medicine.  You develop vomiting or bowel movements that are bloody or black. Document Released: 01/04/2004 Document Revised: 07/01/2011 Document Reviewed: 09/06/2009 ExitCare Patient Information 2013 ExitCare, [place]. ________________________________________________________________________________________________________________________________________________________________________________________________                                               We are excited to introduce MyChart, a new best-in-class service that provides you online access to important information in your electronic medical record. We want to make it easier for you to view your health information - all in one secure location - when and where you need it. We expect MyChart will enhance the quality of care and service we provide.  When you register for MyChart, you can:    View your test results.    Request appointments and receive appointment reminders via email.    Request medication renewals.    View your medical history, allergies, medications and immunizations.    Communicate with your physician's office through a password-protected site.    Conveniently print information such as your medication lists.  To find out if MyChart is right for you, please talk to a member of our clinical staff today. We will gladly answer your questions about this free health and wellness tool.  If you are age 72 or older and want a member of your family to have access to your record, you must provide written consent by completing a proxy form available at our office. Please speak to our clinical staff about guidelines regarding accounts for patients younger than age 72.  As you activate your MyChart account and need any technical assistance, please call  the MyChart technical support line at [phone]) or email your question to  [email or web address]. If you email your question(s), please include your name, a return phone number and the best time to reach you.  If you have non-urgent health-related questions, you can send a message to our office through MyChart at [email or web address]. If you have a medical emergency, call 911.  Thank you for using MyChart as your new health and wellness resource!   MyChart licensed from [employer],  [id number]. Patents Pending.

## 2012-07-21 NOTE — Progress Notes (Signed)
This is a 72 year old Caucasian female I am evaluating for acid reflux.  Recent endoscopy, barium swallow, an esophageal high-resolution manometry has confirmed mild lower esophageal sphincter incompetency, mild delayed esophageal emptying, and symptoms consistent with GERD unresponsive to Prilosec 20 mg twice a day.  She now complains of one week of left lower quadrant discomfort with" bubble spasms" in her left lower quadrant.  This has not been a complaint in the past.  She has Levsin which he uses on a when necessary basis.  There is no history of fever, chills, melena, hematochezia, and she is up-to-date on her colonoscopy which in the past has shown moderate diverticulosis.  She denies upper GI or hepatobiliary complaints this time, anorexia, weight loss, or other systemic complaints.  Current Medications, Allergies, Past Medical History, Past Surgical History, Family History and Social History were reviewed in [employer] record.  ROS: All systems were reviewed and are negative unless otherwise stated in the HPI.          Physical Exam: Very healthy nonseptic-appearing patient in no distress.  Blood pressure 128/80, pulse 71, and weight 154 with a BMI of 28.16.  Abdominal exam shows no organomegaly, masses, nodes slight tenderness in the left lower quadrant to deep palpation.  There is no rebound tenderness, and bowel sounds are normal.  Mental status is normal.     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  She has resolving mild subacute diverticulitis.  I placed her on Cipro 500 mg twice a day for one week and encouraged her to continue a high-fiber diet and liberal by mouth fluids as tolerated.  CBC and CRP were ordered.   You have Encounter Diagnosis  Name Primary?  . Diverticulosis of colon without hemorrhage Yes

## 2012-07-21 NOTE — Telephone Encounter (Signed)
I agree.Marland KitchenNo. Cipro,use prn levsin for pain q 8h.Marland Kitchen

## 2012-07-21 NOTE — Telephone Encounter (Signed)
What else can we give patient

## 2012-07-21 NOTE — Telephone Encounter (Addendum)
Dr Jarold Motto, please advise. Pharmacy calling about possible interaction between Cipro and Zanaflex. Thanks,

## 2012-07-22 NOTE — Telephone Encounter (Signed)
I called patient to give her information on what Dr. Jarold Motto said.  Patient stated that the pharmacist called [place] before talking to her. Patient stated that she has not started the muscle relaxer Zanaflex yet and wanted to know if she can take Cipro.  Per Duke Salvia RN patient can take Cipro since she did not start Zanaflex and cannot take Zanaflex until she is completely done with Cipro.  Advised patient to wait 48 hrs after finishing Cipro to take Zanaflex, that way it has time to clear her system.  Patient verbalized understanding.

## 2012-07-22 NOTE — Addendum Note (Signed)
Addended by: Ok Anis A on: 07/22/2012 01:16 PM   Modules accepted: Medications

## 2012-08-10 ENCOUNTER — Other Ambulatory Visit (INDEPENDENT_AMBULATORY_CARE_PROVIDER_SITE_OTHER): Payer: Medicare Other

## 2012-08-10 DIAGNOSIS — E349 Endocrine disorder, unspecified: Secondary | ICD-10-CM

## 2012-08-10 DIAGNOSIS — E785 Hyperlipidemia, unspecified: Secondary | ICD-10-CM

## 2012-08-10 DIAGNOSIS — E559 Vitamin D deficiency, unspecified: Secondary | ICD-10-CM

## 2012-08-10 LAB — BASIC METABOLIC PANEL
Calcium: 9.3 mg/dL (ref 8.4–10.5)
GFR: 70.92 mL/min (ref >60.00)
Glucose, Bld: 100 mg/dL — ABNORMAL HIGH (ref 70–99)
Potassium: 3.8 mEq/L (ref 3.5–5.1)
Sodium: 141 mEq/L (ref 135–145)

## 2012-08-11 LAB — VITAMIN D 25 HYDROXY (VIT D DEFICIENCY, FRACTURES): Vit D, 25-Hydroxy: 35 ng/mL (ref 30–89)

## 2012-08-12 LAB — PTH, INTACT AND CALCIUM: Calcium, Total (PTH): 9.6 mg/dL (ref 8.4–10.5)

## 2012-08-17 ENCOUNTER — Encounter: Payer: Self-pay | Admitting: Internal Medicine

## 2012-08-17 ENCOUNTER — Ambulatory Visit (INDEPENDENT_AMBULATORY_CARE_PROVIDER_SITE_OTHER): Payer: Medicare Other | Admitting: Internal Medicine

## 2012-08-17 VITALS — BP 126/74 | HR 60 | Temp 97.9°F | Wt 154.0 lb

## 2012-08-17 DIAGNOSIS — M1990 Unspecified osteoarthritis, unspecified site: Secondary | ICD-10-CM

## 2012-08-17 DIAGNOSIS — Z01818 Encounter for other preprocedural examination: Secondary | ICD-10-CM | POA: Insufficient documentation

## 2012-08-17 DIAGNOSIS — I10 Essential (primary) hypertension: Secondary | ICD-10-CM

## 2012-08-17 DIAGNOSIS — E559 Vitamin D deficiency, unspecified: Secondary | ICD-10-CM

## 2012-08-17 DIAGNOSIS — R931 Abnormal findings on diagnostic imaging of heart and coronary circulation: Secondary | ICD-10-CM

## 2012-08-17 DIAGNOSIS — D518 Other vitamin B12 deficiency anemias: Secondary | ICD-10-CM

## 2012-08-17 DIAGNOSIS — R9389 Abnormal findings on diagnostic imaging of other specified body structures: Secondary | ICD-10-CM

## 2012-08-17 DIAGNOSIS — E785 Hyperlipidemia, unspecified: Secondary | ICD-10-CM

## 2012-08-17 NOTE — Patient Instructions (Addendum)
Take vitamin 1000 - 2000 iu per day.  Can continue on the 20 mg of crestor for now and we can see the labs in the summer.   And then make  Decision about other .    Blood pressure is good today .      Plan for wellness visit in the summer July and we can do labs pre visit   Vit D    Lipids lfts, ,  Bmp AND  pth    TSH , cbcdiff   B12 level.  Dx: elevated PTH  Level  Vit D deficiency.    b12 deficiency

## 2012-08-17 NOTE — Progress Notes (Signed)
Chief Complaint  Patient presents with  . Follow-up    multiple issues    HPI: Patient comes in today for follow up of  multiple medical problems.  Last visit  See below   Patient Instructions   Check BP readings At least 3 x per week for the next 2 weeks If BP is still elevated we may adjust your medication again. Your blood pressure readings were normal At cardiologist office .  Gargle for the throat . For now contact us if persistent or progressive .  In regard to vitamin D Take weekly for another month . THen Change To 2000 iu per day .  Labs vit d level bmp pth in 3 months .  Consider asa 81 mg per day for risk reduction but wait until see Dr Jarold Motto and to decide risk benefit  Since last visit she states that her stomach problem is much better on probiotics.  Not taking     Vit d otc .    As advised missinterpreted but she has finished her high-dose vitamin D  Actually taking 20 mg crestor instead of 40    .     When was in [place] . He does of cost issues wonders if that's too high of a dose and if you would be okay to stay on the 20 Also needs  Surgery for   The left knee  And evenetulaluy shoulder has a form with an open ended gait for her knee surgery replacement she states her scoliosis bothering her and her back walks different because of her right knee has to hobble but no falling  She also has some cervical spine disease and lumbar spine treated recently with injectable steroid into the left paraspinal musculature and muscle relaxants by Dr. Ethelene Hal  She is taking oral vitamin B12 because of the nationwide shortage of injectable.   ROS: See pertinent positives and negatives per HPI. No current new chest pain shortness of breath syncope no new vision change. Had an itchy rash on the mid back asked to look there still itches at times Past Medical History  Diagnosis Date  . Anxiety   . GERD (gastroesophageal reflux disease)   . Hyperlipidemia   . Hypertension   .  Arthritis   . Chronic kidney disease     stones  . Osteopenia   . Hiatal hernia   . Diverticulosis of colon (without mention of hemorrhage)   . Allergy   . Cancer     Basal cell  . Cataract   . Depression   . ACUTE KIDNEY FAILURE UNSPECIFIED 07/12/2009  . CAD (coronary artery disease)     Mild nonobstructive by CT    Family History  Problem Relation Age of Onset  . Lung cancer Father     tobacco  . Kidney cancer Son   . Hypertension Brother     mom's side  . Alzheimer's disease Mother   . Osteoporosis Sister   . Breast cancer Cousin   . Colon cancer Neg Hx   . Heart disease Maternal Uncle     x 4  . Heart disease Maternal Aunt     x 4  . Diabetes Paternal Grandmother   . Diabetes Paternal Uncle     x 2    History   Social History  . Marital Status: Widowed    Spouse Name: N/A    Number of Children: 2  . Years of Education: N/A   Occupational History  .  Retired     Social History Main Topics  . Smoking status: Never Smoker   . Smokeless tobacco: Never Used  . Alcohol Use: Yes     Comment: rare  . Drug Use: No  . Sexually Active: None   Other Topics Concern  . 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 amLODipine (NORVASC) 5 MG tablet Take 2.5 mg by mouth at bedtime.      . Cyanocobalamin (B-12 PO) Take by mouth.      . hyoscyamine (LEVSIN/SL) 0.125 MG SL tablet Place 1 tablet (0.125 mg total) under the tongue every 4 (four) hours as needed.  30 tablet  0  . metoprolol (LOPRESSOR) 50 MG tablet TAKE 1/2 TABLET BY MOUTH TWICE A DAY  30 tablet  2  . omeprazole (PRILOSEC) 20 MG capsule Take 20 mg by mouth 2 (two) times daily.      . rosuvastatin (CRESTOR) 40 MG tablet Take 40 mg by mouth at bedtime.      Marland Kitchen tiZANidine (ZANAFLEX) 4 MG tablet       . traMADol (ULTRAM) 50 MG tablet Take 50 mg by  mouth. Taking one at night.      . zolpidem (AMBIEN) 10 MG tablet TAKE 1 TABLET AT BEDTIME AS NEEDED  30 tablet  0  . cyanocobalamin (,VITAMIN B-12,) 1000 MCG/ML injection Inject 1,000 mcg into the muscle every 30 (thirty) days.      . [DISCONTINUED] ciprofloxacin (CIPRO) 500 MG tablet Please take one tablet by mouth twice daily for a week.  14 tablet  0   No facility-administered encounter medications on file as of 08/17/2012.    EXAM:  BP 126/74  Pulse 60  Temp(Src) 97.9 F (36.6 C) (Oral)  Wt 154 lb (69.854 kg)  BMI 28.16 kg/m2  SpO2 97%  Body mass index is 28.16 kg/(m^2).  GENERAL: vitals reviewed and listed above, alert, oriented, appears well hydrated and in no acute distress talkative looks well nontoxic  HEENT: atraumatic, conjunctiva  clear, no obvious abnormalities on inspection of external nose and ears OP : no lesion edema or exudate   NECK: no obvious masses on inspection palpation   LUNGS: clear to auscultation bilaterally, no wheezes, rales or rhonchi, good air movement  CV: HRRR, no gallops or murmurs are heard no clubbing cyanosis or  peripheral edema nl cap refill   MS: moves all extremities  DJD changes limps favoring the right knee has a scoliosis  PSYCH: pleasant and cooperative, no obvious depression or anxiety Lab Results  Component Value Date   WBC 5.9 07/21/2012   HGB 14.6 07/21/2012   HCT 43.9 07/21/2012   PLT 166.0 07/21/2012   GLUCOSE 100* 08/10/2012   CHOL 157 11/14/2011   TRIG 81.0 11/14/2011   HDL 75.90 11/14/2011   LDLDIRECT 179.1 10/09/2007   LDLCALC 65 11/14/2011   ALT 14 11/14/2011   AST 20 11/14/2011   NA 141 08/10/2012   K 3.8 08/10/2012   CL 106 08/10/2012   CREATININE 0.8 08/10/2012   BUN 18 08/10/2012   CO2 29 08/10/2012   TSH 2.32 11/14/2011   HGBA1C 5.7 08/09/2009   Vit d 35 better  pth 78 and calcium 9.8 ASSESSMENT AND PLAN:  Discussed the following assessment and plan:  VITAMIN D DEFICIENCY - Supplement with OTC thousand  to 2000 and day  check level at next visit  OSTEOARTHRITIS - Fairly deforming involving hands and spine and knees  HYPERTENSION - Controlled  HYPERLIPIDEMIA - Reasonable to try 20 mg of Crestor take half of a 40 check at next labs  Agatston coronary artery calcium score between 200 and 399 - She has noncritical coronary disease but does have risk on statin medicine. Okay to try 20 mg check levels  ANEMIA-B12 DEFICIENCY - On orals we'll need levels checked  Pre-operative clearance - stable no significant contraindications to surgery  knee;date has not been set ;form is signed Form signed for preoperative clearance her knee surgery. -Patient advised to return or notify health care team  if symptoms worsen or persist or new concerns arise.  Patient Instructions  Take vitamin 1000 - 2000 iu per day.  Can continue on the 20 mg of crestor for now and we can see the labs in the summer.   And then make  Decision about other .    Blood pressure is good today .      Plan for wellness visit in the summer July and we can do labs pre visit   Vit D    Lipids lfts, ,  Bmp AND  pth    TSH , cbcdiff   B12 level.  Dx: elevated PTH  Level  Vit D deficiency.    b12 deficiency     Neta Mends. Panosh M.D.

## 2012-09-16 ENCOUNTER — Other Ambulatory Visit: Payer: Self-pay | Admitting: Internal Medicine

## 2012-09-16 NOTE — Telephone Encounter (Signed)
Ambien last filled on 06/09/12 #30 with 0 additional refills Last seen on 08/17/12 No follow up scheduled Please advise.  Thanks!!

## 2012-09-18 ENCOUNTER — Telehealth: Payer: Self-pay | Admitting: Family Medicine

## 2012-09-18 NOTE — Telephone Encounter (Signed)
This patient needs a CPE per [facility] in Aug.  Please use a Tues or Wednesday if needed.  Please contact the pt for this appointment.  Thanks!!

## 2012-09-18 NOTE — Telephone Encounter (Signed)
Can refill  X 1   Needs her wellness  And labs before further refills   She is due for  Wellness  In July but can do this in august  Since she didn't maker her appt   And labs pre visit ( please see  Last instruction sheet for labs )

## 2012-09-22 NOTE — Telephone Encounter (Signed)
lmom for pt to callback to sch °

## 2012-09-28 NOTE — Telephone Encounter (Signed)
Pt is sch for 12-15-12 815am

## 2012-10-22 ENCOUNTER — Other Ambulatory Visit: Payer: Self-pay | Admitting: Internal Medicine

## 2012-11-05 ENCOUNTER — Other Ambulatory Visit: Payer: Self-pay | Admitting: Internal Medicine

## 2012-11-05 NOTE — Telephone Encounter (Signed)
Last filled on 09/16/12 #30 with 0 additional refills Has future appointment on 12/15/12 Last seen on 08/17/12 Please advise. Thanks!

## 2012-11-06 NOTE — Telephone Encounter (Signed)
Ok to refill x 1  

## 2012-12-15 ENCOUNTER — Encounter: Payer: Medicare Other | Admitting: Internal Medicine

## 2012-12-17 ENCOUNTER — Other Ambulatory Visit: Payer: Self-pay | Admitting: Family Medicine

## 2012-12-21 ENCOUNTER — Other Ambulatory Visit: Payer: Self-pay | Admitting: Internal Medicine

## 2012-12-22 ENCOUNTER — Other Ambulatory Visit: Payer: Self-pay | Admitting: Internal Medicine

## 2012-12-22 NOTE — Telephone Encounter (Signed)
Ambien last filled on 11/05/12 #30 with 0 additional refills Last seen on 08/17/12 No upcoming appt Please advise Thanks!!

## 2012-12-25 ENCOUNTER — Telehealth: Payer: Self-pay | Admitting: Family Medicine

## 2012-12-25 NOTE — Telephone Encounter (Signed)
Yearly visit was cancelled for last week  Have her rescheduleed at least an ov. Before refills run out.  Ok to  refill metoprolol x 2  ambien x 1 .  Change sig to 1/2 to 1 as needed  10 mg

## 2012-12-25 NOTE — Telephone Encounter (Signed)
Per [facility], patient needs to come in for an appt.  Please call the patient and make appt.  Thanks!!

## 2012-12-30 NOTE — Telephone Encounter (Signed)
Lmom

## 2012-12-31 ENCOUNTER — Other Ambulatory Visit: Payer: Self-pay | Admitting: Neurological Surgery

## 2012-12-31 DIAGNOSIS — M4120 Other idiopathic scoliosis, site unspecified: Secondary | ICD-10-CM

## 2013-01-07 ENCOUNTER — Ambulatory Visit
Admission: RE | Admit: 2013-01-07 | Discharge: 2013-01-07 | Disposition: A | Discharge status: Home / Self Care | Payer: Medicare Other | Source: Ambulatory Visit | Attending: Neurological Surgery | Admitting: Neurological Surgery

## 2013-01-07 DIAGNOSIS — M4120 Other idiopathic scoliosis, site unspecified: Secondary | ICD-10-CM

## 2013-01-18 ENCOUNTER — Telehealth: Payer: Self-pay | Admitting: Family Medicine

## 2013-01-18 ENCOUNTER — Other Ambulatory Visit: Payer: Self-pay | Admitting: Internal Medicine

## 2013-01-18 NOTE — Telephone Encounter (Signed)
This patient needs to be scheduled for a Medicare Wellness visit.  Please schedule with the patient.  Thanks!!

## 2013-01-18 NOTE — Telephone Encounter (Signed)
Pt states that she is having severe problems with her back, and she is seeing Dr. Danielle Dess. She is then going for a second opinion in regards to a 7 hour back surgery. She states that she is going to wait until they finalize decisions about her surgery, to call and schedule a cpx/surgical clearance. FYI.

## 2013-02-02 ENCOUNTER — Other Ambulatory Visit: Payer: Self-pay | Admitting: Orthopaedic Surgery

## 2013-02-02 DIAGNOSIS — M419 Scoliosis, unspecified: Secondary | ICD-10-CM

## 2013-02-03 ENCOUNTER — Other Ambulatory Visit: Payer: Self-pay | Admitting: Orthopaedic Surgery

## 2013-02-05 ENCOUNTER — Other Ambulatory Visit: Payer: Self-pay | Admitting: Orthopaedic Surgery

## 2013-02-05 DIAGNOSIS — M818 Other osteoporosis without current pathological fracture: Secondary | ICD-10-CM

## 2013-02-12 ENCOUNTER — Telehealth: Payer: Self-pay | Admitting: Internal Medicine

## 2013-02-12 NOTE — Telephone Encounter (Signed)
Due to having a lump in her left breast, pt needs referral for a diagnostic mammogram.  It was time for her mammogram anyway, pt received notice. Can you  put order in?

## 2013-02-15 ENCOUNTER — Other Ambulatory Visit: Payer: Self-pay | Admitting: Family Medicine

## 2013-02-15 DIAGNOSIS — Z1239 Encounter for other screening for malignant neoplasm of breast: Secondary | ICD-10-CM

## 2013-02-15 NOTE — Telephone Encounter (Signed)
Order placed in the system. 

## 2013-02-16 NOTE — Telephone Encounter (Signed)
Pt aware.

## 2013-02-17 ENCOUNTER — Ambulatory Visit
Admission: RE | Admit: 2013-02-17 | Discharge: 2013-02-17 | Disposition: A | Discharge status: Home / Self Care | Payer: Medicare Other | Source: Ambulatory Visit | Attending: Orthopaedic Surgery | Admitting: Orthopaedic Surgery

## 2013-02-17 DIAGNOSIS — M419 Scoliosis, unspecified: Secondary | ICD-10-CM

## 2013-02-18 ENCOUNTER — Telehealth: Payer: Self-pay | Admitting: Internal Medicine

## 2013-02-18 NOTE — Telephone Encounter (Signed)
Pt states she had lump in her left breast. Pt called [place] and needed 3 D diagnostic mammogram. But that is not the order that was put in. Breast center states they needs the correct order to schedule.  Pls advise.

## 2013-02-19 ENCOUNTER — Other Ambulatory Visit: Payer: Self-pay | Admitting: Family Medicine

## 2013-02-19 DIAGNOSIS — Z1239 Encounter for other screening for malignant neoplasm of breast: Secondary | ICD-10-CM

## 2013-02-19 NOTE — Telephone Encounter (Signed)
Order placed in the system. 

## 2013-02-22 ENCOUNTER — Other Ambulatory Visit: Payer: Self-pay | Admitting: Internal Medicine

## 2013-02-22 DIAGNOSIS — N63 Unspecified lump in unspecified breast: Secondary | ICD-10-CM

## 2013-02-23 ENCOUNTER — Other Ambulatory Visit: Payer: Self-pay | Admitting: Family Medicine

## 2013-02-23 ENCOUNTER — Ambulatory Visit
Admission: RE | Admit: 2013-02-23 | Discharge: 2013-02-23 | Disposition: A | Discharge status: Home / Self Care | Payer: Medicare Other | Source: Ambulatory Visit | Attending: Orthopaedic Surgery | Admitting: Orthopaedic Surgery

## 2013-02-23 DIAGNOSIS — M818 Other osteoporosis without current pathological fracture: Secondary | ICD-10-CM

## 2013-02-23 DIAGNOSIS — N6320 Unspecified lump in the left breast, unspecified quadrant: Secondary | ICD-10-CM

## 2013-02-24 ENCOUNTER — Other Ambulatory Visit: Payer: Self-pay | Admitting: Internal Medicine

## 2013-03-04 ENCOUNTER — Other Ambulatory Visit: Payer: Self-pay | Admitting: Internal Medicine

## 2013-03-05 NOTE — Telephone Encounter (Signed)
Last filled on 12/25/2012 #30 with 0 additional refills Pt has no future appointment scheduled. Last seen in fu visit on 08/17/12. Please advise. Thanks!

## 2013-03-06 NOTE — Telephone Encounter (Signed)
Refill  Amlodipine for 3 months   Ambien disp# 14 no further refills without office visit .

## 2013-03-08 ENCOUNTER — Other Ambulatory Visit: Payer: Self-pay | Admitting: Internal Medicine

## 2013-03-12 ENCOUNTER — Ambulatory Visit: Payer: Self-pay

## 2013-03-15 ENCOUNTER — Ambulatory Visit
Admission: RE | Admit: 2013-03-15 | Discharge: 2013-03-15 | Disposition: A | Discharge status: Home / Self Care | Payer: Medicare Other | Source: Ambulatory Visit | Attending: Internal Medicine | Admitting: Internal Medicine

## 2013-03-15 DIAGNOSIS — N6320 Unspecified lump in the left breast, unspecified quadrant: Secondary | ICD-10-CM

## 2013-03-22 ENCOUNTER — Other Ambulatory Visit: Payer: Self-pay | Admitting: Internal Medicine

## 2013-03-26 ENCOUNTER — Ambulatory Visit: Payer: Self-pay

## 2013-04-02 ENCOUNTER — Telehealth: Payer: Self-pay | Admitting: Internal Medicine

## 2013-04-02 MED ORDER — METOPROLOL TARTRATE 50 MG PO TABS: ORAL_TABLET | ORAL | Status: DC

## 2013-04-02 NOTE — Telephone Encounter (Addendum)
Pt has appt sch for 04-23-2013. Pt needs a refill on  Metoprolol call into [facility] [place]

## 2013-04-07 ENCOUNTER — Ambulatory Visit: Payer: Self-pay

## 2013-04-20 ENCOUNTER — Other Ambulatory Visit: Payer: Self-pay | Admitting: Internal Medicine

## 2013-04-23 ENCOUNTER — Ambulatory Visit: Payer: Medicare Other | Admitting: Internal Medicine

## 2013-04-26 ENCOUNTER — Other Ambulatory Visit (INDEPENDENT_AMBULATORY_CARE_PROVIDER_SITE_OTHER): Payer: Medicare Other

## 2013-04-26 DIAGNOSIS — E785 Hyperlipidemia, unspecified: Secondary | ICD-10-CM

## 2013-04-26 DIAGNOSIS — I10 Essential (primary) hypertension: Secondary | ICD-10-CM

## 2013-04-26 DIAGNOSIS — D509 Iron deficiency anemia, unspecified: Secondary | ICD-10-CM

## 2013-04-26 DIAGNOSIS — E039 Hypothyroidism, unspecified: Secondary | ICD-10-CM

## 2013-04-26 LAB — LIPID PANEL
CHOL/HDL RATIO: 2
Cholesterol: 160 mg/dL (ref 0–200)
HDL: 71 mg/dL (ref >39.00)
LDL CALC: 67 mg/dL (ref 0–99)
TRIGLYCERIDES: 110 mg/dL (ref 0.0–149.0)
VLDL: 22 mg/dL (ref 0.0–40.0)

## 2013-04-26 LAB — CBC WITH DIFFERENTIAL/PLATELET
BASOS ABS: 0 10*3/uL (ref 0.0–0.1)
Basophils Relative: 0.8 % (ref 0.0–3.0)
Eosinophils Absolute: 0.1 10*3/uL (ref 0.0–0.7)
Eosinophils Relative: 2.8 % (ref 0.0–5.0)
HCT: 40.8 % (ref 36.0–46.0)
Hemoglobin: 13.7 g/dL (ref 12.0–15.0)
LYMPHS PCT: 26.4 % (ref 12.0–46.0)
Lymphs Abs: 1.3 10*3/uL (ref 0.7–4.0)
MCHC: 33.5 g/dL (ref 30.0–36.0)
MCV: 88.4 fl (ref 78.0–100.0)
MONO ABS: 0.3 10*3/uL (ref 0.1–1.0)
Monocytes Relative: 7.2 % (ref 3.0–12.0)
NEUTROS PCT: 62.8 % (ref 43.0–77.0)
Neutro Abs: 3 10*3/uL (ref 1.4–7.7)
PLATELETS: 167 10*3/uL (ref 150.0–400.0)
RBC: 4.62 Mil/uL (ref 3.87–5.11)
RDW: 14 % (ref 11.5–14.6)
WBC: 4.8 10*3/uL (ref 4.5–10.5)

## 2013-04-26 LAB — HEPATIC FUNCTION PANEL
ALK PHOS: 52 U/L (ref 39–117)
ALT: 14 U/L (ref 0–35)
AST: 20 U/L (ref 0–37)
Albumin: 4.1 g/dL (ref 3.5–5.2)
BILIRUBIN DIRECT: 0.1 mg/dL (ref 0.0–0.3)
BILIRUBIN TOTAL: 0.7 mg/dL (ref 0.3–1.2)
Total Protein: 7 g/dL (ref 6.0–8.3)

## 2013-04-26 LAB — BASIC METABOLIC PANEL
BUN: 18 mg/dL (ref 6–23)
CALCIUM: 9.6 mg/dL (ref 8.4–10.5)
CO2: 25 meq/L (ref 19–32)
CREATININE: 1 mg/dL (ref 0.4–1.2)
Chloride: 107 mEq/L (ref 96–112)
GFR: 56.57 mL/min — ABNORMAL LOW (ref >60.00)
GLUCOSE: 121 mg/dL — AB (ref 70–99)
Potassium: 3.8 mEq/L (ref 3.5–5.1)
Sodium: 140 mEq/L (ref 135–145)

## 2013-04-26 LAB — TSH: TSH: 3.09 u[IU]/mL (ref 0.35–5.50)

## 2013-04-27 ENCOUNTER — Ambulatory Visit: Payer: Medicare Other

## 2013-04-27 DIAGNOSIS — R739 Hyperglycemia, unspecified: Secondary | ICD-10-CM

## 2013-04-27 LAB — HEMOGLOBIN A1C: Hgb A1c MFr Bld: 5.7 % (ref 4.6–6.5)

## 2013-05-01 ENCOUNTER — Other Ambulatory Visit: Payer: Self-pay | Admitting: Internal Medicine

## 2013-05-03 ENCOUNTER — Encounter: Payer: Self-pay | Admitting: Internal Medicine

## 2013-05-03 ENCOUNTER — Encounter: Payer: Medicare Other | Admitting: Internal Medicine

## 2013-05-03 NOTE — Progress Notes (Signed)
Document opened and reviewed for cpx  but appt  canceled same day .apparently having gi sx

## 2013-05-26 ENCOUNTER — Encounter: Payer: Self-pay | Admitting: Internal Medicine

## 2013-06-05 ENCOUNTER — Other Ambulatory Visit: Payer: Self-pay | Admitting: Internal Medicine

## 2013-06-16 ENCOUNTER — Ambulatory Visit (INDEPENDENT_AMBULATORY_CARE_PROVIDER_SITE_OTHER): Payer: Medicare Other | Admitting: Internal Medicine

## 2013-06-16 ENCOUNTER — Encounter: Payer: Self-pay | Admitting: Internal Medicine

## 2013-06-16 VITALS — BP 116/74 | HR 69 | Temp 98.0°F | Ht 61.5 in | Wt 171.0 lb

## 2013-06-16 DIAGNOSIS — Z23 Encounter for immunization: Secondary | ICD-10-CM

## 2013-06-16 DIAGNOSIS — F4323 Adjustment disorder with mixed anxiety and depressed mood: Secondary | ICD-10-CM

## 2013-06-16 DIAGNOSIS — R931 Abnormal findings on diagnostic imaging of heart and coronary circulation: Secondary | ICD-10-CM

## 2013-06-16 DIAGNOSIS — R739 Hyperglycemia, unspecified: Secondary | ICD-10-CM

## 2013-06-16 DIAGNOSIS — R9389 Abnormal findings on diagnostic imaging of other specified body structures: Secondary | ICD-10-CM

## 2013-06-16 DIAGNOSIS — Z0000 Encounter for general adult medical examination without abnormal findings: Secondary | ICD-10-CM

## 2013-06-16 DIAGNOSIS — M1990 Unspecified osteoarthritis, unspecified site: Secondary | ICD-10-CM

## 2013-06-16 DIAGNOSIS — E785 Hyperlipidemia, unspecified: Secondary | ICD-10-CM

## 2013-06-16 DIAGNOSIS — E538 Deficiency of other specified B group vitamins: Secondary | ICD-10-CM

## 2013-06-16 DIAGNOSIS — M549 Dorsalgia, unspecified: Secondary | ICD-10-CM

## 2013-06-16 DIAGNOSIS — I10 Essential (primary) hypertension: Secondary | ICD-10-CM

## 2013-06-16 MED ORDER — ROSUVASTATIN CALCIUM 40 MG PO TABS: ORAL_TABLET | ORAL | Status: DC

## 2013-06-16 MED ORDER — AMLODIPINE BESYLATE 5 MG PO TABS
5.0000 mg | ORAL_TABLET | Freq: Every day | ORAL | Status: DC
Start: 2013-06-16 — End: 2013-07-03

## 2013-06-16 MED ORDER — DULOXETINE HCL 30 MG PO CPEP: 30.0000 mg | ORAL_CAPSULE | Freq: Every day | ORAL | Status: DC

## 2013-06-16 MED ORDER — METOPROLOL TARTRATE 50 MG PO TABS: ORAL_TABLET | ORAL | Status: DC

## 2013-06-16 NOTE — Progress Notes (Signed)
Chief Complaint  Patient presents with  . Medicare Wellness    HPI: Patient comes in today for Preventive Medicare wellness visit . And disease management  Prev visit was delayed for other care related to her ms disease arthritis .   Had breast cyst that was drained and okl now   ? cymbalta .  Was on in past and got better pain relief and anxiety control but went off cause of cost  When branded   Tramadol and tiazadine per dr Patrice Paradise.  getting second opinions  About back plans on doing knee replacement first   Cholesterol taking  1/2 pill Crestor as per cards   Sleep an issue  otc  Give dreams   Not recently on Ambien risk   Bp usually ok on medications  CAD non critical sees cards q year.   Mood  Depressed recently with pet cat had to be put to sleep but doing ok . Otherwise   Asks about pap smear no abnormal's last about 2 years ago   Anxiety about the same.  Health Maintenance  Topic Date Due  . Zostavax  01/15/2001  . Influenza Vaccine  11/20/2013  . Mammogram  03/16/2015  . Tetanus/tdap  11/20/2017  . Colonoscopy  06/30/2018  . Pneumococcal Polysaccharide Vaccine Age 71 And Over  Completed   Health Maintenance Review   Hearing:  Ok   Vision:  No limitations at present . Last eye check UTD  Safety:  Has smoke detector and wears seat belts.  No firearms. No excess sun exposure. Sees dentist regularly.  Falls: once   Advance directive :  Reviewed    Memory: Felt to be good  , no concern from her or her family.  Depression: No anhedonia unusual crying or depressive symptomsother than grif from her cat sometimes more problematic   Nutrition: Eats well balanced diet; adequate calcium and vitamin D. No swallowing chewing problems.  Injury: no major injuries in the last six months.  Other healthcare providers:  Reviewed today .  Social:  Lives alone widowed cat died .put to sleep recnetly  No pets .   Preventive parameters: up-to-date  Reviewed   ADLS:    There are no problems or need for assistance  driving, feeding, obtaining food, dressing, toileting and bathing, managing money using phone. She is independent.  EXERCISE/ HABITS  Not much now tries to stay active Per week   No tobacco  no  etoh   ROS:  GEN/ HEENT: No fever, significant weight changes sweats headaches vision problems hearing changes, CV/ PULM; No chest pain shortness of breath cough, syncope,edema  change in exercise tolerance. GI /GU: No adominal pain, vomiting, change in bowel habits. No blood in the stool. No significant GU symptoms. SKIN/HEME: ,no acute skin rashes suspicious lesions or bleeding. No lymphadenopathy, nodules, masses.  NEURO/ PSYCH:  No neurologic signs such as weakness numbness.  Has dry patch on legs has VV no ulcers . IMM/ Allergy: No unusual infections.  Allergy .   REST of 12 system review negative except as per HPI   Past Medical History  Diagnosis Date  . Anxiety   . GERD (gastroesophageal reflux disease)   . Hyperlipidemia   . Hypertension   . Arthritis   . Chronic kidney disease     stones  . Osteopenia   . Hiatal hernia   . Diverticulosis of colon (without mention of hemorrhage)   . Allergy   . Cancer  Basal cell  . Cataract   . Depression   . ACUTE KIDNEY FAILURE UNSPECIFIED 07/12/2009  . CAD (coronary artery disease)     Mild nonobstructive by CT   Past Surgical History  Procedure Laterality Date  . Cholecystectomy    . Total knee arthroplasty      left  . Cystectomy  2001    benign  . Bladder tumor excision  2001  . Tendon repair  11/2005    right shoulder  . Lumbar disc surgery      x 2 [facility]  . Cataract extraction      bilateral  . Esophageal manometry N/A 07/13/2012    Procedure: ESOPHAGEAL MANOMETRY (EM);  Surgeon: Sable Feil, MD;  Location: [facility];  Service: Endoscopy;  Laterality: N/A;     Family History  Problem Relation Age of Onset  . Lung cancer Father     tobacco  . Kidney cancer Son    . Hypertension Brother     mom's side  . Alzheimer's disease Mother   . Osteoporosis Sister   . Breast cancer Cousin   . Colon cancer Neg Hx   . Heart disease Maternal Uncle     x 4  . Heart disease Maternal Aunt     x 4  . Diabetes Paternal Grandmother   . Diabetes Paternal Uncle     x 2    History   Social History  . Marital Status: Widowed    Spouse Name: N/A    Number of Children: 2  . Years of Education: N/A   Occupational History  . Retired     Social History Main Topics  . Smoking status: Never Smoker   . Smokeless tobacco: Never Used  . Alcohol Use: Yes     Comment: rare  . Drug Use: No  . Sexual Activity: Not on file   Other Topics Concern  . 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 amLODipine (NORVASC) 5 MG tablet Take 1 tablet (5 mg total) by mouth daily.  . Cyanocobalamin (B-12 PO) Take by mouth.  . metoprolol (LOPRESSOR) 50 MG tablet TAKE 1/2 TABLET BY MOUTH TWICE A DAY  . omeprazole (PRILOSEC) 20 MG capsule TAKE ONE CAPSULE BY MOUTH TWICE A DAY  . rosuvastatin (CRESTOR) 40 MG tablet TAKE 1 TABLET BY MOUTH EVERY DAY  . tiZANidine (ZANAFLEX) 4 MG tablet   . traMADol (ULTRAM) 50 MG tablet Take 50 mg by mouth. Taking one at night.  . [DISCONTINUED] amLODipine (NORVASC) 5 MG tablet TAKE 1 TABLET BY MOUTH EVERY DAY  . [DISCONTINUED] CRESTOR 40 MG tablet TAKE 1 TABLET BY MOUTH EVERY DAY  . [DISCONTINUED] metoprolol (LOPRESSOR) 50 MG tablet TAKE 1/2 TABLET BY MOUTH TWICE A DAY **NEEDS OV**  . DULoxetine (CYMBALTA) 30 MG capsule Take 1 capsule (30 mg total) by mouth daily. Can increase to 2 per day  . zolpidem (AMBIEN) 10 MG tablet TAKE 1/2 TO 1 TABLET AT BEDTIME AS NEEDED  . [DISCONTINUED] cyanocobalamin (,VITAMIN B-12,) 1000 MCG/ML injection Inject 1,000 mcg into the muscle every 30 (thirty) days.  .  [DISCONTINUED] hyoscyamine (LEVSIN/SL) 0.125 MG SL tablet Place 1 tablet (0.125 mg total) under the tongue every 4 (four) hours as needed.  . [DISCONTINUED] rosuvastatin (CRESTOR) 40 MG tablet Take 40 mg by mouth  at bedtime.    EXAM:  BP 116/74  Pulse 69  Temp(Src) 98 F (36.7 C) (Oral)  Ht 5' 1.5" (1.562 m)  Wt 171 lb (77.565 kg)  BMI 31.79 kg/m2  SpO2 97%  Body mass index is 31.79 kg/(m^2).  Physical Exam: Vital signs reviewed WIO:MBTD is a well-developed well-nourished alert cooperative   who appears stated age in no acute distress.  HEENT: normocephalic atraumatic , Eyes: PERRL EOM's full, conjunctiva clear, Nares: paten,t no deformity discharge or tenderness., Ears: no deformity EAC's clear TMs with normal landmarks. Mouth: clear OP, no lesions, edema.  Moist mucous membranes. Dentition in adequate repair. NECK: supple without masses, thyromegaly or bruits. CHEST/PULM:  Clear to auscultation and percussion breath sounds equal no wheeze , rales or rhonchi. No chest wall deformities or tenderness. Breast: normal large  by inspection . No dimpling, discharge, masses, tenderness or discharge . CV: PMI is nondisplaced, S1 S2 no gallops, murmurs, rubs. Peripheral pulses are full without delay.No JVD .  ABDOMEN: Bowel sounds normal nontender  No guard or rebound, no hepato splenomegal no CVA tenderness.  No hernia. Extremtities:  No clubbing cyanosis or edema, varicose veins present no ulceration no acute joint swelling or redness no focal atrophy  oa changes hands and wrist and knees  . NEURO:  Oriented x3, cranial nerves 3-12 appear to be intact, no obvious focal weakness,gait within normal limits no abnormal reflexes or asymmetrical SKIN: No acute rashes normal turgor, color, no bruising or petechiae. Dry skin and  VV  No ulcerations left more than left.  PSYCH: Oriented, good eye contact, talkative mild anxiety  cognition and judgment appear normal. LN: no cervical axillary inguinal  adenopathy No noted deficits in memory, attention, and speech.  Lab Results  Component Value Date   WBC 4.8 04/26/2013   HGB 13.7 04/26/2013   HCT 40.8 04/26/2013   PLT 167.0 04/26/2013   GLUCOSE 121* 04/26/2013   CHOL 160 04/26/2013   TRIG 110.0 04/26/2013   HDL 71.00 04/26/2013   LDLDIRECT 179.1 10/09/2007   LDLCALC 67 04/26/2013   ALT 14 04/26/2013   AST 20 04/26/2013   NA 140 04/26/2013   K 3.8 04/26/2013   CL 107 04/26/2013   CREATININE 1.0 04/26/2013   BUN 18 04/26/2013   CO2 25 04/26/2013   TSH 3.09 04/26/2013   HGBA1C 5.7 04/27/2013    ASSESSMENT AND PLAN:  Discussed the following assessment and plan:  Visit for preventive health examination - no need for pap declined pelvic as has no sx at this time prevnar 13   Other and unspecified hyperlipidemia  Unspecified essential hypertension  Agatston coronary artery calcium score between 200 and 399  Vitamin B 12 deficiency  Spine pain  Adjustment disorder with mixed anxiety and depressed mood - may benefor from cymbalta as did in past recent pet loss  Need for vaccination with 13-polyvalent pneumococcal conjugate vaccine - Plan: Pneumococcal conjugate vaccine 13-valent  Hyperglycemia - dietary changes   Osteoarthrosis, unspecified whether generalized or localized, unspecified site - pain problematic  Counseled. Hx of acute renal failure form nsaid  Cr 1 stable will follow  bp controlled today  Patient Care Team: Madelin Headings, MD as PCP - General Mardella Layman, MD as Attending Physician (Gastroenterology) Eugenia Mcalpine, MD as Attending Physician (Orthopedic Surgery) Shayne Alken, MD (Physical Medicine and Rehabilitation)  Patient Instructions  consider going back on low dose cymbalta to help with pain and mood anxiety .  blood pressure is  good . Kidney function is stable  Prevnar 13 today  No need for pap smear not advised over 65 if no hx of cancer  Bp is good  Avoid excess sugars and sweets to avoid getting diabetes.  Ambien is  a risk medicine for your age group and is habit forming  . Lets see if it helps your sleep to go back on the  Cymbalta.  now that it is generic  Can look into shingles  vaccine for reimbursement. ROV in 1 months .       Standley Brooking. Panosh M.D.    Pre visit review using our clinic review tool, if applicable. No additional management support is needed unless otherwise documented below in the visit note. Prolonged visit counseling  And care management

## 2013-06-16 NOTE — Patient Instructions (Addendum)
consider going back on low dose cymbalta to help with pain and mood anxiety .  blood pressure is good . Kidney function is stable  Prevnar 13 today  No need for pap smear not advised over 65 if no hx of cancer  Bp is good  Avoid excess sugars and sweets to avoid getting diabetes.  Ambien is a risk medicine for your age group and is habit forming  . Lets see if it helps your sleep to go back on the  Cymbalta.  now that it is generic  Can look into shingles  vaccine for reimbursement. ROV in 1 months .

## 2013-06-18 ENCOUNTER — Telehealth: Payer: Self-pay | Admitting: Internal Medicine

## 2013-06-18 NOTE — Telephone Encounter (Signed)
Relevant patient education mailed to patient.  

## 2013-07-03 ENCOUNTER — Other Ambulatory Visit: Payer: Self-pay | Admitting: Internal Medicine

## 2013-07-13 ENCOUNTER — Ambulatory Visit (INDEPENDENT_AMBULATORY_CARE_PROVIDER_SITE_OTHER): Payer: Medicare Other | Admitting: Internal Medicine

## 2013-07-13 ENCOUNTER — Encounter: Payer: Self-pay | Admitting: Internal Medicine

## 2013-07-13 VITALS — BP 120/70 | HR 55 | Temp 99.0°F | Ht 61.5 in | Wt 168.0 lb

## 2013-07-13 DIAGNOSIS — I10 Essential (primary) hypertension: Secondary | ICD-10-CM

## 2013-07-13 DIAGNOSIS — G4700 Insomnia, unspecified: Secondary | ICD-10-CM

## 2013-07-13 DIAGNOSIS — E538 Deficiency of other specified B group vitamins: Secondary | ICD-10-CM

## 2013-07-13 DIAGNOSIS — M1990 Unspecified osteoarthritis, unspecified site: Secondary | ICD-10-CM

## 2013-07-13 DIAGNOSIS — IMO0001 Reserved for inherently not codable concepts without codable children: Secondary | ICD-10-CM

## 2013-07-13 MED ORDER — DULOXETINE HCL 30 MG PO CPEP: 30.0000 mg | ORAL_CAPSULE | Freq: Two times a day (BID) | ORAL | Status: DC

## 2013-07-13 NOTE — Progress Notes (Signed)
Chief Complaint  Patient presents with  . Follow-up    Would like to know how much B12 she should be taking.  Would like a refill of her Ambien.    HPI: [name] [name] Patient comes in today for follow up of  multiple medical problems.   Last month restarted generic cymbalta   Now taking 30 pnce a day . Hard to tell difference  Yet .   Went to after hours care. For perineal abscess  Drained on own and now on antibiotic .  [place] . ? Doxycycline  .     b12 now on oral  . With injec shortage seems ok   calcium causing GI issue.  Sleep hard to get there   Without ambien but not taking now  Only allowed 90 per year anyway . In remote past  Had amnesia activity  writing checks etc.  Not recently ok to keep off.  ROS: See pertinent positives and negatives per HPI.  Past Medical History  Diagnosis Date  . Anxiety   . GERD (gastroesophageal reflux disease)   . Hyperlipidemia   . Hypertension   . Arthritis   . Chronic kidney disease     stones  . Osteopenia   . Hiatal hernia   . Diverticulosis of colon (without mention of hemorrhage)   . Allergy   . Cancer     Basal cell  . Cataract   . Depression   . ACUTE KIDNEY FAILURE UNSPECIFIED 07/12/2009  . CAD (coronary artery disease)     Mild nonobstructive by CT    Family History  Problem Relation Age of Onset  . Lung cancer Father     tobacco  . Kidney cancer Son   . Hypertension Brother     mom's side  . Alzheimer's disease Mother   . Osteoporosis Sister   . Breast cancer Cousin   . Colon cancer Neg Hx   . Heart disease Maternal Uncle     x 4  . Heart disease Maternal Aunt     x 4  . Diabetes Paternal Grandmother   . Diabetes Paternal Uncle     x 2    History   Social History  . Marital Status: Widowed    Spouse Name: N/A    Number of Children: 2  . Years of Education: N/A   Occupational History  . Retired     Social History Main Topics  . Smoking status: Never Smoker   . Smokeless tobacco:  Never Used  . Alcohol Use: Yes     Comment: rare  . Drug Use: No  . Sexual Activity: None   Other Topics Concern  . 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 amLODipine (NORVASC) 5 MG tablet TAKE 1 TABLET BY MOUTH EVERY DAY  . Cyanocobalamin (B-12 PO) Take by mouth.  . DULoxetine (CYMBALTA) 30 MG capsule Take 1 capsule (30 mg total) by mouth 2 (two) times daily.  . metoprolol (LOPRESSOR) 50 MG tablet TAKE 1/2 TABLET BY MOUTH TWICE A DAY  . omeprazole (PRILOSEC) 20 MG capsule TAKE ONE CAPSULE BY MOUTH TWICE A DAY  . rosuvastatin (CRESTOR) 40 MG tablet TAKE 1 TABLET BY MOUTH EVERY DAY  . tiZANidine (ZANAFLEX) 4 MG tablet   . traMADol (ULTRAM) 50 MG tablet Take 50  mg by mouth. Taking one at night.  . zolpidem (AMBIEN) 10 MG tablet TAKE 1/2 TO 1 TABLET AT BEDTIME AS NEEDED  . [DISCONTINUED] DULoxetine (CYMBALTA) 30 MG capsule Take 1 capsule (30 mg total) by mouth daily. Can increase to 2 per day    EXAM:  BP 120/70  Pulse 55  Temp(Src) 99 F (37.2 C) (Oral)  Ht 5' 1.5" (1.562 m)  Wt 168 lb (76.204 kg)  BMI 31.23 kg/m2  SpO2 97%  Body mass index is 31.23 kg/(m^2).  GENERAL: vitals reviewed and listed above, alert, oriented, appears well hydrated and in no acute distress NECK: no obvious masses on inspection palpation  LUNGS: clear to auscultation bilaterally, no wheezes, rales or rhonchi, CV: HRRR, no clubbing cyanosis or  peripheral edema nl cap refill  PSYCH: pleasant and cooperative, no obvious depression or anxiety calmer today  Nl speech  Lab Results  Component Value Date   WBC 4.8 04/26/2013   HGB 13.7 04/26/2013   HCT 40.8 04/26/2013   PLT 167.0 04/26/2013   GLUCOSE 121* 04/26/2013   CHOL 160 04/26/2013   TRIG 110.0 04/26/2013   HDL 71.00 04/26/2013   LDLDIRECT 179.1 10/09/2007   LDLCALC 67 04/26/2013   ALT 14 04/26/2013   AST 20  04/26/2013   NA 140 04/26/2013   K 3.8 04/26/2013   CL 107 04/26/2013   CREATININE 1.0 04/26/2013   BUN 18 04/26/2013   CO2 25 04/26/2013   TSH 3.09 04/26/2013   HGBA1C 5.7 04/27/2013    ASSESSMENT AND PLAN:  Discussed the following assessment and plan:  Osteoarthrosis, unspecified whether generalized or localized, unspecified site - increase to cymbalta 30 bid for now   Vitamin B 12 deficiency - changes to oral after shortage from injec will check level at next blood tests   Unspecified essential hypertension - controlled   FIBROMYALGIA  Insomnia - ongoign problematic would try to avoid ambien at this time   -Patient advised to return or notify health care team  if symptoms worsen ,persist or new concerns arise.  Patient Instructions  Increase cymbalta to 2 30 mg per day or 60 mg per day.  Continue lifestyle intervention healthy eating and exercise . Continue vitamin b12 oral  1069mcg per day and we can check a level at the next blood test in 2-3 months . Consider trazadone for sleep in future if needed but lets see what the cymbalta does. Also tif take melatonin take it 3-4 hours before sleep time.  Vit b12 level and bmp pre visit in 2-3 months      Total visit 56mins > 50% spent counseling and coordinating care     [place] K. Yosiel Thieme M.D.  Pre visit review using our clinic review tool, if applicable. No additional management support is needed unless otherwise documented below in the visit note.

## 2013-07-13 NOTE — Patient Instructions (Signed)
Increase cymbalta to 2 30 mg per day or 60 mg per day.  Continue lifestyle intervention healthy eating and exercise . Continue vitamin b12 oral  1013mcg per day and we can check a level at the next blood test in 2-3 months . Consider trazadone for sleep in future if needed but lets see what the cymbalta does. Also tif take melatonin take it 3-4 hours before sleep time.  Vit b12 level and bmp pre visit in 2-3 months

## 2013-09-17 ENCOUNTER — Ambulatory Visit (INDEPENDENT_AMBULATORY_CARE_PROVIDER_SITE_OTHER): Payer: Medicare Other | Admitting: Internal Medicine

## 2013-09-17 ENCOUNTER — Encounter: Payer: Self-pay | Admitting: Internal Medicine

## 2013-09-17 VITALS — BP 132/82 | HR 67 | Temp 98.6°F | Ht 61.5 in | Wt 173.0 lb

## 2013-09-17 DIAGNOSIS — E538 Deficiency of other specified B group vitamins: Secondary | ICD-10-CM

## 2013-09-17 DIAGNOSIS — M1990 Unspecified osteoarthritis, unspecified site: Secondary | ICD-10-CM

## 2013-09-17 DIAGNOSIS — I10 Essential (primary) hypertension: Secondary | ICD-10-CM

## 2013-09-17 DIAGNOSIS — R739 Hyperglycemia, unspecified: Secondary | ICD-10-CM

## 2013-09-17 DIAGNOSIS — Z79899 Other long term (current) drug therapy: Secondary | ICD-10-CM | POA: Insufficient documentation

## 2013-09-17 DIAGNOSIS — R7309 Other abnormal glucose: Secondary | ICD-10-CM

## 2013-09-17 LAB — GLUCOSE, POCT (MANUAL RESULT ENTRY): POC Glucose: 113 mg/dl — AB (ref 70–99)

## 2013-09-17 MED ORDER — DULOXETINE HCL 30 MG PO CPEP: 30.0000 mg | ORAL_CAPSULE | Freq: Every day | ORAL | Status: DC

## 2013-09-17 MED ORDER — DULOXETINE HCL 60 MG PO CPEP: 60.0000 mg | ORAL_CAPSULE | Freq: Every day | ORAL | Status: DC

## 2013-09-17 NOTE — Progress Notes (Signed)
Chief Complaint  Patient presents with  . Follow-up    HPI: [name] [name]  comes in today for follow up of  multiple medical problems.  Cymbalta :   Up to 60 per day  Some help but did better on hgiher dose in past .  Sleep: trying to not be off ambien.   Now is dreaming again.   No meds  Ran out of b12 3 weeks ago  Taking oral 1000  Only hs tramadol if needed  Planning on getting her right knee replaced before shoulder and back and her vengeance. eating ok but dose like sweets   Plan Last visit: Osteoarthrosis, unspecified whether generalized or localized, unspecified site - increase to cymbalta 30 bid for now  Vitamin B 12 deficiency - changes to oral after shortage from injec will check level at next blood tests  Unspecified essential hypertension - controlled  FIBROMYALGIA Insomnia - ongoign problematic would try to avoid ambien at this time   Patient Instructions  Increase cymbalta to 2 30 mg per day or 60 mg per day. Continue lifestyle intervention healthy eating and exercise . Continue vitamin b12 oral  1071mcg per day and we can check a level at the next blood test in 2-3 months . Consider trazadone for sleep in future if needed but lets see what the cymbalta does. Also tif take melatonin take it 3-4 hours before sleep time.  Vit b12 level and bmp pre visit in 2-3 months   ROS: See pertinent positives and negatives per HPI.  Past Medical History  Diagnosis Date  . Anxiety   . GERD (gastroesophageal reflux disease)   . Hyperlipidemia   . Hypertension   . Arthritis   . Chronic kidney disease     stones  . Osteopenia   . Hiatal hernia   . Diverticulosis of colon (without mention of hemorrhage)   . Allergy   . Cancer     Basal cell  . Cataract   . Depression   . ACUTE KIDNEY FAILURE UNSPECIFIED 07/12/2009  . CAD (coronary artery disease)     Mild nonobstructive by CT    Family History  Problem Relation Age of Onset  . Lung cancer Father     tobacco  .  Kidney cancer Son   . Hypertension Brother     mom's side  . Alzheimer's disease Mother   . Osteoporosis Sister   . Breast cancer Cousin   . Colon cancer Neg Hx   . Heart disease Maternal Uncle     x 4  . Heart disease Maternal Aunt     x 4  . Diabetes Paternal Grandmother   . Diabetes Paternal Uncle     x 2    History   Social History  . Marital Status: Widowed    Spouse Name: N/A    Number of Children: 2  . Years of Education: N/A   Occupational History  . Retired     Social History Main Topics  . Smoking status: Never Smoker   . Smokeless tobacco: Never Used  . Alcohol Use: Yes     Comment: rare  . Drug Use: No  . Sexual Activity: None   Other Topics Concern  . 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 amLODipine (NORVASC) 5 MG tablet TAKE  1 TABLET BY MOUTH EVERY DAY  . DULoxetine (CYMBALTA) 30 MG capsule Take 1 capsule (30 mg total) by mouth daily. With 60 mg total 90 mg per day  . metoprolol (LOPRESSOR) 50 MG tablet TAKE 1/2 TABLET BY MOUTH TWICE A DAY  . omeprazole (PRILOSEC) 20 MG capsule TAKE ONE CAPSULE BY MOUTH TWICE A DAY  . Probiotic Product (ALIGN PO) Take 1 tablet by mouth daily.  . rosuvastatin (CRESTOR) 40 MG tablet TAKE 1 TABLET BY MOUTH EVERY DAY  . tiZANidine (ZANAFLEX) 4 MG tablet   . traMADol (ULTRAM) 50 MG tablet Take 50 mg by mouth. Taking one at night.  . [DISCONTINUED] DULoxetine (CYMBALTA) 30 MG capsule Take 1 capsule (30 mg total) by mouth 2 (two) times daily.  . Cyanocobalamin (B-12 PO) Take by mouth.  . DULoxetine (CYMBALTA) 60 MG capsule Take 1 capsule (60 mg total) by mouth daily. Total 90 mg per day  . [DISCONTINUED] zolpidem (AMBIEN) 10 MG tablet TAKE 1/2 TO 1 TABLET AT BEDTIME AS NEEDED    EXAM:  BP 132/82  Pulse 67  Temp(Src) 98.6 F (37 C) (Oral)  Ht 5' 1.5" (1.562 m)  Wt  173 lb (78.472 kg)  BMI 32.16 kg/m2  SpO2 97%  Body mass index is 32.16 kg/(m^2).  GENERAL: vitals reviewed and listed above, alert, oriented, appears well hydrated and in no acute distress HEENT: atraumatic, conjunctiva  clear, no obvious abnormalities on inspection of external nose and ears OP : no lesion edema or exudate  NECK: no obvious masses on inspection palpation  LUNGS: clear to auscultation bilaterally, no wheezes, rales or rhonchi, good air movement CV: HRRR, no clubbing cyanosis or  peripheral edema nl cap refill  MS: moves all extremities without noticeable focal  abnormality mildly antalgic limps with the right knee favored PSYCH: pleasant and cooperative, no obvious depression or anxiety Lab Results  Component Value Date   WBC 4.8 04/26/2013   HGB 13.7 04/26/2013   HCT 40.8 04/26/2013   PLT 167.0 04/26/2013   GLUCOSE 121* 04/26/2013   CHOL 160 04/26/2013   TRIG 110.0 04/26/2013   HDL 71.00 04/26/2013   LDLDIRECT 179.1 10/09/2007   LDLCALC 67 04/26/2013   ALT 14 04/26/2013   AST 20 04/26/2013   NA 140 04/26/2013   K 3.8 04/26/2013   CL 107 04/26/2013   CREATININE 1.0 04/26/2013   BUN 18 04/26/2013   CO2 25 04/26/2013   TSH 3.09 04/26/2013   HGBA1C 5.7 04/27/2013    ASSESSMENT AND PLAN:  Discussed the following assessment and plan:  Osteoarthrosis, unspecified whether generalized or localized, unspecified site - Increase the Cymbalta to 90 mg at this time consider the 120 if tolerated in the future caution with serotonin interaction  Vitamin B 12 deficiency - Ran out of B12 take irregularly get B12 level in 3-4 weeks  Unspecified essential hypertension  Hyperglycemia - FBS elevated but not in diabetic range intensify lifestyle changes - Plan: POC Glucose (CBG)  Medication management  -Patient advised to return or notify health care team  if symptoms worsen ,persist or new concerns arise.  Patient Instructions  Take  1000 per day of B12    bp is good today.   Labs in 3-4 weeks  BMP  and B12 level   Increase cymbalta to 90 mg per day .   Call in a month if want to increase to 120 mg per day. Caution when taking with tramadol ( too much sertonin) .  ROV in  2-3 months .   Standley Brooking. Alexys Gassett M.D.  Pre visit review using our clinic review tool, if applicable. No additional management support is needed unless otherwise documented below in the visit note.

## 2013-09-17 NOTE — Patient Instructions (Addendum)
Take  1000 per day of B12    bp is good today.   Labs in 3-4 weeks  BMP and B12 level   Increase cymbalta to 90 mg per day .   Call in a month if want to increase to 120 mg per day. Caution when taking with tramadol ( too much sertonin) .  ROV in 2-3 months .

## 2013-10-14 ENCOUNTER — Other Ambulatory Visit: Payer: Medicare Other

## 2013-10-19 ENCOUNTER — Other Ambulatory Visit (INDEPENDENT_AMBULATORY_CARE_PROVIDER_SITE_OTHER): Payer: Medicare Other

## 2013-10-19 DIAGNOSIS — I10 Essential (primary) hypertension: Secondary | ICD-10-CM

## 2013-10-19 DIAGNOSIS — D509 Iron deficiency anemia, unspecified: Secondary | ICD-10-CM

## 2013-10-19 DIAGNOSIS — D518 Other vitamin B12 deficiency anemias: Secondary | ICD-10-CM

## 2013-10-19 LAB — BASIC METABOLIC PANEL
BUN: 15 mg/dL (ref 6–23)
CALCIUM: 9.5 mg/dL (ref 8.4–10.5)
CO2: 27 meq/L (ref 19–32)
Chloride: 105 mEq/L (ref 96–112)
Creatinine, Ser: 1 mg/dL (ref 0.4–1.2)
GFR: 58.48 mL/min — AB (ref >60.00)
Glucose, Bld: 117 mg/dL — ABNORMAL HIGH (ref 70–99)
Potassium: 3.8 mEq/L (ref 3.5–5.1)
Sodium: 140 mEq/L (ref 135–145)

## 2013-10-19 LAB — VITAMIN B12: Vitamin B-12: 530 pg/mL (ref 211–911)

## 2013-11-01 ENCOUNTER — Encounter: Payer: Self-pay | Admitting: Family Medicine

## 2013-11-12 ENCOUNTER — Encounter: Payer: Self-pay | Admitting: Family Medicine

## 2013-11-12 ENCOUNTER — Telehealth: Payer: Self-pay | Admitting: Internal Medicine

## 2013-11-12 ENCOUNTER — Ambulatory Visit (INDEPENDENT_AMBULATORY_CARE_PROVIDER_SITE_OTHER): Payer: Medicare Other | Admitting: Family Medicine

## 2013-11-12 VITALS — BP 130/80 | HR 82 | Temp 97.9°F | Wt 180.0 lb

## 2013-11-12 DIAGNOSIS — R319 Hematuria, unspecified: Secondary | ICD-10-CM

## 2013-11-12 DIAGNOSIS — R350 Frequency of micturition: Secondary | ICD-10-CM

## 2013-11-12 LAB — POCT URINALYSIS DIPSTICK
Bilirubin, UA: NEGATIVE
GLUCOSE UA: NEGATIVE
KETONES UA: NEGATIVE
Nitrite, UA: NEGATIVE
Spec Grav, UA: 1.02
UROBILINOGEN UA: 1
pH, UA: 6

## 2013-11-12 MED ORDER — CEPHALEXIN 500 MG PO CAPS: 500.0000 mg | ORAL_CAPSULE | Freq: Three times a day (TID) | ORAL | Status: DC

## 2013-11-12 NOTE — Patient Instructions (Signed)

## 2013-11-12 NOTE — Progress Notes (Signed)
   Subjective:    Patient ID: [name] [name], female    DOB: 1940-10-13, 73 y.o.   MRN: [id number]  Urinary Frequency  Associated symptoms include frequency. Pertinent negatives include no chills, hematuria, nausea or vomiting.   Acute visit for urinary frequency. Duration has been 2 days. She denies any significant burning with urination. Couple episodes of mild urine incontinence. Denies any nausea or vomiting. No fevers or chills. She was concerned about possible urinary tract infection. No flank pain. Past history interstitial cystitis. No gross hematuria. Allergy to sulfa.  Past Medical History  Diagnosis Date  . Anxiety   . GERD (gastroesophageal reflux disease)   . Hyperlipidemia   . Hypertension   . Arthritis   . Chronic kidney disease     stones  . Osteopenia   . Hiatal hernia   . Diverticulosis of colon (without mention of hemorrhage)   . Allergy   . Cancer     Basal cell  . Cataract   . Depression   . ACUTE KIDNEY FAILURE UNSPECIFIED 07/12/2009  . CAD (coronary artery disease)     Mild nonobstructive by CT   Past Surgical History  Procedure Laterality Date  . Cholecystectomy    . Total knee arthroplasty      left  . Cystectomy  2001    benign  . Bladder tumor excision  2001  . Tendon repair  11/2005    right shoulder  . Lumbar disc surgery      x 2 elsner  . Cataract extraction      bilateral  . Esophageal manometry N/A 07/13/2012    Procedure: ESOPHAGEAL MANOMETRY (EM);  Surgeon: Sable Feil, MD;  Location: [facility];  Service: Endoscopy;  Laterality: N/A;    reports that she has never smoked. She has never used smokeless tobacco. She reports that she drinks alcohol. She reports that she does not use illicit drugs. family history includes Alzheimer's disease in her mother; Breast cancer in her cousin; Diabetes in her paternal grandmother and paternal uncle; Heart disease in her maternal aunt and maternal uncle; Hypertension in her brother; Kidney cancer  in her son; Lung cancer in her father; Osteoporosis in her sister. There is no history of Colon cancer. Allergies  Allergen Reactions  . Lactose Intolerance (Gi) Other (See Comments)    Gi upset  . Meloxicam     REACTION: acute renal failure  with hypovolemic trigger Hospital  3 2011  . Sulfamethoxazole Hives      Review of Systems  Constitutional: Negative for fever, chills and appetite change.  Gastrointestinal: Negative for nausea, vomiting, abdominal pain, diarrhea and constipation.  Genitourinary: Positive for frequency. Negative for hematuria and difficulty urinating.  Musculoskeletal: Negative for back pain.  Neurological: Negative for dizziness.       Objective:   Physical Exam  Constitutional: She appears well-developed and well-nourished.  Cardiovascular: Normal rate and regular rhythm.   Pulmonary/Chest: Effort normal and breath sounds normal. No respiratory distress. She has no wheezes. She has no rales.          Assessment & Plan:  Urine frequency. She has trace blood and leukocytes on dipstick. Urine culture sent. Keflex 500 mg 3 times a day for 7 days pending culture results

## 2013-11-12 NOTE — Telephone Encounter (Signed)
°  Pt reports urinary incontinence that began on 11/10/13 while on a trip. She keeps voiding frequently (q 30 minutes) so she isn't sure if the incontinence is still there. She also has flank pain, just feels bad. Hx of acute kidney failure in 2011. Appt sched for 1600 today w/Dr. Elease Hashimoto. Flank Pain Protocol. "Flank pain or low back pain and urinary symptoms"

## 2013-11-12 NOTE — Telephone Encounter (Signed)
Noted  

## 2013-11-12 NOTE — Progress Notes (Signed)
Pre visit review using our clinic review tool, if applicable. No additional management support is needed unless otherwise documented below in the visit note. 

## 2013-11-14 LAB — URINE CULTURE

## 2013-12-16 ENCOUNTER — Encounter: Payer: Self-pay | Admitting: Internal Medicine

## 2013-12-16 ENCOUNTER — Ambulatory Visit (INDEPENDENT_AMBULATORY_CARE_PROVIDER_SITE_OTHER): Payer: Medicare Other | Admitting: Internal Medicine

## 2013-12-16 VITALS — BP 136/76 | Temp 98.4°F | Ht 61.5 in | Wt 178.8 lb

## 2013-12-16 DIAGNOSIS — N183 Chronic kidney disease, stage 3 unspecified: Secondary | ICD-10-CM | POA: Insufficient documentation

## 2013-12-16 DIAGNOSIS — M1990 Unspecified osteoarthritis, unspecified site: Secondary | ICD-10-CM

## 2013-12-16 DIAGNOSIS — D518 Other vitamin B12 deficiency anemias: Secondary | ICD-10-CM

## 2013-12-16 DIAGNOSIS — I10 Essential (primary) hypertension: Secondary | ICD-10-CM

## 2013-12-16 DIAGNOSIS — F4323 Adjustment disorder with mixed anxiety and depressed mood: Secondary | ICD-10-CM

## 2013-12-16 DIAGNOSIS — Z79899 Other long term (current) drug therapy: Secondary | ICD-10-CM

## 2013-12-16 DIAGNOSIS — R739 Hyperglycemia, unspecified: Secondary | ICD-10-CM

## 2013-12-16 DIAGNOSIS — R7309 Other abnormal glucose: Secondary | ICD-10-CM

## 2013-12-16 DIAGNOSIS — N189 Chronic kidney disease, unspecified: Secondary | ICD-10-CM

## 2013-12-16 LAB — GLUCOSE, POCT (MANUAL RESULT ENTRY): POC Glucose: 100 mg/dl — AB (ref 70–99)

## 2013-12-16 NOTE — Progress Notes (Signed)
Pre visit review using our clinic review tool, if applicable. No additional management support is needed unless otherwise documented below in the visit note.  Chief Complaint  Patient presents with  . Follow-up    Will wait on the high dose flu vaccine when it comes in Sept.    HPI: [name] [name]   Comes in for fu multiple issues.   Pain: cymbalta  Now at 90 mg some help some dry mouth    And constipation taking align usually get sdiarrhea with ibs; tramadol at night  Once    BP  No change  Has appt cards next week.  Had uti sx but cx Mult sp lin July  Better   Recurrent abscess lump.  Getting better    Some se of med dry mouth and constipation.  ROS: See pertinent positives and negatives per HPI. ocass leakage  But bladder  infection is better.   Mood  Pretty good   Past Medical History  Diagnosis Date  . Anxiety   . GERD (gastroesophageal reflux disease)   . Hyperlipidemia   . Hypertension   . Arthritis   . Chronic kidney disease     stones  . Osteopenia   . Hiatal hernia   . Diverticulosis of colon (without mention of hemorrhage)   . Allergy   . Cancer     Basal cell  . Cataract   . Depression   . ACUTE KIDNEY FAILURE UNSPECIFIED 07/12/2009  . CAD (coronary artery disease)     Mild nonobstructive by CT    Family History  Problem Relation Age of Onset  . Lung cancer Father     tobacco  . Kidney cancer Son   . Hypertension Brother     mom's side  . Alzheimer's disease Mother   . Osteoporosis Sister   . Breast cancer Cousin   . Colon cancer Neg Hx   . Heart disease Maternal Uncle     x 4  . Heart disease Maternal Aunt     x 4  . Diabetes Paternal Grandmother   . Diabetes Paternal Uncle     x 2    History   Social History  . Marital Status: Widowed    Spouse Name: N/A    Number of Children: 2  . Years of Education: N/A   Occupational History  . Retired     Social History Main Topics  . Smoking status: Never Smoker   . Smokeless tobacco:  Never Used  . Alcohol Use: Yes     Comment: rare  . Drug Use: No  . Sexual Activity: None   Other Topics Concern  . 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 amLODipine (NORVASC) 5 MG tablet TAKE 1 TABLET BY MOUTH EVERY DAY  . Cyanocobalamin (B-12 PO) Take by mouth.  . DULoxetine (CYMBALTA) 30 MG capsule Take 1 capsule (30 mg total) by mouth daily. With 60 mg total 90 mg per day  . DULoxetine (CYMBALTA) 60 MG capsule Take 1 capsule (60 mg total) by mouth daily. Total 90 mg per day  . metoprolol (LOPRESSOR) 50 MG tablet TAKE 1/2 TABLET BY MOUTH TWICE A DAY  . omeprazole (PRILOSEC) 20 MG capsule TAKE ONE CAPSULE BY MOUTH TWICE A DAY  . Probiotic Product (ALIGN PO) Take 1 tablet by mouth  daily.  . rosuvastatin (CRESTOR) 40 MG tablet TAKE 1 TABLET BY MOUTH EVERY DAY  . tiZANidine (ZANAFLEX) 4 MG tablet   . traMADol (ULTRAM) 50 MG tablet Take 50 mg by mouth. Taking one at night.  . [DISCONTINUED] cephALEXin (KEFLEX) 500 MG capsule Take 1 capsule (500 mg total) by mouth 3 (three) times daily.    EXAM:  BP 136/76  Temp(Src) 98.4 F (36.9 C) (Oral)  Ht 5' 1.5" (1.562 m)  Wt 178 lb 12.8 oz (81.103 kg)  BMI 33.24 kg/m2  Body mass index is 33.24 kg/(m^2).  GENERAL: vitals reviewed and listed above, alert, oriented, appears well hydrated and in no acute distress  HEENT: atraumatic, conjunctiva  clear, no obvious abnormalities on inspection of external nose and ears NECK: no obvious masses on inspection palpation  LUNGS: clear to auscultation bilaterally, no wheezes, rales or rhonchi, good air movement CV: HRRR, no clubbing cyanosis  nl cap refill  MS: moves all extremities Sig OA changes no redness  PSYCH: pleasant and cooperative, no obvious depression or anxiety  Much less anxiety  Lab Results  Component Value Date   WBC 4.8 04/26/2013    HGB 13.7 04/26/2013   HCT 40.8 04/26/2013   PLT 167.0 04/26/2013   GLUCOSE 117* 10/19/2013   CHOL 160 04/26/2013   TRIG 110.0 04/26/2013   HDL 71.00 04/26/2013   LDLDIRECT 179.1 10/09/2007   LDLCALC 67 04/26/2013   ALT 14 04/26/2013   AST 20 04/26/2013   NA 140 10/19/2013   K 3.8 10/19/2013   CL 105 10/19/2013   CREATININE 1.0 10/19/2013   BUN 15 10/19/2013   CO2 27 10/19/2013   TSH 3.09 04/26/2013   HGBA1C 5.7 04/27/2013   fbs 100  ASSESSMENT AND PLAN:  Discussed the following assessment and plan:  Osteoarthrosis, unspecified whether generalized or localized, unspecified site - cymbalta helpful can consier going to 120  and call if need rx   Medication management - disc risk benefit side effects   Other vitamin B12 deficiency anemia - adequate levels.  Unspecified essential hypertension - controlled   Hyperglycemia - congtrolled no diabetes at this time - Plan: POC Glucose (CBG)  Adjustment disorder with mixed anxiety and depressed mood - stable doing well.   CKD (chronic kidney disease), unspecified stage - creatinine 1 stable   -Patient advised to return or notify health care team  if symptoms worsen ,persist or new concerns arise.  Patient Instructions  Ok to try 120 mg cymbalta ( 2- 60 mg ) and call us after 2 weeks if you want to stay on this dose  ( if helps pain with not too many side effects .   Warm compresses to  Area of concern sounds like a cyst that ocassioinally gets infected.  Plan fu in about 3 months  Bmp and hga1c pre visit  Your blood sugar is good today.     Standley Brooking. Kemon Devincenzi M.D.

## 2013-12-16 NOTE — Patient Instructions (Addendum)
Ok to try 120 mg cymbalta ( 2- 60 mg ) and call us after 2 weeks if you want to stay on this dose  ( if helps pain with not too many side effects .   Warm compresses to  Area of concern sounds like a cyst that ocassioinally gets infected.  Plan fu in about 3 months  Bmp and hga1c pre visit  Your blood sugar is good today.

## 2013-12-20 ENCOUNTER — Encounter: Payer: Self-pay | Admitting: Cardiology

## 2013-12-20 ENCOUNTER — Ambulatory Visit (INDEPENDENT_AMBULATORY_CARE_PROVIDER_SITE_OTHER): Payer: Medicare Other | Admitting: Cardiology

## 2013-12-20 VITALS — BP 116/68 | HR 55 | Ht 61.0 in | Wt 181.0 lb

## 2013-12-20 DIAGNOSIS — R079 Chest pain, unspecified: Secondary | ICD-10-CM

## 2013-12-20 NOTE — Progress Notes (Signed)
HPI  The patient was in the emergency room in early December of 2013 with chest pain. She had no objective evidence of ischemia. She did have a cardiac CT. This demonstrated a calcium score of 206 with foci in RCA and LAD 67th percentile for age and sex matched controls.  Coronary Arteries: Right dominant with no anomalies LM-Normal LAD- Less than 30% calcific plaque in the proximal and mid vessel. Distal vessel normal D1: large vessel with less than 30% calcific plaque in mid vessel Circumflex- normal OM1- large vessel normal AV groove- normal RCA- large dominate vessel. Less than 30% calcific plaque in the proximal mid and distal vessel.   Says that time she has multiple somatic complaints.  However, she's not having any new chest pressure, neck or arm discomfort.She's not having any new palpitations, presyncope or syncope. She has no PND or orthopnea. She has some occasional mild dizziness when she stands up quickly. She is somewhat limited by back pain and joint pain. She's actually going to have right knee surgery probably in October.  Allergies  Allergen Reactions  . Lactose Intolerance (Gi) Other (See Comments)    Gi upset  . Meloxicam     REACTION: acute renal failure  with hypovolemic trigger [facility]  3 2011  . Sulfamethoxazole Hives    Current Outpatient Prescriptions  Medication Sig Dispense Refill  . amLODipine (NORVASC) 5 MG tablet Take 2.5 mg by mouth at bedtime.      . bifidobacterium infantis (ALIGN) capsule Take 1 capsule by mouth daily.  14 capsule  0  . cyanocobalamin (,VITAMIN B-12,) 1000 MCG/ML injection Inject 1,000 mcg into the muscle every 30 (thirty) days.      . metoprolol (LOPRESSOR) 50 MG tablet Take 25 mg by mouth 2 (two) times daily.      Marland Kitchen omeprazole (PRILOSEC) 20 MG capsule Take 20 mg by mouth 2 (two) times daily.      . rosuvastatin (CRESTOR) 40 MG tablet Take 40 mg by mouth at bedtime.      . Vitamin D, Ergocalciferol, (DRISDOL) 50000 UNITS CAPS Take  50,000 Units by mouth every 7 (seven) days. Takes on Friday      . zolpidem (AMBIEN) 10 MG tablet Take 5 mg by mouth at bedtime.         Past Medical History  Diagnosis Date  . Anxiety   . GERD (gastroesophageal reflux disease)   . Hyperlipidemia   . Hypertension   . Arthritis   . Chronic kidney disease     stones  . Osteopenia   . Hiatal hernia   . Diverticulosis of colon (without mention of hemorrhage)   . Allergy   . Cancer     Basal cell  . Cataract   . Depression   . ACUTE KIDNEY FAILURE UNSPECIFIED 07/12/2009  . CAD (coronary artery disease)     Mild nonobstructive by CT    Past Surgical History  Procedure Laterality Date  . Cholecystectomy    . Total knee arthroplasty      left  . Cystectomy  2001    benign  . Bladder tumor excision  2001  . Tendon repair  11/2005    right shoulder  . Lumbar disc surgery      x 2 elsner  . Cataract extraction      bilateral  . Esophageal manometry N/A 07/13/2012    Procedure: ESOPHAGEAL MANOMETRY (EM);  Surgeon: Sable Feil, MD;  Location: [facility];  Service: Endoscopy;  Laterality: N/A;    ROS:  Positive for depression, headaches, dizziness, sore throat, joint pains, back pain, cough, fatigue, diarrhea, constipation, ureter or bowel syndrome, reflux, difficulty swallowing, cramps, joint pains, edema, varicose veins, rhinitis. Otherwise as stated in the HPI and negative for all other systems.  PHYSICAL EXAM BP 116/68  Pulse 55  Ht 5\' 1"  (1.549 m)  Wt 181 lb (82.101 kg)  BMI 34.22 kg/m2 GENERAL:  Well appearing HEENT:  Pupils equal round and reactive, fundi not visualized, oral mucosa unremarkable NECK:  No jugular venous distention, waveform within normal limits, carotid upstroke brisk and symmetric, soft bruits, no thyromegaly LYMPHATICS:  No cervical, inguinal adenopathy LUNGS:  Clear to auscultation bilaterally BACK:  No CVA tenderness CHEST:  Unremarkable HEART:  PMI not displaced or sustained,S1 and S2  within normal limits, no S3, no S4, no clicks, no rubs, no murmurs ABD:  Flat, positive bowel sounds normal in frequency in pitch, no bruits, no rebound, no guarding, no midline pulsatile mass, no hepatomegaly, no splenomegaly EXT:  2 plus pulses throughout, no edema, no cyanosis no clubbing   EKG: Sinus rhythm, rate 55, axis within normal limits, intervals within normal limits, no acute ST-T wave changes.  12/20/2013   ASSESSMENT AND PLAN  CAD - She does have some mild nonobstructive coronary disease. She will continue with risk reduction. No further imaging is indicated.  She would be at acceptable risk for surgery if that happens in the next 6 months.  Bruit - The patient has a slight carotid bruit. However, she had only minimal plaque in 2014. No further imaging is indicated.  Chest pain  - This was atypical. She had evaluation as above. No change in therapy is indicated. This does not appear to be cardiac in etiology.   Dyslipidemia - She is on an excellent dose of Crestor. Her lipid profile the arlier this year wasexcellent. She will continue the meds as listed.

## 2013-12-20 NOTE — Patient Instructions (Signed)
Your physician recommends that you schedule a follow-up appointment in:  One year with Dr. Hochrein  

## 2013-12-28 ENCOUNTER — Telehealth: Payer: Self-pay | Admitting: Family Medicine

## 2013-12-28 MED ORDER — METOPROLOL TARTRATE 50 MG PO TABS: ORAL_TABLET | ORAL | Status: DC

## 2013-12-28 MED ORDER — OMEPRAZOLE 20 MG PO CPDR: DELAYED_RELEASE_CAPSULE | ORAL | Status: DC

## 2013-12-28 NOTE — Telephone Encounter (Signed)
Received a fax from [facility] on [address] for a 90 day refill of metoprolol (LOPRESSOR) 50 MG tablet.  Last filled on 06/16/13 for 1 year.  Will fill until Feb. 2016.

## 2013-12-28 NOTE — Telephone Encounter (Signed)
Received a fax from [facility] on [address].  Pt is asking for a 90 day supply of omeprazole (PRILOSEC) 20 MG capsule. Last CPE in Feb. 2015.  Will fill until Feb. 2016.

## 2014-03-14 ENCOUNTER — Ambulatory Visit: Payer: Medicare Other

## 2014-03-14 ENCOUNTER — Ambulatory Visit (INDEPENDENT_AMBULATORY_CARE_PROVIDER_SITE_OTHER): Payer: Medicare Other | Admitting: *Deleted

## 2014-03-14 ENCOUNTER — Other Ambulatory Visit (INDEPENDENT_AMBULATORY_CARE_PROVIDER_SITE_OTHER): Payer: Medicare Other

## 2014-03-14 DIAGNOSIS — I10 Essential (primary) hypertension: Secondary | ICD-10-CM

## 2014-03-14 DIAGNOSIS — E119 Type 2 diabetes mellitus without complications: Secondary | ICD-10-CM

## 2014-03-14 DIAGNOSIS — Z23 Encounter for immunization: Secondary | ICD-10-CM

## 2014-03-14 LAB — BASIC METABOLIC PANEL
BUN: 16 mg/dL (ref 6–23)
CALCIUM: 9.4 mg/dL (ref 8.4–10.5)
CO2: 27 mEq/L (ref 19–32)
CREATININE: 0.9 mg/dL (ref 0.4–1.2)
Chloride: 107 mEq/L (ref 96–112)
GFR: 65.2 mL/min (ref >60.00)
Glucose, Bld: 124 mg/dL — ABNORMAL HIGH (ref 70–99)
Potassium: 4.5 mEq/L (ref 3.5–5.1)
SODIUM: 141 meq/L (ref 135–145)

## 2014-03-14 LAB — HEMOGLOBIN A1C: HEMOGLOBIN A1C: 6 % (ref 4.6–6.5)

## 2014-03-21 ENCOUNTER — Ambulatory Visit (INDEPENDENT_AMBULATORY_CARE_PROVIDER_SITE_OTHER): Payer: Medicare Other | Admitting: Internal Medicine

## 2014-03-21 ENCOUNTER — Encounter: Payer: Self-pay | Admitting: Internal Medicine

## 2014-03-21 VITALS — BP 128/80 | Temp 97.9°F | Ht 61.5 in | Wt 181.5 lb

## 2014-03-21 DIAGNOSIS — Z79899 Other long term (current) drug therapy: Secondary | ICD-10-CM

## 2014-03-21 DIAGNOSIS — R739 Hyperglycemia, unspecified: Secondary | ICD-10-CM

## 2014-03-21 DIAGNOSIS — F4323 Adjustment disorder with mixed anxiety and depressed mood: Secondary | ICD-10-CM

## 2014-03-21 DIAGNOSIS — N189 Chronic kidney disease, unspecified: Secondary | ICD-10-CM

## 2014-03-21 DIAGNOSIS — M159 Polyosteoarthritis, unspecified: Secondary | ICD-10-CM | POA: Insufficient documentation

## 2014-03-21 DIAGNOSIS — M150 Primary generalized (osteo)arthritis: Secondary | ICD-10-CM

## 2014-03-21 DIAGNOSIS — M8949 Other hypertrophic osteoarthropathy, multiple sites: Secondary | ICD-10-CM

## 2014-03-21 MED ORDER — DULOXETINE HCL 60 MG PO CPEP: 60.0000 mg | ORAL_CAPSULE | Freq: Two times a day (BID) | ORAL | Status: DC

## 2014-03-21 NOTE — Patient Instructions (Signed)
Ok to try increase  The cymbalta to 120 mg    Caution when taking the  Tramadol  (Too much serotonin) May get some nausea when increasing . Kidney function is good . Continue to avoid  Simple sugars .  And simple breads  carbs    Whole wheat is better than    Other to avoid sugar elevation.

## 2014-03-21 NOTE — Progress Notes (Signed)
Pre visit review using our clinic review tool, if applicable. No additional management support is needed unless otherwise documented below in the visit note.  Chief Complaint  Patient presents with  . Follow-up    HPI: [name] [name] 73 y.o. for fu of   bllod sugar status and reanl function  Medication managnement   Last visit increased the Cymbalta   hasnt increased the  dose yet But wants to  Helps some with pain and anxiety   Had gi illness  At  t giving  .    diarreal  like  Had blood  on the outside of the stool feeling it could be hemorrhoid or rectal fissure at end of illness  currently has no problems is up-to-date on her colonoscopy last done 2 years ago.    only taking tramadol occasionally    blood pressure seems to be okay. ROS: See pertinent positives and negatives per HPI. no current new chest pain shortness of breath syncope injury. She has chronic pain with arthritis slightly better on the Cymbalta.   Past Medical History  Diagnosis Date  . Anxiety   . GERD (gastroesophageal reflux disease)   . Hyperlipidemia   . Hypertension   . Arthritis   . Chronic kidney disease     stones  . Osteopenia   . Hiatal hernia   . Diverticulosis of colon (without mention of hemorrhage)   . Allergy   . Cancer     Basal cell  . Cataract   . Depression   . ACUTE KIDNEY FAILURE UNSPECIFIED 07/12/2009  . CAD (coronary artery disease)     Mild nonobstructive by CT    Family History  Problem Relation Age of Onset  . Lung cancer Father     tobacco  . Kidney cancer Son   . Hypertension Brother     mom's side  . Alzheimer's disease Mother   . Osteoporosis Sister   . Breast cancer Cousin   . Colon cancer Neg Hx   . Heart disease Maternal Uncle     x 4  . Heart disease Maternal Aunt     x 4  . Diabetes Paternal Grandmother   . Diabetes Paternal Uncle     x 2    History   Social History  . Marital Status: Widowed    Spouse Name: N/A    Number of Children: 2  . Years  of Education: N/A   Occupational History  . Retired     Social History Main Topics  . Smoking status: Never Smoker   . Smokeless tobacco: Never Used  . Alcohol Use: Yes     Comment: rare  . Drug Use: No  . Sexual Activity: None   Other Topics Concern  . 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 amLODipine (NORVASC) 5 MG tablet TAKE 1 TABLET BY MOUTH EVERY DAY  . Cyanocobalamin (B-12 PO) Take by mouth.  . DULoxetine (CYMBALTA) 60 MG capsule Take 1 capsule (60 mg total) by mouth 2 (two) times daily. Total 90 mg per day  . metoprolol (LOPRESSOR) 50 MG tablet TAKE 1/2 TABLET BY MOUTH TWICE A DAY  . omeprazole (PRILOSEC) 20 MG capsule TAKE ONE CAPSULE BY MOUTH TWICE A DAY  . Probiotic Product (ALIGN PO) Take 1 tablet by mouth daily.  Marland Kitchen  rosuvastatin (CRESTOR) 40 MG tablet TAKE 1 TABLET BY MOUTH EVERY DAY  . tiZANidine (ZANAFLEX) 4 MG tablet   . traMADol (ULTRAM) 50 MG tablet Take 50 mg by mouth. Taking one at night.  . [DISCONTINUED] DULoxetine (CYMBALTA) 30 MG capsule Take 1 capsule (30 mg total) by mouth daily. With 60 mg total 90 mg per day  . [DISCONTINUED] DULoxetine (CYMBALTA) 60 MG capsule Take 1 capsule (60 mg total) by mouth daily. Total 90 mg per day    EXAM:  BP 128/80 mmHg  Temp(Src) 97.9 F (36.6 C) (Oral)  Ht 5' 1.5" (1.562 m)  Wt 181 lb 8 oz (82.328 kg)  BMI 33.74 kg/m2  Body mass index is 33.74 kg/(m^2).  GENERAL: vitals reviewed and listed above, alert, oriented, appears well hydrated and in no acute distress HEENT: atraumatic, conjunctiva  clear, no obvious abnormalities on inspection of external nose and earsNECK: no obvious masses on inspection palpation  LUNGS: clear to auscultation bilaterally, no wheezes, rales or rhonchi, good air movement CV: HRRR, no clubbing cyanosis or  peripheral edema nl cap refill    MS: moves all extremities without noticeable focal  abnormality PSYCH: pleasant and cooperative, no obvious depression   minimally anxious.  Lab Results  Component Value Date   WBC 4.8 04/26/2013   HGB 13.7 04/26/2013   HCT 40.8 04/26/2013   PLT 167.0 04/26/2013   GLUCOSE 124* 03/14/2014   CHOL 160 04/26/2013   TRIG 110.0 04/26/2013   HDL 71.00 04/26/2013   LDLDIRECT 179.1 10/09/2007   LDLCALC 67 04/26/2013   ALT 14 04/26/2013   AST 20 04/26/2013   NA 141 03/14/2014   K 4.5 03/14/2014   CL 107 03/14/2014   CREATININE 0.9 03/14/2014   BUN 16 03/14/2014   CO2 27 03/14/2014   TSH 3.09 04/26/2013   HGBA1C 6.0 03/14/2014    ASSESSMENT AND PLAN:  Discussed the following assessment and plan:  Primary osteoarthritis involving multiple joints  Medication management  CKD (chronic kidney disease), unspecified stage - cr stable .9   Hyperglycemia - prediabetic numbers  counseled diet andactivity   Adjustment disorder with mixed anxiety and depressed mood - stable at this time  rov in 4-6 months  Wellness  -Patient advised to return or notify health care team  if symptoms worsen ,persist or new concerns arise.  Patient Instructions  Ok to try increase  The cymbalta to 120 mg    Caution when taking the  Tramadol  (Too much serotonin) May get some nausea when increasing . Kidney function is good . Continue to avoid  Simple sugars .  And simple breads  carbs    Whole wheat is better than    Other to avoid sugar elevation.       Standley Brooking. Panosh M.D. Total visit 40mins > 50% spent counseling and coordinating care

## 2014-03-22 ENCOUNTER — Other Ambulatory Visit: Payer: Self-pay

## 2014-03-22 DIAGNOSIS — Z1231 Encounter for screening mammogram for malignant neoplasm of breast: Secondary | ICD-10-CM

## 2014-04-12 ENCOUNTER — Ambulatory Visit: Payer: Medicare Other

## 2014-04-28 ENCOUNTER — Ambulatory Visit: Payer: Medicare Other

## 2014-04-28 DIAGNOSIS — M19011 Primary osteoarthritis, right shoulder: Secondary | ICD-10-CM | POA: Diagnosis not present

## 2014-04-28 DIAGNOSIS — M1711 Unilateral primary osteoarthritis, right knee: Secondary | ICD-10-CM | POA: Diagnosis not present

## 2014-05-03 DIAGNOSIS — M19011 Primary osteoarthritis, right shoulder: Secondary | ICD-10-CM | POA: Diagnosis not present

## 2014-05-04 DIAGNOSIS — R05 Cough: Secondary | ICD-10-CM | POA: Diagnosis not present

## 2014-05-04 DIAGNOSIS — J40 Bronchitis, not specified as acute or chronic: Secondary | ICD-10-CM | POA: Diagnosis not present

## 2014-05-11 ENCOUNTER — Encounter: Payer: Self-pay | Admitting: Gastroenterology

## 2014-05-20 DIAGNOSIS — M19011 Primary osteoarthritis, right shoulder: Secondary | ICD-10-CM | POA: Diagnosis not present

## 2014-05-25 DIAGNOSIS — M19011 Primary osteoarthritis, right shoulder: Secondary | ICD-10-CM | POA: Diagnosis not present

## 2014-05-30 ENCOUNTER — Telehealth: Payer: Self-pay | Admitting: Internal Medicine

## 2014-05-30 NOTE — Telephone Encounter (Signed)
Pt states she is going to have shoulder replacement surgery w/ dr Veverly Fells @ [facility].  It cannot be scheduled until they hear back form [place].  Pt states info was sent several weeks ago. They have not heard for [place].  Pt would like you to give them a call , Caryl Never at [phone].

## 2014-05-30 NOTE — Telephone Encounter (Signed)
Just received  Today. Last visit 11 15 was stable and acceptable risk for surgery .

## 2014-05-30 NOTE — Telephone Encounter (Signed)
Paper placed on WP's desk.  Does pt require a 30 minute medical clearance appt?

## 2014-05-31 DIAGNOSIS — Z0279 Encounter for issue of other medical certificate: Secondary | ICD-10-CM

## 2014-05-31 NOTE — Telephone Encounter (Signed)
Sent to the front to be faxed.

## 2014-06-20 ENCOUNTER — Other Ambulatory Visit: Payer: Self-pay | Admitting: Internal Medicine

## 2014-06-20 DIAGNOSIS — J069 Acute upper respiratory infection, unspecified: Secondary | ICD-10-CM | POA: Diagnosis not present

## 2014-06-21 NOTE — Telephone Encounter (Signed)
Sent to the pharmacy by e-scribe. 

## 2014-06-28 ENCOUNTER — Other Ambulatory Visit: Payer: Medicare Other

## 2014-06-29 ENCOUNTER — Other Ambulatory Visit: Payer: Self-pay | Admitting: Internal Medicine

## 2014-06-29 NOTE — Telephone Encounter (Signed)
Patient has a wellness exam on 07/27/14

## 2014-07-05 ENCOUNTER — Encounter: Payer: Medicare Other | Admitting: Internal Medicine

## 2014-07-20 ENCOUNTER — Other Ambulatory Visit: Payer: Self-pay

## 2014-07-22 ENCOUNTER — Ambulatory Visit (INDEPENDENT_AMBULATORY_CARE_PROVIDER_SITE_OTHER): Payer: Medicare Other | Admitting: Internal Medicine

## 2014-07-22 ENCOUNTER — Encounter: Payer: Self-pay | Admitting: Internal Medicine

## 2014-07-22 ENCOUNTER — Ambulatory Visit (INDEPENDENT_AMBULATORY_CARE_PROVIDER_SITE_OTHER)
Admission: RE | Admit: 2014-07-22 | Discharge: 2014-07-22 | Disposition: A | Discharge status: Home / Self Care | Payer: Medicare Other | Source: Ambulatory Visit | Attending: Internal Medicine | Admitting: Internal Medicine

## 2014-07-22 VITALS — BP 106/64 | HR 74 | Temp 98.5°F | Ht 61.5 in | Wt 168.3 lb

## 2014-07-22 DIAGNOSIS — R490 Dysphonia: Secondary | ICD-10-CM | POA: Diagnosis not present

## 2014-07-22 DIAGNOSIS — R05 Cough: Secondary | ICD-10-CM | POA: Diagnosis not present

## 2014-07-22 DIAGNOSIS — J988 Other specified respiratory disorders: Secondary | ICD-10-CM | POA: Diagnosis not present

## 2014-07-22 MED ORDER — METOPROLOL TARTRATE 50 MG PO TABS: 25.0000 mg | ORAL_TABLET | Freq: Two times a day (BID) | ORAL | Status: DC

## 2014-07-22 MED ORDER — ALBUTEROL SULFATE HFA 108 (90 BASE) MCG/ACT IN AERS: 2.0000 | INHALATION_SPRAY | Freq: Four times a day (QID) | RESPIRATORY_TRACT | Status: DC | PRN

## 2014-07-22 MED ORDER — AZITHROMYCIN 250 MG PO TABS: 250.0000 mg | ORAL_TABLET | ORAL | Status: DC

## 2014-07-22 MED ORDER — DULOXETINE HCL 60 MG PO CPEP: 60.0000 mg | ORAL_CAPSULE | Freq: Two times a day (BID) | ORAL | Status: DC

## 2014-07-22 MED ORDER — PREDNISONE 20 MG PO TABS: ORAL_TABLET | ORAL | Status: DC

## 2014-07-22 NOTE — Patient Instructions (Addendum)
You have been treated at other  Places  For acute respiratory   Sx  Infection    Poss asthmatic bronchiti s  Get chest x ray today .  Consider   albuteral  inhaler .  Prednisone . Ok   Can add antibiotic if  Needed .  Plan pulmonary consults to get help with opinion on preventing this problem  Get  Records from urgent care .

## 2014-07-22 NOTE — Progress Notes (Signed)
Pre visit review using our clinic review tool, if applicable. No additional management support is needed unless otherwise documented below in the visit note.  Chief Complaint  Patient presents with  . Sore Throat  . Cough    X5Days says 4th tiem since december   . Nasal Congestion    hoarse  chest tight    HPI: [name] [name] 74 y.o. Patient [name] [name]  comes in today for SDA for  new problem evaluation. New onset last week but has had same problem 3 other times  And went to urgent care  Chest is sore  4th time  Since December .  Hit on Friday night and went to urgent care  Where Given prednisone and antibiotic.  And shot  Last med About 2 weeks ago . Feels coming back.  Records not available today  Hoarse  . ? If had flu and chills .  And also  Neg flu   Neg  Tobacco ets    ROS: See pertinent positives and negatives per HPI. No syncope to have shoulder suregeyr soon and concerned no fever chills some yellow thick phlegm had face pain one day not now and no pnd  Past Medical History  Diagnosis Date  . Anxiety   . GERD (gastroesophageal reflux disease)   . Hyperlipidemia   . Hypertension   . Arthritis   . Chronic kidney disease     stones  . Osteopenia   . Hiatal hernia   . Diverticulosis of colon (without mention of hemorrhage)   . Allergy   . Cancer     Basal cell  . Cataract   . Depression   . ACUTE KIDNEY FAILURE UNSPECIFIED 07/12/2009  . CAD (coronary artery disease)     Mild nonobstructive by CT    Family History  Problem Relation Age of Onset  . Lung cancer Father     tobacco  . Kidney cancer Son   . Hypertension Brother     mom's side  . Alzheimer's disease Mother   . Osteoporosis Sister   . Breast cancer Cousin   . Colon cancer Neg Hx   . Heart disease Maternal Uncle     x 4  . Heart disease Maternal Aunt     x 4  . Diabetes Paternal Grandmother   . Diabetes Paternal Uncle     x 2    History   Social History  . Marital Status: Widowed   Spouse Name: N/A  . Number of Children: 2  . Years of Education: N/A   Occupational History  . Retired     Social History Main Topics  . Smoking status: Never Smoker   . Smokeless tobacco: Never Used  . Alcohol Use: Yes     Comment: rare  . Drug Use: No  . Sexual Activity: Not on file   Other Topics Concern  . 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 amLODipine (NORVASC) 5 MG tablet TAKE 1 TABLET BY MOUTH EVERY DAY  . CRESTOR 40 MG tablet TAKE 1 TABLET BY MOUTH EVERY DAY  . Cyanocobalamin (B-12 PO) Take by mouth.  . DULoxetine (CYMBALTA) 60 MG capsule Take 1 capsule (60 mg total) by mouth 2 (two) times daily.  . metoprolol (LOPRESSOR) 50 MG tablet TAKE 1/2 TABLET BY MOUTH TWICE  A DAY  . metoprolol (LOPRESSOR) 50 MG tablet Take 0.5 tablets (25 mg total) by mouth 2 (two) times daily.  Marland Kitchen omeprazole (PRILOSEC) 20 MG capsule TAKE ONE CAPSULE BY MOUTH TWICE A DAY  . Probiotic Product (ALIGN PO) Take 1 tablet by mouth daily.  Marland Kitchen tiZANidine (ZANAFLEX) 4 MG tablet   . traMADol (ULTRAM) 50 MG tablet Take 50 mg by mouth. Taking one at night.  . [DISCONTINUED] DULoxetine (CYMBALTA) 60 MG capsule Take 1 capsule (60 mg total) by mouth 2 (two) times daily. Total 90 mg per day  . [DISCONTINUED] metoprolol (LOPRESSOR) 50 MG tablet TAKE 1/2 TABLET BY MOUTH TWICE A DAY  . albuterol (PROVENTIL HFA;VENTOLIN HFA) 108 (90 BASE) MCG/ACT inhaler Inhale 2 puffs into the lungs every 6 (six) hours as needed for wheezing or shortness of breath.  Marland Kitchen azithromycin (ZITHROMAX Z-PAK) 250 MG tablet Take 1 tablet (250 mg total) by mouth as directed. Take 2 po first day, then 1 po qd  . predniSONE (DELTASONE) 20 MG tablet Take 3 po qd for 2 days then 2 po qd for 3 days,or as directed    EXAM:  BP 106/64 mmHg  Pulse 74  Temp(Src) 98.5 F (36.9 C) (Oral)  Ht 5' 1.5"  (1.562 m)  Wt 168 lb 4.8 oz (76.34 kg)  BMI 31.29 kg/m2  SpO2 97%  Body mass index is 31.29 kg/(m^2).  GENERAL: vitals reviewed and listed above, alert, oriented, appears well hydrated and in no acute distress mild hoarseness  nostridor  HEENT: atraumatic, conjunctiva  clear, no obvious abnormalities on inspection of external nose and ears OP : no lesion edema or exudate  NECK: no obvious masses on inspection palpation  LUNGS: clear to auscultation bilaterally, no wheezes, rales or rhonchi, good air movement CV: HRRR, no clubbing cyanosis or  peripheral edema nl cap refill  MS: moves all extremities djd changes PSYCH: pleasant and cooperative, no obvious depression or anxiety    ASSESSMENT AND PLAN:  Discussed the following assessment and plan:  Recurrent respiratory infection - rx x 3 at urgen care with steroids and antibiotic ? if rad asthmatic  no obv sinus infection today  - Plan: DG Chest 2 View  Hoarseness - consider reflux allergy other also - Plan: DG Chest 2 View 4 episodes  And  Treated elsewhere  Has updoming shoulder surgery   c xray empiric albuteral pred  Add antibiotic only if needed ( today is Friday)  Get pulmonary input  Prevention etc  -Patient advised to return or notify health care team  if symptoms worsen ,persist or new concerns arise.  Patient Instructions  You have been treated at other  Places  For acute respiratory   Sx  Infection    Poss asthmatic bronchiti s  Get chest x ray today .  Consider   albuteral  inhaler .  Prednisone . Ok   Can add antibiotic if  Needed .  Plan pulmonary consults to get help with opinion on preventing this problem  Get  Records from urgent care .      Standley Brooking. Panosh M.D.

## 2014-07-24 DIAGNOSIS — R58 Hemorrhage, not elsewhere classified: Secondary | ICD-10-CM | POA: Diagnosis not present

## 2014-07-24 DIAGNOSIS — S60511A Abrasion of right hand, initial encounter: Secondary | ICD-10-CM | POA: Diagnosis not present

## 2014-07-26 ENCOUNTER — Institutional Professional Consult (permissible substitution): Payer: Medicare Other | Admitting: Internal Medicine

## 2014-07-27 ENCOUNTER — Encounter: Payer: Self-pay | Admitting: Internal Medicine

## 2014-07-28 ENCOUNTER — Other Ambulatory Visit: Payer: Self-pay | Admitting: Internal Medicine

## 2014-07-28 NOTE — H&P (Signed)
[name] [name] is an 74 y.o. female.    Chief Complaint: right shoulder pain and weakness  HPI: Pt is a 74 y.o. female complaining of right shoulder pain for multiple years. Pain had continually increased since the beginning. X-rays in the clinic show end-stage arthritic changes of the right shoulder. Pt has tried various conservative treatments which have failed to alleviate their symptoms, including injections and therapy. Various options are discussed with the patient. Risks, benefits and expectations were discussed with the patient. Patient understand the risks, benefits and expectations and wishes to proceed with surgery.   PCP:  Lottie Dawson, MD  D/C Plans:  Home  PMH: Past Medical History  Diagnosis Date  . Anxiety   . GERD (gastroesophageal reflux disease)   . Hyperlipidemia   . Hypertension   . Arthritis   . Chronic kidney disease     stones  . Osteopenia   . Hiatal hernia   . Diverticulosis of colon (without mention of hemorrhage)   . Allergy   . Cancer     Basal cell  . Cataract   . Depression   . ACUTE KIDNEY FAILURE UNSPECIFIED 07/12/2009  . CAD (coronary artery disease)     Mild nonobstructive by CT    PSH: Past Surgical History  Procedure Laterality Date  . Cholecystectomy    . Total knee arthroplasty      left  . Cystectomy  2001    benign  . Bladder tumor excision  2001  . Tendon repair  11/2005    right shoulder  . Lumbar disc surgery      x 2 [facility]  . Cataract extraction      bilateral  . Esophageal manometry N/A 07/13/2012    Procedure: ESOPHAGEAL MANOMETRY (EM);  Surgeon: Sable Feil, MD;  Location: [facility];  Service: Endoscopy;  Laterality: N/A;    Social History:  reports that she has never smoked. She has never used smokeless tobacco. She reports that she drinks alcohol. She reports that she does not use illicit drugs.  Allergies:  Allergies  Allergen Reactions  . Lactose Intolerance (Gi) Other (See Comments)    Gi  upset  . Meloxicam     REACTION: acute renal failure  with hypovolemic trigger Hospital  3 2011  . Sulfamethoxazole Hives    Medications: No current facility-administered medications for this encounter.   Current Outpatient Prescriptions  Medication Sig Dispense Refill  . albuterol (PROVENTIL HFA;VENTOLIN HFA) 108 (90 BASE) MCG/ACT inhaler Inhale 2 puffs into the lungs every 6 (six) hours as needed for wheezing or shortness of breath. 1 Inhaler 2  . amLODipine (NORVASC) 5 MG tablet TAKE 1 TABLET BY MOUTH EVERY DAY 90 tablet 3  . azithromycin (ZITHROMAX Z-PAK) 250 MG tablet Take 1 tablet (250 mg total) by mouth as directed. Take 2 po first day, then 1 po qd 6 tablet 0  . CRESTOR 40 MG tablet TAKE 1 TABLET BY MOUTH EVERY DAY 30 tablet 2  . Cyanocobalamin (B-12 PO) Take by mouth.    . DULoxetine (CYMBALTA) 60 MG capsule Take 1 capsule (60 mg total) by mouth 2 (two) times daily. 60 capsule 5  . metoprolol (LOPRESSOR) 50 MG tablet TAKE 1/2 TABLET BY MOUTH TWICE A DAY 90 tablet 1  . metoprolol (LOPRESSOR) 50 MG tablet Take 0.5 tablets (25 mg total) by mouth 2 (two) times daily. 90 tablet 1  . omeprazole (PRILOSEC) 20 MG capsule TAKE ONE CAPSULE BY MOUTH TWICE A DAY 180  capsule 1  . predniSONE (DELTASONE) 20 MG tablet Take 3 po qd for 2 days then 2 po qd for 3 days,or as directed 12 tablet 0  . Probiotic Product (ALIGN PO) Take 1 tablet by mouth daily.    Marland Kitchen tiZANidine (ZANAFLEX) 4 MG tablet     . traMADol (ULTRAM) 50 MG tablet Take 50 mg by mouth. Taking one at night.      No results found for this or any previous visit (from the past 48 hour(s)). No results found.  ROS: Pain with rom of the right upper extremity  Physical Exam:  Alert and oriented 74 y.o. female in no acute distress Cranial nerves 2-12 intact Cervical spine: full rom with no tenderness, nv intact distally Chest: active breath sounds bilaterally, no wheeze rhonchi or rales Heart: regular rate and rhythm, no  murmur Abd: non tender non distended with active bowel sounds Hip is stable with rom  Right shoulder with limited rom due to weakness and pain nv intact distally No rashes or edema Strength of ER and IR 3.5/5  Assessment/Plan Assessment: right shoulder rotator cuff insufficiency  Plan: Patient will undergo a right reverse total shoulder by Dr. Veverly Fells at [facility]. Risks benefits and expectations were discussed with the patient. Patient understand risks, benefits and expectations and wishes to proceed.

## 2014-07-29 ENCOUNTER — Other Ambulatory Visit: Payer: Self-pay | Admitting: Internal Medicine

## 2014-07-29 NOTE — Telephone Encounter (Signed)
Sent to the pharmacy by e-scribe. 

## 2014-08-03 ENCOUNTER — Encounter (HOSPITAL_COMMUNITY): Payer: Self-pay

## 2014-08-03 ENCOUNTER — Encounter (HOSPITAL_COMMUNITY)
Admission: RE | Admit: 2014-08-03 | Discharge: 2014-08-03 | Disposition: A | Discharge status: Home / Self Care | Payer: Medicare Other | Source: Ambulatory Visit | Attending: Orthopedic Surgery | Admitting: Orthopedic Surgery

## 2014-08-03 DIAGNOSIS — Z01810 Encounter for preprocedural cardiovascular examination: Secondary | ICD-10-CM | POA: Diagnosis not present

## 2014-08-03 DIAGNOSIS — Z01812 Encounter for preprocedural laboratory examination: Secondary | ICD-10-CM | POA: Diagnosis not present

## 2014-08-03 HISTORY — DX: Fibromyalgia: M79.7

## 2014-08-03 LAB — CBC
HCT: 42.6 % (ref 36.0–46.0)
Hemoglobin: 13.8 g/dL (ref 12.0–15.0)
MCH: 29.5 pg (ref 26.0–34.0)
MCHC: 32.4 g/dL (ref 30.0–36.0)
MCV: 91 fL (ref 78.0–100.0)
PLATELETS: 178 10*3/uL (ref 150–400)
RBC: 4.68 MIL/uL (ref 3.87–5.11)
RDW: 14.1 % (ref 11.5–15.5)
WBC: 7.9 10*3/uL (ref 4.0–10.5)

## 2014-08-03 LAB — BASIC METABOLIC PANEL
Anion gap: 8 (ref 5–15)
BUN: 15 mg/dL (ref 6–23)
CHLORIDE: 104 mmol/L (ref 96–112)
CO2: 27 mmol/L (ref 19–32)
Calcium: 9.6 mg/dL (ref 8.4–10.5)
Creatinine, Ser: 0.9 mg/dL (ref 0.50–1.10)
GFR, EST AFRICAN AMERICAN: 72 mL/min — AB (ref >90)
GFR, EST NON AFRICAN AMERICAN: 62 mL/min — AB (ref >90)
Glucose, Bld: 106 mg/dL — ABNORMAL HIGH (ref 70–99)
POTASSIUM: 4.3 mmol/L (ref 3.5–5.1)
SODIUM: 139 mmol/L (ref 135–145)

## 2014-08-03 LAB — SURGICAL PCR SCREEN
MRSA, PCR: NEGATIVE
STAPHYLOCOCCUS AUREUS: NEGATIVE

## 2014-08-03 NOTE — Pre-Procedure Instructions (Signed)
[name] [name]  08/03/2014   Your procedure is scheduled on:  April 22nd, Friday.   Report to [facility] Admitting at 5:30 AM.   Call this number if you have problems the morning of surgery: [phone]   Remember:   Do not eat food or drink liquids after midnight Thursday.    Take these medicines the morning of surgery with A SIP OF WATER: Norvasc, Cymbalta, Metoprolol, Omeprazole, Tramadol if needed.   Do not wear jewelry, make-up or nail polish.  Do not wear lotions, powders, or perfumes. You may NOT wear deodorant the day of surgery.  Do not shave 48 hours prior to surgery. .  Do not bring valuables to the hospital.  [facility] is not responsible for any belongings or valuables.               Contacts, dentures or bridgework may not be worn into surgery.  Leave suitcase in the car. After surgery it may be brought to your room.  For patients admitted to the hospital, discharge time is determined by your treatment team.    Name and phone number of your driver:    Special Instructions: "Preparing for Surgery" instruction sheet.   Please read over the following fact sheets that you were given: Pain Booklet, Coughing and Deep Breathing, MRSA Information and Surgical Site Infection Prevention

## 2014-08-03 NOTE — Progress Notes (Signed)
Back in 2014, patient was experiencing some mid 'deep' pain.  Work up revealed that she had an esophageal spasm.  Had seen Dr. Percival Spanish.  LOV with him was back in Dec. 2014, and has had no further symptoms or concerns. DA

## 2014-08-11 MED ORDER — CHLORHEXIDINE GLUCONATE 4 % EX LIQD
60.0000 mL | Freq: Once | CUTANEOUS | Status: DC
  Filled 2014-08-11: qty 60

## 2014-08-11 MED ORDER — CEFAZOLIN SODIUM-DEXTROSE 2-3 GM-% IV SOLR
2.0000 g | INTRAVENOUS | Status: AC
Start: 2014-08-12 — End: 2014-08-12
  Administered 2014-08-12: 2 g via INTRAVENOUS
  Filled 2014-08-11: qty 50

## 2014-08-12 ENCOUNTER — Encounter (HOSPITAL_COMMUNITY)
Admission: RE | Disposition: A | Discharge status: Skilled Nursing Facility | Payer: Self-pay | Source: Ambulatory Visit | Attending: Orthopedic Surgery

## 2014-08-12 ENCOUNTER — Inpatient Hospital Stay (HOSPITAL_COMMUNITY)
Admission: RE | Admit: 2014-08-12 | Discharge: 2014-08-15 | DRG: 483 | Disposition: A | Discharge status: Skilled Nursing Facility | Payer: Medicare Other | Source: Ambulatory Visit | Attending: Orthopedic Surgery | Admitting: Orthopedic Surgery

## 2014-08-12 ENCOUNTER — Inpatient Hospital Stay (HOSPITAL_COMMUNITY): Payer: Medicare Other | Admitting: Certified Registered"

## 2014-08-12 ENCOUNTER — Encounter (HOSPITAL_COMMUNITY): Payer: Self-pay | Admitting: Surgery

## 2014-08-12 ENCOUNTER — Inpatient Hospital Stay (HOSPITAL_COMMUNITY): Payer: Medicare Other

## 2014-08-12 DIAGNOSIS — M19011 Primary osteoarthritis, right shoulder: Principal | ICD-10-CM | POA: Diagnosis present

## 2014-08-12 DIAGNOSIS — G8918 Other acute postprocedural pain: Secondary | ICD-10-CM | POA: Diagnosis not present

## 2014-08-12 DIAGNOSIS — Z96611 Presence of right artificial shoulder joint: Secondary | ICD-10-CM

## 2014-08-12 DIAGNOSIS — F419 Anxiety disorder, unspecified: Secondary | ICD-10-CM | POA: Diagnosis not present

## 2014-08-12 DIAGNOSIS — M25511 Pain in right shoulder: Secondary | ICD-10-CM | POA: Diagnosis present

## 2014-08-12 DIAGNOSIS — Z96619 Presence of unspecified artificial shoulder joint: Secondary | ICD-10-CM

## 2014-08-12 DIAGNOSIS — M75101 Unspecified rotator cuff tear or rupture of right shoulder, not specified as traumatic: Secondary | ICD-10-CM | POA: Diagnosis present

## 2014-08-12 DIAGNOSIS — M75111 Incomplete rotator cuff tear or rupture of right shoulder, not specified as traumatic: Secondary | ICD-10-CM | POA: Diagnosis not present

## 2014-08-12 DIAGNOSIS — E785 Hyperlipidemia, unspecified: Secondary | ICD-10-CM | POA: Diagnosis not present

## 2014-08-12 DIAGNOSIS — I2510 Atherosclerotic heart disease of native coronary artery without angina pectoris: Secondary | ICD-10-CM | POA: Diagnosis not present

## 2014-08-12 DIAGNOSIS — Z471 Aftercare following joint replacement surgery: Secondary | ICD-10-CM | POA: Diagnosis not present

## 2014-08-12 DIAGNOSIS — I10 Essential (primary) hypertension: Secondary | ICD-10-CM | POA: Diagnosis not present

## 2014-08-12 DIAGNOSIS — K219 Gastro-esophageal reflux disease without esophagitis: Secondary | ICD-10-CM | POA: Diagnosis not present

## 2014-08-12 DIAGNOSIS — R2681 Unsteadiness on feet: Secondary | ICD-10-CM | POA: Diagnosis not present

## 2014-08-12 DIAGNOSIS — I129 Hypertensive chronic kidney disease with stage 1 through stage 4 chronic kidney disease, or unspecified chronic kidney disease: Secondary | ICD-10-CM | POA: Diagnosis present

## 2014-08-12 DIAGNOSIS — M6281 Muscle weakness (generalized): Secondary | ICD-10-CM | POA: Diagnosis not present

## 2014-08-12 DIAGNOSIS — R278 Other lack of coordination: Secondary | ICD-10-CM | POA: Diagnosis not present

## 2014-08-12 DIAGNOSIS — N189 Chronic kidney disease, unspecified: Secondary | ICD-10-CM | POA: Diagnosis not present

## 2014-08-12 DIAGNOSIS — F329 Major depressive disorder, single episode, unspecified: Secondary | ICD-10-CM | POA: Diagnosis not present

## 2014-08-12 DIAGNOSIS — M797 Fibromyalgia: Secondary | ICD-10-CM | POA: Diagnosis not present

## 2014-08-12 HISTORY — PX: REVERSE SHOULDER ARTHROPLASTY: SHX5054

## 2014-08-12 SURGERY — ARTHROPLASTY, SHOULDER, TOTAL, REVERSE
Anesthesia: General | Site: Shoulder | Laterality: Right

## 2014-08-12 MED ORDER — ACETAMINOPHEN 325 MG PO TABS
650.0000 mg | ORAL_TABLET | Freq: Four times a day (QID) | ORAL | Status: DC | PRN
Start: 2014-08-12 — End: 2014-08-15

## 2014-08-12 MED ORDER — GLYCOPYRROLATE 0.2 MG/ML IJ SOLN
INTRAMUSCULAR | Status: DC | PRN
  Administered 2014-08-12: 0.2 mg via INTRAVENOUS

## 2014-08-12 MED ORDER — PANTOPRAZOLE SODIUM 40 MG PO TBEC
80.0000 mg | DELAYED_RELEASE_TABLET | Freq: Every day | ORAL | Status: DC
  Administered 2014-08-12 – 2014-08-15 (×4): 80 mg via ORAL
  Filled 2014-08-12 (×5): qty 2

## 2014-08-12 MED ORDER — DULOXETINE HCL 60 MG PO CPEP
60.0000 mg | ORAL_CAPSULE | Freq: Two times a day (BID) | ORAL | Status: DC
  Administered 2014-08-12 – 2014-08-15 (×6): 60 mg via ORAL
  Filled 2014-08-12 (×4): qty 1
  Filled 2014-08-12: qty 2
  Filled 2014-08-12: qty 1

## 2014-08-12 MED ORDER — ROCURONIUM BROMIDE 100 MG/10ML IV SOLN
INTRAVENOUS | Status: DC | PRN
  Administered 2014-08-12: 10 mg via INTRAVENOUS

## 2014-08-12 MED ORDER — OXYCODONE HCL 5 MG PO TABS
5.0000 mg | ORAL_TABLET | ORAL | Status: DC | PRN
  Administered 2014-08-12: 5 mg via ORAL
  Administered 2014-08-12 – 2014-08-15 (×9): 10 mg via ORAL
  Filled 2014-08-12 (×6): qty 2
  Filled 2014-08-12: qty 1
  Filled 2014-08-12 (×3): qty 2

## 2014-08-12 MED ORDER — ACETAMINOPHEN 650 MG RE SUPP: 650.0000 mg | Freq: Four times a day (QID) | RECTAL | Status: DC | PRN

## 2014-08-12 MED ORDER — MIDAZOLAM HCL 2 MG/2ML IJ SOLN
INTRAMUSCULAR | Status: AC
  Filled 2014-08-12: qty 2

## 2014-08-12 MED ORDER — BISACODYL 10 MG RE SUPP: 10.0000 mg | Freq: Every day | RECTAL | Status: DC | PRN

## 2014-08-12 MED ORDER — LIDOCAINE HCL (CARDIAC) 20 MG/ML IV SOLN
INTRAVENOUS | Status: AC
  Filled 2014-08-12: qty 5

## 2014-08-12 MED ORDER — ARTIFICIAL TEARS OP OINT
TOPICAL_OINTMENT | OPHTHALMIC | Status: AC
  Filled 2014-08-12: qty 3.5

## 2014-08-12 MED ORDER — EPHEDRINE SULFATE 50 MG/ML IJ SOLN
INTRAMUSCULAR | Status: AC
  Filled 2014-08-12: qty 1

## 2014-08-12 MED ORDER — MORPHINE SULFATE 2 MG/ML IJ SOLN
2.0000 mg | INTRAMUSCULAR | Status: DC | PRN
  Administered 2014-08-13 (×2): 2 mg via INTRAVENOUS
  Filled 2014-08-12 (×2): qty 1

## 2014-08-12 MED ORDER — CEFAZOLIN SODIUM-DEXTROSE 2-3 GM-% IV SOLR
2.0000 g | Freq: Four times a day (QID) | INTRAVENOUS | Status: AC
  Administered 2014-08-12 – 2014-08-13 (×3): 2 g via INTRAVENOUS
  Filled 2014-08-12 (×3): qty 50

## 2014-08-12 MED ORDER — TIZANIDINE HCL 4 MG PO TABS
4.0000 mg | ORAL_TABLET | Freq: Three times a day (TID) | ORAL | Status: DC | PRN
  Administered 2014-08-13: 4 mg via ORAL
  Filled 2014-08-12: qty 1

## 2014-08-12 MED ORDER — ARTIFICIAL TEARS OP OINT
TOPICAL_OINTMENT | OPHTHALMIC | Status: DC | PRN
  Administered 2014-08-12: 1 via OPHTHALMIC

## 2014-08-12 MED ORDER — MENTHOL 3 MG MT LOZG: 1.0000 | LOZENGE | OROMUCOSAL | Status: DC | PRN

## 2014-08-12 MED ORDER — BUPIVACAINE-EPINEPHRINE 0.25% -1:200000 IJ SOLN
INTRAMUSCULAR | Status: DC | PRN
  Administered 2014-08-12: 7 mL

## 2014-08-12 MED ORDER — BUPIVACAINE-EPINEPHRINE (PF) 0.5% -1:200000 IJ SOLN
INTRAMUSCULAR | Status: DC | PRN
  Administered 2014-08-12: 25 mL via PERINEURAL

## 2014-08-12 MED ORDER — TRAMADOL HCL 50 MG PO TABS
50.0000 mg | ORAL_TABLET | Freq: Four times a day (QID) | ORAL | Status: DC | PRN
  Administered 2014-08-13: 50 mg via ORAL
  Filled 2014-08-12: qty 1

## 2014-08-12 MED ORDER — MIDAZOLAM HCL 5 MG/5ML IJ SOLN
INTRAMUSCULAR | Status: DC | PRN
  Administered 2014-08-12: 2 mg via INTRAVENOUS

## 2014-08-12 MED ORDER — HYDROMORPHONE HCL 1 MG/ML IJ SOLN
0.2500 mg | INTRAMUSCULAR | Status: DC | PRN
  Administered 2014-08-12: 0.25 mg via INTRAVENOUS

## 2014-08-12 MED ORDER — FENTANYL CITRATE (PF) 250 MCG/5ML IJ SOLN
INTRAMUSCULAR | Status: AC
  Filled 2014-08-12: qty 5

## 2014-08-12 MED ORDER — LIDOCAINE HCL (CARDIAC) 20 MG/ML IV SOLN
INTRAVENOUS | Status: DC | PRN
  Administered 2014-08-12: 40 mg via INTRAVENOUS

## 2014-08-12 MED ORDER — ROCURONIUM BROMIDE 50 MG/5ML IV SOLN
INTRAVENOUS | Status: AC
  Filled 2014-08-12: qty 1

## 2014-08-12 MED ORDER — DEXAMETHASONE SODIUM PHOSPHATE 10 MG/ML IJ SOLN
INTRAMUSCULAR | Status: DC | PRN
  Administered 2014-08-12: 10 mg via INTRAVENOUS

## 2014-08-12 MED ORDER — STERILE WATER FOR INJECTION IJ SOLN
INTRAMUSCULAR | Status: AC
  Filled 2014-08-12: qty 10

## 2014-08-12 MED ORDER — OXYCODONE-ACETAMINOPHEN 5-325 MG PO TABS: 1.0000 | ORAL_TABLET | ORAL | Status: DC | PRN

## 2014-08-12 MED ORDER — METOCLOPRAMIDE HCL 5 MG/ML IJ SOLN: 5.0000 mg | Freq: Three times a day (TID) | INTRAMUSCULAR | Status: DC | PRN

## 2014-08-12 MED ORDER — ONDANSETRON HCL 4 MG PO TABS: 4.0000 mg | ORAL_TABLET | Freq: Four times a day (QID) | ORAL | Status: DC | PRN

## 2014-08-12 MED ORDER — AMLODIPINE BESYLATE 5 MG PO TABS
5.0000 mg | ORAL_TABLET | Freq: Every day | ORAL | Status: DC
  Administered 2014-08-12 – 2014-08-14 (×3): 5 mg via ORAL
  Filled 2014-08-12 (×4): qty 1

## 2014-08-12 MED ORDER — SUCCINYLCHOLINE CHLORIDE 20 MG/ML IJ SOLN
INTRAMUSCULAR | Status: DC | PRN
  Administered 2014-08-12: 100 mg via INTRAVENOUS

## 2014-08-12 MED ORDER — PHENYLEPHRINE HCL 10 MG/ML IJ SOLN
10.0000 mg | INTRAMUSCULAR | Status: DC | PRN
  Administered 2014-08-12: 25 ug/min via INTRAVENOUS

## 2014-08-12 MED ORDER — NEOSTIGMINE METHYLSULFATE 10 MG/10ML IV SOLN
INTRAVENOUS | Status: DC | PRN
  Administered 2014-08-12: 1 mg via INTRAVENOUS

## 2014-08-12 MED ORDER — ALBUTEROL SULFATE (2.5 MG/3ML) 0.083% IN NEBU: 2.5000 mg | INHALATION_SOLUTION | Freq: Four times a day (QID) | RESPIRATORY_TRACT | Status: DC | PRN

## 2014-08-12 MED ORDER — PHENOL 1.4 % MT LIQD: 1.0000 | OROMUCOSAL | Status: DC | PRN

## 2014-08-12 MED ORDER — HYDROMORPHONE HCL 1 MG/ML IJ SOLN
INTRAMUSCULAR | Status: AC
  Filled 2014-08-12: qty 1

## 2014-08-12 MED ORDER — POLYETHYLENE GLYCOL 3350 17 G PO PACK: 17.0000 g | PACK | Freq: Every day | ORAL | Status: DC | PRN

## 2014-08-12 MED ORDER — PHENYLEPHRINE 40 MCG/ML (10ML) SYRINGE FOR IV PUSH (FOR BLOOD PRESSURE SUPPORT)
PREFILLED_SYRINGE | INTRAVENOUS | Status: AC
  Filled 2014-08-12: qty 10

## 2014-08-12 MED ORDER — LACTATED RINGERS IV SOLN
INTRAVENOUS | Status: DC | PRN
  Administered 2014-08-12 (×2): via INTRAVENOUS

## 2014-08-12 MED ORDER — PROPOFOL 10 MG/ML IV BOLUS
INTRAVENOUS | Status: AC
  Filled 2014-08-12: qty 20

## 2014-08-12 MED ORDER — METOCLOPRAMIDE HCL 5 MG PO TABS
5.0000 mg | ORAL_TABLET | Freq: Three times a day (TID) | ORAL | Status: DC | PRN
Start: 2014-08-12 — End: 2014-08-15

## 2014-08-12 MED ORDER — ONDANSETRON HCL 4 MG/2ML IJ SOLN: 4.0000 mg | Freq: Four times a day (QID) | INTRAMUSCULAR | Status: DC | PRN

## 2014-08-12 MED ORDER — 0.9 % SODIUM CHLORIDE (POUR BTL) OPTIME
TOPICAL | Status: DC | PRN
  Administered 2014-08-12: 1000 mL

## 2014-08-12 MED ORDER — SODIUM CHLORIDE 0.9 % IV SOLN
INTRAVENOUS | Status: DC
  Administered 2014-08-12: 23:00:00 via INTRAVENOUS

## 2014-08-12 MED ORDER — AMOXICILLIN-POT CLAVULANATE 875-125 MG PO TABS: 1.0000 | ORAL_TABLET | Freq: Two times a day (BID) | ORAL | Status: DC

## 2014-08-12 MED ORDER — PHENYLEPHRINE HCL 10 MG/ML IJ SOLN
INTRAMUSCULAR | Status: DC | PRN
  Administered 2014-08-12 (×4): 80 ug via INTRAVENOUS
  Administered 2014-08-12 (×2): 40 ug via INTRAVENOUS

## 2014-08-12 MED ORDER — ONDANSETRON HCL 4 MG/2ML IJ SOLN: 4.0000 mg | Freq: Once | INTRAMUSCULAR | Status: DC | PRN

## 2014-08-12 MED ORDER — BUPIVACAINE-EPINEPHRINE (PF) 0.25% -1:200000 IJ SOLN
INTRAMUSCULAR | Status: AC
  Filled 2014-08-12: qty 30

## 2014-08-12 MED ORDER — FENTANYL CITRATE (PF) 100 MCG/2ML IJ SOLN
INTRAMUSCULAR | Status: DC | PRN
  Administered 2014-08-12 (×2): 50 ug via INTRAVENOUS
  Administered 2014-08-12: 75 ug via INTRAVENOUS

## 2014-08-12 MED ORDER — PROPOFOL 10 MG/ML IV BOLUS
INTRAVENOUS | Status: DC | PRN
  Administered 2014-08-12: 150 mg via INTRAVENOUS

## 2014-08-12 MED ORDER — EPHEDRINE SULFATE 50 MG/ML IJ SOLN
INTRAMUSCULAR | Status: DC | PRN
  Administered 2014-08-12: 5 mg via INTRAVENOUS
  Administered 2014-08-12 (×2): 10 mg via INTRAVENOUS
  Administered 2014-08-12: 5 mg via INTRAVENOUS

## 2014-08-12 MED ORDER — MEPERIDINE HCL 25 MG/ML IJ SOLN: 6.2500 mg | INTRAMUSCULAR | Status: DC | PRN

## 2014-08-12 MED ORDER — SUCCINYLCHOLINE CHLORIDE 20 MG/ML IJ SOLN
INTRAMUSCULAR | Status: AC
  Filled 2014-08-12: qty 1

## 2014-08-12 MED ORDER — METOPROLOL TARTRATE 25 MG PO TABS
25.0000 mg | ORAL_TABLET | Freq: Two times a day (BID) | ORAL | Status: DC
  Administered 2014-08-12 – 2014-08-14 (×5): 25 mg via ORAL
  Filled 2014-08-12 (×6): qty 1

## 2014-08-12 MED ORDER — ONDANSETRON HCL 4 MG/2ML IJ SOLN
INTRAMUSCULAR | Status: AC
  Filled 2014-08-12: qty 2

## 2014-08-12 MED ORDER — ROSUVASTATIN CALCIUM 10 MG PO TABS
40.0000 mg | ORAL_TABLET | Freq: Every day | ORAL | Status: DC
  Administered 2014-08-12 – 2014-08-14 (×3): 40 mg via ORAL
  Filled 2014-08-12 (×3): qty 4

## 2014-08-12 MED ORDER — ALBUTEROL SULFATE HFA 108 (90 BASE) MCG/ACT IN AERS: 2.0000 | INHALATION_SPRAY | Freq: Four times a day (QID) | RESPIRATORY_TRACT | Status: DC | PRN

## 2014-08-12 MED ORDER — TIZANIDINE HCL 4 MG PO TABS: 4.0000 mg | ORAL_TABLET | Freq: Four times a day (QID) | ORAL | Status: DC | PRN

## 2014-08-12 SURGICAL SUPPLY — 67 items
BIT DRILL 170X2.5X (BIT) IMPLANT
BIT DRILL 5/64X5 DISP (BIT) ×2 IMPLANT
BIT DRL 170X2.5X (BIT)
BLADE SAG 18X100X1.27 (BLADE) ×2 IMPLANT
CAPT SHLDR REVTOTAL 1 ×1 IMPLANT
CLSR STERI-STRIP ANTIMIC 1/2X4 (GAUZE/BANDAGES/DRESSINGS) ×1 IMPLANT
COVER SURGICAL LIGHT HANDLE (MISCELLANEOUS) ×2 IMPLANT
DRAPE INCISE IOBAN 66X45 STRL (DRAPES) ×4 IMPLANT
DRAPE U-SHAPE 47X51 STRL (DRAPES) ×2 IMPLANT
DRAPE X-RAY CASS 24X20 (DRAPES) IMPLANT
DRILL 2.5 (BIT)
DRSG ADAPTIC 3X8 NADH LF (GAUZE/BANDAGES/DRESSINGS) ×2 IMPLANT
DRSG PAD ABDOMINAL 8X10 ST (GAUZE/BANDAGES/DRESSINGS) ×2 IMPLANT
DURAPREP 26ML APPLICATOR (WOUND CARE) ×2 IMPLANT
ELECT BLADE 4.0 EZ CLEAN MEGAD (MISCELLANEOUS) ×2
ELECT NDL TIP 2.8 STRL (NEEDLE) ×1 IMPLANT
ELECT NEEDLE TIP 2.8 STRL (NEEDLE) IMPLANT
ELECT REM PT RETURN 9FT ADLT (ELECTROSURGICAL) ×2
ELECTRODE BLDE 4.0 EZ CLN MEGD (MISCELLANEOUS) ×1 IMPLANT
ELECTRODE REM PT RTRN 9FT ADLT (ELECTROSURGICAL) ×1 IMPLANT
GAUZE SPONGE 4X4 12PLY STRL (GAUZE/BANDAGES/DRESSINGS) ×2 IMPLANT
GLENO PIN IMPLANT
GLOVE BIOGEL PI ORTHO PRO 7.5 (GLOVE) ×1
GLOVE BIOGEL PI ORTHO PRO SZ8 (GLOVE) ×1
GLOVE ORTHO TXT STRL SZ7.5 (GLOVE) ×2 IMPLANT
GLOVE PI ORTHO PRO STRL 7.5 (GLOVE) ×1 IMPLANT
GLOVE PI ORTHO PRO STRL SZ8 (GLOVE) ×1 IMPLANT
GLOVE SURG ORTHO 8.5 STRL (GLOVE) ×2 IMPLANT
GOWN STRL REUS W/ TWL LRG LVL3 (GOWN DISPOSABLE) ×1 IMPLANT
GOWN STRL REUS W/ TWL XL LVL3 (GOWN DISPOSABLE) ×2 IMPLANT
GOWN STRL REUS W/TWL LRG LVL3 (GOWN DISPOSABLE) ×4
GOWN STRL REUS W/TWL XL LVL3 (GOWN DISPOSABLE) ×4
HANDPIECE INTERPULSE COAX TIP (DISPOSABLE)
KIT BASIN OR (CUSTOM PROCEDURE TRAY) ×2 IMPLANT
KIT ROOM TURNOVER OR (KITS) ×2 IMPLANT
MANIFOLD NEPTUNE II (INSTRUMENTS) ×2 IMPLANT
NDL 1/2 CIR MAYO (NEEDLE) ×1 IMPLANT
NDL HYPO 25GX1X1/2 BEV (NEEDLE) ×1 IMPLANT
NEEDLE 1/2 CIR MAYO (NEEDLE) IMPLANT
NEEDLE HYPO 25GX1X1/2 BEV (NEEDLE) ×2 IMPLANT
NS IRRIG 1000ML POUR BTL (IV SOLUTION) ×2 IMPLANT
PACK SHOULDER (CUSTOM PROCEDURE TRAY) ×2 IMPLANT
PAD ARMBOARD 7.5X6 YLW CONV (MISCELLANEOUS) ×4 IMPLANT
PIN GUIDE 1.2 (PIN) IMPLANT
PIN METAGLENE 2.5 (PIN) IMPLANT
SET HNDPC FAN SPRY TIP SCT (DISPOSABLE) IMPLANT
SLING ARM IMMOBILIZER LRG (SOFTGOODS) ×1 IMPLANT
SLING ARM LRG ADULT FOAM STRAP (SOFTGOODS) IMPLANT
SLING ARM MED ADULT FOAM STRAP (SOFTGOODS) IMPLANT
SPONGE LAP 18X18 X RAY DECT (DISPOSABLE) ×1 IMPLANT
SPONGE LAP 4X18 X RAY DECT (DISPOSABLE) ×2 IMPLANT
STRIP CLOSURE SKIN 1/2X4 (GAUZE/BANDAGES/DRESSINGS) ×1 IMPLANT
SUCTION FRAZIER TIP 10 FR DISP (SUCTIONS) ×2 IMPLANT
SUT FIBERWIRE #2 38 T-5 BLUE (SUTURE) ×10
SUT MNCRL AB 4-0 PS2 18 (SUTURE) ×2 IMPLANT
SUT VIC AB 0 CT1 27 (SUTURE) ×2
SUT VIC AB 0 CT1 27XBRD ANBCTR (SUTURE) IMPLANT
SUT VIC AB 2-0 CT1 27 (SUTURE) ×2
SUT VIC AB 2-0 CT1 TAPERPNT 27 (SUTURE) ×1 IMPLANT
SUTURE FIBERWR #2 38 T-5 BLUE (SUTURE) ×2 IMPLANT
SYR CONTROL 10ML LL (SYRINGE) ×2 IMPLANT
TOWEL OR 17X24 6PK STRL BLUE (TOWEL DISPOSABLE) ×2 IMPLANT
TOWEL OR 17X26 10 PK STRL BLUE (TOWEL DISPOSABLE) ×2 IMPLANT
TOWER CARTRIDGE SMART MIX (DISPOSABLE) IMPLANT
TRAY FOLEY CATH 16FRSI W/METER (SET/KITS/TRAYS/PACK) ×1 IMPLANT
WATER STERILE IRR 1000ML POUR (IV SOLUTION) ×2 IMPLANT
YANKAUER SUCT BULB TIP NO VENT (SUCTIONS) ×1 IMPLANT

## 2014-08-12 NOTE — Transfer of Care (Signed)
Immediate Anesthesia Transfer of Care Note  Patient: [name] [name]  Procedure(s) Performed: Procedure(s): RIGHT REVERSE TOTAL SHOULDER ARTHROPLASTY (Right)  Patient Location: PACU  Anesthesia Type:General and GA combined with regional for post-op pain  Level of Consciousness: awake, alert  and oriented  Airway & Oxygen Therapy: Patient Spontanous Breathing and Patient connected to nasal cannula oxygen  Post-op Assessment: Report given to RN and Post -op Vital signs reviewed and stable  Post vital signs: Reviewed and stable  Last Vitals:  Filed Vitals:   08/12/14 0705  BP:   Pulse:   Temp:   Resp: 16    Complications: No apparent anesthesia complications

## 2014-08-12 NOTE — Anesthesia Postprocedure Evaluation (Signed)
Anesthesia Post Note  Patient: [name] [name]  Procedure(s) Performed: Procedure(s) (LRB): RIGHT REVERSE TOTAL SHOULDER ARTHROPLASTY (Right)  Anesthesia type: general  Patient location: PACU  Post pain: Pain level controlled  Post assessment: Patient's Cardiovascular Status Stable  Last Vitals:  Filed Vitals:   08/12/14 1045  BP: 120/59  Pulse: 83  Temp: 36.3 C  Resp: 20    Post vital signs: Reviewed and stable  Level of consciousness: sedated  Complications: No apparent anesthesia complications

## 2014-08-12 NOTE — Interval H&P Note (Signed)
History and Physical Interval Note:  08/12/2014 7:27 AM  [name] [name]  has presented today for surgery, with the diagnosis of right shoulder osteoarthitis  The various methods of treatment have been discussed with the patient and family. After consideration of risks, benefits and other options for treatment, the patient has consented to  Procedure(s): RIGHT REVERSE TOTAL SHOULDER ARTHROPLASTY (Right) as a surgical intervention .  The patient's history has been reviewed, patient examined, no change in status, stable for surgery.  I have reviewed the patient's chart and labs.  Questions were answered to the patient's satisfaction.     Jovana Rembold,STEVEN R

## 2014-08-12 NOTE — Brief Op Note (Signed)
08/12/2014  9:47 AM  PATIENT:  [name] [name]  74 y.o. female  PRE-OPERATIVE DIAGNOSIS:  right shoulder osteoarthitis, rotator cuff tear arthropathy  POST-OPERATIVE DIAGNOSIS:  right shoulder osteoarthitis, rotator cuff tear arthropathy  PROCEDURE:  Procedure(s): RIGHT REVERSE TOTAL SHOULDER ARTHROPLASTY (Right), Delta Xtend, DePuy  SURGEON:  Surgeon(s) and Role:    * Netta Cedars, MD - Primary  PHYSICIAN ASSISTANT:   ASSISTANTS: Ventura Bruns, PA-C   ANESTHESIA:   regional and general  EBL:     BLOOD ADMINISTERED:none  DRAINS: none   LOCAL MEDICATIONS USED:  MARCAINE     SPECIMEN:  No Specimen  DISPOSITION OF SPECIMEN:  N/A  COUNTS:  YES  TOURNIQUET:  * No tourniquets in log *  DICTATION: .Other Dictation: Dictation Number [id number]  PLAN OF CARE: PACU  PATIENT DISPOSITION:  PACU - hemodynamically stable.   Delay start of Pharmacological VTE agent (>24hrs) due to surgical blood loss or risk of bleeding: not applicable

## 2014-08-12 NOTE — Anesthesia Preprocedure Evaluation (Addendum)
Anesthesia Evaluation  Patient identified by MRN, date of birth, ID band Patient awake    Reviewed: Allergy & Precautions, NPO status , Patient's Chart, lab work & pertinent test results  Airway Mallampati: I  TM Distance: >3 FB Neck ROM: Full    Dental   Pulmonary    Pulmonary exam normal       Cardiovascular hypertension, Pt. on medications     Neuro/Psych    GI/Hepatic GERD-  Controlled,  Endo/Other    Renal/GU CRFRenal disease     Musculoskeletal   Abdominal   Peds  Hematology   Anesthesia Other Findings   Reproductive/Obstetrics                            Anesthesia Physical Anesthesia Plan  ASA: III  Anesthesia Plan: General   Post-op Pain Management:    Induction: Intravenous  Airway Management Planned: Oral ETT  Additional Equipment:   Intra-op Plan:   Post-operative Plan: Extubation in OR  Informed Consent: I have reviewed the patients History and Physical, chart, labs and discussed the procedure including the risks, benefits and alternatives for the proposed anesthesia with the patient or authorized representative who has indicated his/her understanding and acceptance.     Plan Discussed with: CRNA and Surgeon  Anesthesia Plan Comments:         Anesthesia Quick Evaluation

## 2014-08-12 NOTE — Discharge Instructions (Signed)
Keep the incision covered and clean and dry for one week, then ok to shower.  Ok to use the right shoulder and arm for light daily activities.  No pushing out of a chair.  Use the sling while up walking around, otherwise can remove  Limit weight bearing with the right shoulder.    Follow up in two weeks with Dr Veverly Fells  [phone]

## 2014-08-12 NOTE — Progress Notes (Signed)
Utilization review completed.  

## 2014-08-12 NOTE — Progress Notes (Signed)
On hold for room

## 2014-08-12 NOTE — Anesthesia Procedure Notes (Addendum)
Anesthesia Regional Block:  Interscalene brachial plexus block  Pre-Anesthetic Checklist: ,, timeout performed, Correct Patient, Correct Site, Correct Laterality, Correct Procedure, Correct Position, site marked, Risks and benefits discussed,  Surgical consent,  Pre-op evaluation,  At surgeon's request and post-op pain management  Laterality: Right  Prep: chloraprep       Needles:  Injection technique: Single-shot  Needle Type: Echogenic Stimulator Needle     Needle Length: 9cm 9 cm Needle Gauge: 21 and 21 G    Additional Needles:  Procedures: ultrasound guided (picture in chart) and nerve stimulator Interscalene brachial plexus block  Nerve Stimulator or Paresthesia:  Response: 0.4 mA,   Additional Responses:   Narrative:  Start time: 08/12/2014 7:05 AM End time: 08/12/2014 7:20 AM Injection made incrementally with aspirations every 5 mL.  Performed by: Personally  Anesthesiologist: Lillia Abed  Additional Notes: Monitors applied. Patient sedated. Sterile prep and drape,hand hygiene and sterile gloves were used. Relevant anatomy identified.Needle position confirmed.Local anesthetic injected incrementally after negative aspiration. Local anesthetic spread visualized around nerve(s). Vascular puncture avoided. No complications. Image printed for medical record.The patient tolerated the procedure well.        Procedure Name: Intubation Date/Time: 08/12/2014 7:44 AM Performed by: Adalberto Ill Pre-anesthesia Checklist: Patient identified, Emergency Drugs available, Suction available, Patient being monitored and Timeout performed Patient Re-evaluated:Patient Re-evaluated prior to inductionOxygen Delivery Method: Circle system utilized Preoxygenation: Pre-oxygenation with 100% oxygen Intubation Type: IV induction Ventilation: Mask ventilation without difficulty Laryngoscope Size: Miller and 2 Grade View: Grade II Tube type: Oral Tube size: 7.0 mm Number of attempts:  1 Airway Equipment and Method: Patient positioned with wedge pillow

## 2014-08-12 NOTE — Op Note (Signed)
NAME:  RAINBOW, SALMAN NO.:  [id number]  MEDICAL RECORD NO.:  [id number]  LOCATION:  5N04C                        FACILITY:  [facility]  PHYSICIAN:  Doran Heater. Veverly Fells, M.D. DATE OF BIRTH:  1940-12-02  DATE OF PROCEDURE:  08/12/2014 DATE OF DISCHARGE:                              OPERATIVE REPORT   PREOPERATIVE DIAGNOSIS:  Right shoulder rotator cuff tear arthropathy.  POSTOPERATIVE DIAGNOSIS:  Right shoulder rotator cuff tear arthropathy.  PROCEDURE PERFORMED:  Right shoulder reverse total shoulder arthroplasty using DePuy Delta Xtend prosthesis.  ATTENDING SURGEON:  Doran Heater. Veverly Fells, M.D.  ASSISTANT:  Abbott Pao. Dixon, PA-C, who scrubbed the entire procedure and necessary for satisfactory completion of surgery.  ANESTHESIA:  General anesthesia was used plus interscalene block.  ESTIMATED BLOOD LOSS:  200 mL.  FLUID REPLACEMENT:  1500 mL crystalloid.  INSTRUMENT COUNTS:  Correct.  COMPLICATIONS:  There were no complications.  ANTIBIOTICS:  Perioperative antibiotics were given.  INDICATIONS:  The patient is a 74 year old female with disabling right shoulder pain and loss of function.  The patient has paralysis due to lack of fixed fulcrum mechanics and rotator cuff insufficiency.  The patient has had progressive pain despite conservative management, desires operative treatment to restore function, eliminate pain to her shoulder.  Informed consent was obtained.  DESCRIPTION OF PROCEDURE:  After an adequate level of anesthesia was achieved, the patient was positioned in modified beach-chair position. Right shoulder was correctly identified and sterilely prepped and draped in usual manner.  Time-out was called.  We entered the shoulder using standard deltopectoral incision starting at the coracoid process extending down to the anterior humerus.  Dissection was down through the subcutaneous tissues using the electrocautery.  We identified the cephalic vein,  took it laterally with the deltoid.  Pectoralis was taken medially.  Conjoined tendon was identified and retracted medially. Subscapularis was taken subperiosteal off the lesser tuberosity and tagged for repair.  We then released the inferior capsule and then remaining rotator cuff tendons on pump.  Supraspinatus, infraspinatus with advanced arthritic changes.  The teres minor was obtained.  We then went ahead and delivered the humeral head out with extension of the shoulder.  We entered the proximal humerus with a 6-mm reamer and reamed up to size 10.  I did a metaphyseal reaming set on epi 1 left to the DePuy Delta Xtend prosthesis.  We then placed the trial prosthesis in, set on 30 degrees of retroversion.  We then subluxed the humerus posteriorly, did our glenoid preparation first with a 360-degree capsular removal, and glenoid labral removal.  We also removed the remaining biceps tendon and tenodesed the biceps on the humerus, mid tension.  We next went ahead and identified the center point for the guidepin.  We placed our guidepin for the appropriate orientation, slightly inferior and centered on the inferior portion of the glenoid. We then reamed for the metaglene and drilled our central peg hole, impacted the metaglene in position, placed a 42 locked inferiorly, 36 locked to the base of the coracoid and then 18 nonlocked anterior posteriorly.  We then screwed on the 38 standard glenosphere, carefully protected the axillary nerve.  Once  the glenosphere was in position, we reduced her shoulder to 38+ 3 poly insert.  We next went ahead and removed our trial.  We ranged the shoulder, it was nice and taut conjoined tendon, no gapping with external rotation, negative sulcus and no bony or soft tissue impingement.  At this point, we removed the trial components, we thoroughly irrigated the humeral shaft.  We drilled holes and tendons sutures to repair the subscapularis and placed two  #2 FiberWire sutures in a mattress fashion in the lesser tuberosity.  We then went ahead and used impaction grafting technique to impact the HA coated size 10 stem with an epi 1 right, set on 0 and place in 30 degrees of retroversion.  We impacted that in position with bone graft, had nice solid fit, selected a 38+ 3 real poly insert, impacted that and reduced the shoulder again, great soft tissue balancing, excellent motion, no impingement.  We repaired the subscapularis anatomically the bone.  We then rearranged the shoulder and found no impingement, no tethering and no restriction of motion.  We irrigated thoroughly.  We could externally rotate to 45 degrees with that repair.  We then went ahead and irrigated and repaired the deltopectoral interval with 0 Vicryl suture followed by 2-0 Vicryl subcutaneous closure and 4-0 Monocryl for skin.  The patient had a sterile bandage placed and a shoulder sling, and was awakened and taken to the recovery room in stable condition.     Doran Heater. Veverly Fells, M.D.     SRN/MEDQ  D:  08/12/2014  T:  08/12/2014  Job:  [id number]

## 2014-08-13 LAB — BASIC METABOLIC PANEL
Anion gap: 9 (ref 5–15)
BUN: 12 mg/dL (ref 6–23)
CALCIUM: 8.9 mg/dL (ref 8.4–10.5)
CO2: 23 mmol/L (ref 19–32)
Chloride: 105 mmol/L (ref 96–112)
Creatinine, Ser: 0.84 mg/dL (ref 0.50–1.10)
GFR calc Af Amer: 78 mL/min — ABNORMAL LOW (ref >90)
GFR, EST NON AFRICAN AMERICAN: 67 mL/min — AB (ref >90)
GLUCOSE: 130 mg/dL — AB (ref 70–99)
Potassium: 3.9 mmol/L (ref 3.5–5.1)
Sodium: 137 mmol/L (ref 135–145)

## 2014-08-13 LAB — HEMOGLOBIN AND HEMATOCRIT, BLOOD
HEMATOCRIT: 37.4 % (ref 36.0–46.0)
Hemoglobin: 12.2 g/dL (ref 12.0–15.0)

## 2014-08-13 NOTE — Progress Notes (Signed)
Orthopedics Progress Note  Subjective: Patient reports a lot of shoulder pain this morning. She also lives alone and is concerned about falling. She has had four falls this year already  Objective:  Filed Vitals:   08/13/14 0558  BP: 166/79  Pulse: 66  Temp: 98.7 F (37.1 C)  Resp: 18    General: Awake and alert  Musculoskeletal: right shoulder dressing CDI moves hand and wrist with min pain. Neurovascularly intact  Lab Results  Component Value Date   WBC 7.9 08/03/2014   HGB 12.2 08/13/2014   HCT 37.4 08/13/2014   MCV 91.0 08/03/2014   PLT 178 08/03/2014       Component Value Date/Time   NA 139 08/03/2014 1445   K 4.3 08/03/2014 1445   CL 104 08/03/2014 1445   CO2 27 08/03/2014 1445   GLUCOSE 106* 08/03/2014 1445   BUN 15 08/03/2014 1445   CREATININE 0.90 08/03/2014 1445   CALCIUM 9.6 08/03/2014 1445   CALCIUM 9.6 08/10/2012 0945   GFRNONAA 62* 08/03/2014 1445   GFRAA 72* 08/03/2014 1445    No results found for: INR, PROTIME  Assessment/Plan: POD #1 s/p Procedure(s): RIGHT REVERSE TOTAL SHOULDER ARTHROPLASTY OT for ADLS and AAROM AROM as tolerated. No Weight bearing. PT for high fall risk! She will need short term SNF after discharge  Doran Heater. Veverly Fells, MD 08/13/2014 7:37 AM

## 2014-08-13 NOTE — Evaluation (Signed)
Occupational Therapy Evaluation Patient Details Name: [name] [name] MRN: [id number] DOB: Sep 21, 1940 Today's Date: 08/13/2014    History of Present Illness R reverse TSA   Clinical Impression   Pt with decline in function and safety with ADLs and ADL mobility with decreased strength, balance, endurance, R UE function (sling, NWB). Pt would benefit from acute OT services to address impairments to increase level of function and safety. Pt agreeable to short term SNF, however was hoping to return home. Pt live at home alone and not bale to have 24 hour assist and sup    Follow Up Recommendations  SNF;Supervision/Assistance - 24 hour    Equipment Recommendations  None recommended by OT (TBD at next venue of care)    Recommendations for Other Services PT consult     Precautions / Restrictions Precautions Precautions: Shoulder Shoulder Interventions: Shoulder sling/immobilizer;Off for dressing/bathing/exercises Precaution Booklet Issued: Yes (comment) Precaution Comments: shoulder/sling protocol. ROM specified per MD Restrictions Weight Bearing Restrictions: Yes Other Position/Activity Restrictions: R UE NWB      Mobility Bed Mobility Overal bed mobility: Needs Assistance Bed Mobility: Supine to Sit;Sit to Supine     Supine to sit: HOB elevated;Min assist Sit to supine: Mod assist   General bed mobility comments: min A to elevate trunk, mod A with LEs back onto bed  Transfers Overall transfer level: Needs assistance Equipment used: 1 person hand held assist Transfers: Sit to/from [facility] Sit to Stand: Min assist Stand pivot transfers: Min assist       General transfer comment: cues for correct hand placement    Balance Overall balance assessment: Needs assistance Sitting-balance support: No upper extremity supported;Feet supported Sitting balance-Leahy Scale: Fair     Standing balance support: Single extremity supported;During functional  activity;Bilateral upper extremity supported (R UE in sling) Standing balance-Leahy Scale: Poor                              ADL Overall ADL's : Needs assistance/impaired     Grooming: Wash/dry hands;Wash/dry face;Set up;Sitting   Upper Body Bathing: Moderate assistance;Sitting   Lower Body Bathing: Maximal assistance   Upper Body Dressing : Moderate assistance   Lower Body Dressing: Total assistance   Toilet Transfer: BSC;Stand-pivot;Cueing for safety   Toileting- Clothing Manipulation and Hygiene: Maximal assistance;Sit to/from stand       Functional mobility during ADLs: Minimal assistance;Cueing for safety       Vision  reading glasses   Perception Perception Perception Tested?: No   Praxis Praxis Praxis tested?: Not tested    Pertinent Vitals/Pain Pain Assessment: 0-10 Pain Score: 8  Pain Location: R shoulder Pain Descriptors / Indicators: Aching;Sore Pain Intervention(s): Limited activity within patient's tolerance;Monitored during session;Premedicated before session;Repositioned     Hand Dominance Right   Extremity/Trunk Assessment Upper Extremity Assessment Upper Extremity Assessment: Generalized weakness;RUE deficits/detail RUE: Unable to fully assess due to immobilization       Cervical / Trunk Assessment Cervical / Trunk Assessment: Normal   Communication Communication Communication: No difficulties   Cognition Arousal/Alertness: Awake/alert Behavior During Therapy: WFL for tasks assessed/performed Overall Cognitive Status: Within Functional Limits for tasks assessed                     General Comments   pt pleasant and cooperative, talkative           Shoulder Instructions Shoulder Instructions Donning/doffing shirt without moving shoulder: Moderate assistance Method for  sponge bathing under operated UE: Moderate assistance Donning/doffing sling/immobilizer: Maximal assistance Correct positioning of  sling/immobilizer: Supervision/safety ROM for elbow, wrist and digits of operated UE: Minimal assistance Sling wearing schedule (on at all times/off for ADL's): Supervision/safety Proper positioning of operated UE when showering: Supervision/safety Positioning of UE while sleeping: [place] expects to be discharged to:: Private residence Living Arrangements: Alone Available Help at Discharge: Friend(s);Family Type of Home: House Home Access: Stairs to enter [employer] of Steps: 1   Home Layout: One level     Bathroom Shower/Tub: Tub/shower unit;Walk-in shower   Bathroom Toilet: Standard     Home Equipment: Crutches;Cane - single point;Toilet riser;Shower seat          Prior Functioning/Environment Level of Independence: Independent        Comments: pt stated that she used no AD for mobility but that she has had multiple falls    OT Diagnosis: Generalized weakness;Acute pain   OT Problem List: Decreased strength;Decreased knowledge of use of DME or AE;Decreased activity tolerance;Pain;Decreased safety awareness;Impaired balance (sitting and/or standing);Decreased knowledge of precautions   OT Treatment/Interventions: Self-care/ADL training;Therapeutic exercise;Patient/family education;Therapeutic activities;DME and/or AE instruction    OT Goals(Current goals can be found in the care plan section) Acute Rehab OT Goals Patient Stated Goal: " go home, but probabl rehab first" OT Goal Formulation: With patient Time For Goal Achievement: 08/20/14 Potential to Achieve Goals: Good ADL Goals Pt Will Perform Upper Body Bathing: with min assist;sitting Pt Will Perform Upper Body Dressing: with min assist;sitting Pt Will Transfer to Toilet: with min guard assist;bedside commode Pt Will Perform Toileting - Clothing Manipulation and hygiene: with mod assist;sitting/lateral leans;sit to/from stand Additional ADL Goal #1: AROM of  R elbow, hand and wrist while seated Additional ADL Goal #2: R shoulder FF 90 degrees,  ABD 0 - 60 degrees and ER 0 - 30 degrees as instructed/per MD  OT Frequency: Min 2X/week   Barriers to D/C: Decreased caregiver support  lives at home alone                     End of Session Equipment Utilized During Treatment: Gait belt Nurse Communication: Mobility status  Activity Tolerance: Patient tolerated treatment well Patient left: in bed;with call bell/phone within reach   Time: 0045-9977 OT Time Calculation (min): 40 min Charges:  OT General Charges $OT Visit: 1 Procedure OT Evaluation $Initial OT Evaluation Tier I: 1 Procedure OT Treatments $Self Care/Home Management : 8-22 mins $Therapeutic Activity: 8-22 mins G-Codes:    Britt Bottom 08/13/2014, 1:34 PM

## 2014-08-13 NOTE — Plan of Care (Signed)
Problem: Consults Goal: Diagnosis - Shoulder Surgery Reverse Total Shoulder Arthroplasty  Right

## 2014-08-14 NOTE — Clinical Social Work Note (Signed)
Clinical Social Work Assessment  Patient Details  Name: [name] [name] MRN: [id number] Date of Birth: [birth year]  Date of referral:  08/14/14               Reason for consult:  Discharge Planning                Permission sought to share information with:  Facility Sport and exercise psychologist, Family Supports Permission granted to share information::  Yes, Verbal Permission Granted  Name::     Joneen Roach  [phone]  Agency::  [facility] area  Relationship::  Adult Son   Contact Information:  Alvester Chou Musslewhite  [phone]  Housing/Transportation Living arrangements for the past 2 months:  [place] of Information:  Patient Patient Interpreter Needed:  None Criminal Activity/Legal Involvement Pertinent to Current Situation/Hospitalization:  No - Comment as needed Significant Relationships:  Adult Children, Friend, Other Family Members Lives with:  Self Do you feel safe going back to the place where you live?  Yes Need for family participation in patient care:  Yes (Comment) Alvester Chou Musslewhite  [phone])  Care giving concerns:  None at this time    Social Worker assessment / plan:  CSW met with the Pt at the bedside. CSW introduced self and reason for assessment. Pt was aware of the need for SNF and stated "I keep having falls." Pt lives alone and has a small cat the runs under her feet. Pt was upset that she will have to give up cat but stated "it's better that way". CSW explained SNF search process and requested permission for SNF search in [place] area. Pt gave permission. CSW will begin search and weekday CSW will f/u with d/c.  Employment status:  Disabled (Comment on whether or not currently receiving Disability) Insurance information:  Managed Medicare PT Recommendations:  [place] / Referral to community resources:  [place]  Patient/Family's Response to care: Pt was appreciative for assistance  with d/c planning.    Patient/Family's Understanding of and Emotional Response to Diagnosis, Current Treatment, and Prognosis:  Pt is upset that she continues to fall but is looking forward to receiving PT.  Emotional Assessment Appearance:  Appears stated age, Developmentally appropriate Attitude/Demeanor/Rapport:    Affect (typically observed):  Accepting, Appropriate, Stable Orientation:  Oriented to Self, Oriented to Place, Oriented to  Time, Oriented to Situation Alcohol / Substance use:  Not Applicable Psych involvement (Current and /or in the community):  No (Comment)  Discharge Needs  Concerns to be addressed:  Discharge Planning Concerns Readmission within the last 30 days:  No Current discharge risk:  None Barriers to Discharge:  No Barriers Identified   Pete Pelt 08/14/2014, 6:10 PM

## 2014-08-14 NOTE — Evaluation (Signed)
Physical Therapy Evaluation Patient Details Name: [name] [name] MRN: [id number] DOB: Oct 24, 1940 Today's Date: 08/14/2014   History of Present Illness  Pt s/p reverse TSA  Past Medical History  Diagnosis Date  . Anxiety   . GERD (gastroesophageal reflux disease)   . Hyperlipidemia   . Hypertension   . Arthritis   . Chronic kidney disease     stones  . Osteopenia   . Hiatal hernia   . Diverticulosis of colon (without mention of hemorrhage)   . Allergy   . Cancer     Basal cell  . Cataract   . Depression   . ACUTE KIDNEY FAILURE UNSPECIFIED 07/12/2009  . CAD (coronary artery disease)     Mild nonobstructive by CT  . Fibromyalgia      Clinical Impression  Patient is s/p above surgery resulting in functional limitations due to the deficits listed below (see PT Problem List).  Patient will benefit from skilled PT to increase their independence and safety with mobility to allow discharge to the venue listed below.       Follow Up Recommendations SNF;Supervision/Assistance - 24 hour    Equipment Recommendations  Cane    Recommendations for Other Services       Precautions / Restrictions Precautions Precautions: Shoulder Type of Shoulder Precautions: no pushing, pulling, lifting with RUE (can use to hold light items), use for ADL in front of pt; AROM/PROM  FF-90 degrees, Abduction 60 degrees and ER-30 degrees Shoulder Interventions: Shoulder sling/immobilizer (for comfort and sleep) Precaution Booklet Issued: Yes (comment) (one in room) Precaution Comments: educated pt on shoulder precautions Required Braces or Orthoses: Sling Restrictions Weight Bearing Restrictions: Yes RUE Weight Bearing: Non weight bearing Other Position/Activity Restrictions: R UE NWB      Mobility  Bed Mobility Overal bed mobility: Needs Assistance Bed Mobility: Supine to Sit     Supine to sit: Min assist     General bed mobility comments: assist with trunk  Transfers Overall transfer  level: Needs assistance Equipment used: None Transfers: Sit to/from Stand Sit to Stand: Min assist         General transfer comment: cues for technique  Ambulation/Gait Ambulation/Gait assistance: Min assist Ambulation Distance (Feet): 150 Feet Assistive device: 1 person hand held assist Gait Pattern/deviations: Decreased step length - right;Decreased step length - left Gait velocity: quite slow   General Gait Details: Benefits from LUE hand held assist for safety; slow amb, and noted dependence on LUE support for balance  Stairs            Wheelchair Mobility    Modified Rankin (Stroke Patients Only)       Balance     Sitting balance-Leahy Scale: Fair                                       Pertinent Vitals/Pain Pain Assessment: 0-10 Pain Score: 4  Pain Location: Rt shoulder Pain Descriptors / Indicators: Aching Pain Intervention(s): Monitored during session;Repositioned    Home Living Family/patient expects to be discharged to:: Private residence Living Arrangements: Alone Available Help at Discharge: Friend(s);Family Type of Home: House Home Access: Stairs to enter   [employer] of Steps: 1 Home Layout: One level Home Equipment: Crutches;Cane - single point;Toilet riser;Shower seat      Prior Function Level of Independence: Independent         Comments: pt stated that she used  no AD for mobility but that she has had multiple falls     Hand Dominance   Dominant Hand: Right    Extremity/Trunk Assessment   Upper Extremity Assessment: Defer to OT evaluation           Lower Extremity Assessment: Generalized weakness      Cervical / Trunk Assessment: Normal  Communication   Communication: No difficulties  Cognition Arousal/Alertness: Awake/alert Behavior During Therapy: WFL for tasks assessed/performed Overall Cognitive Status: Within Functional Limits for tasks assessed                       General Comments      Exercises Other Exercises Other Exercises: Pt performed approximately 10 reps each of right elbow flexion/extension (AAROM), wrist flexion/extension (AROM), and digit composite flexion/extension (AROM). Other Exercises: Pt performed neck ROM Other Exercises: Pt performed approximately 10 reps each of shoulder flexion (AAROM), external rotation (AAROM), and Abduction (AAROM). Donning/doffing shirt without moving shoulder:  (educated on technique; pt able to verbalize technique later) Method for sponge bathing under operated UE: Minimal assistance Donning/doffing sling/immobilizer:  (OT doffed sling and pt able to don sling with Mod assist.) Correct positioning of sling/immobilizer: Moderate assistance ROM for elbow, wrist and digits of operated UE: Minimal assistance Sling wearing schedule (on at all times/off for ADL's):  (educated) Positioning of UE while sleeping:  (educated)      Assessment/Plan    PT Assessment Patient needs continued PT services  PT Diagnosis Difficulty walking   PT Problem List Decreased strength;Decreased range of motion;Decreased activity tolerance;Decreased balance;Decreased mobility;Decreased knowledge of use of DME;Decreased knowledge of precautions  PT Treatment Interventions DME instruction;Gait training;Stair training;Functional mobility training;Therapeutic activities;Therapeutic exercise;Patient/family education   PT Goals (Current goals can be found in the Care Plan section) Acute Rehab PT Goals Patient Stated Goal: not stated PT Goal Formulation: With patient Time For Goal Achievement: 08/28/14 Potential to Achieve Goals: Good    Frequency Min 3X/week   Barriers to discharge        Co-evaluation               End of Session Equipment Utilized During Treatment: Gait belt Activity Tolerance: Patient tolerated treatment well Patient left: in chair;with call bell/phone within reach Nurse Communication: Mobility  status         Time: 8338-2505 PT Time Calculation (min) (ACUTE ONLY): 22 min   Charges:   PT Evaluation $Initial PT Evaluation Tier I: 1 Procedure     PT G CodesQuin Hoop 08/14/2014, 3:37 PM  Roney [place], [place] Pager [phone] Office [phone]

## 2014-08-14 NOTE — Progress Notes (Addendum)
Occupational Therapy Treatment Patient Details Name: [name] [name] MRN: [id number] DOB: 1940-08-22 Today's Date: 08/14/2014    History of present illness Pt s/p reverse TSA   OT comments  Pt progressing. Performed RUE exercises and ADLs in session. Continue to recommend SNF for rehab.  Follow Up Recommendations  SNF;Supervision/Assistance - 24 hour    Equipment Recommendations   (defer to next venue)    Recommendations for Other Services PT consult    Precautions / Restrictions Precautions Precautions: Shoulder Type of Shoulder Precautions: no pushing, pulling, lifting with RUE (can use to hold light items), use for ADL in front of pt; AROM/PROM  FF-90 degrees, Abduction 60 degrees and ER-30 degrees Shoulder Interventions: Shoulder sling/immobilizer (for comfort and sleep) Precaution Booklet Issued: Yes (comment) (one in room) Precaution Comments: educated pt on shoulder precautions Required Braces or Orthoses: Sling Restrictions Weight Bearing Restrictions: Yes RUE Weight Bearing: Non weight bearing       Mobility Bed Mobility Overal bed mobility: Needs Assistance Bed Mobility: Supine to Sit     Supine to sit: Min assist     General bed mobility comments: assist with trunk  Transfers Overall transfer level: Needs assistance Equipment used: None Transfers: Sit to/from Stand Sit to Stand: Min guard         General transfer comment: cues for technique    Balance  Min guard for ambulation.                                  ADL Overall ADL's : Needs assistance/impaired     Grooming: Wash/dry hands;Wash/dry face;Oral care;Applying deodorant;Brushing hair;Minimal assistance;Standing   Upper Body Bathing: Minimal assitance;Standing (did not do complete upper body)   Lower Body Bathing: Min guard (standing-washed bottom/peri area)   Upper Body Dressing :  (OT assisted with gown so she could bathe; Mod assist-sling)       Toilet Transfer: Min  guard;Ambulation;Comfort height toilet   Toileting- Clothing Manipulation and Hygiene: Min guard;Sit to/from stand       Functional mobility during ADLs: Min guard (ambulated some in room/bathroom) General ADL Comments: Encouraged pt to use RUE in ADLs.       Vision                     Perception     Praxis      Cognition  Awake/Alert Behavior During Therapy: WFL for tasks assessed/performed Overall Cognitive Status: Within Functional Limits for tasks assessed (talkative-OT tried to keep pt on task)                       Extremity/Trunk Assessment               Exercises Other Exercises Other Exercises: Pt performed approximately 10 reps each of right elbow flexion/extension (AAROM), wrist flexion/extension (AROM), and digit composite flexion/extension (AROM). Other Exercises: Pt performed neck ROM Other Exercises: Pt performed approximately 10 reps each of shoulder flexion (AAROM), external rotation (AAROM), and Abduction (AAROM). Donning/doffing shirt without moving shoulder:  (educated on technique; pt able to verbalize technique later) Method for sponge bathing under operated UE: Minimal assistance Donning/doffing sling/immobilizer:  (OT doffed sling and pt able to don sling with Mod assist.) Correct positioning of sling/immobilizer: Moderate assistance ROM for elbow, wrist and digits of operated UE: Minimal assistance Sling wearing schedule (on at all times/off for ADL's):  (educated) Positioning of UE while  sleeping:  (educated)   Shoulder Instructions Shoulder Instructions Donning/doffing shirt without moving shoulder:  (educated on technique; pt able to verbalize technique later) Method for sponge bathing under operated UE: Minimal assistance Donning/doffing sling/immobilizer:  (OT doffed sling and pt able to don sling with Mod assist.) Correct positioning of sling/immobilizer: Moderate assistance ROM for elbow, wrist and digits of operated  UE: Minimal assistance Sling wearing schedule (on at all times/off for ADL's):  (educated) Positioning of UE while sleeping:  (educated)     General Comments      Pertinent Vitals/ Pain       Pain Assessment: 0-10 Pain Score: 4  (at end of session) Pain Location: Rt shoulder Pain Descriptors / Indicators: Constant Pain Intervention(s): Repositioned;Monitored during session  Home Living                                          Prior Functioning/Environment              Frequency Min 2X/week     Progress Toward Goals  OT Goals(current goals can now be found in the care plan section)  Progress towards OT goals: Progressing toward goals  Acute Rehab OT Goals Patient Stated Goal: not stated OT Goal Formulation: With patient Time For Goal Achievement: 08/20/14 Potential to Achieve Goals: Good ADL Goals Pt Will Perform Grooming: with set-up;standing;with supervision Pt Will Perform Upper Body Bathing: with min assist;sitting Pt Will Perform Upper Body Dressing: with min assist;sitting Pt Will Transfer to Toilet: with supervision;ambulating;bedside commode Pt Will Perform Toileting - Clothing Manipulation and hygiene: with supervision;sit to/from stand Additional ADL Goal #1: Pt will independently perform AROM of R elbow, hand and wrist while seated Additional ADL Goal #2: Pt will independently perform R shoulder FF 90 degrees,  ABD 0 - 60 degrees and ER 0 - 30 degrees as instructed/per MD  Plan Discharge plan remains appropriate    Co-evaluation                 End of Session Equipment Utilized During Treatment: Gait belt;Other (comment) (sling)   Activity Tolerance Patient tolerated treatment well   Patient Left in chair;with call bell/phone within reach   Nurse Communication Other (comment) (leaving pt in chair)        Time: 4680-3212 OT Time Calculation (min): 53 min (few minutes spent on toilet)  Charges: OT General Charges $OT  Visit: 1 Procedure OT Treatments $Self Care/Home Management : 8-22 mins $Therapeutic Activity: 8-22 mins $Therapeutic Exercise: 8-22 mins  Benito Mccreedy OTR/L [phone] 08/14/2014, 1:34 PM

## 2014-08-14 NOTE — Clinical Social Work Placement (Addendum)
   CLINICAL SOCIAL WORK PLACEMENT  NOTE  Date:  08/14/2014  Patient Details  Name: [name] [name] MRN: [id number] Date of Birth: Apr 17, 1941  Clinical Social Work is seeking post-discharge placement for this patient at the [place] level of care (*CSW will initial, date and re-position this form in  chart as items are completed):  Yes   Patient/family provided with [facility] Work Department's list of facilities offering this level of care within the geographic area requested by the patient (or if unable, by the patient's family).  Yes   Patient/family informed of their freedom to choose among providers that offer the needed level of care, that participate in Medicare, Medicaid or managed care program needed by the patient, have an available bed and are willing to accept the patient.  Yes   Patient/family informed of [facility]'s ownership interest in [facility] and [facility], as well as of the fact that they are under no obligation to receive care at these facilities.  PASRR submitted to EDS on 08/14/14     PASRR number received on 08/14/14     Existing PASRR number confirmed on       FL2 transmitted to all facilities in geographic area requested by pt/family on 08/14/14     FL2 transmitted to all facilities within larger geographic area on       Patient informed that his/her managed care company has contracts with or will negotiate with certain facilities, including the following:        No   Patient/family informed of bed offers received.  Patient chooses bed at     [facility] ([place], [place], [phone])  Physician recommends and patient chooses bed at     none Patient to be transferred to   [facility] on   08/15/2014 Wylene Men, [place])  Patient to be transferred to facility by     Tommi Emery [facility] Helaine Chess, [place])  Patient family notified on   08/15/2014 of transfer. Wylene Men,  [phone])  Name of family member notified:      Tommi Emery [address] [place], [place], [place])  PHYSICIAN Please sign FL2     Additional Comment:    _______________________________________________ Pete Pelt 08/14/2014, 6:44 PM

## 2014-08-14 NOTE — Progress Notes (Addendum)
Subjective: 2 Days Post-Op Procedure(s) (LRB): RIGHT REVERSE TOTAL SHOULDER ARTHROPLASTY (Right) Patient reports pain as mild.  Reports pain better controlled since yesterday. Has been OOB to bathroom with assistance without issues. Denies numbness or tingling in the arm. No CP, SOB, N/V. No other c/o.  Objective: Vital signs in last 24 hours: Temp:  [98.1 F (36.7 C)-98.2 F (36.8 C)] 98.1 F (36.7 C) (04/24 0543) Pulse Rate:  [74-77] 77 (04/24 0543) Resp:  [18-20] 18 (04/24 0543) BP: (113-135)/(54-86) 124/54 mmHg (04/24 0543) SpO2:  [93 %-95 %] 94 % (04/24 0543)  Intake/Output from previous day: 04/23 0701 - 04/24 0700 In: 460 [P.O.:460] Out: -  Intake/Output this shift:     Recent Labs  08/13/14 0501  HGB 12.2    Recent Labs  08/13/14 0501  HCT 37.4    Recent Labs  08/13/14 0501  NA 137  K 3.9  CL 105  CO2 23  BUN 12  CREATININE 0.84  GLUCOSE 130*  CALCIUM 8.9   No results for input(s): LABPT, INR in the last 72 hours.  Neurologically intact ABD soft Neurovascular intact Sensation intact distally Intact pulses distally Dorsiflexion/Plantar flexion intact Incision: dressing C/D/I and no drainage No cellulitis present Compartment soft no sign of DVT  Assessment/Plan: 2 Days Post-Op Procedure(s) (LRB): RIGHT REVERSE TOTAL SHOULDER ARTHROPLASTY (Right) Advance diet Up with therapy D/C IV fluids  Dressing change today Social work consult already placed for short-term SNF placement Plan D/C tomorrow to SNF  [place]le, Rim Thatch M. 08/14/2014, 8:14 AM

## 2014-08-15 ENCOUNTER — Encounter (HOSPITAL_COMMUNITY): Payer: Self-pay | Admitting: Orthopedic Surgery

## 2014-08-15 DIAGNOSIS — Z471 Aftercare following joint replacement surgery: Secondary | ICD-10-CM | POA: Diagnosis not present

## 2014-08-15 DIAGNOSIS — M19011 Primary osteoarthritis, right shoulder: Secondary | ICD-10-CM | POA: Diagnosis not present

## 2014-08-15 DIAGNOSIS — R2681 Unsteadiness on feet: Secondary | ICD-10-CM | POA: Diagnosis not present

## 2014-08-15 DIAGNOSIS — K219 Gastro-esophageal reflux disease without esophagitis: Secondary | ICD-10-CM | POA: Diagnosis not present

## 2014-08-15 DIAGNOSIS — Z96611 Presence of right artificial shoulder joint: Secondary | ICD-10-CM | POA: Diagnosis not present

## 2014-08-15 DIAGNOSIS — F4323 Adjustment disorder with mixed anxiety and depressed mood: Secondary | ICD-10-CM | POA: Diagnosis not present

## 2014-08-15 DIAGNOSIS — I2510 Atherosclerotic heart disease of native coronary artery without angina pectoris: Secondary | ICD-10-CM | POA: Diagnosis not present

## 2014-08-15 DIAGNOSIS — E785 Hyperlipidemia, unspecified: Secondary | ICD-10-CM | POA: Diagnosis not present

## 2014-08-15 DIAGNOSIS — F329 Major depressive disorder, single episode, unspecified: Secondary | ICD-10-CM | POA: Diagnosis not present

## 2014-08-15 DIAGNOSIS — K5901 Slow transit constipation: Secondary | ICD-10-CM | POA: Diagnosis not present

## 2014-08-15 DIAGNOSIS — F419 Anxiety disorder, unspecified: Secondary | ICD-10-CM | POA: Diagnosis not present

## 2014-08-15 DIAGNOSIS — R278 Other lack of coordination: Secondary | ICD-10-CM | POA: Diagnosis not present

## 2014-08-15 DIAGNOSIS — I10 Essential (primary) hypertension: Secondary | ICD-10-CM | POA: Diagnosis not present

## 2014-08-15 DIAGNOSIS — R609 Edema, unspecified: Secondary | ICD-10-CM | POA: Diagnosis not present

## 2014-08-15 DIAGNOSIS — R21 Rash and other nonspecific skin eruption: Secondary | ICD-10-CM | POA: Diagnosis not present

## 2014-08-15 DIAGNOSIS — M6281 Muscle weakness (generalized): Secondary | ICD-10-CM | POA: Diagnosis not present

## 2014-08-15 DIAGNOSIS — B3789 Other sites of candidiasis: Secondary | ICD-10-CM | POA: Diagnosis not present

## 2014-08-15 NOTE — Progress Notes (Signed)
   Subjective: 3 Days Post-Op Procedure(s) (LRB): RIGHT REVERSE TOTAL SHOULDER ARTHROPLASTY (Right)  Pt sitting up comfortably with mild pain in the right shoulder Denies any new symptoms or issues Patient reports pain as mild.  Objective:   VITALS:   Filed Vitals:   08/15/14 0843  BP: 106/60  Pulse: 80  Temp:   Resp:     Right shoulder incision healing well nv intact distally No rashes or edema Sling in place  LABS  Recent Labs  08/13/14 0501  HGB 12.2  HCT 37.4     Recent Labs  08/13/14 0501  NA 137  K 3.9  BUN 12  CREATININE 0.84  GLUCOSE 130*     Assessment/Plan: 3 Days Post-Op Procedure(s) (LRB): RIGHT REVERSE TOTAL SHOULDER ARTHROPLASTY (Right) D/c to snf today F/u in 2 weeks Continue PT/OT    Merla Riches, MPAS, PA-C  08/15/2014, 9:11 AM

## 2014-08-15 NOTE — Discharge Summary (Signed)
Physician Discharge Summary   Patient ID: [name] [name] MRN: [id number] DOB/AGE: [birth year] 74 y.o.  Admit date: 08/12/2014 Discharge date: 08/15/2014  Admission Diagnoses:  Active Problems:   S/P shoulder replacement   Discharge Diagnoses:  Same   Surgeries: Procedure(s): RIGHT REVERSE TOTAL SHOULDER ARTHROPLASTY on 08/12/2014   Consultants: PT/OT  Discharged Condition: Stable  Hospital Course: [name] [name] is an 74 y.o. female who was admitted 08/12/2014 with a chief complaint of No chief complaint on file. , and found to have a diagnosis of <principal problem not specified>.  They were brought to the operating room on 08/12/2014 and underwent the above named procedures.    The patient had an uncomplicated hospital course and was stable for discharge.  Recent vital signs:  Filed Vitals:   08/15/14 0843  BP: 106/60  Pulse: 80  Temp:   Resp:     Recent laboratory studies:  Results for orders placed or performed during the hospital encounter of 08/12/14  Hemoglobin and hematocrit, blood  Result Value Ref Range   Hemoglobin 12.2 12.0 - 15.0 g/dL   HCT 37.4 36.0 - 84.1 %  Basic metabolic panel  Result Value Ref Range   Sodium 137 135 - 145 mmol/L   Potassium 3.9 3.5 - 5.1 mmol/L   Chloride 105 96 - 112 mmol/L   CO2 23 19 - 32 mmol/L   Glucose, Bld 130 (H) 70 - 99 mg/dL   BUN 12 6 - 23 mg/dL   Creatinine, Ser 0.84 0.50 - 1.10 mg/dL   Calcium 8.9 8.4 - 10.5 mg/dL   GFR calc non Af Amer 67 (L) >90 mL/min   GFR calc Af Amer 78 (L) >90 mL/min   Anion gap 9 5 - 15    Discharge Medications:     Medication List    STOP taking these medications        azithromycin 250 MG tablet  Commonly known as:  ZITHROMAX Z-PAK     predniSONE 20 MG tablet  Commonly known as:  DELTASONE      TAKE these medications        albuterol 108 (90 BASE) MCG/ACT inhaler  Commonly known as:  PROVENTIL HFA;VENTOLIN HFA  Inhale 2 puffs into the lungs every 6 (six) hours as needed for  wheezing or shortness of breath.     ALIGN PO  Take 1 tablet by mouth daily.     amLODipine 5 MG tablet  Commonly known as:  NORVASC  TAKE 1 TABLET BY MOUTH EVERY DAY     amoxicillin-clavulanate 875-125 MG per tablet  Commonly known as:  AUGMENTIN  Take 1 tablet by mouth 2 (two) times daily.     B-12 PO  Take 1 tablet by mouth daily.     CRESTOR 40 MG tablet  Generic drug:  rosuvastatin  TAKE 1 TABLET BY MOUTH EVERY DAY     DULoxetine 60 MG capsule  Commonly known as:  CYMBALTA  Take 1 capsule (60 mg total) by mouth 2 (two) times daily.     metoprolol 50 MG tablet  Commonly known as:  LOPRESSOR  Take 0.5 tablets (25 mg total) by mouth 2 (two) times daily.     omeprazole 20 MG capsule  Commonly known as:  PRILOSEC  TAKE ONE CAPSULE BY MOUTH TWICE A DAY     oxyCODONE-acetaminophen 5-325 MG per tablet  Commonly known as:  ROXICET  Take 1-2 tablets by mouth every 4 (four) hours as needed for severe pain.  tiZANidine 4 MG tablet  Commonly known as:  ZANAFLEX  Take 4 mg by mouth every 8 (eight) hours as needed for muscle spasms.     tiZANidine 4 MG tablet  Commonly known as:  ZANAFLEX  Take 1 tablet (4 mg total) by mouth every 6 (six) hours as needed for muscle spasms.     traMADol 50 MG tablet  Commonly known as:  ULTRAM  Take 50 mg by mouth. Taking one at night.        Diagnostic Studies: Dg Chest 2 View  07/22/2014   CLINICAL DATA:  Chronic hoarseness. Cough with mucus production for 3 weeks  EXAM: CHEST  2 VIEW  COMPARISON:  March 31, 2012  FINDINGS: There is no edema or consolidation. Heart size and pulmonary vascularity are normal. No adenopathy. There is atherosclerotic change in the aortic arch region. No bone lesions.  IMPRESSION: No edema or consolidation.   Electronically Signed   By: Lowella Grip III M.D.   On: 07/22/2014 13:30   Dg Shoulder Right Port  08/12/2014   CLINICAL DATA:  Post right shoulder replacement  EXAM: PORTABLE RIGHT SHOULDER -  2+ VIEW  COMPARISON:  None.  FINDINGS: Two views of the right shoulder submitted. There is right shoulder prosthesis in anatomic alignment. Small amount of periarticular soft tissue air.  IMPRESSION: Right shoulder prosthesis in anatomic alignment. Postsurgical changes with small amount of periarticular soft tissue air.   Electronically Signed   By: Lahoma Crocker M.D.   On: 08/12/2014 11:41    Disposition: 50-Hospice/Home        Follow-up Information    Follow up with NORRIS,STEVEN R, MD. Call in 2 weeks.   Specialty:  Orthopedic Surgery   Why:  [phone]   Contact information:   [address] [place] [address] [phone]        Signed: [place] Bruns 08/15/2014, 9:12 AM

## 2014-08-15 NOTE — Discharge Planning (Signed)
Patient will discharge today per MD order. Patient will discharge to [facility] RN to call report prior to transportation to: [phone] Transportation: son will provide transportation (liaison will need to complete paperwork prior to son's transportation)  CSW sent discharge summary to SNF for review.  Packet is complete.  RN, patient and son aware of discharge plans.  Nonnie Done, [place] [phone]  Psychiatric & Orthopedics (5N 1-16) Clinical Social Worker   '

## 2014-08-15 NOTE — Progress Notes (Signed)
Attempted to call report to [facility] x2

## 2014-08-15 NOTE — Progress Notes (Signed)
Occupational Therapy Treatment Patient Details Name: [name] [name] MRN: [id number] DOB: [birth year] Today's Date: 08/15/2014    History of present illness Pt s/p reverse TSA   OT comments  Pt. Tolerating RUE P/AROM.  Educated on tech. For don/doff sling and benefits of elevation and moving digits throughout the day to aide in edema reduction  Follow Up Recommendations  SNF;Supervision/Assistance - 24 hour    Equipment Recommendations       Recommendations for Other Services      Precautions / Restrictions Precautions Precautions: Shoulder Type of Shoulder Precautions: no pushing, pulling, lifting with RUE (can use to hold light items), use for ADL in front of pt; AROM/PROM  FF-90 degrees, Abduction 60 degrees and ER-30 degrees Shoulder Interventions: Shoulder sling/immobilizer Precaution Comments: educated pt on shoulder precautions Restrictions Weight Bearing Restrictions: Yes RUE Weight Bearing: Non weight bearing Other Position/Activity Restrictions: R UE NWB       Mobility Bed Mobility                  Transfers                      Balance                                   ADL                                                Vision                     Perception     Praxis      Cognition                             Extremity/Trunk Assessment               Exercises Other Exercises Other Exercises: Pt performed approximately 10 reps each of right elbow flexion/extension (AAROM), wrist flexion/extension (AROM), and digit composite flexion/extension (AROM).   Shoulder Instructions       General Comments      Pertinent Vitals/ Pain       Pain Assessment: No/denies pain  Home Living                                          Prior Functioning/Environment              Frequency Min 2X/week     Progress Toward Goals  OT Goals(current goals can now be  found in the care plan section)  Progress towards OT goals: Progressing toward goals     Plan Discharge plan remains appropriate    Co-evaluation                 End of Session     Activity Tolerance Patient tolerated treatment well   Patient Left in chair;with call bell/phone within reach   Nurse Communication          Time: 7741-2878 OT Time Calculation (min): 13 min  Charges: OT General Charges $OT Visit: 1 Procedure OT Treatments $Therapeutic Exercise: 8-22 mins  Annslee Tercero, Rico Junker, COTA/L  08/15/2014, 10:20 AM

## 2014-08-16 ENCOUNTER — Non-Acute Institutional Stay (SKILLED_NURSING_FACILITY): Payer: Medicare Other | Admitting: Adult Health

## 2014-08-16 ENCOUNTER — Encounter: Payer: Self-pay | Admitting: Adult Health

## 2014-08-16 DIAGNOSIS — F4323 Adjustment disorder with mixed anxiety and depressed mood: Secondary | ICD-10-CM

## 2014-08-16 DIAGNOSIS — E785 Hyperlipidemia, unspecified: Secondary | ICD-10-CM | POA: Diagnosis not present

## 2014-08-16 DIAGNOSIS — M19011 Primary osteoarthritis, right shoulder: Secondary | ICD-10-CM | POA: Diagnosis not present

## 2014-08-16 DIAGNOSIS — K5900 Constipation, unspecified: Secondary | ICD-10-CM | POA: Diagnosis not present

## 2014-08-16 DIAGNOSIS — I10 Essential (primary) hypertension: Secondary | ICD-10-CM | POA: Diagnosis not present

## 2014-08-16 DIAGNOSIS — K219 Gastro-esophageal reflux disease without esophagitis: Secondary | ICD-10-CM | POA: Diagnosis not present

## 2014-08-16 NOTE — Progress Notes (Signed)
Patient ID: [name] [name], female   DOB: Jan 31, 1941, 74 y.o.   MRN: [id number]   08/16/2014  Facility:  Nursing Home Location:  [place] Room Number: 408-P LEVEL OF CARE:  SNF (31)   Chief Complaint  Patient presents with  . 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 She has PMH of anxiety, GERD, hyperlipidemia, hypertension, arthritis and basal cell CA.  She is seen in her room and reports to have well-controlled pain. No cough nor fever. Augmentin will be discontinued since there is no need.  She has been admitted for a short-term rehabilitation.  PAST MEDICAL HISTORY:  Past Medical History  Diagnosis Date  . Anxiety   . GERD (gastroesophageal reflux disease)   . Hyperlipidemia   . Hypertension   . Arthritis   . Chronic kidney disease     stones  . Osteopenia   . Hiatal hernia   . Diverticulosis of colon (without mention of hemorrhage)   . Allergy   . Cancer     Basal cell  . Cataract   . Depression   . ACUTE KIDNEY FAILURE UNSPECIFIED 07/12/2009  . CAD (coronary artery disease)     Mild nonobstructive by CT  . Fibromyalgia     CURRENT MEDICATIONS: Reviewed per MAR/see medication list  Allergies  Allergen Reactions  . Lactose Intolerance (Gi) Other (See Comments)    Gi upset  . Meloxicam     REACTION: acute renal failure  with hypovolemic trigger Hospital  3 2011  . Sulfamethoxazole Hives     REVIEW OF SYSTEMS:  GENERAL: no change in appetite, no fatigue, no weight changes, no fever, chills or weakness RESPIRATORY: no cough, SOB, DOE, wheezing, hemoptysis CARDIAC: no chest pain, edema  or palpitations GI: no abdominal pain, diarrhea,  heart burn, nausea or vomiting, +constipation  PHYSICAL EXAMINATION  GENERAL: no acute distress, normal body habitus SKIN:  Right shoulder surgical wound is dry, no redness EYES: conjunctivae normal, sclerae normal, normal eye lids NECK: supple, trachea midline, no neck masses, no thyroid tenderness, no thyromegaly LYMPHATICS: no LAN in the neck, no supraclavicular LAN RESPIRATORY: breathing is even & unlabored, BS CTAB CARDIAC: RRR, no murmur,no extra heart sounds, no edema GI: abdomen soft, normal BS, no masses, no tenderness, no hepatomegaly, no splenomegaly EXTREMITIES: able to move X 4 extremities with limited movement on RUE PSYCHIATRIC: the patient is alert & oriented to person, affect & behavior appropriate  LABS/RADIOLOGY: Labs reviewed: Basic Metabolic Panel:  Recent Labs  03/14/14 0917 08/03/14 1445 08/13/14 0501  NA 141 139 137  K 4.5 4.3 3.9  CL 107 104 105  CO2 27 27 23   GLUCOSE 124* 106* 130*  BUN 16 15 12   CREATININE 0.9 0.90 0.84  CALCIUM 9.4 9.6 8.9   CBC:  Recent Labs  08/03/14 1445 08/13/14 0501  WBC 7.9  --   HGB 13.8 12.2  HCT 42.6 37.4  MCV 91.0  --   PLT 178  --     Dg Chest 2 View  07/22/2014   CLINICAL DATA:  Chronic hoarseness. Cough with mucus production for 3 weeks  EXAM: CHEST  2 VIEW  COMPARISON:  March 31, 2012  FINDINGS: There is no edema or consolidation. Heart size and pulmonary vascularity are normal. No adenopathy. There is atherosclerotic change in the aortic arch region. No bone lesions.  IMPRESSION: No edema or consolidation.   Electronically Signed   By: Lowella Grip III M.D.   On: 07/22/2014 13:30   Dg Shoulder Right Port  08/12/2014   CLINICAL DATA:  Post right shoulder replacement  EXAM: PORTABLE RIGHT SHOULDER - 2+ VIEW  COMPARISON:  None.  FINDINGS: Two views of the right shoulder submitted. There is right shoulder prosthesis in anatomic alignment. Small amount of  periarticular soft tissue air.  IMPRESSION: Right shoulder prosthesis in anatomic alignment. Postsurgical changes with small amount of periarticular soft tissue air.   Electronically Signed   By: Lahoma Crocker M.D.   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 Veverly Fells, ortho surgeon, in 2 weeks Hypertension - well controlled; continue Norvasc 5 mg by mouth daily and metoprolol 25 mg by mouth twice a day Hyperlipidemia - continue Crestor 40 mg by mouth daily GERD - continue Prilosec 20 mg by mouth twice a day Adjustment disorder with mixed anxiety and depressed mood - mood is stable; continue Cymbalta 60 mg by mouth twice a day; patient reports being in this dose for a year and works well for her, she is being followed up by her PCP, Dr. Shanon Ace Constipation - start senna S2 tabs by mouth twice a day   Goals of care:  Short-term rehabilitation   Labs/test ordered:  Bmp and CBC   Spent 50 minutes in patient care.   [facility], NP [employer] [phone]

## 2014-08-19 ENCOUNTER — Non-Acute Institutional Stay (SKILLED_NURSING_FACILITY): Payer: Medicare Other | Admitting: Internal Medicine

## 2014-08-19 DIAGNOSIS — M19011 Primary osteoarthritis, right shoulder: Secondary | ICD-10-CM | POA: Diagnosis not present

## 2014-08-19 DIAGNOSIS — I10 Essential (primary) hypertension: Secondary | ICD-10-CM | POA: Diagnosis not present

## 2014-08-19 DIAGNOSIS — F418 Other specified anxiety disorders: Secondary | ICD-10-CM

## 2014-08-19 DIAGNOSIS — R21 Rash and other nonspecific skin eruption: Secondary | ICD-10-CM

## 2014-08-19 DIAGNOSIS — B3789 Other sites of candidiasis: Secondary | ICD-10-CM | POA: Diagnosis not present

## 2014-08-19 DIAGNOSIS — K5901 Slow transit constipation: Secondary | ICD-10-CM | POA: Diagnosis not present

## 2014-08-19 DIAGNOSIS — E785 Hyperlipidemia, unspecified: Secondary | ICD-10-CM

## 2014-08-19 DIAGNOSIS — K219 Gastro-esophageal reflux disease without esophagitis: Secondary | ICD-10-CM

## 2014-08-19 NOTE — Progress Notes (Signed)
Patient ID: [name] [name], female   DOB: 1940-08-01, 74 y.o.   MRN: [id number]     [facility]   PCP: Lottie Dawson, MD  Code Status: full code  Allergies  Allergen Reactions  . Lactose Intolerance (Gi) Other (See Comments)    Gi upset  . Meloxicam     REACTION: acute renal failure  with hypovolemic trigger Hospital  3 2011  . Sulfamethoxazole Hives    Chief Complaint  Patient presents with  . New Admit To SNF     HPI:  74 year old patient is here for short term rehabilitation post hospital admission from 08/12/14-08/15/14 with right shoulder arthritis. She underwent right reverse total shoulder arthroplasty. She has PMH of anxiety, GERD, hyperlipidemia, hypertension, arthritis and basal cell CA. She is seen in her room today. She is lying in bed, in no distress. Her pain is under control with current regimen. She has itching and has noticed redness in her groin area. She also complaints of itching and redness on her feet- mentions this has happened before but mostly in winter season. Denies any other concerns.   Review of Systems:  Constitutional: positive for easy fatigue. Negative for fever, chills, diaphoresis.  HENT: Negative for headache, congestion Eyes: Negative for eye pain, blurred vision, double vision and discharge.  Respiratory: Negative for cough, shortness of breath and wheezing.   Cardiovascular: Negative for chest pain, palpitations, leg swelling.  Gastrointestinal: Negative for heartburn, nausea, vomiting, abdominal pain. Had 4 bowel movement yesterday and 1 today. Denies loose stool. Genitourinary: Negative for dysuria Musculoskeletal: Negative for back pain, falls Skin: see hpi Neurological: Negative for dizziness, tingling, focal weakness Psychiatric/Behavioral: Negative for depression.    Past Medical History  Diagnosis Date  . Anxiety   . GERD (gastroesophageal reflux disease)   . Hyperlipidemia   .  Hypertension   . Arthritis   . Chronic kidney disease     stones  . Osteopenia   . Hiatal hernia   . Diverticulosis of colon (without mention of hemorrhage)   . Allergy   . Cancer     Basal cell  . Cataract   . Depression   . ACUTE KIDNEY FAILURE UNSPECIFIED 07/12/2009  . CAD (coronary artery disease)     Mild nonobstructive by CT  . Fibromyalgia    Past Surgical History  Procedure Laterality Date  . Cholecystectomy    . Total knee arthroplasty      left  . Cystectomy  2001    benign  . Bladder tumor excision  2001  . Tendon repair  11/2005    right shoulder  . Lumbar disc surgery      x 2 [facility]  . Cataract extraction      bilateral  . Esophageal manometry N/A 07/13/2012    Procedure: ESOPHAGEAL MANOMETRY (EM);  Surgeon: Sable Feil, MD;  Location: [facility];  Service: Endoscopy;  Laterality: N/A;  . Reverse shoulder arthroplasty Right 08/12/2014    Procedure: RIGHT REVERSE TOTAL SHOULDER ARTHROPLASTY;  Surgeon: Netta Cedars, MD;  Location: [facility];  Service: Orthopedics;  Laterality: Right;   Social History:   reports that she has never smoked. She has never used smokeless tobacco. She reports that she drinks alcohol. She reports that she does not use illicit drugs.  Family History  Problem Relation Age of Onset  . Lung cancer Father     tobacco  . Kidney cancer Son   . Hypertension Brother  mom's side  . Alzheimer's disease Mother   . Osteoporosis Sister   . Breast cancer Cousin   . Colon cancer Neg Hx   . Heart disease Maternal Uncle     x 4  . Heart disease Maternal Aunt     x 4  . Diabetes Paternal Grandmother   . Diabetes Paternal Uncle     x 2    Medications: Patient's Medications  New Prescriptions   No medications on file  Previous Medications   ALBUTEROL (PROVENTIL HFA;VENTOLIN HFA) 108 (90 BASE) MCG/ACT INHALER    Inhale 2 puffs into the lungs every 6 (six) hours as needed for wheezing or shortness of breath.   AMLODIPINE (NORVASC) 5  MG TABLET    TAKE 1 TABLET BY MOUTH EVERY DAY   CRESTOR 40 MG TABLET    TAKE 1 TABLET BY MOUTH EVERY DAY   CYANOCOBALAMIN (B-12 PO)    Take 1 tablet by mouth daily.    DULOXETINE (CYMBALTA) 60 MG CAPSULE    Take 1 capsule (60 mg total) by mouth 2 (two) times daily.   METOPROLOL (LOPRESSOR) 50 MG TABLET    Take 0.5 tablets (25 mg total) by mouth 2 (two) times daily.   OMEPRAZOLE (PRILOSEC) 20 MG CAPSULE    TAKE ONE CAPSULE BY MOUTH TWICE A DAY   OXYCODONE-ACETAMINOPHEN (ROXICET) 5-325 MG PER TABLET    Take 1-2 tablets by mouth every 4 (four) hours as needed for severe pain.   PROBIOTIC PRODUCT (ALIGN PO)    Take 1 tablet by mouth daily.   TIZANIDINE (ZANAFLEX) 4 MG TABLET    Take 1 tablet (4 mg total) by mouth every 6 (six) hours as needed for muscle spasms.   TRAMADOL (ULTRAM) 50 MG TABLET    Take 50 mg by mouth. Taking one at night.  Modified Medications   No medications on file  Discontinued Medications   AMOXICILLIN-CLAVULANATE (AUGMENTIN) 875-125 MG PER TABLET    Take 1 tablet by mouth 2 (two) times daily.   TIZANIDINE (ZANAFLEX) 4 MG TABLET    Take 4 mg by mouth every 8 (eight) hours as needed for muscle spasms.      Physical Exam: Filed Vitals:   08/19/14 1059  BP: 117/63  Pulse: 70  Temp: 98 F (36.7 C)  Resp: 18  Weight: 172 lb 9.6 oz (78.291 kg)  SpO2: 99%    General- elderly female, obese, in no acute distress Head- normocephalic, atraumatic Throat- moist mucus membrane Eyes- PERRLA, EOMI, no pallor, no icterus, no discharge, normal conjunctiva, normal sclera Neck- no cervical lymphadenopathy Cardiovascular- normal s1,s2, no murmurs, palpable dorsalis pedis and radial pulses, no leg edema Respiratory- bilateral clear to auscultation, no wheeze, no rhonchi, no crackles, no use of accessory muscles Abdomen- bowel sounds present, soft, non tender Musculoskeletal- able to move all 4 extremities, right shoulder limited range of motion, arthritis changes in her fingers  noted Neurological- no focal deficit Skin- warm and dry, right shoulder has aquacel dressing, bilateral groin area redness noted, no skin maceration. Redness under left abdominal fold area with moisture noted. Also has redness with flaking of skin on both feet, non tender Psychiatry- alert and oriented to person, place and time, normal mood and affect    Labs reviewed: Basic Metabolic Panel:  Recent Labs  03/14/14 0917 08/03/14 1445 08/13/14 0501  NA 141 139 137  K 4.5 4.3 3.9  CL 107 104 105  CO2 27 27 23   GLUCOSE 124* 106* 130*  BUN 16 15 12   CREATININE 0.9 0.90 0.84  CALCIUM 9.4 9.6 8.9   CBC:  Recent Labs  08/03/14 1445 08/13/14 0501  WBC 7.9  --   HGB 13.8 12.2  HCT 42.6 37.4  MCV 91.0  --   PLT 178  --    Lab Results  Component Value Date   HGBA1C 6.0 03/14/2014    Assessment/Plan  Right shoulder osteoarthritis  S/P right reverse total shoulder arthroplasty. Has f.u with orthopedics. Continue Percocet 5/325 mg 1-2 tabs q4h prn with tramadol 50 mg qhs for pain. continue Zanaflex 4 mg q8h prn for muscle spasm. Will have her work with physical therapy and occupational therapy team to help with gait training and muscle strengthening exercises.fall precautions. Skin care. Encourage to be out of bed.   Constipation On senna s 2 tab bid. Change this to once a day. Hydration encouraged  Candidiasis Candidal rash on abdominal fold and groin area. Clean area with soap and water. Let it air dry and then apply nystatin-triamcinolone 0.1% cream bid until resolves. Add fluconazole 150 mg po once a week x 3 weeks to help with the rash  Foot rash Above treatment should help with this as well  Hypertension Stable, continue Norvasc 5 mg daily and metoprolol 25 mg bid, monitor bp  Hyperlipidemia continue Crestor 40 mg daily  GERD continue Prilosec 20 mg bid and monitor  Mood disorder continue Cymbalta 60 mg bid   Goals of care: short term  rehabilitation   Family/ staff Communication: reviewed care plan with patient and nursing supervisor    Blanchie Serve, MD  [facility] [phone] (Monday-Friday 8 am - 5 pm) [phone] (afterhours)

## 2014-08-24 DIAGNOSIS — Z96611 Presence of right artificial shoulder joint: Secondary | ICD-10-CM | POA: Diagnosis not present

## 2014-08-24 DIAGNOSIS — Z471 Aftercare following joint replacement surgery: Secondary | ICD-10-CM | POA: Diagnosis not present

## 2014-09-01 ENCOUNTER — Non-Acute Institutional Stay (SKILLED_NURSING_FACILITY): Payer: Medicare Other | Admitting: Adult Health

## 2014-09-01 DIAGNOSIS — K219 Gastro-esophageal reflux disease without esophagitis: Secondary | ICD-10-CM

## 2014-09-01 DIAGNOSIS — K5900 Constipation, unspecified: Secondary | ICD-10-CM

## 2014-09-01 DIAGNOSIS — M19011 Primary osteoarthritis, right shoulder: Secondary | ICD-10-CM | POA: Diagnosis not present

## 2014-09-01 DIAGNOSIS — I10 Essential (primary) hypertension: Secondary | ICD-10-CM

## 2014-09-01 DIAGNOSIS — F4323 Adjustment disorder with mixed anxiety and depressed mood: Secondary | ICD-10-CM

## 2014-09-01 DIAGNOSIS — E785 Hyperlipidemia, unspecified: Secondary | ICD-10-CM | POA: Diagnosis not present

## 2014-09-02 ENCOUNTER — Encounter: Payer: Self-pay | Admitting: Adult Health

## 2014-09-02 NOTE — Progress Notes (Signed)
Patient ID: [name] [name], female   DOB: May 12, 1940, 74 y.o.   MRN: [id number]   09/01/14  Facility:  [facility] Location:  [place] Room Number: 408-P LEVEL OF CARE:  SNF (31)   Chief Complaint  Patient presents with  . Discharge Note    Osteoarthritis S/P right reverse total shoulder arthroplasty, hypertension, hyperlipidemia, Adjustment disorder with mixed anxiety and depressed mood and GERD    HISTORY OF PRESENT ILLNESS:  This is a 73 year old female who is for discharge home with [facility] for surgical site monitor and disease management, PT for fall risk reduction and ambulation and OT for self-care and home management post-op shoulder replacement. She has been admitted to [facility] on 08/15/14 from [facility] with osteoarthritis S/P right reverse total shoulder arthroplasty. She has PMH of anxiety, GERD, hyperlipidemia, hypertension, arthritis and basal cell CA.  Patient was admitted to this facility for short-term rehabilitation after the patient's recent hospitalization.  Patient has completed SNF rehabilitation and therapy has cleared the patient for discharge.  PAST MEDICAL HISTORY:  Past Medical History  Diagnosis Date  . Anxiety   . GERD (gastroesophageal reflux disease)   . Hyperlipidemia   . Hypertension   . Arthritis   . Chronic kidney disease     stones  . Osteopenia   . Hiatal hernia   . Diverticulosis of colon (without mention of hemorrhage)   . Allergy   . Cancer     Basal cell  . Cataract   . Depression   . ACUTE KIDNEY FAILURE UNSPECIFIED 07/12/2009  . CAD (coronary artery disease)     Mild nonobstructive by CT  . Fibromyalgia     CURRENT MEDICATIONS: Reviewed per MAR/see medication list  Allergies  Allergen Reactions  . Lactose Intolerance (Gi) Other (See Comments)    Gi upset  . Meloxicam     REACTION: acute renal failure  with hypovolemic trigger Hospital  3 2011  . Sulfamethoxazole Hives       REVIEW OF SYSTEMS:  GENERAL: no change in appetite, no fatigue, no weight changes, no fever, chills or weakness RESPIRATORY: no cough, SOB, DOE, wheezing, hemoptysis CARDIAC: no chest pain, or palpitations GI: no abdominal pain, diarrhea,  heart burn, nausea or vomiting, +constipation  PHYSICAL EXAMINATION  GENERAL: no acute distress, normal body habitus SKIN:  Right shoulder surgical wound is dry, no redness NECK: supple, trachea midline, no neck masses, no thyroid tenderness, no thyromegaly LYMPHATICS: no LAN in the neck, no supraclavicular LAN RESPIRATORY: breathing is even & unlabored, BS CTAB CARDIAC: RRR, no murmur,no extra heart sounds, BLE edema 2+ GI: abdomen soft, normal BS, no masses, no tenderness, no hepatomegaly, no splenomegaly EXTREMITIES: able to move X 4 extremities with limited movement on RUE PSYCHIATRIC: the patient is alert & oriented to person, affect & behavior appropriate  LABS/RADIOLOGY: Labs reviewed: 08/29/14  bilateral lower extremity duplex venous ultrasound is negative for DVT 08/17/14  WBC 4.8 hemoglobin 11.5 hematocrit 30.8 MCV 87.1 sodium 139 potassium 4.2 glucose 80 BUN 14 creatinine 0.88 calcium 8.6 Basic Metabolic Panel:  Recent Labs  03/14/14 0917 08/03/14 1445 08/13/14 0501  NA 141 139 137  K 4.5 4.3 3.9  CL 107 104 105  CO2 27 27 23   GLUCOSE 124* 106* 130*  BUN 16 15 12   CREATININE 0.9 0.90 0.84  CALCIUM 9.4 9.6 8.9   CBC:  Recent Labs  08/03/14 1445 08/13/14 0501  WBC 7.9  --  HGB 13.8 12.2  HCT 42.6 37.4  MCV 91.0  --   PLT 178  --     Dg Shoulder Right Port  08/12/2014   CLINICAL DATA:  Post right shoulder replacement  EXAM: PORTABLE RIGHT SHOULDER - 2+ VIEW  COMPARISON:  None.  FINDINGS: Two views of the right shoulder submitted. There is right shoulder prosthesis in anatomic alignment. Small amount of periarticular soft tissue air.  IMPRESSION: Right shoulder prosthesis in anatomic alignment. Postsurgical changes  with small amount of periarticular soft tissue air.   Electronically Signed   By: Lahoma Crocker M.D.   On: 08/12/2014 11:41    ASSESSMENT/PLAN:  Osteoarthritis S/P right reverse total shoulder arthroplasty - for Home health PT, OT and Nursing; continue Percocet 5/325 mg 1-2 tabs by mouth every 4 hours when necessary and tramadol 50 mg by mouth daily at bedtime for pain; Zanaflex 4 mg 1 tab by mouth every 8 hours when necessary for muscle spasm; follow-up with Dr. Veverly Fells, ortho surgeon, in 4 weeks Hypertension - well controlled; continue Norvasc 5 mg by mouth daily and metoprolol 25 mg by mouth twice a day Hyperlipidemia - continue Crestor 40 mg by mouth daily GERD - continue Prilosec 20 mg by mouth twice a day Adjustment disorder with mixed anxiety and depressed mood - mood is stable; continue Cymbalta 60 mg by mouth twice a day; patient reports being in this dose for a year and works well for her, she is being followed up by her PCP, Dr. Shanon Ace Constipation - continue senna S2 tabs by mouth twice a day   I have filled out patient's discharge paperwork and written prescriptions.  Patient will receive home health PT, OT and Nursing.  Total discharge time: Less than 30 minutes  Discharge time involved coordination of the discharge process with Education officer, museum, nursing staff and therapy department. Medical justification for home health services verified.   [facility], NP [employer] [phone]

## 2014-09-08 DIAGNOSIS — I10 Essential (primary) hypertension: Secondary | ICD-10-CM | POA: Diagnosis not present

## 2014-09-08 DIAGNOSIS — M19011 Primary osteoarthritis, right shoulder: Secondary | ICD-10-CM | POA: Diagnosis not present

## 2014-09-08 DIAGNOSIS — R2681 Unsteadiness on feet: Secondary | ICD-10-CM | POA: Diagnosis not present

## 2014-09-08 DIAGNOSIS — F418 Other specified anxiety disorders: Secondary | ICD-10-CM | POA: Diagnosis not present

## 2014-09-08 DIAGNOSIS — M6281 Muscle weakness (generalized): Secondary | ICD-10-CM | POA: Diagnosis not present

## 2014-09-08 DIAGNOSIS — Z471 Aftercare following joint replacement surgery: Secondary | ICD-10-CM | POA: Diagnosis not present

## 2014-09-08 DIAGNOSIS — Z96611 Presence of right artificial shoulder joint: Secondary | ICD-10-CM | POA: Diagnosis not present

## 2014-09-12 DIAGNOSIS — Z471 Aftercare following joint replacement surgery: Secondary | ICD-10-CM | POA: Diagnosis not present

## 2014-09-12 DIAGNOSIS — M6281 Muscle weakness (generalized): Secondary | ICD-10-CM | POA: Diagnosis not present

## 2014-09-12 DIAGNOSIS — R2681 Unsteadiness on feet: Secondary | ICD-10-CM | POA: Diagnosis not present

## 2014-09-12 DIAGNOSIS — Z96611 Presence of right artificial shoulder joint: Secondary | ICD-10-CM | POA: Diagnosis not present

## 2014-09-12 DIAGNOSIS — I10 Essential (primary) hypertension: Secondary | ICD-10-CM | POA: Diagnosis not present

## 2014-09-12 DIAGNOSIS — M19011 Primary osteoarthritis, right shoulder: Secondary | ICD-10-CM | POA: Diagnosis not present

## 2014-09-12 DIAGNOSIS — F418 Other specified anxiety disorders: Secondary | ICD-10-CM | POA: Diagnosis not present

## 2014-09-13 DIAGNOSIS — F418 Other specified anxiety disorders: Secondary | ICD-10-CM | POA: Diagnosis not present

## 2014-09-13 DIAGNOSIS — R2681 Unsteadiness on feet: Secondary | ICD-10-CM | POA: Diagnosis not present

## 2014-09-13 DIAGNOSIS — M6281 Muscle weakness (generalized): Secondary | ICD-10-CM | POA: Diagnosis not present

## 2014-09-13 DIAGNOSIS — I10 Essential (primary) hypertension: Secondary | ICD-10-CM | POA: Diagnosis not present

## 2014-09-13 DIAGNOSIS — Z471 Aftercare following joint replacement surgery: Secondary | ICD-10-CM | POA: Diagnosis not present

## 2014-09-13 DIAGNOSIS — M19011 Primary osteoarthritis, right shoulder: Secondary | ICD-10-CM | POA: Diagnosis not present

## 2014-09-13 DIAGNOSIS — Z96611 Presence of right artificial shoulder joint: Secondary | ICD-10-CM | POA: Diagnosis not present

## 2014-09-14 ENCOUNTER — Telehealth: Payer: Self-pay | Admitting: Internal Medicine

## 2014-09-14 ENCOUNTER — Other Ambulatory Visit: Payer: Self-pay | Admitting: Internal Medicine

## 2014-09-14 DIAGNOSIS — R2681 Unsteadiness on feet: Secondary | ICD-10-CM | POA: Diagnosis not present

## 2014-09-14 DIAGNOSIS — I10 Essential (primary) hypertension: Secondary | ICD-10-CM | POA: Diagnosis not present

## 2014-09-14 DIAGNOSIS — Z96611 Presence of right artificial shoulder joint: Secondary | ICD-10-CM | POA: Diagnosis not present

## 2014-09-14 DIAGNOSIS — Z471 Aftercare following joint replacement surgery: Secondary | ICD-10-CM | POA: Diagnosis not present

## 2014-09-14 DIAGNOSIS — M6281 Muscle weakness (generalized): Secondary | ICD-10-CM | POA: Diagnosis not present

## 2014-09-14 DIAGNOSIS — M19011 Primary osteoarthritis, right shoulder: Secondary | ICD-10-CM | POA: Diagnosis not present

## 2014-09-14 DIAGNOSIS — F418 Other specified anxiety disorders: Secondary | ICD-10-CM | POA: Diagnosis not present

## 2014-09-14 NOTE — Telephone Encounter (Signed)
[name] [name] call from [facility] for approval for physical therapy  3 times a week for 2 weeks and 2 times a week for 2 weeks   Phone number [phone]

## 2014-09-14 NOTE — Telephone Encounter (Signed)
Sent to the pharmacy by e-scribe.  Refilled for 3 months.

## 2014-09-16 DIAGNOSIS — Z471 Aftercare following joint replacement surgery: Secondary | ICD-10-CM | POA: Diagnosis not present

## 2014-09-16 DIAGNOSIS — F418 Other specified anxiety disorders: Secondary | ICD-10-CM | POA: Diagnosis not present

## 2014-09-16 DIAGNOSIS — M19011 Primary osteoarthritis, right shoulder: Secondary | ICD-10-CM | POA: Diagnosis not present

## 2014-09-16 DIAGNOSIS — M6281 Muscle weakness (generalized): Secondary | ICD-10-CM | POA: Diagnosis not present

## 2014-09-16 DIAGNOSIS — I10 Essential (primary) hypertension: Secondary | ICD-10-CM | POA: Diagnosis not present

## 2014-09-16 DIAGNOSIS — R2681 Unsteadiness on feet: Secondary | ICD-10-CM | POA: Diagnosis not present

## 2014-09-16 DIAGNOSIS — Z96611 Presence of right artificial shoulder joint: Secondary | ICD-10-CM | POA: Diagnosis not present

## 2014-09-17 NOTE — Telephone Encounter (Signed)
Patient is post op for her shoulder surgery Dr Veverly Fells  Please direct any requests for orders  To his office

## 2014-09-20 DIAGNOSIS — M6281 Muscle weakness (generalized): Secondary | ICD-10-CM | POA: Diagnosis not present

## 2014-09-20 DIAGNOSIS — Z471 Aftercare following joint replacement surgery: Secondary | ICD-10-CM | POA: Diagnosis not present

## 2014-09-20 DIAGNOSIS — I10 Essential (primary) hypertension: Secondary | ICD-10-CM | POA: Diagnosis not present

## 2014-09-20 DIAGNOSIS — R2681 Unsteadiness on feet: Secondary | ICD-10-CM | POA: Diagnosis not present

## 2014-09-20 DIAGNOSIS — F418 Other specified anxiety disorders: Secondary | ICD-10-CM | POA: Diagnosis not present

## 2014-09-20 DIAGNOSIS — M19011 Primary osteoarthritis, right shoulder: Secondary | ICD-10-CM | POA: Diagnosis not present

## 2014-09-20 DIAGNOSIS — Z96611 Presence of right artificial shoulder joint: Secondary | ICD-10-CM | POA: Diagnosis not present

## 2014-09-20 NOTE — Telephone Encounter (Signed)
Left a message for return call.  

## 2014-09-21 DIAGNOSIS — M19011 Primary osteoarthritis, right shoulder: Secondary | ICD-10-CM | POA: Diagnosis not present

## 2014-09-21 DIAGNOSIS — M6281 Muscle weakness (generalized): Secondary | ICD-10-CM | POA: Diagnosis not present

## 2014-09-21 DIAGNOSIS — Z96611 Presence of right artificial shoulder joint: Secondary | ICD-10-CM | POA: Diagnosis not present

## 2014-09-21 DIAGNOSIS — R2681 Unsteadiness on feet: Secondary | ICD-10-CM | POA: Diagnosis not present

## 2014-09-21 DIAGNOSIS — Z471 Aftercare following joint replacement surgery: Secondary | ICD-10-CM | POA: Diagnosis not present

## 2014-09-21 DIAGNOSIS — F418 Other specified anxiety disorders: Secondary | ICD-10-CM | POA: Diagnosis not present

## 2014-09-21 DIAGNOSIS — I10 Essential (primary) hypertension: Secondary | ICD-10-CM | POA: Diagnosis not present

## 2014-09-21 NOTE — Telephone Encounter (Signed)
Spoke to [facility].  Advised that any orders go through Dr. Veverly Fells' office.

## 2014-09-23 DIAGNOSIS — I10 Essential (primary) hypertension: Secondary | ICD-10-CM | POA: Diagnosis not present

## 2014-09-23 DIAGNOSIS — M19011 Primary osteoarthritis, right shoulder: Secondary | ICD-10-CM | POA: Diagnosis not present

## 2014-09-23 DIAGNOSIS — F418 Other specified anxiety disorders: Secondary | ICD-10-CM | POA: Diagnosis not present

## 2014-09-23 DIAGNOSIS — M6281 Muscle weakness (generalized): Secondary | ICD-10-CM | POA: Diagnosis not present

## 2014-09-23 DIAGNOSIS — Z96611 Presence of right artificial shoulder joint: Secondary | ICD-10-CM | POA: Diagnosis not present

## 2014-09-23 DIAGNOSIS — R2681 Unsteadiness on feet: Secondary | ICD-10-CM | POA: Diagnosis not present

## 2014-09-23 DIAGNOSIS — Z471 Aftercare following joint replacement surgery: Secondary | ICD-10-CM | POA: Diagnosis not present

## 2014-09-26 DIAGNOSIS — R2681 Unsteadiness on feet: Secondary | ICD-10-CM | POA: Diagnosis not present

## 2014-09-26 DIAGNOSIS — Z471 Aftercare following joint replacement surgery: Secondary | ICD-10-CM | POA: Diagnosis not present

## 2014-09-26 DIAGNOSIS — M19011 Primary osteoarthritis, right shoulder: Secondary | ICD-10-CM | POA: Diagnosis not present

## 2014-09-26 DIAGNOSIS — M6281 Muscle weakness (generalized): Secondary | ICD-10-CM | POA: Diagnosis not present

## 2014-09-26 DIAGNOSIS — Z96611 Presence of right artificial shoulder joint: Secondary | ICD-10-CM | POA: Diagnosis not present

## 2014-09-26 DIAGNOSIS — F418 Other specified anxiety disorders: Secondary | ICD-10-CM | POA: Diagnosis not present

## 2014-09-26 DIAGNOSIS — I10 Essential (primary) hypertension: Secondary | ICD-10-CM | POA: Diagnosis not present

## 2014-09-28 DIAGNOSIS — M6281 Muscle weakness (generalized): Secondary | ICD-10-CM | POA: Diagnosis not present

## 2014-09-28 DIAGNOSIS — Z471 Aftercare following joint replacement surgery: Secondary | ICD-10-CM | POA: Diagnosis not present

## 2014-09-28 DIAGNOSIS — M19011 Primary osteoarthritis, right shoulder: Secondary | ICD-10-CM | POA: Diagnosis not present

## 2014-09-28 DIAGNOSIS — I10 Essential (primary) hypertension: Secondary | ICD-10-CM | POA: Diagnosis not present

## 2014-09-28 DIAGNOSIS — Z96611 Presence of right artificial shoulder joint: Secondary | ICD-10-CM | POA: Diagnosis not present

## 2014-09-28 DIAGNOSIS — F418 Other specified anxiety disorders: Secondary | ICD-10-CM | POA: Diagnosis not present

## 2014-09-28 DIAGNOSIS — R2681 Unsteadiness on feet: Secondary | ICD-10-CM | POA: Diagnosis not present

## 2014-09-29 DIAGNOSIS — M25561 Pain in right knee: Secondary | ICD-10-CM | POA: Diagnosis not present

## 2014-09-29 DIAGNOSIS — M19011 Primary osteoarthritis, right shoulder: Secondary | ICD-10-CM | POA: Diagnosis not present

## 2014-09-30 DIAGNOSIS — Z96611 Presence of right artificial shoulder joint: Secondary | ICD-10-CM | POA: Diagnosis not present

## 2014-09-30 DIAGNOSIS — M19011 Primary osteoarthritis, right shoulder: Secondary | ICD-10-CM | POA: Diagnosis not present

## 2014-09-30 DIAGNOSIS — Z471 Aftercare following joint replacement surgery: Secondary | ICD-10-CM | POA: Diagnosis not present

## 2014-09-30 DIAGNOSIS — M6281 Muscle weakness (generalized): Secondary | ICD-10-CM | POA: Diagnosis not present

## 2014-09-30 DIAGNOSIS — R2681 Unsteadiness on feet: Secondary | ICD-10-CM | POA: Diagnosis not present

## 2014-09-30 DIAGNOSIS — F418 Other specified anxiety disorders: Secondary | ICD-10-CM | POA: Diagnosis not present

## 2014-09-30 DIAGNOSIS — I10 Essential (primary) hypertension: Secondary | ICD-10-CM | POA: Diagnosis not present

## 2014-10-03 DIAGNOSIS — I10 Essential (primary) hypertension: Secondary | ICD-10-CM | POA: Diagnosis not present

## 2014-10-03 DIAGNOSIS — F418 Other specified anxiety disorders: Secondary | ICD-10-CM | POA: Diagnosis not present

## 2014-10-03 DIAGNOSIS — Z471 Aftercare following joint replacement surgery: Secondary | ICD-10-CM | POA: Diagnosis not present

## 2014-10-03 DIAGNOSIS — R2681 Unsteadiness on feet: Secondary | ICD-10-CM | POA: Diagnosis not present

## 2014-10-03 DIAGNOSIS — M19011 Primary osteoarthritis, right shoulder: Secondary | ICD-10-CM | POA: Diagnosis not present

## 2014-10-03 DIAGNOSIS — M6281 Muscle weakness (generalized): Secondary | ICD-10-CM | POA: Diagnosis not present

## 2014-10-03 DIAGNOSIS — Z96611 Presence of right artificial shoulder joint: Secondary | ICD-10-CM | POA: Diagnosis not present

## 2014-10-05 ENCOUNTER — Other Ambulatory Visit (INDEPENDENT_AMBULATORY_CARE_PROVIDER_SITE_OTHER): Payer: Medicare Other

## 2014-10-05 ENCOUNTER — Other Ambulatory Visit: Payer: Self-pay

## 2014-10-05 DIAGNOSIS — E785 Hyperlipidemia, unspecified: Secondary | ICD-10-CM | POA: Diagnosis not present

## 2014-10-05 DIAGNOSIS — Z96611 Presence of right artificial shoulder joint: Secondary | ICD-10-CM | POA: Diagnosis not present

## 2014-10-05 DIAGNOSIS — Z0000 Encounter for general adult medical examination without abnormal findings: Secondary | ICD-10-CM

## 2014-10-05 DIAGNOSIS — M6281 Muscle weakness (generalized): Secondary | ICD-10-CM | POA: Diagnosis not present

## 2014-10-05 DIAGNOSIS — I10 Essential (primary) hypertension: Secondary | ICD-10-CM | POA: Diagnosis not present

## 2014-10-05 DIAGNOSIS — R2681 Unsteadiness on feet: Secondary | ICD-10-CM | POA: Diagnosis not present

## 2014-10-05 DIAGNOSIS — M19011 Primary osteoarthritis, right shoulder: Secondary | ICD-10-CM | POA: Diagnosis not present

## 2014-10-05 DIAGNOSIS — F418 Other specified anxiety disorders: Secondary | ICD-10-CM | POA: Diagnosis not present

## 2014-10-05 DIAGNOSIS — Z471 Aftercare following joint replacement surgery: Secondary | ICD-10-CM | POA: Diagnosis not present

## 2014-10-05 LAB — BASIC METABOLIC PANEL
BUN: 16 mg/dL (ref 6–23)
CO2: 29 mEq/L (ref 19–32)
Calcium: 9.5 mg/dL (ref 8.4–10.5)
Chloride: 106 mEq/L (ref 96–112)
Creatinine, Ser: 0.83 mg/dL (ref 0.40–1.20)
GFR: 71.48 mL/min (ref >60.00)
Glucose, Bld: 116 mg/dL — ABNORMAL HIGH (ref 70–99)
POTASSIUM: 4.2 meq/L (ref 3.5–5.1)
Sodium: 139 mEq/L (ref 135–145)

## 2014-10-05 LAB — CBC WITH DIFFERENTIAL/PLATELET
BASOS ABS: 0 10*3/uL (ref 0.0–0.1)
Basophils Relative: 0.5 % (ref 0.0–3.0)
Eosinophils Absolute: 0.2 10*3/uL (ref 0.0–0.7)
Eosinophils Relative: 2.6 % (ref 0.0–5.0)
HCT: 42.4 % (ref 36.0–46.0)
Hemoglobin: 14 g/dL (ref 12.0–15.0)
Lymphocytes Relative: 22.1 % (ref 12.0–46.0)
Lymphs Abs: 1.5 10*3/uL (ref 0.7–4.0)
MCHC: 33 g/dL (ref 30.0–36.0)
MCV: 88.7 fl (ref 78.0–100.0)
MONOS PCT: 5.3 % (ref 3.0–12.0)
Monocytes Absolute: 0.4 10*3/uL (ref 0.1–1.0)
Neutro Abs: 4.8 10*3/uL (ref 1.4–7.7)
Neutrophils Relative %: 69.5 % (ref 43.0–77.0)
PLATELETS: 185 10*3/uL (ref 150.0–400.0)
RBC: 4.78 Mil/uL (ref 3.87–5.11)
RDW: 14.4 % (ref 11.5–15.5)
WBC: 6.9 10*3/uL (ref 4.0–10.5)

## 2014-10-05 LAB — HEPATIC FUNCTION PANEL
ALBUMIN: 3.9 g/dL (ref 3.5–5.2)
ALT: 13 U/L (ref 0–35)
AST: 16 U/L (ref 0–37)
Alkaline Phosphatase: 75 U/L (ref 39–117)
Bilirubin, Direct: 0.2 mg/dL (ref 0.0–0.3)
Total Bilirubin: 0.8 mg/dL (ref 0.2–1.2)
Total Protein: 6.6 g/dL (ref 6.0–8.3)

## 2014-10-05 LAB — LIPID PANEL
CHOL/HDL RATIO: 2
CHOLESTEROL: 155 mg/dL (ref 0–200)
HDL: 71.2 mg/dL (ref >39.00)
LDL CALC: 68 mg/dL (ref 0–99)
NonHDL: 83.8
Triglycerides: 78 mg/dL (ref 0.0–149.0)
VLDL: 15.6 mg/dL (ref 0.0–40.0)

## 2014-10-05 LAB — HEMOGLOBIN A1C: HEMOGLOBIN A1C: 5.7 % (ref 4.6–6.5)

## 2014-10-05 LAB — TSH: TSH: 1.2 u[IU]/mL (ref 0.35–4.50)

## 2014-10-10 DIAGNOSIS — M19011 Primary osteoarthritis, right shoulder: Secondary | ICD-10-CM | POA: Diagnosis not present

## 2014-10-12 ENCOUNTER — Encounter: Payer: Self-pay | Admitting: Internal Medicine

## 2014-10-12 ENCOUNTER — Ambulatory Visit (INDEPENDENT_AMBULATORY_CARE_PROVIDER_SITE_OTHER): Payer: Medicare Other | Admitting: Internal Medicine

## 2014-10-12 VITALS — BP 136/74 | Temp 98.3°F | Ht 60.5 in | Wt 166.8 lb

## 2014-10-12 DIAGNOSIS — M159 Polyosteoarthritis, unspecified: Secondary | ICD-10-CM

## 2014-10-12 DIAGNOSIS — M8949 Other hypertrophic osteoarthropathy, multiple sites: Secondary | ICD-10-CM

## 2014-10-12 DIAGNOSIS — R7301 Impaired fasting glucose: Secondary | ICD-10-CM

## 2014-10-12 DIAGNOSIS — Z79899 Other long term (current) drug therapy: Secondary | ICD-10-CM

## 2014-10-12 DIAGNOSIS — M150 Primary generalized (osteo)arthritis: Secondary | ICD-10-CM | POA: Diagnosis not present

## 2014-10-12 DIAGNOSIS — N189 Chronic kidney disease, unspecified: Secondary | ICD-10-CM

## 2014-10-12 DIAGNOSIS — Z0000 Encounter for general adult medical examination without abnormal findings: Secondary | ICD-10-CM

## 2014-10-12 DIAGNOSIS — I10 Essential (primary) hypertension: Secondary | ICD-10-CM

## 2014-10-12 NOTE — Patient Instructions (Addendum)
Continue lifestyle intervention healthy eating and exercise . Get system   A copy of your Advanced directive for the  Medical records .  Get your mammogram Check into shingles vaccine ( Zostavax) reimbursement or cost to you  and can return at any time if call ahead for injection.  See you in 6 months     Healthy lifestyle includes : At least 150 minutes of exercise weeks  , weight at healthy levels, which is usually   BMI 19-25. Avoid trans fats and processed foods;  Increase fresh fruits and veges to 5 servings per day. And avoid sweet beverages including tea and juice. Mediterranean diet with olive oil and nuts have been noted to be heart and brain healthy . Avoid tobacco products . Limit  alcohol to  7 per week for women and 14 servings for men.  Get adequate sleep . Wear seat belts . Don't text and drive .

## 2014-10-12 NOTE — Progress Notes (Signed)
Pre visit review using our clinic review tool, if applicable. No additional management support is needed unless otherwise documented below in the visit note.  Chief Complaint  Patient presents with  . Medicare Wellness    sp shoulder surgery  . Hypertension    HPI: EMRI SAMPLE 74 y.o. comes in today for Preventive Medicare wellness visit .  just jad shoulder replacment and home now pt  No complicaitons  HT doing well no change in meds  To haf tkr eventually . Lipids  On meds no sx  Trying to cut out sugars etc .   Health Maintenance  Topic Date Due  . FOOT EXAM  01/16/1951  . OPHTHALMOLOGY EXAM  01/16/1951  . URINE MICROALBUMIN  01/16/1951  . ZOSTAVAX  01/15/2001  . INFLUENZA VACCINE  11/21/2014  . MAMMOGRAM  03/16/2015  . HEMOGLOBIN A1C  04/06/2015  . TETANUS/TDAP  11/20/2017  . COLONOSCOPY  06/30/2018  . DEXA SCAN  Completed  . PNA vac Low Risk Adult  Completed   Health Maintenance Review LIFESTYLE:  Exercise:   Tobacco/ETS:no Alcohol: per day no Sugar beverages:no Sleep:ok Drug use: no  MEDICARE DOCUMENT QUESTIONS  TO SCAN     Hearing: ok left ear cracks whe off nad on will see ent doc .  Vision:  No limitations at present . Last eye check UTD  Safety:  Has smoke detector and wears seat belts.  No firearms. No excess sun exposure. Sees dentist regularly.  Falls: see screen   Advance directive :  Reviewed  Has one.  Memory: Felt to be good  , no concern from her or her family.  Depression: No anhedonia unusual crying or depressive symptoms  Nutrition: Eats well balanced diet; adequate calcium and vitamin D. No swallowing chewing problems.  Injury: no major injuries in the last six months.  Other healthcare providers:  Reviewed today .  Social:   Widowed . No pets.  Had stray cat had cat scratch  rx given away while l in hospital   Preventive parameters: up-to-date  Reviewed ndue for mammo  ADLS:   There are no problems or need for assistance   driving, feeding, obtaining food, dressing, toileting and bathing, managing money using phone. She is independent.   ROS:  See above GEN/ HEENT: No fever, significant weight changes sweats headaches vision problems hearing changes, CV/ PULM; No chest pain shortness of breath cough, syncope,edema  change in exercise tolerance. GI /GU: No adominal pain, vomiting, change in bowel habits. No blood in the stool. No significant GU symptoms. SKIN/HEME: ,no acute skin rashes suspicious lesions or bleeding. No lymphadenopathy, nodules, masses.  NEURO/ PSYCH:  No neurologic signs such as weakness numbness. No depression anxiety. IMM/ Allergy: No unusual infections.  Allergy .   REST of 12 system review negative except as per HPI   Past Medical History  Diagnosis Date  . Anxiety   . GERD (gastroesophageal reflux disease)   . Hyperlipidemia   . Hypertension   . Arthritis   . Chronic kidney disease     stones  . Osteopenia   . Hiatal hernia   . Diverticulosis of colon (without mention of hemorrhage)   . Allergy   . Cancer     Basal cell  . Cataract   . Depression   . ACUTE KIDNEY FAILURE UNSPECIFIED 07/12/2009  . CAD (coronary artery disease)     Mild nonobstructive by CT  . Fibromyalgia    Past Surgical History  Procedure Laterality  Date  . Cholecystectomy    . Total knee arthroplasty      left  . Cystectomy  2001    benign  . Bladder tumor excision  2001  . Tendon repair  11/2005    right shoulder  . Lumbar disc surgery      x 2 el[facility]ner  . Cataract extraction      bilateral  . Esophageal manometry N/A 07/13/2012    Procedure: ESOPHAGEAL MANOMETRY (EM);  Surgeon: Sable Feil, MD;  Location: [facility];  Service: Endoscopy;  Laterality: N/A;  . Reverse shoulder arthroplasty Right 08/12/2014    Procedure: RIGHT REVERSE TOTAL SHOULDER ARTHROPLASTY;  Surgeon: Netta Cedars, MD;  Location: [facility];  Service: Orthopedics;  Laterality: Right;     Family History  Problem  Relation Age of Onset  . Lung cancer Father     tobacco  . Kidney cancer Son   . Hypertension Brother     mom's side  . Alzheimer's disease Mother   . Osteoporosis Sister   . Breast cancer Cousin   . Colon cancer Neg Hx   . Heart disease Maternal Uncle     x 4  . Heart disease Maternal Aunt     x 4  . Diabetes Paternal Grandmother   . Diabetes Paternal Uncle     x 2    History   Social History  . Marital Status: Widowed    Spouse Name: N/A  . Number of Children: 2  . Years of Education: N/A   Occupational History  . Retired     Social History Main Topics  . Smoking status: Never Smoker   . Smokeless tobacco: Never Used  . Alcohol Use: 0.0 oz/week    0 Standard drinks or equivalent per week     Comment: rare  . Drug Use: No  . Sexual Activity: Not on file   Other Topics Concern  . 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 amLODipine (NORVASC) 5 MG tablet TAKE 1 TABLET BY MOUTH EVERY DAY  . CRESTOR 40 MG tablet TAKE 1 TABLET BY MOUTH EVERY DAY  . Cyanocobalamin (B-12 PO) Take 1 tablet by mouth daily.   . DULoxetine (CYMBALTA) 60 MG capsule Take 1 capsule (60 mg total) by mouth 2 (two) times daily.  . metoprolol (LOPRESSOR) 50 MG tablet Take 0.5 tablets (25 mg total) by mouth 2 (two) times daily.  Marland Kitchen omeprazole (PRILOSEC) 20 MG capsule TAKE ONE CAPSULE BY MOUTH TWICE A DAY  . oxyCODONE-acetaminophen (ROXICET) 5-325 MG per tablet Take 1-2 tablets by mouth every 4 (four) hours as needed for severe pain.  . Probiotic Product (ALIGN PO) Take 1 tablet by mouth daily.  Marland Kitchen tiZANidine (ZANAFLEX) 4 MG tablet Take 1 tablet (4 mg total) by mouth every 6 (six) hours as needed for muscle spasms.  . traMADol (ULTRAM) 50 MG tablet Take 50 mg by mouth. Taking one at night.  Marland Kitchen albuterol (PROVENTIL HFA;VENTOLIN HFA) 108 (90 BASE) MCG/ACT  inhaler Inhale 2 puffs into the lungs every 6 (six) hours as needed for wheezing or shortness of breath.   No facility-administered encounter medications on file as of 10/12/2014.    EXAM:  BP 136/74 mmHg  Temp(Src) 98.3 F (36.8 C) (Oral)  Ht 5' 0.5" (1.537 m)  Wt 166 lb 12.8 oz (  75.66 kg)  BMI 32.03 kg/m2  Body mass index is 32.03 kg/(m^2).  Physical Exam: Vital signs reviewed MOL:MBEM is a well-developed well-nourished alert cooperative   who appears stated age in no acute distress.  HEENT: normocephalic atraumatic , Eyes: PERRL EOM's full, conjunctiva clear, Nares: paten,t no deformity discharge or tenderness., Ears: no deformity EAC's clear TMs with normal landmarks. Mouth: clear OP, no lesions, edema.  Moist mucous membranes. Dentition in adequate repair. NECK: supple without masses, thyromegaly or bruits. CHEST/PULM:  Clear to auscultation and percussion breath sounds equal no wheeze , rales or rhonchi. No chest wall deformities or tenderness.Breast: normal by inspection  Large pendulous. No dimpling, discharge, masses, tenderness or discharge . CV: PMI is nondisplaced, S1 S2 no gallops, murmurs, rubs. Peripheral pulses are full without delay.No JVD .  ABDOMEN: Bowel sounds normal nontender  No guard or rebound, no hepato splenomegal no CVA tenderness.   Extremtities:  No clubbing cyanosis or edema, no acute joint swelling or redness no focal atrophy  Scar right shoulder healed  NEURO:  Oriented x3, cranial nerves 3-12 appear to be intact, no obvious focal weakness,gait within normal limits SKIN: No acute rashes normal turgor, color, no bruising or petechiae. PSYCH: Oriented, good eye contact, no obvious depression anxiety, cognition and judgment appear normal. LN: no cervical axillary inguinal adenopathy No noted deficits in memory, attention, and speech.   Lab Results  Component Value Date   WBC 6.9 10/05/2014   HGB 14.0 10/05/2014   HCT 42.4 10/05/2014   PLT 185.0  10/05/2014   GLUCOSE 116* 10/05/2014   CHOL 155 10/05/2014   TRIG 78.0 10/05/2014   HDL 71.20 10/05/2014   LDLDIRECT 179.1 10/09/2007   LDLCALC 68 10/05/2014   ALT 13 10/05/2014   AST 16 10/05/2014   NA 139 10/05/2014   K 4.2 10/05/2014   CL 106 10/05/2014   CREATININE 0.83 10/05/2014   BUN 16 10/05/2014   CO2 29 10/05/2014   TSH 1.20 10/05/2014   HGBA1C 5.7 10/05/2014    ASSESSMENT AND PLAN:  Discussed the following assessment and plan:  Visit for preventive health examination  Medication management  Medicare annual wellness visit, subsequent  Primary osteoarthritis involving multiple joints - cant take nsaids  spshoulder repl to have tkr later   Essential hypertension - controlled   Fasting hyperglycemia - no diabets  cont lsi  Td in 2016   Cat injury .    Infection hand  Gi takes align and  prilosec  Helps  .  Patient Care Team: Burnis Medin, MD as PCP - General Sable Feil, MD as Attending Physician (Gastroenterology) Sydnee Cabal, MD as Attending Physician (Orthopedic Surgery) Suella Broad, MD (Physical Medicine and Rehabilitation)  Patient Instructions  Continue lifestyle intervention healthy eating and exercise . Get system   A copy of your Advanced directive for the  Medical records .  Get your mammogram Check into shingles vaccine ( Zostavax) reimbursement or cost to you  and can return at any time if call ahead for injection.  See you in 6 months     Healthy lifestyle includes : At least 150 minutes of exercise weeks  , weight at healthy levels, which is usually   BMI 19-25. Avoid trans fats and processed foods;  Increase fresh fruits and veges to 5 servings per day. And avoid sweet beverages including tea and juice. Mediterranean diet with olive oil and nuts have been noted to be heart and brain healthy . Avoid tobacco products .  Limit  alcohol to  7 per week for women and 14 servings for men.  Get adequate sleep . Wear seat belts  . Don't text and drive .       Standley Brooking. Nyan Dufresne M.D.

## 2014-10-13 DIAGNOSIS — M19011 Primary osteoarthritis, right shoulder: Secondary | ICD-10-CM | POA: Diagnosis not present

## 2014-10-18 DIAGNOSIS — H00025 Hordeolum internum left lower eyelid: Secondary | ICD-10-CM | POA: Diagnosis not present

## 2014-10-25 DIAGNOSIS — M19011 Primary osteoarthritis, right shoulder: Secondary | ICD-10-CM | POA: Diagnosis not present

## 2014-10-28 DIAGNOSIS — M19011 Primary osteoarthritis, right shoulder: Secondary | ICD-10-CM | POA: Diagnosis not present

## 2014-11-04 DIAGNOSIS — M19011 Primary osteoarthritis, right shoulder: Secondary | ICD-10-CM | POA: Diagnosis not present

## 2014-11-08 DIAGNOSIS — M19011 Primary osteoarthritis, right shoulder: Secondary | ICD-10-CM | POA: Diagnosis not present

## 2014-11-09 DIAGNOSIS — Z961 Presence of intraocular lens: Secondary | ICD-10-CM | POA: Diagnosis not present

## 2014-11-09 DIAGNOSIS — H00025 Hordeolum internum left lower eyelid: Secondary | ICD-10-CM | POA: Diagnosis not present

## 2014-11-10 DIAGNOSIS — M19011 Primary osteoarthritis, right shoulder: Secondary | ICD-10-CM | POA: Diagnosis not present

## 2014-11-10 DIAGNOSIS — Z96611 Presence of right artificial shoulder joint: Secondary | ICD-10-CM | POA: Diagnosis not present

## 2014-11-10 DIAGNOSIS — Z471 Aftercare following joint replacement surgery: Secondary | ICD-10-CM | POA: Diagnosis not present

## 2014-11-11 DIAGNOSIS — M19011 Primary osteoarthritis, right shoulder: Secondary | ICD-10-CM | POA: Diagnosis not present

## 2014-11-15 DIAGNOSIS — M19011 Primary osteoarthritis, right shoulder: Secondary | ICD-10-CM | POA: Diagnosis not present

## 2014-11-22 DIAGNOSIS — M19011 Primary osteoarthritis, right shoulder: Secondary | ICD-10-CM | POA: Diagnosis not present

## 2014-12-24 ENCOUNTER — Other Ambulatory Visit: Payer: Self-pay | Admitting: Internal Medicine

## 2014-12-27 NOTE — Telephone Encounter (Signed)
Sent to the pharmacy by e-scribe. 

## 2015-01-21 ENCOUNTER — Other Ambulatory Visit: Payer: Self-pay | Admitting: Internal Medicine

## 2015-01-23 NOTE — Telephone Encounter (Signed)
Sent to the pharmacy by e-scribe. 

## 2015-02-02 ENCOUNTER — Other Ambulatory Visit: Payer: Self-pay | Admitting: Internal Medicine

## 2015-02-02 NOTE — Telephone Encounter (Signed)
Omeprazole filled on 12/27/14 Metoprolol sent by e-scribe.

## 2015-05-10 ENCOUNTER — Other Ambulatory Visit: Payer: Self-pay | Admitting: Internal Medicine

## 2015-05-11 ENCOUNTER — Telehealth: Payer: Self-pay | Admitting: Family Medicine

## 2015-05-11 ENCOUNTER — Other Ambulatory Visit: Payer: Self-pay | Admitting: Family Medicine

## 2015-05-11 DIAGNOSIS — I10 Essential (primary) hypertension: Secondary | ICD-10-CM

## 2015-05-11 DIAGNOSIS — Z79899 Other long term (current) drug therapy: Secondary | ICD-10-CM

## 2015-05-11 NOTE — Telephone Encounter (Signed)
Cymbalta sent to the pharmacy by e-scribe for 1 month.  Pt now due for lab work and a follow up.  Metoprolol filled for 6 months in Oct 2016.  That request was denied.  Request is too soon. Message sent to scheduling to help get the pt scheduled for lab work and a follow up.

## 2015-05-11 NOTE — Telephone Encounter (Signed)
Pt should have returned for lab work and a follow up in Dec.  Please contact the pt to make these appointments.  I have placed the lab orders.  Thanks!

## 2015-05-12 NOTE — Telephone Encounter (Signed)
Pt has been sch

## 2015-05-22 ENCOUNTER — Other Ambulatory Visit: Payer: Self-pay

## 2015-05-22 ENCOUNTER — Other Ambulatory Visit: Payer: Self-pay | Admitting: Family Medicine

## 2015-05-22 ENCOUNTER — Telehealth: Payer: Self-pay | Admitting: Internal Medicine

## 2015-05-22 DIAGNOSIS — R350 Frequency of micturition: Secondary | ICD-10-CM

## 2015-05-22 DIAGNOSIS — R300 Dysuria: Secondary | ICD-10-CM

## 2015-05-22 NOTE — Telephone Encounter (Signed)
add Ua and culture  specimen if abnormal ua

## 2015-05-22 NOTE — Telephone Encounter (Signed)
Pt coming in for lab appt tomorrow, states she is having frequent urge to urinate and is requesting an order to have u/a performed tomorrow as well when she comes in for lab work.

## 2015-05-22 NOTE — Telephone Encounter (Signed)
Spoke to the pt.  She stated she is having urinary frequency and dysuria.  Has had some fever last night.  She believes it was low grade.  Denied back pain or flank pain. Also has some runny nose, sneezing, headache and congestion.   Not sure if low grade fever is due to possible UTI. Please advise.  Thanks!

## 2015-05-22 NOTE — Telephone Encounter (Signed)
Pt notified she will have urine lab tomorrow.

## 2015-05-23 ENCOUNTER — Other Ambulatory Visit (INDEPENDENT_AMBULATORY_CARE_PROVIDER_SITE_OTHER): Payer: Medicare Other

## 2015-05-23 DIAGNOSIS — Z79899 Other long term (current) drug therapy: Secondary | ICD-10-CM | POA: Diagnosis not present

## 2015-05-23 DIAGNOSIS — R350 Frequency of micturition: Secondary | ICD-10-CM

## 2015-05-23 DIAGNOSIS — R300 Dysuria: Secondary | ICD-10-CM | POA: Diagnosis not present

## 2015-05-23 DIAGNOSIS — I10 Essential (primary) hypertension: Secondary | ICD-10-CM

## 2015-05-23 LAB — BASIC METABOLIC PANEL
BUN: 17 mg/dL (ref 6–23)
CALCIUM: 9.4 mg/dL (ref 8.4–10.5)
CO2: 29 mEq/L (ref 19–32)
CREATININE: 0.89 mg/dL (ref 0.40–1.20)
Chloride: 109 mEq/L (ref 96–112)
GFR: 65.83 mL/min (ref >60.00)
Glucose, Bld: 94 mg/dL (ref 70–99)
Potassium: 4.6 mEq/L (ref 3.5–5.1)
Sodium: 145 mEq/L (ref 135–145)

## 2015-05-23 LAB — POCT URINALYSIS DIPSTICK
Glucose, UA: NEGATIVE
NITRITE UA: POSITIVE
PH UA: 5
UROBILINOGEN UA: 1

## 2015-05-24 ENCOUNTER — Other Ambulatory Visit: Payer: Self-pay | Admitting: Family Medicine

## 2015-05-24 MED ORDER — CEPHALEXIN 500 MG PO CAPS: 500.0000 mg | ORAL_CAPSULE | Freq: Three times a day (TID) | ORAL | Status: DC

## 2015-05-26 LAB — URINE CULTURE

## 2015-05-29 ENCOUNTER — Ambulatory Visit (INDEPENDENT_AMBULATORY_CARE_PROVIDER_SITE_OTHER): Payer: Medicare Other | Admitting: Internal Medicine

## 2015-05-29 ENCOUNTER — Encounter: Payer: Self-pay | Admitting: Internal Medicine

## 2015-05-29 VITALS — BP 116/64 | Temp 99.0°F | Wt 163.9 lb

## 2015-05-29 DIAGNOSIS — N189 Chronic kidney disease, unspecified: Secondary | ICD-10-CM | POA: Diagnosis not present

## 2015-05-29 DIAGNOSIS — N390 Urinary tract infection, site not specified: Secondary | ICD-10-CM

## 2015-05-29 DIAGNOSIS — Z79899 Other long term (current) drug therapy: Secondary | ICD-10-CM

## 2015-05-29 DIAGNOSIS — Z23 Encounter for immunization: Secondary | ICD-10-CM

## 2015-05-29 DIAGNOSIS — M159 Polyosteoarthritis, unspecified: Secondary | ICD-10-CM

## 2015-05-29 DIAGNOSIS — M150 Primary generalized (osteo)arthritis: Secondary | ICD-10-CM

## 2015-05-29 DIAGNOSIS — I10 Essential (primary) hypertension: Secondary | ICD-10-CM | POA: Diagnosis not present

## 2015-05-29 DIAGNOSIS — M8949 Other hypertrophic osteoarthropathy, multiple sites: Secondary | ICD-10-CM

## 2015-05-29 LAB — POCT URINALYSIS DIP (MANUAL ENTRY)
Bilirubin, UA: NEGATIVE
Glucose, UA: NEGATIVE
Ketones, POC UA: NEGATIVE
NITRITE UA: NEGATIVE
PH UA: 5.5
Protein Ur, POC: NEGATIVE
RBC UA: NEGATIVE
Spec Grav, UA: 1.025
UROBILINOGEN UA: 1

## 2015-05-29 NOTE — Progress Notes (Signed)
Pre visit review using our clinic review tool, if applicable. No additional management support is needed unless otherwise documented below in the visit note.  Chief Complaint  Patient presents with  . Follow-up    labs uti  . Hypertension    HPI: [name] [name] 75 y.o.  comes in for chronic disease/ medication management  Doing pretty good .  HT  :  bp ok   DJD oa msame  Knee is an issues   Surgery considering.  feell from knee  Hit r elbow  Sore right shoulder which had  Surgery replacement  [facility]  Had sneezing fits this weekend no  Fever now bvetgter took zyrtec and did well.  No cough   Had uti sx no fever  Cloudy smelly urine last week   Just finishihg antibiotic and feels almost back to normal   Keflex   ROS: See pertinent positives and negatives per HPI.  Past Medical History  Diagnosis Date  . Anxiety   . GERD (gastroesophageal reflux disease)   . Hyperlipidemia   . Hypertension   . Arthritis   . Chronic kidney disease     stones  . Osteopenia   . Hiatal hernia   . Diverticulosis of colon (without mention of hemorrhage)   . Allergy   . Cancer (HCC)     Basal cell  . Cataract   . Depression   . ACUTE KIDNEY FAILURE UNSPECIFIED 07/12/2009  . CAD (coronary artery disease)     Mild nonobstructive by CT  . Fibromyalgia     Family History  Problem Relation Age of Onset  . Lung cancer Father     tobacco  . Kidney cancer Son   . Hypertension Brother     mom's side  . Alzheimer's disease Mother   . Osteoporosis Sister   . Breast cancer Cousin   . Colon cancer Neg Hx   . Heart disease Maternal Uncle     x 4  . Heart disease Maternal Aunt     x 4  . Diabetes Paternal Grandmother   . Diabetes Paternal Uncle     x 2    Social History   Social History  . Marital Status: Widowed    Spouse Name: N/A  . Number of Children: 2  . Years of Education: N/A   Occupational History  . Retired     Social History Main Topics  . Smoking status: Never Smoker    . Smokeless tobacco: Never Used  . Alcohol Use: 0.0 oz/week    0 Standard drinks or equivalent per week     Comment: rare  . Drug Use: No  . Sexual Activity: Not Asked   Other Topics Concern  . 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 amLODipine (NORVASC) 5 MG tablet TAKE 1 TABLET BY MOUTH EVERY DAY 90 tablet 3  . amLODipine (NORVASC) 5 MG tablet TAKE 1 TABLET BY MOUTH EVERY DAY 90 tablet 1  . CRESTOR 40 MG tablet TAKE 1 TABLET BY MOUTH EVERY DAY 30 tablet 2  . DULoxetine (CYMBALTA) 60 MG capsule TAKE ONE CAPSULE BY MOUTH TWICE A DAY 60 capsule 0  . metoprolol (LOPRESSOR) 50 MG tablet TAKE 0.5 TABLETS (25 MG TOTAL) BY MOUTH 2 (TWO) TIMES DAILY. 90 tablet 1  . omeprazole (PRILOSEC) 20  MG capsule TAKE ONE CAPSULE BY MOUTH TWICE A DAY 180 capsule 2  . Probiotic Product (ALIGN PO) Take 1 tablet by mouth daily.    . traMADol (ULTRAM) 50 MG tablet Take 50 mg by mouth. Taking one at night.    Marland Kitchen albuterol (PROVENTIL HFA;VENTOLIN HFA) 108 (90 BASE) MCG/ACT inhaler Inhale 2 puffs into the lungs every 6 (six) hours as needed for wheezing or shortness of breath. (Patient not taking: Reported on 05/29/2015) 1 Inhaler 2  . Cyanocobalamin (B-12 PO) Take 1 tablet by mouth daily. Reported on 05/29/2015    . cephALEXin (KEFLEX) 500 MG capsule Take 1 capsule (500 mg total) by mouth 3 (three) times daily. 15 capsule 0  . oxyCODONE-acetaminophen (ROXICET) 5-325 MG per tablet Take 1-2 tablets by mouth every 4 (four) hours as needed for severe pain. 60 tablet 0  . tiZANidine (ZANAFLEX) 4 MG tablet Take 1 tablet (4 mg total) by mouth every 6 (six) hours as needed for muscle spasms. 30 tablet 0   No facility-administered medications prior to visit.     EXAM:  BP 116/64 mmHg  Temp(Src) 99 F (37.2 C) (Oral)  Wt 163 lb 14.4 oz (74.345 kg)  Body mass index  is 31.47 kg/(m^2).  GENERAL: vitals reviewed and listed above, alert, oriented, appears well hydrated and in no acute distress HEENT: atraumatic, conjunctiva  clear, no obvious abnormalities on inspection of external nose and ears  NECK: no obvious masses on inspection palpation  LUNGS: clear to auscultation bilaterally, no wheezes, rales or rhonchi, good air movement CV: HRRR, no clubbing cyanosis or  peripheral edema nl cap refill dry skin legs  Old changes  MS: moves all extremities without noticeable focal  Abnormality  Noted DJD OA deformity  PSYCH: pleasant and cooperative, no obvious depression or anxiety Lab Results  Component Value Date   WBC 6.9 10/05/2014   HGB 14.0 10/05/2014   HCT 42.4 10/05/2014   PLT 185.0 10/05/2014   GLUCOSE 94 05/23/2015   CHOL 155 10/05/2014   TRIG 78.0 10/05/2014   HDL 71.20 10/05/2014   LDLDIRECT 179.1 10/09/2007   LDLCALC 68 10/05/2014   ALT 13 10/05/2014   AST 16 10/05/2014   NA 145 05/23/2015   K 4.6 05/23/2015   CL 109 05/23/2015   CREATININE 0.89 05/23/2015   BUN 17 05/23/2015   CO2 29 05/23/2015   TSH 1.20 10/05/2014   HGBA1C 5.7 10/05/2014    ASSESSMENT AND PLAN:  Discussed the following assessment and plan:  Medication management  CKD (chronic kidney disease), unspecified stage - cr stable  belwo 1 today  Essential hypertension - controlled  Primary osteoarthritis involving multiple joints  Urinary tract infection, site not specified - at end of rx  senisitivities not in ehr  check ua then fu plan  - Plan: POCT urinalysis dipstick  Need for prophylactic vaccination and inoculation against influenza - Plan: Flu vaccine HIGH DOSE PF (Fluzone High dose)  Not a long term health concerna bout sneezing fit   prob allergy or irritant 6 months prevnetive with labs  See her ortho about knee shoulder Total visit 45mins > 50% spent counseling and coordinating care as indicated in above note and in instructions to patient .      -Patient advised to return or notify health care team  if symptoms worsen ,persist or new concerns arise.  Patient Instructions  Let us know if uti sx  reccur . Renal function is good .  Get urine  Check  today . Blood pressure is good . Today . Wellness visit and labs in 6 months .    Urinary Tract Infection Urinary tract infections (UTIs) can develop anywhere along your urinary tract. Your urinary tract is your body's drainage system for removing wastes and extra water. Your urinary tract includes two kidneys, two ureters, a bladder, and a urethra. Your kidneys are a pair of bean-shaped organs. Each kidney is about the size of your fist. They are located below your ribs, one on each side of your spine. CAUSES Infections are caused by microbes, which are microscopic organisms, including fungi, viruses, and bacteria. These organisms are so small that they can only be seen through a microscope. Bacteria are the microbes that most commonly cause UTIs. SYMPTOMS  Symptoms of UTIs may vary by age and gender of the patient and by the location of the infection. Symptoms in young women typically include a frequent and intense urge to urinate and a painful, burning feeling in the bladder or urethra during urination. Older women and men are more likely to be tired, shaky, and weak and have muscle aches and abdominal pain. A fever may mean the infection is in your kidneys. Other symptoms of a kidney infection include pain in your back or sides below the ribs, nausea, and vomiting. DIAGNOSIS To diagnose a UTI, your caregiver will ask you about your symptoms. Your caregiver will also ask you to provide a urine sample. The urine sample will be tested for bacteria and white blood cells. White blood cells are made by your body to help fight infection. TREATMENT  Typically, UTIs can be treated with medication. Because most UTIs are caused by a bacterial infection, they usually can be treated with the use of  antibiotics. The choice of antibiotic and length of treatment depend on your symptoms and the type of bacteria causing your infection. HOME CARE INSTRUCTIONS  If you were prescribed antibiotics, take them exactly as your caregiver instructs you. Finish the medication even if you feel better after you have only taken some of the medication.  Drink enough water and fluids to keep your urine clear or pale yellow.  Avoid caffeine, tea, and carbonated beverages. They tend to irritate your bladder.  Empty your bladder often. Avoid holding urine for long periods of time.  Empty your bladder before and after sexual intercourse.  After a bowel movement, women should cleanse from front to back. Use each tissue only once. SEEK MEDICAL CARE IF:   You have back pain.  You develop a fever.  Your symptoms do not begin to resolve within 3 days. SEEK IMMEDIATE MEDICAL CARE IF:   You have severe back pain or lower abdominal pain.  You develop chills.  You have nausea or vomiting.  You have continued burning or discomfort with urination. MAKE SURE YOU:   Understand these instructions.  Will watch your condition.  Will get help right away if you are not doing well or get worse.   This information is not intended to replace advice given to you by your health care provider. Make sure you discuss any questions you have with your health care provider.   Document Released: 01/16/2005 Document Revised: 12/28/2014 Document Reviewed: 05/17/2011 Elsevier Interactive Patient Education 2016 [place] K. Panosh M.D.

## 2015-05-29 NOTE — Patient Instructions (Signed)
Let us know if uti sx  reccur . Renal function is good .  Get urine  Check today . Blood pressure is good . Today . Wellness visit and labs in 6 months .    Urinary Tract Infection Urinary tract infections (UTIs) can develop anywhere along your urinary tract. Your urinary tract is your body's drainage system for removing wastes and extra water. Your urinary tract includes two kidneys, two ureters, a bladder, and a urethra. Your kidneys are a pair of bean-shaped organs. Each kidney is about the size of your fist. They are located below your ribs, one on each side of your spine. CAUSES Infections are caused by microbes, which are microscopic organisms, including fungi, viruses, and bacteria. These organisms are so small that they can only be seen through a microscope. Bacteria are the microbes that most commonly cause UTIs. SYMPTOMS  Symptoms of UTIs may vary by age and gender of the patient and by the location of the infection. Symptoms in young women typically include a frequent and intense urge to urinate and a painful, burning feeling in the bladder or urethra during urination. Older women and men are more likely to be tired, shaky, and weak and have muscle aches and abdominal pain. A fever may mean the infection is in your kidneys. Other symptoms of a kidney infection include pain in your back or sides below the ribs, nausea, and vomiting. DIAGNOSIS To diagnose a UTI, your caregiver will ask you about your symptoms. Your caregiver will also ask you to provide a urine sample. The urine sample will be tested for bacteria and white blood cells. White blood cells are made by your body to help fight infection. TREATMENT  Typically, UTIs can be treated with medication. Because most UTIs are caused by a bacterial infection, they usually can be treated with the use of antibiotics. The choice of antibiotic and length of treatment depend on your symptoms and the type of bacteria causing your  infection. HOME CARE INSTRUCTIONS  If you were prescribed antibiotics, take them exactly as your caregiver instructs you. Finish the medication even if you feel better after you have only taken some of the medication.  Drink enough water and fluids to keep your urine clear or pale yellow.  Avoid caffeine, tea, and carbonated beverages. They tend to irritate your bladder.  Empty your bladder often. Avoid holding urine for long periods of time.  Empty your bladder before and after sexual intercourse.  After a bowel movement, women should cleanse from front to back. Use each tissue only once. SEEK MEDICAL CARE IF:   You have back pain.  You develop a fever.  Your symptoms do not begin to resolve within 3 days. SEEK IMMEDIATE MEDICAL CARE IF:   You have severe back pain or lower abdominal pain.  You develop chills.  You have nausea or vomiting.  You have continued burning or discomfort with urination. MAKE SURE YOU:   Understand these instructions.  Will watch your condition.  Will get help right away if you are not doing well or get worse.   This information is not intended to replace advice given to you by your health care provider. Make sure you discuss any questions you have with your health care provider.   Document Released: 01/16/2005 Document Revised: 12/28/2014 Document Reviewed: 05/17/2011 Elsevier Interactive Patient Education [employer].

## 2015-05-31 ENCOUNTER — Other Ambulatory Visit: Payer: Self-pay | Admitting: Family Medicine

## 2015-05-31 MED ORDER — CEPHALEXIN 500 MG PO CAPS: 500.0000 mg | ORAL_CAPSULE | Freq: Three times a day (TID) | ORAL | Status: DC

## 2015-06-13 ENCOUNTER — Other Ambulatory Visit: Payer: Self-pay | Admitting: Internal Medicine

## 2015-06-13 NOTE — Telephone Encounter (Signed)
Sent to the pharmacy by e-scribe. 

## 2015-06-27 ENCOUNTER — Other Ambulatory Visit: Payer: Self-pay | Admitting: Internal Medicine

## 2015-06-27 DIAGNOSIS — Z471 Aftercare following joint replacement surgery: Secondary | ICD-10-CM | POA: Diagnosis not present

## 2015-06-27 DIAGNOSIS — Z96611 Presence of right artificial shoulder joint: Secondary | ICD-10-CM | POA: Diagnosis not present

## 2015-06-28 NOTE — Telephone Encounter (Signed)
Sent to the pharmacy by e-scribe. 

## 2015-07-15 ENCOUNTER — Other Ambulatory Visit: Payer: Self-pay | Admitting: Internal Medicine

## 2015-07-17 DIAGNOSIS — Z471 Aftercare following joint replacement surgery: Secondary | ICD-10-CM | POA: Diagnosis not present

## 2015-07-17 DIAGNOSIS — M1711 Unilateral primary osteoarthritis, right knee: Secondary | ICD-10-CM | POA: Diagnosis not present

## 2015-07-17 DIAGNOSIS — Z96652 Presence of left artificial knee joint: Secondary | ICD-10-CM | POA: Diagnosis not present

## 2015-07-20 NOTE — Progress Notes (Signed)
HPI  The patient was in the emergency room in early December of 2013 with chest pain. She had no objective evidence of ischemia. She did have a cardiac CT. This demonstrated a calcium score of 206 with foci in RCA and LAD 67th percentile for age and sex matched controls.  Coronary Arteries: Right dominant with no anomalies LM-Normal LAD- Less than 30% calcific plaque in the proximal and mid vessel. Distal vessel normal D1: large vessel with less than 30% calcific plaque in mid vessel Circumflex- normal OM1- large vessel normal AV groove- normal RCA- large dominate vessel. Less than 30% calcific plaque in the proximal mid and distal vessel.   I have not seen her since 2015. She presents preoperatively prior to having a knee replacement. Since I last saw her she has had no new cardiovascular complaints. She is able to do her household chores which includes vacuuming. With this she denies any cardiovascular symptoms. The patient denies any new symptoms such as chest discomfort, neck or arm discomfort. There has been no new shortness of breath, PND or orthopnea. There have been no reported palpitations, presyncope or syncope.   Allergies  Allergen Reactions  . Lactose Intolerance (Gi) Other (See Comments)    Gi upset  . Meloxicam     REACTION: acute renal failure  with hypovolemic trigger Hospital  3 2011  . Sulfamethoxazole Hives    Current Outpatient Prescriptions  Medication Sig Dispense Refill  . amLODipine (NORVASC) 5 MG tablet Take 2.5 mg by mouth at bedtime.      . bifidobacterium infantis (ALIGN) capsule Take 1 capsule by mouth daily.  14 capsule  0  . cyanocobalamin (,VITAMIN B-12,) 1000 MCG/ML injection Inject 1,000 mcg into the muscle every 30 (thirty) days.      . metoprolol (LOPRESSOR) 50 MG tablet Take 25 mg by mouth 2 (two) times daily.      Marland Kitchen omeprazole (PRILOSEC) 20 MG capsule Take 20 mg by mouth 2 (two) times daily.      . rosuvastatin (CRESTOR) 40 MG tablet Take 40 mg by  mouth at bedtime.      . Vitamin D, Ergocalciferol, (DRISDOL) 50000 UNITS CAPS Take 50,000 Units by mouth every 7 (seven) days. Takes on Friday      . zolpidem (AMBIEN) 10 MG tablet Take 5 mg by mouth at bedtime.         Past Medical History  Diagnosis Date  . Anxiety   . GERD (gastroesophageal reflux disease)   . Hyperlipidemia   . Hypertension   . Arthritis   . Chronic kidney disease     stones  . Osteopenia   . Hiatal hernia   . Diverticulosis of colon (without mention of hemorrhage)   . Allergy   . Cancer (HCC)     Basal cell  . Cataract   . Depression   . ACUTE KIDNEY FAILURE UNSPECIFIED 07/12/2009  . CAD (coronary artery disease)     Mild nonobstructive by CT  . Fibromyalgia     Past Surgical History  Procedure Laterality Date  . Cholecystectomy    . Total knee arthroplasty      left  . Cystectomy  2001    benign  . Bladder tumor excision  2001  . Tendon repair  11/2005    right shoulder  . Lumbar disc surgery      x 2 elsner  . Cataract extraction      bilateral  . Esophageal manometry N/A 07/13/2012  Procedure: ESOPHAGEAL MANOMETRY (EM);  Surgeon: Sable Feil, MD;  Location: [facility];  Service: Endoscopy;  Laterality: N/A;  . Reverse shoulder arthroplasty Right 08/12/2014    Procedure: RIGHT REVERSE TOTAL SHOULDER ARTHROPLASTY;  Surgeon: Netta Cedars, MD;  Location: [facility];  Service: Orthopedics;  Laterality: Right;    ROS:  Positive for back pain reflux.. Otherwise as stated in the HPI and negative for all other systems.  PHYSICAL EXAM BP 114/70 mmHg  Pulse 71  Ht 5\' 1"  (1.549 m)  Wt 168 lb (76.204 kg)  BMI 31.76 kg/m2 GENERAL:  Well appearing HEENT:  Pupils equal round and reactive, fundi not visualized, oral mucosa unremarkable NECK:  No jugular venous distention, waveform within normal limits, carotid upstroke brisk and symmetric, soft bruits, no thyromegaly LYMPHATICS:  No cervical, inguinal adenopathy LUNGS:  Clear to auscultation  bilaterally BACK:  No CVA tenderness CHEST:  Unremarkable HEART:  PMI not displaced or sustained,S1 and S2 within normal limits, no S3, no S4, no clicks, no rubs, no murmurs ABD:  Flat, positive bowel sounds normal in frequency in pitch, no bruits, no rebound, no guarding, no midline pulsatile mass, no hepatomegaly, no splenomegaly EXT:  2 plus pulses throughout, no edema, no cyanosis no clubbing   EKG: Sinus rhythm, rate 71, axis within normal limits, intervals within normal limits, no acute ST-T wave changes. Poor anterior R wave progression.   07/21/2015   ASSESSMENT AND PLAN  CAD - She does have some mild nonobstructive coronary disease. She has no high-risk symptoms or findings. The procedure she's having should not be associated with acute blood loss significantly and is not high risk according to guidelines. She has a reasonable functional level. Therefore, based on ACC/AHA guidelines, the patient would be at acceptable risk for the planned procedure without further cardiovascular testing.    Of note I did suggest to the patient that she should be on aspirin 81 mg daily. This was stopped in the past because of renal failure that she had acutely which probably was related to nonsteroidals and most likely Mobic.  She will discuss this with Lottie Dawson, MD  Bruit - The patient has a slight carotid bruit. However, she had only minimal plaque in 2014. No further imaging is indicated.   Dyslipidemia - She is on an excellent dose of Crestor. Her lipid profile the arlier this year wasexcellent. She will continue the meds as listed.  HTH - The blood pressure is at target. No change in medications is indicated. We will continue with therapeutic lifestyle changes (TLC).

## 2015-07-21 ENCOUNTER — Ambulatory Visit (INDEPENDENT_AMBULATORY_CARE_PROVIDER_SITE_OTHER): Payer: Medicare Other | Admitting: Cardiology

## 2015-07-21 ENCOUNTER — Encounter: Payer: Self-pay | Admitting: Cardiology

## 2015-07-21 VITALS — BP 114/70 | HR 71 | Ht 61.0 in | Wt 168.0 lb

## 2015-07-21 DIAGNOSIS — I2510 Atherosclerotic heart disease of native coronary artery without angina pectoris: Secondary | ICD-10-CM | POA: Diagnosis not present

## 2015-07-21 NOTE — Patient Instructions (Addendum)
Your physician recommends that you schedule a follow-up appointment in: As Needed  Faxed Clearance form to [facility] for pt upcoming Surgery

## 2015-08-11 ENCOUNTER — Other Ambulatory Visit: Payer: Self-pay | Admitting: Orthopedic Surgery

## 2015-08-18 ENCOUNTER — Ambulatory Visit: Payer: Self-pay | Admitting: Cardiology

## 2015-10-31 ENCOUNTER — Other Ambulatory Visit: Payer: Self-pay | Admitting: Internal Medicine

## 2015-11-01 ENCOUNTER — Telehealth: Payer: Self-pay | Admitting: Internal Medicine

## 2015-11-01 ENCOUNTER — Telehealth: Payer: Self-pay | Admitting: Family Medicine

## 2015-11-01 NOTE — Telephone Encounter (Signed)
[facility] [place] [facility] - Client [facility] Patient Name: [name] [name] DOB: 1940/08/09 Initial Comment Caller states she has vomited and been afraid to eat much. Some things give her indigestion. She is also having back pain. Nurse Assessment Nurse: Martyn Ehrich RN, Felicia Date/Time (Eastern Time): 11/01/2015 3:28:02 PM Confirm and document reason for call. If symptomatic, describe symptoms. You must click the next button to save text entered. ---PT was called for a well check reminder. PT said that she had surgery planned on 16 d from now (on 28th) to replace R knee. 6 days ago she started to vomit -. 3x and had a little cold and mild sore throat since. No vomiting since. Not wanting to eat much without nausea (In 2010 she had sharp abdominal pains and she came in and MD said she had acute kidney failure and lost 90% of her kidney and she was hospitalized 2010) She is not feeling bad as that now but she is having back pain and has scoliosis. But the pain is in her flank. She is suspicious something may need to be checked. She is not sure what to do? She estimates she has mild fever onset this week - 2 other times this week as well. No abdominal pain Has the patient traveled out of the country within the last 30 days? ---No Does the patient have any new or worsening symptoms? ---Yes Will a triage be completed? ---Yes Related visit to physician within the last 2 weeks? ---No Does the PT have any chronic conditions? (i.e. diabetes, asthma, etc.) ---Yes List chronic conditions. ---see above Is this a behavioral health or substance abuse call? ---No Guidelines Guideline Title Affirmed Question Affirmed Notes Flank Pain Fever > 100.5 F (38.1 C) Final Disposition User See Physician within 4 Hours (or PCP triage) Martyn Ehrich, RN, [place] Comments she is walking around fine but is upset bc a few d ago she almost fell and caught herself and  didnt fall and she feels she has some pain related to that Referrals [facility] UNDECIDED Disagree/Comply: Comply Call Id: [id number]

## 2015-11-01 NOTE — Telephone Encounter (Signed)
Pt will call back to reschedule CPE when she gets out of rehab.

## 2015-11-01 NOTE — Telephone Encounter (Signed)
Sent to the pharmacy by e-scribe for 90 days.  Message sent to scheduling.  Pt due in Aug for wellness.

## 2015-11-01 NOTE — Telephone Encounter (Signed)
lmom for pt to call back

## 2015-11-01 NOTE — Telephone Encounter (Signed)
Pt is due in August for medicare wellness.  Please schedule the pt.  Have her come fasting for lab work.  Thanks!

## 2015-11-02 ENCOUNTER — Ambulatory Visit (INDEPENDENT_AMBULATORY_CARE_PROVIDER_SITE_OTHER): Payer: Medicare Other | Admitting: Adult Health

## 2015-11-02 ENCOUNTER — Encounter: Payer: Self-pay | Admitting: Adult Health

## 2015-11-02 DIAGNOSIS — N12 Tubulo-interstitial nephritis, not specified as acute or chronic: Secondary | ICD-10-CM

## 2015-11-02 LAB — POCT URINALYSIS DIPSTICK
BILIRUBIN UA: NEGATIVE
GLUCOSE UA: NEGATIVE
Ketones, UA: NEGATIVE
NITRITE UA: NEGATIVE
Spec Grav, UA: >=1.03
UROBILINOGEN UA: 0.2
pH, UA: 5.5

## 2015-11-02 MED ORDER — ONDANSETRON HCL 4 MG PO TABS: 4.0000 mg | ORAL_TABLET | Freq: Three times a day (TID) | ORAL | Status: DC | PRN

## 2015-11-02 MED ORDER — CIPROFLOXACIN HCL 500 MG PO TABS: 500.0000 mg | ORAL_TABLET | Freq: Two times a day (BID) | ORAL | Status: DC

## 2015-11-02 NOTE — Progress Notes (Signed)
Subjective:    Patient ID: [name] [name], female    DOB: 1940-09-30, 75 y.o.   MRN: [id number]  HPI 75 year old female who presents to the office today for an acute issue of " not feeling well for about a week." Her symptoms include loss of appetite, chills, nausea, vomiting, generalized weakness, abdominal pressure, and lower back pain that radiates to her flank.   She denies any other UTI type symptoms such as frequency, urgency or dysuria  She is not eating or drinking as she normally would  Review of Systems  Constitutional: Positive for activity change, appetite change and fatigue.  Gastrointestinal: Positive for nausea, vomiting and abdominal pain. Negative for diarrhea, constipation and rectal pain.  Genitourinary: Positive for flank pain. Negative for dysuria, urgency, frequency and hematuria.  Neurological: Negative.    Past Medical History  Diagnosis Date  . Anxiety   . GERD (gastroesophageal reflux disease)   . Hyperlipidemia   . Hypertension   . Arthritis   . Chronic kidney disease     stones  . Osteopenia   . Hiatal hernia   . Diverticulosis of colon (without mention of hemorrhage)   . Allergy   . Cancer (HCC)     Basal cell  . Cataract   . Depression   . ACUTE KIDNEY FAILURE UNSPECIFIED 07/12/2009  . CAD (coronary artery disease)     Mild nonobstructive by CT  . Fibromyalgia     Social History   Social History  . Marital Status: Widowed    Spouse Name: N/A  . Number of Children: 2  . Years of Education: N/A   Occupational History  . Retired     Social History Main Topics  . Smoking status: Never Smoker   . Smokeless tobacco: Never Used  . Alcohol Use: 0.0 oz/week    0 Standard drinks or equivalent per week     Comment: rare  . Drug Use: No  . Sexual Activity: Not on file   Other Topics Concern  . 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 Cholecystectomy    . Total knee arthroplasty      left  . Cystectomy  2001    benign  . Bladder tumor excision  2001  . Tendon repair  11/2005    right shoulder  . Lumbar disc surgery      x 2 elsner  . Cataract extraction      bilateral  . Esophageal manometry N/A 07/13/2012    Procedure: ESOPHAGEAL MANOMETRY (EM);  Surgeon: Sable Feil, MD;  Location: [facility];  Service: Endoscopy;  Laterality: N/A;  . Reverse shoulder arthroplasty Right 08/12/2014    Procedure: RIGHT REVERSE TOTAL SHOULDER ARTHROPLASTY;  Surgeon: Netta Cedars, MD;  Location: [facility];  Service: Orthopedics;  Laterality: Right;    Family History  Problem Relation Age of Onset  . Lung cancer Father     tobacco  . Kidney cancer Son   . Hypertension Brother     mom's side  . Alzheimer's disease Mother   . Osteoporosis Sister   . Breast cancer Cousin   . Colon cancer Neg Hx   . Heart disease Maternal Uncle     x 4  . Heart disease Maternal Aunt     x 4  . Diabetes  Paternal Grandmother   . Diabetes Paternal Uncle     x 2    Allergies  Allergen Reactions  . Lactose Intolerance (Gi) Other (See Comments)    Gi upset  . Meloxicam     REACTION: acute renal failure  with hypovolemic trigger Hospital  3 2011  . Sulfamethoxazole Hives    Current Outpatient Prescriptions on File Prior to Visit  Medication Sig Dispense Refill  . albuterol (PROVENTIL HFA;VENTOLIN HFA) 108 (90 BASE) MCG/ACT inhaler Inhale 2 puffs into the lungs every 6 (six) hours as needed for wheezing or shortness of breath. 1 Inhaler 2  . amLODipine (NORVASC) 5 MG tablet TAKE 1 TABLET BY MOUTH EVERY DAY 90 tablet 0  . CRESTOR 40 MG tablet TAKE 1 TABLET BY MOUTH EVERY DAY 30 tablet 2  . Cyanocobalamin (B-12 PO) Take 1 tablet by mouth daily. Reported on 05/29/2015    . DULoxetine (CYMBALTA) 60 MG capsule TAKE ONE CAPSULE BY MOUTH TWICE A DAY 60 capsule 2  . metoprolol (LOPRESSOR) 50 MG tablet TAKE 0.5  TABLETS (25 MG TOTAL) BY MOUTH 2 (TWO) TIMES DAILY. 90 tablet 1  . omeprazole (PRILOSEC) 20 MG capsule TAKE ONE CAPSULE BY MOUTH TWICE A DAY 180 capsule 2  . Probiotic Product (ALIGN PO) Take 1 tablet by mouth daily.    . traMADol (ULTRAM) 50 MG tablet Take 50 mg by mouth. Taking one at night.     No current facility-administered medications on file prior to visit.    BP 142/72 mmHg  Temp(Src) 98.2 F (36.8 C) (Oral)  Wt 164 lb 11.2 oz (74.707 kg)       Objective:   Physical Exam  Constitutional: She is oriented to person, place, and time. She appears well-developed and well-nourished. No distress.  Cardiovascular: Normal rate, regular rhythm, normal heart sounds and intact distal pulses.  Exam reveals no gallop and no friction rub.   No murmur heard. Pulmonary/Chest: Effort normal and breath sounds normal. No respiratory distress. She has no wheezes. She has no rales. She exhibits no tenderness.  Abdominal: Soft. Normal appearance and bowel sounds are normal. She exhibits no distension and no mass. There is no tenderness. There is CVA tenderness. There is no rebound and no guarding.  Neurological: She is alert and oriented to person, place, and time.  Skin: Skin is warm and dry. No rash noted. She is not diaphoretic. No erythema. No pallor.  Psychiatric: She has a normal mood and affect. Her behavior is normal. Judgment and thought content normal.  Nursing note and vitals reviewed.     Assessment & Plan:  1. Pyelonephritis - POCT Urinalysis Dipstick + Leuks and blood - Urine culture - BMP with eGFR - ciprofloxacin (CIPRO) 500 MG tablet; Take 1 tablet (500 mg total) by mouth 2 (two) times daily.  Dispense: 10 tablet; Refill: 0 - ondansetron (ZOFRAN) 4 MG tablet; Take 1 tablet (4 mg total) by mouth every 8 (eight) hours as needed for nausea or vomiting.  Dispense: 20 tablet; Refill: 0 - Increase PO intake - Follow up with PCP if no improvement  Dorothyann Peng, NP

## 2015-11-02 NOTE — Patient Instructions (Addendum)
It was great seeing you today  You have a urinary tract infection. I have sent in a prescription for Cipro ( antibiotic) and some medicine to help your nausea.   I will follow up with you regarding your labs   Drink plenty of fluids and start eating

## 2015-11-03 LAB — BASIC METABOLIC PANEL WITH GFR
BUN: 15 mg/dL (ref 7–25)
CHLORIDE: 108 mmol/L (ref 98–110)
CO2: 24 mmol/L (ref 20–31)
CREATININE: 0.92 mg/dL (ref 0.60–0.93)
Calcium: 9.5 mg/dL (ref 8.6–10.4)
GFR, EST AFRICAN AMERICAN: 71 mL/min (ref >=60)
GFR, Est Non African American: 62 mL/min (ref >=60)
GLUCOSE: 95 mg/dL (ref 65–99)
POTASSIUM: 3.9 mmol/L (ref 3.5–5.3)
Sodium: 143 mmol/L (ref 135–146)

## 2015-11-04 LAB — URINE CULTURE
COLONY COUNT: NO GROWTH
Organism ID, Bacteria: NO GROWTH

## 2015-11-05 IMAGING — CR DG SHOULDER 2+V PORT*R*
2 series · 2 of 2 positions shown · non-contrast
Comparison: None.

CLINICAL DATA: Post right shoulder replacement

EXAM:
PORTABLE RIGHT SHOULDER - 2+ VIEW

[AP (1 of 2)]
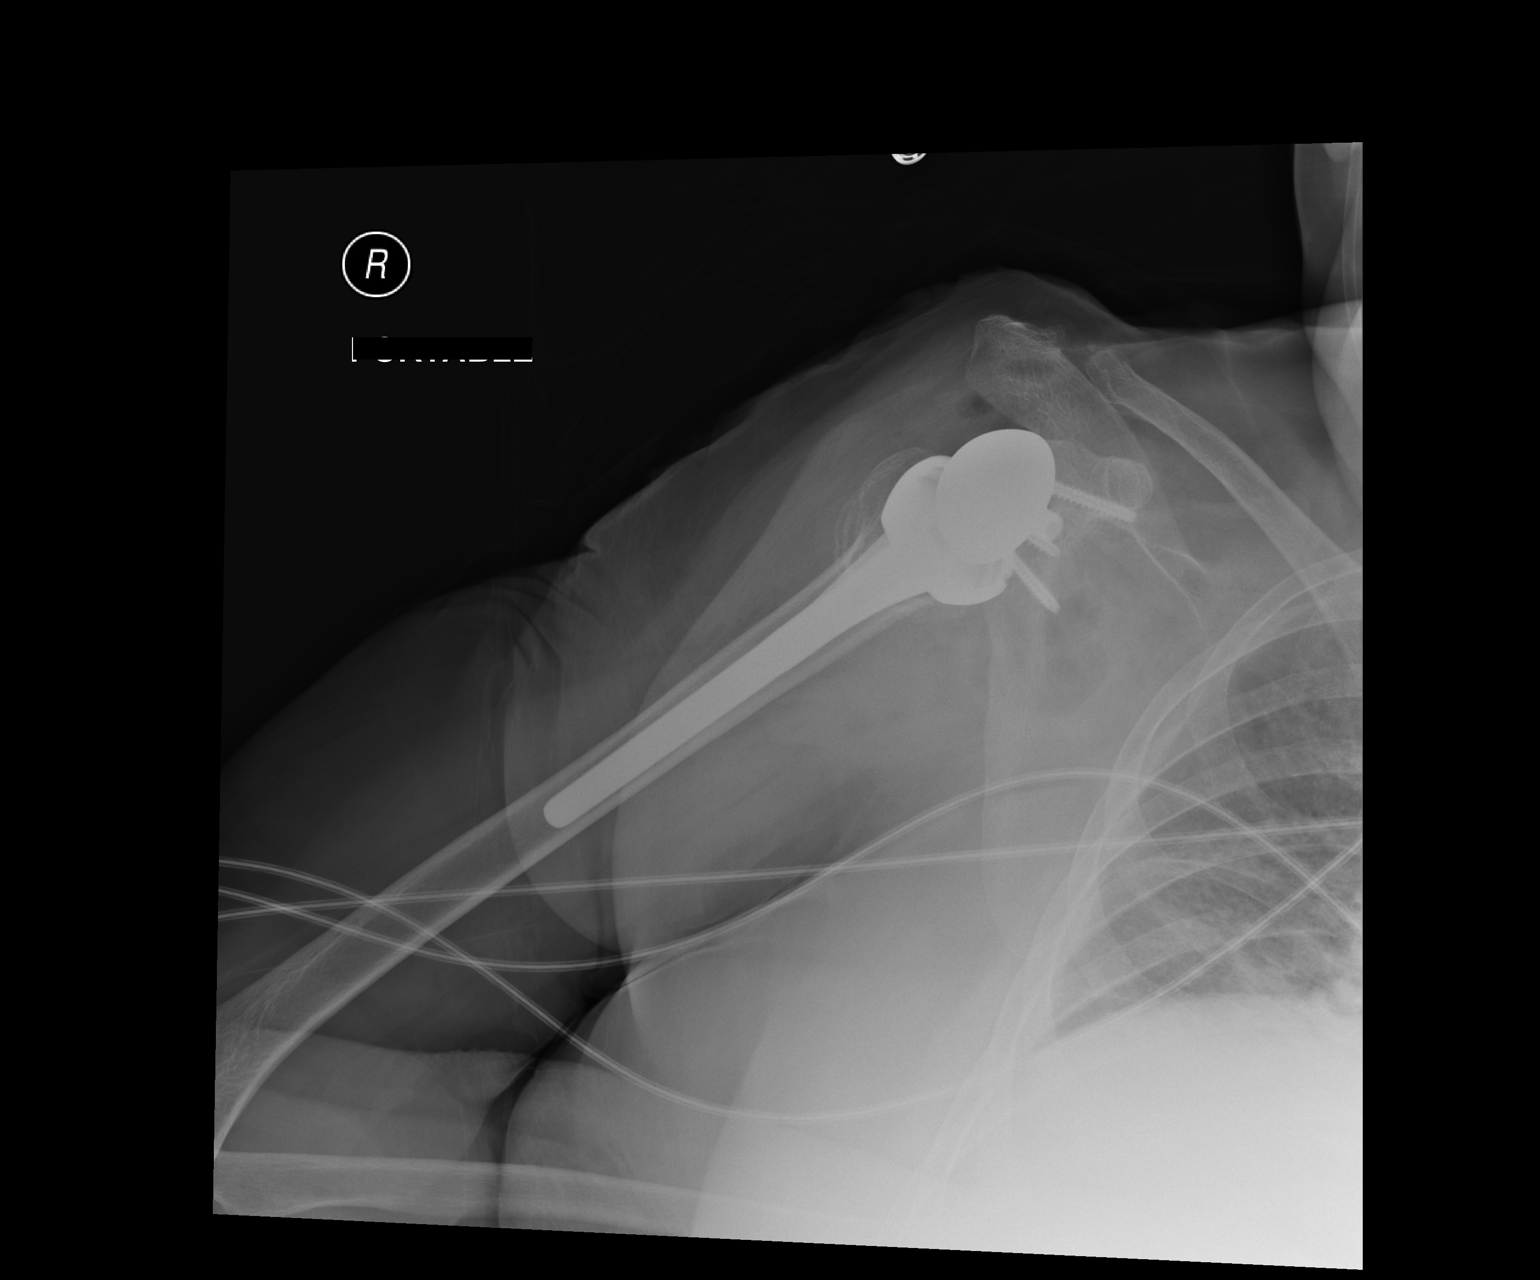

[AP (2 of 2)]
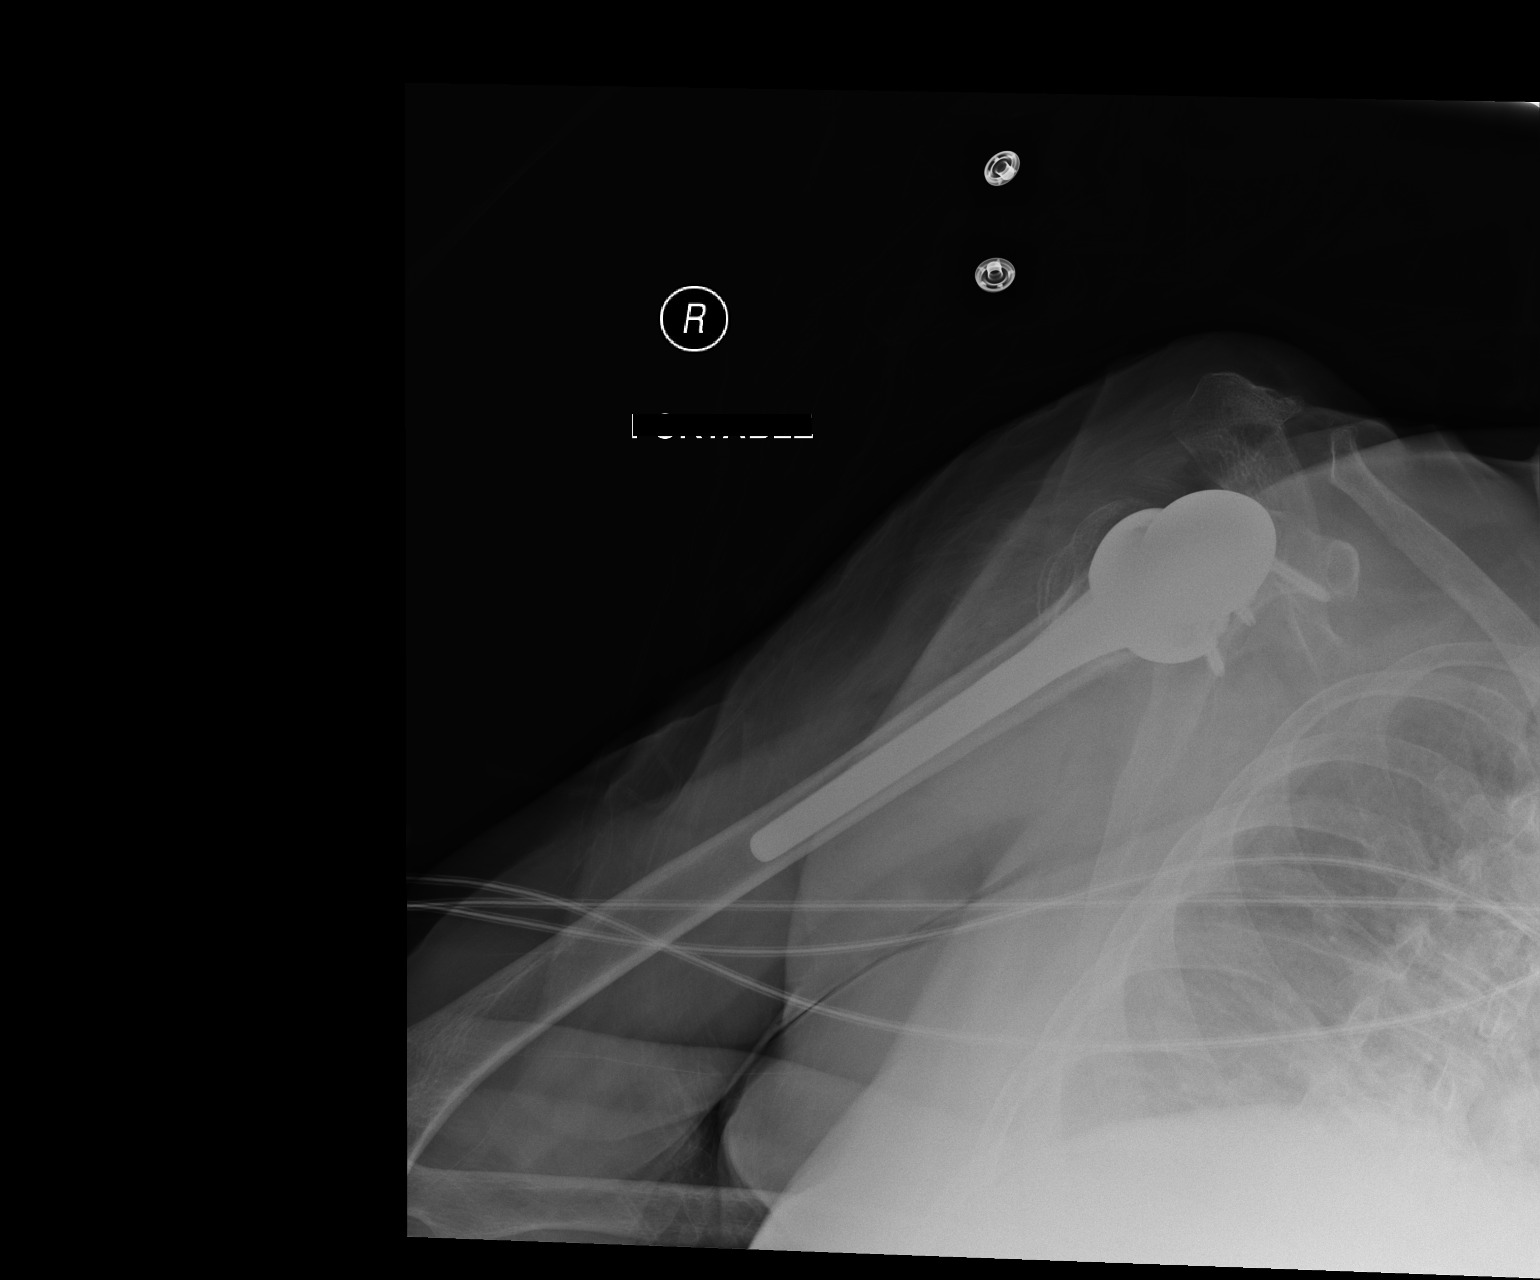

[2 of 2 positions shown; findings below may reference images not displayed]

FINDINGS: Two views of the right shoulder submitted. There is right shoulder
prosthesis in anatomic alignment. Small amount of periarticular soft
tissue air.
IMPRESSION: Right shoulder prosthesis in anatomic alignment. Postsurgical
changes with small amount of periarticular soft tissue air.

## 2015-11-07 NOTE — Progress Notes (Signed)
After sched pre-op appt at [facility] pt stated she just took last antibiotic for UTI this A.M. States that she spoke with Margarita Grizzle at [facility] ortho. With further questioning this pt is not sure if UTI is really gone or just improved. States does not have follow-up with pcp. Patient has decided to reschedule as Margarita Grizzle need a definite answer if she was still going to do the 7.28 surgery. Patient stated that she will call Margarita Grizzle and reschedule surgery.  To give herself more time to be sure that symptoms are gone.

## 2015-11-08 ENCOUNTER — Telehealth: Payer: Self-pay | Admitting: Internal Medicine

## 2015-11-08 NOTE — H&P (Signed)
TOTAL KNEE ADMISSION H&P  Patient is being admitted for right total knee arthroplasty.  Subjective:  Chief Complaint:right knee pain.  HPI: [name] [name], 75 y.o. female, has a history of pain and functional disability in the right knee due to arthritis and has failed non-surgical conservative treatments for greater than 12 weeks to includeNSAID's and/or analgesics, flexibility and strengthening excercises, use of assistive devices, weight reduction as appropriate and activity modification.  Onset of symptoms was gradual, starting 7 years ago with gradually worsening course since that time. The patient noted no past surgery on the right knee(s).  Patient currently rates pain in the right knee(s) at 8 out of 10 with activity. Patient has night pain, worsening of pain with activity and weight bearing, pain that interferes with activities of daily living, pain with passive range of motion and joint swelling.  Patient has evidence of subchondral cysts, subchondral sclerosis and joint space narrowing by imaging studies. There is no active infection.  Patient Active Problem List   Diagnosis Date Noted  . S/P shoulder replacement 08/12/2014  . Primary osteoarthritis involving multiple joints 03/21/2014  . CKD (chronic kidney disease) 12/16/2013  . Medication management 09/17/2013  . Visit for preventive health examination 06/16/2013  . Hyperglycemia 06/16/2013  . Pre-operative clearance 08/17/2012  . Agatston coronary artery calcium score between 200 and 399 04/13/2012  . Breast lump on left side at 9 o'clock position 02/12/2012  . Skin lesion 02/12/2012  . Dizziness - light-headed 11/14/2011  . Vitamin B 12 deficiency 11/14/2011  . Gynecomastia 07/18/2011  . Throat soreness 07/18/2011  . Fatigue 06/01/2011  . Chest tightness or pressure 06/01/2011  . Sleep difficulties 03/24/2011  . Intentional underdosing of medication regimen by patient due to financial hardship 03/24/2011  . ABDOMINAL  BLOATING 10/10/2009  . LUMP OR MASS IN BREAST 08/28/2009  . ANEMIA 07/26/2009  . ABDOMINAL PAIN OTHER SPECIFIED SITE 07/12/2009  . 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/2009  . RENAL CALCULUS, HX OF 01/04/2009  . IRON DEFICIENCY 11/15/2008  . FIBROMYALGIA 11/15/2008  . VITAMIN D DEFICIENCY 10/10/2008  . ANEMIA-B12 DEFICIENCY 05/10/2008  . SOMATIZATION DISORDER 05/05/2008  . ADJ DISORDER WITH MIXED ANXIETY & DEPRESSED MOOD 05/05/2008  . DYSPHAGIA UNSPECIFIED 05/02/2008  . Personal history of other diseases of digestive system 05/02/2008  . VARICOSE VEINS, LOWER EXTREMITIES 04/12/2008  . IBS 04/12/2008  . OSTEOPENIA 04/12/2008  . Adjustment disorder with depressed mood 02/16/2008  . CHEST PAIN 02/16/2008  . Hyperlipidemia 12/24/2006  . ANXIETY 12/24/2006  . Essential hypertension 12/24/2006  . GERD 12/24/2006  . HIATAL HERNIA 12/24/2006  . Osteoarthritis 12/24/2006   Past Medical History  Diagnosis Date  . Anxiety   . GERD (gastroesophageal reflux disease)   . Hyperlipidemia   . Hypertension   . Arthritis   . Chronic kidney disease     stones  . Osteopenia   . Hiatal hernia   . Diverticulosis of colon (without mention of hemorrhage)   . Allergy   . Cancer (HCC)     Basal cell  . Cataract   . Depression   . ACUTE KIDNEY FAILURE UNSPECIFIED 07/12/2009  . CAD (coronary artery disease)     Mild nonobstructive by CT  . Fibromyalgia     Past Surgical History  Procedure Laterality Date  . Cholecystectomy    . Total knee arthroplasty      left  . Cystectomy  2001    benign  . Bladder tumor excision  2001  . Tendon repair  11/2005  right shoulder  . Lumbar disc surgery      x 2 elsner  . Cataract extraction      bilateral  . Esophageal manometry N/A 07/13/2012    Procedure: ESOPHAGEAL MANOMETRY (EM);  Surgeon: Sable Feil, MD;  Location: [facility];  Service: Endoscopy;  Laterality: N/A;  . Reverse shoulder arthroplasty Right 08/12/2014    Procedure: RIGHT REVERSE TOTAL  SHOULDER ARTHROPLASTY;  Surgeon: Netta Cedars, MD;  Location: [facility];  Service: Orthopedics;  Laterality: Right;    No prescriptions prior to admission   Allergies  Allergen Reactions  . Lactose Intolerance (Gi) Other (See Comments)    Gi upset  . Meloxicam     REACTION: acute renal failure  with hypovolemic trigger Hospital  3 2011  . Sulfamethoxazole Hives    Social History  Substance Use Topics  . Smoking status: Never Smoker   . Smokeless tobacco: Never Used  . Alcohol Use: 0.0 oz/week    0 Standard drinks or equivalent per week     Comment: rare    Family History  Problem Relation Age of Onset  . Lung cancer Father     tobacco  . Kidney cancer Son   . Hypertension Brother     mom's side  . Alzheimer's disease Mother   . Osteoporosis Sister   . Breast cancer Cousin   . Colon cancer Neg Hx   . Heart disease Maternal Uncle     x 4  . Heart disease Maternal Aunt     x 4  . Diabetes Paternal Grandmother   . Diabetes Paternal Uncle     x 2     Review of Systems  Constitutional: Negative.   HENT: Negative.   Eyes: Negative.   Respiratory: Negative.   Cardiovascular: Negative.   Gastrointestinal: Positive for heartburn.  Genitourinary: Negative.   Musculoskeletal: Positive for joint pain.  Skin: Negative.   Neurological: Negative.   Endo/Heme/Allergies: Negative.     Objective:  Physical Exam  Constitutional: She is oriented to person, place, and time. She appears well-developed.  HENT:  Head: Normocephalic.  Eyes: EOM are normal.  Neck: Normal range of motion.  Cardiovascular: Normal rate, normal heart sounds and intact distal pulses.   Respiratory: Effort normal and breath sounds normal.  GI: Soft.  Genitourinary:  Deferred  Musculoskeletal:  Rigth knee pain with rom. RLE grossly n/v intact. Knee is stable.  Neurological: She is alert and oriented to person, place, and time. She has normal reflexes.  Skin: Skin is warm and dry.  Psychiatric: Her  behavior is normal.    Vital signs in last 24 hours:    Labs:   Estimated body mass index is 31.76 kg/(m^2) as calculated from the following:   Height as of 07/21/15: 5\' 1"  (1.549 m).   Weight as of 07/21/15: 76.204 kg (168 lb).   Imaging Review Plain radiographs demonstrate severe degenerative joint disease of the right knee(s). The overall alignment ismild varus. The bone quality appears to be good for age and reported activity level.  Assessment/Plan:  End stage arthritis, right knee   The patient history, physical examination, clinical judgment of the provider and imaging studies are consistent with end stage degenerative joint disease of the right knee(s) and total knee arthroplasty is deemed medically necessary. The treatment options including medical management, injection therapy arthroscopy and arthroplasty were discussed at length. The risks and benefits of total knee arthroplasty were presented and reviewed. The risks due to aseptic loosening,  infection, stiffness, patella tracking problems, thromboembolic complications and other imponderables were discussed. The patient acknowledged the explanation, agreed to proceed with the plan and consent was signed. Patient is being admitted for inpatient treatment for surgery, pain control, PT, OT, prophylactic antibiotics, VTE prophylaxis, progressive ambulation and ADL's and discharge planning. The patient is planning to be discharged to skilled nursing facility.  Patient is not a candidate for IV tranexamic acid. Reports hx of blood clots years ago.

## 2015-11-09 NOTE — Patient Instructions (Addendum)
[name] [name]  11/09/2015   Your procedure is scheduled on: Friday 11/17/2015  Report to [facility] Main  Entrance take [facility]  elevators to 3rd floor to  [place] at  1100  AM.  Call this number if you have problems the morning of surgery [phone]   Remember: ONLY 1 PERSON MAY GO WITH YOU TO SHORT STAY TO GET  READY MORNING OF [place].   Do not eat food  :After Midnight.  MAY HAVE CLEAR LIQUIDS FROM MIDNIGHT UP UNTIL 0700 AM THEN NOTHING UNTIL AFTER SURGERY! SEE LIST BELOW!     Take these medicines the morning of surgery with A SIP OF WATER:AMLODIPINE, CYMBALTA, [place] may not have any metal on your body including hair pins and              piercings  Do not wear jewelry, make-up, lotions, powders or perfumes, deodorant             Do not wear nail polish.  Do not shave  48 hours prior to surgery.              Men may shave face and neck.   Do not bring valuables to the hospital. [place].  Contacts, dentures or bridgework may not be worn into surgery.  Leave suitcase in the car. After surgery it may be brought to your room.                  Please read over the following fact sheets you were given: _____________________________________________________________________             [facility] - Preparing for Surgery Before surgery, you can play an important role.  Because skin is not sterile, your skin needs to be as free of germs as possible.  You can reduce the number of germs on your skin by washing with CHG (chlorahexidine gluconate) soap before surgery.  CHG is an antiseptic cleaner which kills germs and bonds with the skin to continue killing germs even after washing. Please DO NOT use if you have an allergy to CHG or antibacterial soaps.  If your skin becomes reddened/irritated stop using the CHG and inform your nurse when you arrive  at Short Stay. Do not shave (including legs and underarms) for at least 48 hours prior to the first CHG shower.  You may shave your face/neck. Please follow these instructions carefully:  1.  Shower with CHG Soap the night before surgery and the  morning of Surgery.  2.  If you choose to wash your hair, wash your hair first as usual with your  normal  shampoo.  3.  After you shampoo, rinse your hair and body thoroughly to remove the  shampoo.                           4.  Use CHG as you would any other liquid soap.  You can apply chg directly  to the skin and wash  Gently with a scrungie or clean washcloth.  5.  Apply the CHG Soap to your body ONLY FROM THE NECK DOWN.   Do not use on face/ open                           Wound or open sores. Avoid contact with eyes, ears mouth and genitals (private parts).                       Wash face,  Genitals (private parts) with your normal soap.             6.  Wash thoroughly, paying special attention to the area where your surgery  will be performed.  7.  Thoroughly rinse your body with warm water from the neck down.  8.  DO NOT shower/wash with your normal soap after using and rinsing off  the CHG Soap.                9.  Pat yourself dry with a clean towel.            10.  Wear clean pajamas.            11.  Place clean sheets on your bed the night of your first shower and do not  sleep with pets. Day of Surgery : Do not apply any lotions/deodorants the morning of surgery.  Please wear clean clothes to the hospital/surgery center.  FAILURE TO FOLLOW THESE INSTRUCTIONS MAY RESULT IN THE CANCELLATION OF YOUR SURGERY PATIENT SIGNATURE_________________________________  NURSE SIGNATURE__________________________________  ________________________________________________________________________   Adam Phenix  An incentive spirometer is a tool that can help keep your lungs clear and active. This tool measures how well you  are filling your lungs with each breath. Taking long deep breaths may help reverse or decrease the chance of developing breathing (pulmonary) problems (especially infection) following:  A long period of time when you are unable to move or be active. BEFORE THE PROCEDURE   If the spirometer includes an indicator to show your best effort, your nurse or respiratory therapist will set it to a desired goal.  If possible, sit up straight or lean slightly forward. Try not to slouch.  Hold the incentive spirometer in an upright position. INSTRUCTIONS FOR USE   Sit on the edge of your bed if possible, or sit up as far as you can in bed or on a chair.  Hold the incentive spirometer in an upright position.  Breathe out normally.  Place the mouthpiece in your mouth and seal your lips tightly around it.  Breathe in slowly and as deeply as possible, raising the piston or the ball toward the top of the column.  Hold your breath for 3-5 seconds or for as long as possible. Allow the piston or ball to fall to the bottom of the column.  Remove the mouthpiece from your mouth and breathe out normally.  Rest for a few seconds and repeat Steps 1 through 7 at least 10 times every 1-2 hours when you are awake. Take your time and take a few normal breaths between deep breaths.  The spirometer may include an indicator to show your best effort. Use the indicator as a goal to work toward during each repetition.  After each set of 10 deep breaths, practice coughing to be sure your lungs are clear. If you have an incision (the cut made at the time of surgery),  support your incision when coughing by placing a pillow or rolled up towels firmly against it. Once you are able to get out of bed, walk around indoors and cough well. You may stop using the incentive spirometer when instructed by your caregiver.  RISKS AND COMPLICATIONS  Take your time so you do not get dizzy or light-headed.  If you are in pain, you may  need to take or ask for pain medication before doing incentive spirometry. It is harder to take a deep breath if you are having pain. AFTER USE  Rest and breathe slowly and easily.  It can be helpful to keep track of a log of your progress. Your caregiver can provide you with a simple table to help with this. If you are using the spirometer at home, follow these instructions: [place] IF:   You are having difficultly using the spirometer.  You have trouble using the spirometer as often as instructed.  Your pain medication is not giving enough relief while using the spirometer.  You develop fever of 100.5 F (38.1 C) or higher. SEEK IMMEDIATE MEDICAL CARE IF:   You cough up bloody sputum that had not been present before.  You develop fever of 102 F (38.9 C) or greater.  You develop worsening pain at or near the incision site. MAKE SURE YOU:   Understand these instructions.  Will watch your condition.  Will get help right away if you are not doing well or get worse. Document Released: 08/19/2006 Document Revised: 07/01/2011 Document Reviewed: 10/20/2006 ExitCare Patient Information 2014 ExitCare, [place].   ________________________________________________________________________  WHAT IS A BLOOD TRANSFUSION? Blood Transfusion Information  A transfusion is the replacement of blood or some of its parts. Blood is made up of multiple cells which provide different functions.  Red blood cells carry oxygen and are used for blood loss replacement.  White blood cells fight against infection.  Platelets control bleeding.  Plasma helps clot blood.  Other blood products are available for specialized needs, such as hemophilia or other clotting disorders. BEFORE THE TRANSFUSION  Who gives blood for transfusions?   Healthy volunteers who are fully evaluated to make sure their blood is safe. This is blood bank blood. Transfusion therapy is the safest it has ever been in  the practice of medicine. Before blood is taken from a donor, a complete history is taken to make sure that person has no history of diseases nor engages in risky social behavior (examples are intravenous drug use or sexual activity with multiple partners). The donor's travel history is screened to minimize risk of transmitting infections, such as malaria. The donated blood is tested for signs of infectious diseases, such as HIV and hepatitis. The blood is then tested to be sure it is compatible with you in order to minimize the chance of a transfusion reaction. If you or a relative donates blood, this is often done in anticipation of surgery and is not appropriate for emergency situations. It takes many days to process the donated blood. RISKS AND COMPLICATIONS Although transfusion therapy is very safe and saves many lives, the main dangers of transfusion include:   Getting an infectious disease.  Developing a transfusion reaction. This is an allergic reaction to something in the blood you were given. Every precaution is taken to prevent this. The decision to have a blood transfusion has been considered carefully by your caregiver before blood is given. Blood is not given unless the benefits outweigh the risks. AFTER THE TRANSFUSION  Right after receiving a blood transfusion, you will usually feel much better and more energetic. This is especially true if your red blood cells have gotten low (anemic). The transfusion raises the level of the red blood cells which carry oxygen, and this usually causes an energy increase.  The nurse administering the transfusion will monitor you carefully for complications. HOME CARE INSTRUCTIONS  No special instructions are needed after a transfusion. You may find your energy is better. Speak with your caregiver about any limitations on activity for underlying diseases you may have. SEEK MEDICAL CARE IF:   Your condition is not improving after your  transfusion.  You develop redness or irritation at the intravenous (IV) site. SEEK IMMEDIATE MEDICAL CARE IF:  Any of the following symptoms occur over the next 12 hours:  Shaking chills.  You have a temperature by mouth above 102 F (38.9 C), not controlled by medicine.  Chest, back, or muscle pain.  People around you feel you are not acting correctly or are confused.  Shortness of breath or difficulty breathing.  Dizziness and fainting.  You get a rash or develop hives.  You have a decrease in urine output.  Your urine turns a dark color or changes to pink, red, or brown. Any of the following symptoms occur over the next 10 days:  You have a temperature by mouth above 102 F (38.9 C), not controlled by medicine.  Shortness of breath.  Weakness after normal activity.  The white part of the eye turns yellow (jaundice).  You have a decrease in the amount of urine or are urinating less often.  Your urine turns a dark color or changes to pink, red, or brown. Document Released: 04/05/2000 Document Revised: 07/01/2011 Document Reviewed: 11/23/2007 ExitCare Patient Information 2014 ExitCare, [place].  _______________________________________________________________________   CLEAR LIQUID DIET   Foods Allowed                                                                     Foods Excluded  Coffee and tea, regular and decaf                             liquids that you cannot  Plain Jell-O in any flavor                                             see through such as: Fruit ices (not with fruit pulp)                                     milk, soups, orange juice  Iced Popsicles                                    All solid food Carbonated beverages, regular and diet  Cranberry, grape and apple juices Sports drinks like Gatorade Lightly seasoned clear broth or consume(fat free) Sugar, honey syrup  Sample Menu Breakfast                                 Lunch                                     Supper Cranberry juice                    Beef broth                            Chicken broth Jell-O                                     Grape juice                           Apple juice Coffee or tea                        Jell-O                                      Popsicle                                                Coffee or tea                        Coffee or tea  _____________________________________________________________________

## 2015-11-09 NOTE — Telephone Encounter (Signed)
Error

## 2015-11-10 ENCOUNTER — Encounter (HOSPITAL_COMMUNITY)
Admission: RE | Admit: 2015-11-10 | Discharge: 2015-11-10 | Disposition: A | Discharge status: Home / Self Care | Payer: Medicare Other | Source: Ambulatory Visit | Attending: Specialist | Admitting: Specialist

## 2015-11-10 ENCOUNTER — Encounter (HOSPITAL_COMMUNITY): Payer: Self-pay

## 2015-11-10 DIAGNOSIS — Z01812 Encounter for preprocedural laboratory examination: Secondary | ICD-10-CM | POA: Insufficient documentation

## 2015-11-10 DIAGNOSIS — M1711 Unilateral primary osteoarthritis, right knee: Secondary | ICD-10-CM | POA: Diagnosis not present

## 2015-11-10 HISTORY — DX: Other specified postprocedural states: R11.2

## 2015-11-10 HISTORY — DX: Nausea with vomiting, unspecified: R11.2

## 2015-11-10 HISTORY — DX: Acute kidney failure, unspecified: N17.9

## 2015-11-10 HISTORY — DX: Other specified postprocedural states: Z98.890

## 2015-11-10 LAB — URINALYSIS, ROUTINE W REFLEX MICROSCOPIC
Glucose, UA: NEGATIVE mg/dL
Hgb urine dipstick: NEGATIVE
Ketones, ur: NEGATIVE mg/dL
NITRITE: NEGATIVE
PH: 5.5 (ref 5.0–8.0)
Protein, ur: NEGATIVE mg/dL
SPECIFIC GRAVITY, URINE: 1.03 (ref 1.005–1.030)

## 2015-11-10 LAB — CBC
HCT: 41.4 % (ref 36.0–46.0)
Hemoglobin: 13.2 g/dL (ref 12.0–15.0)
MCH: 29.2 pg (ref 26.0–34.0)
MCHC: 31.9 g/dL (ref 30.0–36.0)
MCV: 91.6 fL (ref 78.0–100.0)
PLATELETS: 180 10*3/uL (ref 150–400)
RBC: 4.52 MIL/uL (ref 3.87–5.11)
RDW: 14.1 % (ref 11.5–15.5)
WBC: 4.4 10*3/uL (ref 4.0–10.5)

## 2015-11-10 LAB — PROTIME-INR
INR: 0.98 (ref 0.00–1.49)
PROTHROMBIN TIME: 13.2 s (ref 11.6–15.2)

## 2015-11-10 LAB — BASIC METABOLIC PANEL
Anion gap: 6 (ref 5–15)
BUN: 19 mg/dL (ref 6–20)
CALCIUM: 9.2 mg/dL (ref 8.9–10.3)
CHLORIDE: 106 mmol/L (ref 101–111)
CO2: 28 mmol/L (ref 22–32)
CREATININE: 0.93 mg/dL (ref 0.44–1.00)
GFR calc non Af Amer: 59 mL/min — ABNORMAL LOW (ref >60)
Glucose, Bld: 100 mg/dL — ABNORMAL HIGH (ref 65–99)
Potassium: 4.5 mmol/L (ref 3.5–5.1)
Sodium: 140 mmol/L (ref 135–145)

## 2015-11-10 LAB — URINE MICROSCOPIC-ADD ON

## 2015-11-10 LAB — APTT: aPTT: 28 seconds (ref 24–37)

## 2015-11-10 LAB — ABO/RH: ABO/RH(D): A POS

## 2015-11-10 LAB — SURGICAL PCR SCREEN
MRSA, PCR: NEGATIVE
STAPHYLOCOCCUS AUREUS: NEGATIVE

## 2015-11-13 ENCOUNTER — Ambulatory Visit (INDEPENDENT_AMBULATORY_CARE_PROVIDER_SITE_OTHER): Payer: Medicare Other | Admitting: Internal Medicine

## 2015-11-13 ENCOUNTER — Encounter: Payer: Self-pay | Admitting: Internal Medicine

## 2015-11-13 VITALS — BP 150/74 | Temp 98.4°F | Ht 61.0 in | Wt 170.2 lb

## 2015-11-13 DIAGNOSIS — I10 Essential (primary) hypertension: Secondary | ICD-10-CM | POA: Diagnosis not present

## 2015-11-13 DIAGNOSIS — N189 Chronic kidney disease, unspecified: Secondary | ICD-10-CM | POA: Diagnosis not present

## 2015-11-13 DIAGNOSIS — N12 Tubulo-interstitial nephritis, not specified as acute or chronic: Secondary | ICD-10-CM | POA: Diagnosis not present

## 2015-11-13 DIAGNOSIS — Z87440 Personal history of urinary (tract) infections: Secondary | ICD-10-CM

## 2015-11-13 DIAGNOSIS — R399 Unspecified symptoms and signs involving the genitourinary system: Secondary | ICD-10-CM | POA: Diagnosis not present

## 2015-11-13 DIAGNOSIS — R350 Frequency of micturition: Secondary | ICD-10-CM | POA: Diagnosis not present

## 2015-11-13 LAB — POC URINALSYSI DIPSTICK (AUTOMATED)
Bilirubin, UA: NEGATIVE
Blood, UA: NEGATIVE
GLUCOSE UA: NEGATIVE
LEUKOCYTES UA: NEGATIVE
NITRITE UA: NEGATIVE
PROTEIN UA: NEGATIVE
SPEC GRAV UA: 1.015
UROBILINOGEN UA: 0.2
pH, UA: 5.5

## 2015-11-13 NOTE — Telephone Encounter (Signed)
[name] [name] came in 11/08/2015 and was wanting to drop off a urine sample to see if she still has an infection and I advised her that she would have to ,ake an appoinment with Tommi Rumps to have a urine sample taken and diagnoised. She didn't want to make an appointment, she just want to drop the sample off and have Dr. Regis Bill to call her in a Rx to her pharmacy. She has to get rid of the infection in order to have her knee replacement surgery on 11/17/15. Dr. Theda Sers office called today stating that the patient had an urine sample taken at the hospital and she still has a bacterial  Infection. The patient stated that she still hasn't received her Rx yet. The orthopedics stated that she will have to reschedule her surgery if the infection isn't gone.

## 2015-11-13 NOTE — Progress Notes (Signed)
Pre visit review using our clinic review tool, if applicable. No additional management support is needed unless otherwise documented below in the visit note.  Chief Complaint  Patient presents with  . Pelvic Pain  . Fatigue  . Urinary Urgency  . Flank Pain  . Back Pain    HPI: [name] [name] 75 y.o.  sda appt  Seen 11 days ago for poss uti  And lab nl pyuria but NG to urine   Given 5 days of cipro Then had pre op  ua  Told poss infection  slo wants to make sure  No uti.    No fever but has lbp poss from scoliosis  No fever chills   Remote hx of IC and benign alder tumor .  Some of sx feel like IC .   midl vag dc no itching finished  antibiotica 3 days ago no new sx  ROS: See pertinent positives and negatives per HPI.  Past Medical History:  Diagnosis Date  . Acute kidney failure ([place])    5 years ago  . ACUTE KIDNEY FAILURE UNSPECIFIED 07/12/2009  . Allergy   . Anxiety   . Arthritis   . CAD (coronary artery disease)    Mild nonobstructive by CT  . 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 Cataract   . Chronic kidney disease    stones  . Depression   . Diverticulosis of colon (without mention of hemorrhage)   . Fibromyalgia   . GERD (gastroesophageal reflux disease)   . Hiatal hernia   . Hyperlipidemia   . Hypertension   . Osteopenia   . PONV (postoperative nausea and vomiting)     Family History  Problem Relation Age of Onset  . Lung cancer Father     tobacco  . Kidney cancer Son   . Hypertension Brother     mom's side  . Alzheimer's disease Mother   . Osteoporosis Sister   . Breast cancer Cousin   . Colon cancer Neg Hx   . Heart disease Maternal Uncle     x 4  . Heart disease Maternal Aunt     x 4  . Diabetes Paternal Grandmother   . Diabetes Paternal Uncle     x 2    Social History   Social History  . Marital status: Widowed    Spouse name: N/A  . Number of children: 2  . Years of education: N/A   Occupational History  . Retired     Social History Main  Topics  . Smoking status: Never Smoker  . Smokeless tobacco: Never Used  . Alcohol use 0.0 oz/week     Comment: rare  . Drug use: No  . Sexual activity: Not Asked   Other Topics Concern  . 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 albuterol (PROVENTIL HFA;VENTOLIN HFA) 108 (90 BASE) MCG/ACT inhaler Inhale 2 puffs into the lungs every 6 (six) hours as needed for wheezing or shortness of breath. 1 Inhaler 2  . amLODipine (NORVASC) 5 MG tablet TAKE 1 TABLET BY MOUTH EVERY DAY (Patient taking differently: TAKE 5 MG BY MOUTH EVERY DAY) 90 tablet 0  . CRESTOR 40 MG tablet TAKE 1 TABLET BY MOUTH EVERY DAY (Patient taking differently: TAKE 40 MG BY MOUTH EVERY DAY) 30 tablet 2  .  Cyanocobalamin (B-12 PO) Take 1 tablet by mouth daily. Reported on 05/29/2015    . DULoxetine (CYMBALTA) 60 MG capsule TAKE ONE CAPSULE BY MOUTH TWICE A DAY (Patient taking differently: TAKE 60 MG BY MOUTH TWICE A DAY) 60 capsule 2  . metoprolol (LOPRESSOR) 50 MG tablet TAKE 0.5 TABLETS (25 MG TOTAL) BY MOUTH 2 (TWO) TIMES DAILY. 90 tablet 1  . omeprazole (PRILOSEC) 20 MG capsule TAKE ONE CAPSULE BY MOUTH TWICE A DAY (Patient taking differently: TAKE 20 MG BY MOUTH TWICE A DAY) 180 capsule 2  . Probiotic Product (ALIGN PO) Take 1 tablet by mouth daily.    . traMADol (ULTRAM) 50 MG tablet Take 50-100 mg by mouth at bedtime as needed for moderate pain or severe pain.     . ciprofloxacin (CIPRO) 500 MG tablet Take 1 tablet (500 mg total) by mouth 2 (two) times daily. (Patient not taking: Reported on 11/10/2015) 10 tablet 0  . ondansetron (ZOFRAN) 4 MG tablet Take 1 tablet (4 mg total) by mouth every 8 (eight) hours as needed for nausea or vomiting. 20 tablet 0   No facility-administered medications prior to visit.      EXAM:  BP (!) 150/74 (BP Location: Right Arm, Patient  Position: Sitting, Cuff Size: Normal)   Temp 98.4 F (36.9 C) (Oral)   Ht 5\' 1"  (1.549 m)   Wt 170 lb 3.2 oz (77.2 kg)   BMI 32.16 kg/m   Body mass index is 32.16 kg/m.  GENERAL: vitals reviewed and listed above, alert, oriented, appears well hydrated and in no acute distress HEENT: atraumatic, conjunctiva  clear, no obvious abnormalities on inspection of external nose and ears  NECK: no obvious masses on inspection palpation  LUNGS: clear to auscultation bilaterally, no wheezes, rales or rhonchi, good air movement CV: HRRR, no clubbing cyanosis 1+  peripheral edema nl cap refill  MS: moves all extremities without noticeable focal  Abnormality DJD limping  But able to get up on table.  PSYCH: pleasant and cooperative, no obvious depression or anxiety Lab Results  Component Value Date   WBC 4.4 11/10/2015   HGB 13.2 11/10/2015   HCT 41.4 11/10/2015   PLT 180 11/10/2015   GLUCOSE 100 (H) 11/10/2015   CHOL 155 10/05/2014   TRIG 78.0 10/05/2014   HDL 71.20 10/05/2014   LDLDIRECT 179.1 10/09/2007   LDLCALC 68 10/05/2014   ALT 13 10/05/2014   AST 16 10/05/2014   NA 140 11/10/2015   K 4.5 11/10/2015   CL 106 11/10/2015   CREATININE 0.93 11/10/2015   BUN 19 11/10/2015   CO2 28 11/10/2015   TSH 1.20 10/05/2014   INR 0.98 11/10/2015   HGBA1C 5.7 10/05/2014   ua  Clear today  ASSESSMENT AND PLAN:  Discussed the following assessment and plan:  Lower urinary tract symptoms (LUTS)  Urinary frequency - hx clinical pyelo NG cx  - Plan: POCT Urinalysis Dipstick (Automated), Urine culture  Essential hypertension  CKD (chronic kidney disease), unspecified stage  Hx: UTI (urinary tract infection)  No evidence of infection at this time. I suspect the previous urinalysis was contaminated with high epithelial cells and not indicative of any infection. It is possible she has some mild urinary symptoms with a past history of IC. At this time I don't see any evidence of a condition  that would keep her from proceeding with her knee replacement. Her aches and pains seem to be more mechanical related at this time. She can get back with  [place] after her surgery if she is having recurrent bladder symptoms consider seeing urology again.  We did another urine culture to confirm will let her know those results and send copy of this note to her surgeon. -Patient advised to return or notify health care team  if symptoms worsen ,persist or new concerns arise.  Patient Instructions   Urinalysis is clear today   Blood work reassuring . NO signs of infection   Last urine showed no growth of bacterial .  Will let you know results when back .  It is possible that IC could come back but this is not dangerous going into surgery and not infection.   If having continuing problems   After surgery then contact us and we can get urology to see your   Lab Results  Component Value Date   WBC 4.4 11/10/2015   HGB 13.2 11/10/2015   HCT 41.4 11/10/2015   PLT 180 11/10/2015   GLUCOSE 100 (H) 11/10/2015   CHOL 155 10/05/2014   TRIG 78.0 10/05/2014   HDL 71.20 10/05/2014   LDLDIRECT 179.1 10/09/2007   LDLCALC 68 10/05/2014   ALT 13 10/05/2014   AST 16 10/05/2014   NA 140 11/10/2015   K 4.5 11/10/2015   CL 106 11/10/2015   CREATININE 0.93 11/10/2015   BUN 19 11/10/2015   CO2 28 11/10/2015   TSH 1.20 10/05/2014   INR 0.98 11/10/2015   HGBA1C 5.7 10/05/2014       Wanda K. Panosh M.D.

## 2015-11-13 NOTE — Patient Instructions (Addendum)
Urinalysis is clear today   Blood work reassuring . NO signs of infection   Last urine showed no growth of bacterial .  Will let you know results when back .  It is possible that IC could come back but this is not dangerous going into surgery and not infection.   If having continuing problems   After surgery then contact us and we can get urology to see your   Lab Results  Component Value Date   WBC 4.4 11/10/2015   HGB 13.2 11/10/2015   HCT 41.4 11/10/2015   PLT 180 11/10/2015   GLUCOSE 100 (H) 11/10/2015   CHOL 155 10/05/2014   TRIG 78.0 10/05/2014   HDL 71.20 10/05/2014   LDLDIRECT 179.1 10/09/2007   LDLCALC 68 10/05/2014   ALT 13 10/05/2014   AST 16 10/05/2014   NA 140 11/10/2015   K 4.5 11/10/2015   CL 106 11/10/2015   CREATININE 0.93 11/10/2015   BUN 19 11/10/2015   CO2 28 11/10/2015   TSH 1.20 10/05/2014   INR 0.98 11/10/2015   HGBA1C 5.7 10/05/2014

## 2015-11-15 LAB — URINE CULTURE: Organism ID, Bacteria: NO GROWTH

## 2015-11-17 ENCOUNTER — Inpatient Hospital Stay (HOSPITAL_COMMUNITY)
Admission: RE | Admit: 2015-11-17 | Discharge: 2015-11-20 | DRG: 470 | Disposition: A | Discharge status: Skilled Nursing Facility | Payer: Medicare Other | Source: Ambulatory Visit | Attending: Specialist | Admitting: Specialist

## 2015-11-17 ENCOUNTER — Inpatient Hospital Stay (HOSPITAL_COMMUNITY): Payer: Medicare Other | Admitting: Certified Registered Nurse Anesthetist

## 2015-11-17 ENCOUNTER — Encounter (HOSPITAL_COMMUNITY)
Admission: RE | Disposition: A | Discharge status: Skilled Nursing Facility | Payer: Self-pay | Source: Ambulatory Visit | Attending: Specialist

## 2015-11-17 ENCOUNTER — Encounter (HOSPITAL_COMMUNITY): Payer: Self-pay | Admitting: *Deleted

## 2015-11-17 DIAGNOSIS — I2510 Atherosclerotic heart disease of native coronary artery without angina pectoris: Secondary | ICD-10-CM | POA: Diagnosis present

## 2015-11-17 DIAGNOSIS — M797 Fibromyalgia: Secondary | ICD-10-CM | POA: Diagnosis present

## 2015-11-17 DIAGNOSIS — Z96652 Presence of left artificial knee joint: Secondary | ICD-10-CM | POA: Diagnosis not present

## 2015-11-17 DIAGNOSIS — M25561 Pain in right knee: Secondary | ICD-10-CM | POA: Diagnosis present

## 2015-11-17 DIAGNOSIS — Z96611 Presence of right artificial shoulder joint: Secondary | ICD-10-CM | POA: Diagnosis present

## 2015-11-17 DIAGNOSIS — K219 Gastro-esophageal reflux disease without esophagitis: Secondary | ICD-10-CM | POA: Diagnosis not present

## 2015-11-17 DIAGNOSIS — Z471 Aftercare following joint replacement surgery: Secondary | ICD-10-CM | POA: Diagnosis not present

## 2015-11-17 DIAGNOSIS — E669 Obesity, unspecified: Secondary | ICD-10-CM | POA: Diagnosis present

## 2015-11-17 DIAGNOSIS — Z96659 Presence of unspecified artificial knee joint: Secondary | ICD-10-CM

## 2015-11-17 DIAGNOSIS — M1711 Unilateral primary osteoarthritis, right knee: Secondary | ICD-10-CM | POA: Diagnosis not present

## 2015-11-17 DIAGNOSIS — R262 Difficulty in walking, not elsewhere classified: Secondary | ICD-10-CM | POA: Diagnosis not present

## 2015-11-17 DIAGNOSIS — I129 Hypertensive chronic kidney disease with stage 1 through stage 4 chronic kidney disease, or unspecified chronic kidney disease: Secondary | ICD-10-CM | POA: Diagnosis present

## 2015-11-17 DIAGNOSIS — Z6831 Body mass index (BMI) 31.0-31.9, adult: Secondary | ICD-10-CM | POA: Diagnosis not present

## 2015-11-17 DIAGNOSIS — E785 Hyperlipidemia, unspecified: Secondary | ICD-10-CM | POA: Diagnosis not present

## 2015-11-17 DIAGNOSIS — I10 Essential (primary) hypertension: Secondary | ICD-10-CM | POA: Diagnosis not present

## 2015-11-17 DIAGNOSIS — Z96651 Presence of right artificial knee joint: Secondary | ICD-10-CM | POA: Diagnosis not present

## 2015-11-17 DIAGNOSIS — M179 Osteoarthritis of knee, unspecified: Secondary | ICD-10-CM | POA: Diagnosis not present

## 2015-11-17 DIAGNOSIS — K449 Diaphragmatic hernia without obstruction or gangrene: Secondary | ICD-10-CM | POA: Diagnosis present

## 2015-11-17 DIAGNOSIS — N189 Chronic kidney disease, unspecified: Secondary | ICD-10-CM | POA: Diagnosis present

## 2015-11-17 DIAGNOSIS — M6281 Muscle weakness (generalized): Secondary | ICD-10-CM | POA: Diagnosis not present

## 2015-11-17 HISTORY — PX: TOTAL KNEE ARTHROPLASTY: SHX125

## 2015-11-17 LAB — TYPE AND SCREEN
ABO/RH(D): A POS
Antibody Screen: NEGATIVE

## 2015-11-17 SURGERY — ARTHROPLASTY, KNEE, TOTAL
Anesthesia: Spinal | Site: Knee | Laterality: Right

## 2015-11-17 MED ORDER — MIDAZOLAM HCL 2 MG/2ML IJ SOLN
INTRAMUSCULAR | Status: AC
  Filled 2015-11-17: qty 2

## 2015-11-17 MED ORDER — PROPOFOL 500 MG/50ML IV EMUL
INTRAVENOUS | Status: DC | PRN
  Administered 2015-11-17: 40 ug/kg/min via INTRAVENOUS

## 2015-11-17 MED ORDER — DEXAMETHASONE SODIUM PHOSPHATE 10 MG/ML IJ SOLN: INTRAMUSCULAR | Status: DC | PRN

## 2015-11-17 MED ORDER — FENTANYL CITRATE (PF) 100 MCG/2ML IJ SOLN
INTRAMUSCULAR | Status: AC
  Filled 2015-11-17: qty 2

## 2015-11-17 MED ORDER — PHENYLEPHRINE 40 MCG/ML (10ML) SYRINGE FOR IV PUSH (FOR BLOOD PRESSURE SUPPORT)
PREFILLED_SYRINGE | INTRAVENOUS | Status: AC
  Filled 2015-11-17: qty 10

## 2015-11-17 MED ORDER — ONDANSETRON HCL 4 MG/2ML IJ SOLN
INTRAMUSCULAR | Status: DC | PRN
  Administered 2015-11-17: 4 mg via INTRAVENOUS

## 2015-11-17 MED ORDER — ONDANSETRON HCL 4 MG/2ML IJ SOLN
INTRAMUSCULAR | Status: AC
  Filled 2015-11-17: qty 2

## 2015-11-17 MED ORDER — BUPIVACAINE IN DEXTROSE 0.75-8.25 % IT SOLN
INTRATHECAL | Status: DC | PRN
  Administered 2015-11-17: 1.8 mL via INTRATHECAL

## 2015-11-17 MED ORDER — OXYCODONE HCL 5 MG PO TABS
5.0000 mg | ORAL_TABLET | ORAL | Status: DC | PRN
  Administered 2015-11-17: 5 mg via ORAL
  Administered 2015-11-18 – 2015-11-20 (×10): 10 mg via ORAL
  Filled 2015-11-17 (×8): qty 2
  Filled 2015-11-17: qty 1
  Filled 2015-11-17 (×2): qty 2

## 2015-11-17 MED ORDER — ACETAMINOPHEN 325 MG PO TABS: 650.0000 mg | ORAL_TABLET | Freq: Four times a day (QID) | ORAL | Status: DC | PRN

## 2015-11-17 MED ORDER — SACCHAROMYCES BOULARDII 250 MG PO CAPS
250.0000 mg | ORAL_CAPSULE | Freq: Two times a day (BID) | ORAL | Status: DC
  Administered 2015-11-18 – 2015-11-19 (×4): 250 mg via ORAL
  Filled 2015-11-17 (×4): qty 1

## 2015-11-17 MED ORDER — DEXAMETHASONE SODIUM PHOSPHATE 10 MG/ML IJ SOLN
INTRAMUSCULAR | Status: AC
  Filled 2015-11-17: qty 1

## 2015-11-17 MED ORDER — DEXAMETHASONE SODIUM PHOSPHATE 10 MG/ML IJ SOLN
10.0000 mg | Freq: Once | INTRAMUSCULAR | Status: AC
  Administered 2015-11-18: 10 mg via INTRAVENOUS
  Filled 2015-11-17: qty 1

## 2015-11-17 MED ORDER — KETOROLAC TROMETHAMINE 30 MG/ML IJ SOLN
INTRAMUSCULAR | Status: AC
  Filled 2015-11-17: qty 1

## 2015-11-17 MED ORDER — BUPIVACAINE-EPINEPHRINE 0.25% -1:200000 IJ SOLN
INTRAMUSCULAR | Status: DC | PRN
  Administered 2015-11-17: 30 mL

## 2015-11-17 MED ORDER — METOPROLOL TARTRATE 25 MG PO TABS
25.0000 mg | ORAL_TABLET | Freq: Two times a day (BID) | ORAL | Status: DC
  Administered 2015-11-18 – 2015-11-20 (×6): 25 mg via ORAL
  Filled 2015-11-17 (×6): qty 1

## 2015-11-17 MED ORDER — HYDROMORPHONE HCL 1 MG/ML IJ SOLN
1.0000 mg | INTRAMUSCULAR | Status: DC | PRN
  Administered 2015-11-17: 1 mg via INTRAVENOUS
  Filled 2015-11-17: qty 1

## 2015-11-17 MED ORDER — ALBUTEROL SULFATE (2.5 MG/3ML) 0.083% IN NEBU: 3.0000 mL | INHALATION_SOLUTION | Freq: Four times a day (QID) | RESPIRATORY_TRACT | Status: DC | PRN

## 2015-11-17 MED ORDER — CEFAZOLIN SODIUM-DEXTROSE 2-4 GM/100ML-% IV SOLN
2.0000 g | INTRAVENOUS | Status: AC
  Administered 2015-11-17: 2 g via INTRAVENOUS
  Filled 2015-11-17: qty 100

## 2015-11-17 MED ORDER — SODIUM CHLORIDE 0.9 % IR SOLN
Status: DC | PRN
  Administered 2015-11-17: 3000 mL

## 2015-11-17 MED ORDER — FERROUS SULFATE 325 (65 FE) MG PO TABS
325.0000 mg | ORAL_TABLET | Freq: Three times a day (TID) | ORAL | Status: DC
  Administered 2015-11-18 – 2015-11-20 (×7): 325 mg via ORAL
  Filled 2015-11-17 (×7): qty 1

## 2015-11-17 MED ORDER — METOCLOPRAMIDE HCL 5 MG PO TABS: 5.0000 mg | ORAL_TABLET | Freq: Three times a day (TID) | ORAL | Status: DC | PRN

## 2015-11-17 MED ORDER — ACETAMINOPHEN 650 MG RE SUPP: 650.0000 mg | Freq: Four times a day (QID) | RECTAL | Status: DC | PRN

## 2015-11-17 MED ORDER — METOPROLOL TARTRATE 50 MG PO TABS
50.0000 mg | ORAL_TABLET | Freq: Two times a day (BID) | ORAL | Status: DC
  Filled 2015-11-17: qty 1

## 2015-11-17 MED ORDER — AMLODIPINE BESYLATE 5 MG PO TABS
5.0000 mg | ORAL_TABLET | Freq: Every day | ORAL | Status: DC
  Administered 2015-11-18 – 2015-11-20 (×3): 5 mg via ORAL
  Filled 2015-11-17 (×3): qty 1

## 2015-11-17 MED ORDER — ONDANSETRON HCL 4 MG/2ML IJ SOLN: 4.0000 mg | Freq: Four times a day (QID) | INTRAMUSCULAR | Status: DC | PRN

## 2015-11-17 MED ORDER — EPHEDRINE SULFATE 50 MG/ML IJ SOLN
INTRAMUSCULAR | Status: DC | PRN
  Administered 2015-11-17: 5 mg via INTRAVENOUS

## 2015-11-17 MED ORDER — ROSUVASTATIN CALCIUM 40 MG PO TABS
40.0000 mg | ORAL_TABLET | Freq: Every day | ORAL | Status: DC
Start: 2015-11-17 — End: 2015-11-20
  Administered 2015-11-17 – 2015-11-19 (×3): 40 mg via ORAL
  Filled 2015-11-17: qty 2
  Filled 2015-11-17: qty 1
  Filled 2015-11-17: qty 2
  Filled 2015-11-17 (×2): qty 1
  Filled 2015-11-17: qty 2
  Filled 2015-11-17: qty 1

## 2015-11-17 MED ORDER — ONDANSETRON HCL 4 MG PO TABS: 4.0000 mg | ORAL_TABLET | Freq: Four times a day (QID) | ORAL | Status: DC | PRN

## 2015-11-17 MED ORDER — CYANOCOBALAMIN 250 MCG PO TABS
250.0000 ug | ORAL_TABLET | Freq: Every day | ORAL | Status: DC
  Administered 2015-11-18 – 2015-11-20 (×3): 250 ug via ORAL
  Filled 2015-11-17 (×4): qty 1

## 2015-11-17 MED ORDER — METOCLOPRAMIDE HCL 5 MG/ML IJ SOLN: 5.0000 mg | Freq: Three times a day (TID) | INTRAMUSCULAR | Status: DC | PRN

## 2015-11-17 MED ORDER — LACTATED RINGERS IV SOLN
INTRAVENOUS | Status: DC
  Administered 2015-11-17 (×3): via INTRAVENOUS

## 2015-11-17 MED ORDER — MIDAZOLAM HCL 5 MG/5ML IJ SOLN
INTRAMUSCULAR | Status: DC | PRN
  Administered 2015-11-17: 2 mg via INTRAVENOUS

## 2015-11-17 MED ORDER — MENTHOL 3 MG MT LOZG: 1.0000 | LOZENGE | OROMUCOSAL | Status: DC | PRN

## 2015-11-17 MED ORDER — SODIUM CHLORIDE 0.9 % IJ SOLN
INTRAMUSCULAR | Status: DC | PRN
  Administered 2015-11-17: 30 mL

## 2015-11-17 MED ORDER — BUPIVACAINE-EPINEPHRINE (PF) 0.25% -1:200000 IJ SOLN
INTRAMUSCULAR | Status: AC
  Filled 2015-11-17: qty 30

## 2015-11-17 MED ORDER — FENTANYL CITRATE (PF) 100 MCG/2ML IJ SOLN
INTRAMUSCULAR | Status: DC | PRN
  Administered 2015-11-17 (×3): 50 ug via INTRAVENOUS

## 2015-11-17 MED ORDER — PROMETHAZINE HCL 25 MG/ML IJ SOLN: 6.2500 mg | INTRAMUSCULAR | Status: DC | PRN

## 2015-11-17 MED ORDER — TRAMADOL HCL 50 MG PO TABS: 50.0000 mg | ORAL_TABLET | Freq: Every evening | ORAL | Status: DC | PRN

## 2015-11-17 MED ORDER — PHENOL 1.4 % MT LIQD
1.0000 | OROMUCOSAL | Status: DC | PRN
  Filled 2015-11-17: qty 177

## 2015-11-17 MED ORDER — CEFAZOLIN SODIUM-DEXTROSE 2-4 GM/100ML-% IV SOLN
2.0000 g | Freq: Four times a day (QID) | INTRAVENOUS | Status: AC
  Administered 2015-11-17 – 2015-11-18 (×2): 2 g via INTRAVENOUS
  Filled 2015-11-17 (×2): qty 100

## 2015-11-17 MED ORDER — POVIDONE-IODINE 7.5 % EX SOLN: Freq: Once | CUTANEOUS | Status: DC

## 2015-11-17 MED ORDER — ASPIRIN EC 325 MG PO TBEC: 325.0000 mg | DELAYED_RELEASE_TABLET | Freq: Two times a day (BID) | ORAL | 0 refills | Status: DC

## 2015-11-17 MED ORDER — ENOXAPARIN SODIUM 30 MG/0.3ML ~~LOC~~ SOLN
30.0000 mg | Freq: Two times a day (BID) | SUBCUTANEOUS | Status: DC
  Administered 2015-11-18 – 2015-11-20 (×5): 30 mg via SUBCUTANEOUS
  Filled 2015-11-17 (×5): qty 0.3

## 2015-11-17 MED ORDER — HYDROMORPHONE HCL 2 MG PO TABS: 2.0000 mg | ORAL_TABLET | ORAL | 0 refills | Status: DC | PRN

## 2015-11-17 MED ORDER — PANTOPRAZOLE SODIUM 40 MG PO TBEC: 40.0000 mg | DELAYED_RELEASE_TABLET | Freq: Every day | ORAL | Status: DC

## 2015-11-17 MED ORDER — HYDROMORPHONE HCL 1 MG/ML IJ SOLN: 0.2500 mg | INTRAMUSCULAR | Status: DC | PRN

## 2015-11-17 MED ORDER — ALUM & MAG HYDROXIDE-SIMETH 200-200-20 MG/5ML PO SUSP
30.0000 mL | ORAL | Status: DC | PRN
  Administered 2015-11-18 – 2015-11-19 (×2): 30 mL via ORAL
  Filled 2015-11-17 (×2): qty 30

## 2015-11-17 MED ORDER — DEXAMETHASONE SODIUM PHOSPHATE 10 MG/ML IJ SOLN
10.0000 mg | Freq: Once | INTRAMUSCULAR | Status: AC
  Administered 2015-11-17: 10 mg via INTRAVENOUS

## 2015-11-17 MED ORDER — METHOCARBAMOL 500 MG PO TABS: 500.0000 mg | ORAL_TABLET | Freq: Four times a day (QID) | ORAL | Status: DC | PRN

## 2015-11-17 MED ORDER — PROPOFOL 10 MG/ML IV BOLUS
INTRAVENOUS | Status: DC | PRN
  Administered 2015-11-17: 130 mg via INTRAVENOUS
  Administered 2015-11-17: 10 mg via INTRAVENOUS

## 2015-11-17 MED ORDER — PHENYLEPHRINE HCL 10 MG/ML IJ SOLN
INTRAMUSCULAR | Status: DC | PRN
  Administered 2015-11-17 (×4): 40 ug via INTRAVENOUS

## 2015-11-17 MED ORDER — CEFAZOLIN SODIUM-DEXTROSE 2-4 GM/100ML-% IV SOLN
INTRAVENOUS | Status: AC
  Filled 2015-11-17: qty 100

## 2015-11-17 MED ORDER — DULOXETINE HCL 60 MG PO CPEP
60.0000 mg | ORAL_CAPSULE | Freq: Two times a day (BID) | ORAL | Status: DC
Start: 2015-11-17 — End: 2015-11-20
  Administered 2015-11-17 – 2015-11-20 (×6): 60 mg via ORAL
  Filled 2015-11-17 (×2): qty 2
  Filled 2015-11-17 (×5): qty 1
  Filled 2015-11-17 (×3): qty 2
  Filled 2015-11-17: qty 1
  Filled 2015-11-17: qty 2

## 2015-11-17 MED ORDER — SODIUM CHLORIDE 0.9 % IJ SOLN
INTRAMUSCULAR | Status: AC
  Filled 2015-11-17: qty 50

## 2015-11-17 MED ORDER — ALIGN PO CAPS: 1.0000 | ORAL_CAPSULE | Freq: Every day | ORAL | Status: DC

## 2015-11-17 MED ORDER — BACLOFEN 10 MG PO TABS: 10.0000 mg | ORAL_TABLET | Freq: Three times a day (TID) | ORAL | 2 refills | Status: DC | PRN

## 2015-11-17 MED ORDER — B-12 250 MCG PO TABS: ORAL_TABLET | Freq: Every day | ORAL | Status: DC

## 2015-11-17 MED ORDER — DEXTROSE 5 % IV SOLN
500.0000 mg | Freq: Four times a day (QID) | INTRAVENOUS | Status: DC | PRN
  Administered 2015-11-17: 500 mg via INTRAVENOUS
  Filled 2015-11-17: qty 550
  Filled 2015-11-17: qty 5

## 2015-11-17 MED ORDER — PROPOFOL 10 MG/ML IV BOLUS
INTRAVENOUS | Status: AC
  Filled 2015-11-17: qty 60

## 2015-11-17 MED ORDER — SODIUM CHLORIDE 0.9 % IR SOLN
Status: DC | PRN
  Administered 2015-11-17: 1000 mL

## 2015-11-17 SURGICAL SUPPLY — 58 items
BAG DECANTER FOR FLEXI CONT (MISCELLANEOUS) IMPLANT
BAG SPEC THK2 15X12 ZIP CLS (MISCELLANEOUS) ×2
BAG ZIPLOCK 12X15 (MISCELLANEOUS) ×4 IMPLANT
BANDAGE ACE 4X5 VEL STRL LF (GAUZE/BANDAGES/DRESSINGS) ×2 IMPLANT
BANDAGE ACE 6X5 VEL STRL LF (GAUZE/BANDAGES/DRESSINGS) ×2 IMPLANT
BLADE SAG 18X100X1.27 (BLADE) ×2 IMPLANT
BLADE SAW SGTL 13.0X1.19X90.0M (BLADE) ×2 IMPLANT
CAP KNEE TOTAL 3 SIGMA ×1 IMPLANT
CEMENT HV SMART SET (Cement) ×2 IMPLANT
CLOTH BEACON ORANGE TIMEOUT ST (SAFETY) ×2 IMPLANT
CUFF TOURN SGL QUICK 34 (TOURNIQUET CUFF) ×2
CUFF TRNQT CYL 34X4X40X1 (TOURNIQUET CUFF) ×1 IMPLANT
DECANTER SPIKE VIAL GLASS SM (MISCELLANEOUS) ×2 IMPLANT
DRAPE U-SHAPE 47X51 STRL (DRAPES) ×2 IMPLANT
DRSG AQUACEL AG ADV 3.5X10 (GAUZE/BANDAGES/DRESSINGS) ×2 IMPLANT
DRSG TEGADERM 4X4.75 (GAUZE/BANDAGES/DRESSINGS) ×2 IMPLANT
DURAPREP 26ML APPLICATOR (WOUND CARE) ×4 IMPLANT
ELECT REM PT RETURN 9FT ADLT (ELECTROSURGICAL) ×2
ELECTRODE REM PT RTRN 9FT ADLT (ELECTROSURGICAL) ×1 IMPLANT
EVACUATOR 1/8 PVC DRAIN (DRAIN) ×2 IMPLANT
GAUZE SPONGE 2X2 8PLY STRL LF (GAUZE/BANDAGES/DRESSINGS) ×1 IMPLANT
GLOVE BIOGEL PI IND STRL 8 (GLOVE) ×2 IMPLANT
GLOVE BIOGEL PI INDICATOR 8 (GLOVE) ×2
GLOVE SURG ORTHO 8.0 STRL STRW (GLOVE) ×2 IMPLANT
GLOVE SURG ORTHO 9.0 STRL STRW (GLOVE) ×2 IMPLANT
GLOVE SURG SS PI 7.5 STRL IVOR (GLOVE) ×2 IMPLANT
GOWN STRL REUS W/TWL XL LVL3 (GOWN DISPOSABLE) ×4 IMPLANT
HANDPIECE INTERPULSE COAX TIP (DISPOSABLE) ×2
IMMOBILIZER KNEE 20 (SOFTGOODS) ×2
IMMOBILIZER KNEE 20 THIGH 36 (SOFTGOODS) ×1 IMPLANT
LIQUID BAND (GAUZE/BANDAGES/DRESSINGS) ×1 IMPLANT
NS IRRIG 1000ML POUR BTL (IV SOLUTION) ×2 IMPLANT
PACK TOTAL KNEE CUSTOM (KITS) ×2 IMPLANT
POSITIONER SURGICAL ARM (MISCELLANEOUS) ×2 IMPLANT
SET HNDPC FAN SPRY TIP SCT (DISPOSABLE) ×1 IMPLANT
SET PAD KNEE POSITIONER (MISCELLANEOUS) ×2 IMPLANT
SPONGE GAUZE 2X2 STER 10/PKG (GAUZE/BANDAGES/DRESSINGS) ×1
SPONGE LAP 18X18 X RAY DECT (DISPOSABLE) IMPLANT
SPONGE SURGIFOAM ABS GEL 100 (HEMOSTASIS) ×2 IMPLANT
STOCKINETTE 6  STRL (DRAPES) ×1
STOCKINETTE 6 STRL (DRAPES) ×1 IMPLANT
SUCTION FRAZIER HANDLE 12FR (TUBING) ×1
SUCTION TUBE FRAZIER 12FR DISP (TUBING) ×1 IMPLANT
SUT BONE WAX W31G (SUTURE) IMPLANT
SUT MNCRL AB 3-0 PS2 18 (SUTURE) ×2 IMPLANT
SUT VIC AB 1 CT1 27 (SUTURE) ×8
SUT VIC AB 1 CT1 27XBRD ANTBC (SUTURE) ×4 IMPLANT
SUT VIC AB 2-0 CT1 27 (SUTURE) ×4
SUT VIC AB 2-0 CT1 TAPERPNT 27 (SUTURE) ×2 IMPLANT
SUT VLOC 180 0 24IN GS25 (SUTURE) ×2 IMPLANT
SYR 50ML LL SCALE MARK (SYRINGE) ×2 IMPLANT
TAPE STRIPS DRAPE STRL (GAUZE/BANDAGES/DRESSINGS) ×2 IMPLANT
TOWER CARTRIDGE SMART MIX (DISPOSABLE) ×2 IMPLANT
TRAY FOLEY W/METER SILVER 14FR (SET/KITS/TRAYS/PACK) ×2 IMPLANT
TRAY FOLEY W/METER SILVER 16FR (SET/KITS/TRAYS/PACK) ×2 IMPLANT
WATER STERILE IRR 1500ML POUR (IV SOLUTION) ×4 IMPLANT
WRAP KNEE MAXI GEL POST OP (GAUZE/BANDAGES/DRESSINGS) ×2 IMPLANT
YANKAUER SUCT BULB TIP 10FT TU (MISCELLANEOUS) ×2 IMPLANT

## 2015-11-17 NOTE — Interval H&P Note (Signed)
History and Physical Interval Note:  11/17/2015 1:28 PM  [name] [name]  has presented today for surgery, with the diagnosis of right knee osteoarthritis  The various methods of treatment have been discussed with the patient and family. After consideration of risks, benefits and other options for treatment, the patient has consented to  Procedure(s): RIGHT TOTAL KNEE ARTHROPLASTY (Right) as a surgical intervention .  The patient's history has been reviewed, patient examined, no change in status, stable for surgery.  I have reviewed the patient's chart and labs.  Questions were answered to the patient's satisfaction.     Matia Zelada ANDREW

## 2015-11-17 NOTE — Transfer of Care (Signed)
Immediate Anesthesia Transfer of Care Note  Patient: [name] [name]  Procedure(s) Performed: Procedure(s): RIGHT TOTAL KNEE ARTHROPLASTY (Right)  Patient Location: PACU  Anesthesia Type:General  Level of Consciousness:  sedated, patient cooperative and responds to stimulation  Airway & Oxygen Therapy:Patient Spontanous Breathing and Patient connected to face mask oxgen  Post-op Assessment:  Report given to PACU RN and Post -op Vital signs reviewed and stable  Post vital signs:  Reviewed and stable  Last Vitals:  Vitals:   11/17/15 1115  BP: (!) 161/76  Pulse: 73  Resp: 16  Temp: [id number] C    Complications: No apparent anesthesia complications, spinal at L2 (reduced sensation)

## 2015-11-17 NOTE — Anesthesia Procedure Notes (Signed)
Procedure Name: LMA Insertion Date/Time: 11/17/2015 2:17 PM Performed by: Montel Clock Pre-anesthesia Checklist: Patient identified, Emergency Drugs available, Suction available, Patient being monitored and Timeout performed Patient Re-evaluated:Patient Re-evaluated prior to inductionOxygen Delivery Method: Circle system utilized Preoxygenation: Pre-oxygenation with 100% oxygen Intubation Type: IV induction Ventilation: Mask ventilation without difficulty LMA: LMA with gastric port inserted LMA Size: 3.0 Number of attempts: 1 Dental Injury: Teeth and Oropharynx as per pre-operative assessment

## 2015-11-17 NOTE — Op Note (Signed)
DATE OF SURGERY:  11/17/2015  TIME: 3:31 PM  PATIENT NAME:  [name] [name]    AGE: 75 y.o.   PRE-OPERATIVE DIAGNOSIS:  right knee osteoarthritis  POST-OPERATIVE DIAGNOSIS:  right knee osteoarthritis  PROCEDURE:  Procedure(s): RIGHT TOTAL KNEE ARTHROPLASTY  SURGEON:  Gray Maugeri ANDREW  ASSISTANT:  Bryson Stilwell, PA-C, present and scrubbed throughout the case, critical for assistance with exposure, retraction, instrumentation, and closure.  OPERATIVE IMPLANTS: Depuy PFC Sigma Rotating Platform.  Femur size 3, Tibia size 3, Patella size 35 3-peg oval button, with a 15 mm polyethylene insert.   PREOPERATIVE INDICATIONS:   [name] [name] is a 75 y.o. year old female with end stage bone on bone arthritis of the knee who failed conservative treatment and elected for Total Knee Arthroplasty.   The risks, benefits, and alternatives were discussed at length including but not limited to the risks of infection, bleeding, nerve injury, stiffness, blood clots, the need for revision surgery, cardiopulmonary complications, among others, and they were willing to proceed.  OPERATIVE DESCRIPTION:  The patient was brought to the operative room and placed in a supine position.  Spinal anesthesia was administered.  IV antibiotics were given.  The lower extremity was prepped and draped in the usual sterile fashion.  Time out was performed.  The leg was elevated and exsanguinated and the tourniquet was inflated.  Anterior quadriceps tendon splitting approach was performed.  The patella was retracted and osteophytes were removed.  The anterior horn of the medial and lateral meniscus was removed and cruciate ligaments resected.   The distal femur was opened with the drill and the intramedullary distal femoral cutting jig was utilized, set at 5 degrees resecting 10 mm off the distal femur.  Care was taken to protect the collateral ligaments.  The distal femoral sizing jig was applied, taking care to avoid  notching.  Then the 4-in-1 cutting jig was applied and the anterior and posterior femur was cut, along with the chamfer cuts.    Then the extramedullary tibial cutting jig was utilized making the appropriate cut using the anterior tibial crest as a reference building in appropriate posterior slope.  Care was taken during the cut to protect the medial and collateral ligaments.  The proximal tibia was removed along with the posterior horns of the menisci.   The posterior medial femoral osteophytes and posterior lateral femoral osteophytes were removed.    The flexion gap was then measured and was symmetric with the extension gap, measured at 15.  I completed the distal femoral preparation using the appropriate jig to prepare the box.  The patella was then measured, and cut with the saw.    The proximal tibia sized and prepared accordingly with the reamer and the punch, and then all components were trialed with the trial insert.  The knee was found to have excellent balance and full motion.    The above named components were then cemented into place and all excess cement was removed.  The trial polyethylene component was in place during cementation, and then was exchanged for the real polyethylene component.    The knee was easily taken through a range of motion and the patella tracked well and the knee irrigated copiously and the parapatellar and subcutaneous tissue closed with vicryl, and monocryl with steri strips for the skin.  The arthrotomy was closed at 90 of flexion. The wounds were dressed with sterile gauze and the tourniquet released and the patient was awakened and returned to the PACU  in stable and satisfactory condition.  There were no complications.  Total tourniquet time was 90 minutes.

## 2015-11-17 NOTE — Anesthesia Preprocedure Evaluation (Addendum)
Anesthesia Evaluation  Patient identified by MRN, date of birth, ID band Patient awake and Patient confused    Reviewed: Allergy & Precautions, NPO status , Patient's Chart, lab work & pertinent test results  History of Anesthesia Complications (+) PONV, AWARENESS UNDER ANESTHESIA and history of anesthetic complications  Airway Mallampati: II  TM Distance: >3 FB Neck ROM: Full    Dental no notable dental hx.    Pulmonary neg pulmonary ROS,    Pulmonary exam normal breath sounds clear to auscultation       Cardiovascular hypertension, + CAD  Normal cardiovascular exam Rhythm:Regular Rate:Normal     Neuro/Psych PSYCHIATRIC DISORDERS Anxiety Depression Schizophrenia H/O back pain, scoliosis. She has been evaluated by Dr. Roselee Culver with possible surgery next year.  Neuromuscular disease    GI/Hepatic Neg liver ROS, hiatal hernia, GERD  ,  Endo/Other  negative endocrine ROS  Renal/GU Renal disease  negative genitourinary   Musculoskeletal  (+) Arthritis , Fibromyalgia -  Abdominal (+) + obese,   Peds negative pediatric ROS (+)  Hematology  (+) anemia ,   Anesthesia Other Findings   Reproductive/Obstetrics negative OB ROS                            Anesthesia Physical Anesthesia Plan  ASA: II  Anesthesia Plan: Spinal   Post-op Pain Management:    Induction: Intravenous  Airway Management Planned: Natural Airway  Additional Equipment:   Intra-op Plan: Utilization of Controlled Hypotension per surrgeon request  Post-operative Plan:   Informed Consent: I have reviewed the patients History and Physical, chart, labs and discussed the procedure including the risks, benefits and alternatives for the proposed anesthesia with the patient or authorized representative who has indicated his/her understanding and acceptance.   Dental advisory given  Plan Discussed with: CRNA  Anesthesia Plan  Comments: (Discussed risks and benefits of and differences between spinal and general. Discussed risks of spinal including headache, backache, failure, bleeding and hematoma, infection, and nerve damage. Patient consents to spinal. Questions answered. Coagulation studies and platelet count acceptable.  Longer than usual discussion of spinal versus general anesthesia in setting of scoliosis and back pain. Discussed possible increased risk of failure. She understands and would like to have a spinal.)       Anesthesia Quick Evaluation

## 2015-11-17 NOTE — Anesthesia Procedure Notes (Signed)
Spinal  Patient location during procedure: OR Start time: 11/17/2015 2:35 PM End time: 11/17/2015 2:43 PM Staffing Anesthesiologist: Franne Grip Resident/CRNA: Darlys Gales R Performed: resident/CRNA  Preanesthetic Checklist Completed: patient identified, site marked, surgical consent, pre-op evaluation, timeout performed, IV checked, risks and benefits discussed and monitors and equipment checked Spinal Block Patient position: sitting Prep: Betadine Patient monitoring: heart rate, continuous pulse ox and blood pressure Approach: midline Location: L3-4 Injection technique: single-shot Needle Needle type: Spinocan  Needle gauge: 22 G Needle length: 9 cm Needle insertion depth: 7 cm Additional Notes Expiration date of kit checked and confirmed. Patient tolerated procedure well, without complications.

## 2015-11-18 ENCOUNTER — Encounter (HOSPITAL_COMMUNITY): Payer: Self-pay

## 2015-11-18 LAB — CBC
HCT: 35.2 % — ABNORMAL LOW (ref 36.0–46.0)
HEMOGLOBIN: 11.7 g/dL — AB (ref 12.0–15.0)
MCH: 29.5 pg (ref 26.0–34.0)
MCHC: 33.2 g/dL (ref 30.0–36.0)
MCV: 88.7 fL (ref 78.0–100.0)
PLATELETS: 152 10*3/uL (ref 150–400)
RBC: 3.97 MIL/uL (ref 3.87–5.11)
RDW: 14.3 % (ref 11.5–15.5)
WBC: 8.5 10*3/uL (ref 4.0–10.5)

## 2015-11-18 LAB — BASIC METABOLIC PANEL
Anion gap: 7 (ref 5–15)
BUN: 13 mg/dL (ref 6–20)
CHLORIDE: 106 mmol/L (ref 101–111)
CO2: 24 mmol/L (ref 22–32)
CREATININE: 0.66 mg/dL (ref 0.44–1.00)
Calcium: 8.9 mg/dL (ref 8.9–10.3)
GFR calc Af Amer: >60 mL/min (ref >60)
GFR calc non Af Amer: >60 mL/min (ref >60)
GLUCOSE: 143 mg/dL — AB (ref 65–99)
Potassium: 4 mmol/L (ref 3.5–5.1)
SODIUM: 137 mmol/L (ref 135–145)

## 2015-11-18 MED ORDER — HYDRALAZINE HCL 20 MG/ML IJ SOLN
10.0000 mg | Freq: Once | INTRAMUSCULAR | Status: AC
  Administered 2015-11-18: 10 mg via INTRAVENOUS
  Filled 2015-11-18: qty 1

## 2015-11-18 MED ORDER — ACETAMINOPHEN 325 MG PO TABS: 650.0000 mg | ORAL_TABLET | Freq: Once | ORAL | Status: DC

## 2015-11-18 MED ORDER — OMEPRAZOLE 20 MG PO CPDR
20.0000 mg | DELAYED_RELEASE_CAPSULE | Freq: Two times a day (BID) | ORAL | Status: DC
  Administered 2015-11-18 – 2015-11-20 (×5): 20 mg via ORAL
  Filled 2015-11-18 (×5): qty 1

## 2015-11-18 MED ORDER — FUROSEMIDE 10 MG/ML IJ SOLN
10.0000 mg | Freq: Once | INTRAMUSCULAR | Status: DC
Start: 2015-11-18 — End: 2015-11-18
  Filled 2015-11-18: qty 1

## 2015-11-18 MED ORDER — SODIUM CHLORIDE 0.9 % IV SOLN: Freq: Once | INTRAVENOUS | Status: DC

## 2015-11-18 NOTE — Anesthesia Postprocedure Evaluation (Signed)
Anesthesia Post Note  Patient: [name] [name]  Procedure(s) Performed: Procedure(s) (LRB): RIGHT TOTAL KNEE ARTHROPLASTY (Right)  Patient location during evaluation: PACU Anesthesia Type: General and Spinal Level of consciousness: awake and alert Pain management: pain level controlled Vital Signs Assessment: post-procedure vital signs reviewed and stable Respiratory status: spontaneous breathing, nonlabored ventilation, respiratory function stable and patient connected to nasal cannula oxygen Cardiovascular status: blood pressure returned to baseline and stable Postop Assessment: no signs of nausea or vomiting and spinal receding Anesthetic complications: no    Last Vitals:  Vitals:   11/18/15 0944 11/18/15 1355  BP: 136/60 (!) 150/68  Pulse: 69 65  Resp: 16 16  Temp: 36.8 C 36.8 C    Last Pain:  Vitals:   11/18/15 1542  TempSrc:   PainSc: 2                  Doran Nestle J

## 2015-11-18 NOTE — Care Management Note (Signed)
Case Management Note  Patient Details  Name: [name] [name] MRN: [id number] Date of Birth: 1940-12-09  Subjective/Objective:      s/p R TKR              Action/Plan: Discharge Planning:   NCM spoke to pt and states she was at [facility] for rehab. Pt states she lives at home. Sister, Collins [place] will assist as needed. Pt has RW, cane and bedside commode. CSW referral for SNF placement.   Expected Discharge Date:  11/20/15               Expected Discharge Plan:  Skilled Nursing Facility  In-House Referral:  Clinical Social Work  Discharge planning Services  CM Consult  Post Acute Care Choice:  NA Choice offered to:  NA  DME Arranged:  N/A DME Agency:  NA  HH Arranged:  NA HH Agency:  NA  Status of Service:  Completed, signed off  If discussed at [employer] of Stay Meetings, dates discussed:    Additional Comments:  Erenest Rasher, RN 11/18/2015, 3:53 PM

## 2015-11-18 NOTE — Evaluation (Signed)
Physical Therapy Evaluation Patient Details Name: [name] [name] MRN: [id number] DOB: Jun 27, 1940 Today's Date: 11/18/2015   History of Present Illness  s/p R TKR. PMH includes R TSA 2016  Clinical Impression  Pt is s/p TKA resulting in the deficits listed below (see PT Problem List). * Pt will benefit from skilled PT to increase their independence and safety with mobility to allow discharge to the venue listed below.      Follow Up Recommendations SNF    Equipment Recommendations  Rolling walker with 5" wheels    Recommendations for Other Services       Precautions / Restrictions Precautions Precautions: Fall;Knee Required Braces or Orthoses: Knee Immobilizer - Right Knee Immobilizer - Right: On at all times Restrictions Weight Bearing Restrictions: No Other Position/Activity Restrictions: WBAT      Mobility  Bed Mobility Overal bed mobility: Needs Assistance Bed Mobility: Supine to Sit     Supine to sit: Min assist     General bed mobility comments: cues for self assist  Transfers Overall transfer level: Needs assistance Equipment used: Rolling walker (2 wheeled) Transfers: Sit to/from Stand Sit to Stand: Min assist         General transfer comment: assist to rise, cues for hand placement  Ambulation/Gait Ambulation/Gait assistance: Min assist Ambulation Distance (Feet): 4 Feet Assistive device: Rolling walker (2 wheeled) Gait Pattern/deviations: Step-to pattern;Trunk flexed;Antalgic     General Gait Details: cues for sequence, posture, requires incr time  Stairs            Wheelchair Mobility    Modified Rankin (Stroke Patients Only)       Balance Overall balance assessment: Needs assistance   Sitting balance-Leahy Scale: Good       Standing balance-Leahy Scale: Fair                               Pertinent Vitals/Pain Pain Assessment: 0-10 Pain Score: 5  Pain Location: Right knee Pain Descriptors / Indicators:  Aching;Sore Pain Intervention(s): Limited activity within patient's tolerance;Monitored during session    [place] expects to be discharged to:: [place] - single point;Shower seat Additional Comments: lives alone    Prior Function Level of Independence: Independent         Comments: drives; does own housework. etc     Hand Dominance   Dominant Hand: Right    Extremity/Trunk Assessment   Upper Extremity Assessment: Defer to OT evaluation RUE Deficits / Details: residual weakness and pain from shoulder surgery         Lower Extremity Assessment: RLE deficits/detail RLE Deficits / Details: ankle WFL; anticipated post op weakness    Cervical / Trunk Assessment: Normal  Communication   Communication: No difficulties  Cognition Arousal/Alertness: Awake/alert Behavior During Therapy: WFL for tasks assessed/performed Overall Cognitive Status: Within Functional Limits for tasks assessed Area of Impairment:  (talks excessively and requires redirection)                    General Comments      Exercises Total Joint Exercises Ankle Circles/Pumps: AROM;Both;10 reps      Assessment/Plan    PT Assessment Patient needs continued PT services  PT Diagnosis Difficulty walking;Acute pain   PT Problem List Decreased strength;Decreased activity tolerance;Decreased range of motion;Decreased mobility;Decreased knowledge of precautions;Decreased knowledge  of use of DME  PT Treatment Interventions DME instruction;Gait training;Functional mobility training;Therapeutic activities;Therapeutic exercise;Patient/family education   PT Goals (Current goals can be found in the Care Plan section) Acute Rehab PT Goals Patient Stated Goal: to go to rehab then home PT Goal Formulation: With patient Time For Goal Achievement: 11/25/15 Potential to Achieve Goals: Good    Frequency  7X/week   Barriers to discharge        Co-evaluation               End of Session Equipment Utilized During Treatment: Gait belt;Right knee immobilizer Activity Tolerance: Patient tolerated treatment well;Patient limited by fatigue Patient left: in chair;with call bell/phone within reach Nurse Communication: Mobility status         Time: [id number] PT Time Calculation (min) (ACUTE ONLY): 23 min   Charges:   PT Evaluation $PT Eval Low Complexity: 1 Procedure PT Treatments $Therapeutic Activity: 8-22 mins   PT G Codes:        Harmoney Sienkiewicz 12-01-15, 10:50 AM

## 2015-11-18 NOTE — Progress Notes (Signed)
[name] [name] Still well aware of patients manual blood pressure 172/80 at 0116. Nurse medicated patient for pain. Manual blood pressure 178/76, pulse 66 at 0310. Per [name] give one time dose 10mg  IV hydralazine now. Nurse repeated verbal order to [name]. Nurse entered verbal order.

## 2015-11-18 NOTE — Progress Notes (Signed)
   11/18/15 1300  PT Visit Information  Last PT Received On 11/18/15  Assistance Needed +1  History of Present Illness s/p R TKR. PMH includes R TSA 2016  Subjective Data  Patient Stated Goal to go to rehab then home  Precautions  Precautions Fall;Knee  Required Braces or Orthoses Knee Immobilizer - Right  Knee Immobilizer - Right On at all times  Restrictions  Weight Bearing Restrictions No  Other Position/Activity Restrictions WBAT  Pain Assessment  Pain Assessment 0-10  Pain Score 2  Pain Location right knee  Pain Descriptors / Indicators Sore  Pain Intervention(s) Limited activity within patient's tolerance;Monitored during session;Premedicated before session;Repositioned  Cognition  Arousal/Alertness Awake/alert  Behavior During Therapy WFL for tasks assessed/performed  Overall Cognitive Status Within Functional Limits for tasks assessed  Area of Impairment Attention  Current Attention Level Focused (requires frequent redirection to current task)  Bed Mobility  Overal bed mobility Needs Assistance  Bed Mobility Sit to Supine  Sit to supine Min assist  General bed mobility comments cues for self assist, assist with RLE  Transfers  Overall transfer level Needs assistance  Equipment used Rolling walker (2 wheeled)  Transfers Sit to/from Stand  Sit to Stand Min assist  General transfer comment assist to rise, cues for hand placement  Ambulation/Gait  Ambulation/Gait assistance Min assist  Ambulation Distance (Feet) 11 Feet  Assistive device Rolling walker (2 wheeled)  Gait Pattern/deviations Step-to pattern;Trunk flexed;Antalgic  General Gait Details cues for sequence, posture, requires incr time  Balance  Standing balance-Leahy Scale Fair  Total Joint Exercises  Ankle Circles/Pumps AROM;Both;10 reps  Quad Sets AROM;Strengthening;Both;10 reps  Heel Slides AAROM;Right;10 reps  Hip ABduction/ADduction AAROM;Right;10 reps  Straight Leg Raises AAROM;Right;10 reps  PT  - End of Session  Equipment Utilized During Treatment Gait belt;Right knee immobilizer  Activity Tolerance Patient tolerated treatment well;Patient limited by fatigue  Patient left in bed;with call bell/phone within reach;with bed alarm set  PT - Assessment/Plan  PT Plan Current plan remains appropriate  PT Frequency (ACUTE ONLY) 7X/week  Follow Up Recommendations SNF  PT equipment Rolling walker with 5" wheels  PT Goal Progression  Progress towards PT goals Progressing toward goals  Acute Rehab PT Goals  PT Goal Formulation With patient  Time For Goal Achievement 11/25/15  Potential to Achieve Goals Good  PT Time Calculation  PT Start Time (ACUTE ONLY) 1338  PT Stop Time (ACUTE ONLY) 1355  PT Time Calculation (min) (ACUTE ONLY) 17 min  PT General Charges  $$ ACUTE PT VISIT 1 Procedure  PT Treatments  $Therapeutic Exercise 8-22 mins

## 2015-11-18 NOTE — Progress Notes (Deleted)
Patient became lightheaded and dizzy with first attempt of therapy today.  Will give blood and recheck HGb in the morning.

## 2015-11-18 NOTE — Progress Notes (Signed)
Occupational Therapy Evaluation Patient Details Name: [name] [name] MRN: [id number] DOB: 26-Jul-1940 Today's Date: 11/18/2015    History of Present Illness s/p R TKR. PMH includes R TSA 2016   Clinical Impression   PTA, pt independent with mobility and ADL. Pt will benefit form rehab at SNF to facilitate safe return home at [facility]. Will follow acutely to facilitate D/C to next venue and address established goals.     Follow Up Recommendations  SNF;Supervision/Assistance - 24 hour    Equipment Recommendations  3 in 1 bedside comode    Recommendations for Other Services       Precautions / Restrictions Precautions Precautions: Fall;Knee Required Braces or Orthoses: Knee Immobilizer - Right Knee Immobilizer - Right: On at all times Restrictions Weight Bearing Restrictions: No Other Position/Activity Restrictions: WBAT      Mobility Bed Mobility               General bed mobility comments: OOB in chair  Transfers Overall transfer level: Needs assistance Equipment used: Rolling walker (2 wheeled) Transfers: Sit to/from Stand Sit to Stand: Min guard              Balance Overall balance assessment: Needs assistance   Sitting balance-Leahy Scale: Good       Standing balance-Leahy Scale: Fair                              ADL Overall ADL's : Needs assistance/impaired     Grooming: Set up   Upper Body Bathing: Set up;Sitting   Lower Body Bathing: Sit to/from stand   Upper Body Dressing : Set up;Sitting   Lower Body Dressing: Moderate assistance;Sit to/from stand   Toilet Transfer: Min guard;Ambulation;RW   Toileting- Water quality scientist and Hygiene: Min guard;Sit to/from stand       Functional mobility during ADLs: Min guard;Rolling walker;Cueing for safety General ADL Comments: Able to stand to complete pericare with minguard. Mostly sponge bathes     Vision Additional Comments: s/p cataract surgery   Perception     Praxis       Pertinent Vitals/Pain Pain Assessment: 0-10 Pain Score: 5  Pain Location: Right knee Pain Descriptors / Indicators: Aching;Sore Pain Intervention(s): Limited activity within patient's tolerance;Monitored during session     Hand Dominance Right   Extremity/Trunk Assessment Upper Extremity Assessment Upper Extremity Assessment: Defer to OT evaluation RUE Deficits / Details: residual weakness and pain from shoulder surgery   Lower Extremity Assessment Lower Extremity Assessment: RLE deficits/detail RLE Deficits / Details: ankle WFL; anticipated post op weakness RLE: Unable to fully assess due to pain   Cervical / Trunk Assessment Cervical / Trunk Assessment: Normal   Communication Communication Communication: No difficulties   Cognition Arousal/Alertness: Awake/alert Behavior During Therapy: WFL for tasks assessed/performed Overall Cognitive Status: Within Functional Limits for tasks assessed Area of Impairment:  (talks excessively and requires redirection)                   General Comments   Pt very talkative    Exercises       Shoulder Instructions      Home Living Family/patient expects to be discharged to:: Skilled nursing facility Living Arrangements: Alone                     Bathroom Toilet: Handicapped height     Home Equipment: Cane - single point;Shower seat   Additional Comments: lives alone  Prior Functioning/Environment Level of Independence: Independent        Comments: drives; does own housework. etc    OT Diagnosis: Generalized weakness;Acute pain   OT Problem List: Decreased strength;Impaired balance (sitting and/or standing);Decreased activity tolerance;Decreased range of motion;Decreased safety awareness;Decreased knowledge of use of DME or AE;Decreased knowledge of precautions;Obesity;Pain   OT Treatment/Interventions: Self-care/ADL training;DME and/or AE instruction;Therapeutic activities;Balance training     OT Goals(Current goals can be found in the care plan section) Acute Rehab OT Goals Patient Stated Goal: to go to rehab then home OT Goal Formulation: With patient Time For Goal Achievement: 12/02/15 Potential to Achieve Goals: Good  OT Frequency: Min 2X/week   Barriers to D/C: Decreased caregiver support          Co-evaluation              End of Session Equipment Utilized During Treatment: Rolling walker;Right knee immobilizer Nurse Communication: Mobility status  Activity Tolerance: Patient tolerated treatment well Patient left: in chair;with call bell/phone within reach   Time: 0928-0955 OT Time Calculation (min): 27 min Charges:  OT General Charges $OT Visit: 1 Procedure OT Evaluation $OT Eval Low Complexity: 1 Procedure OT Treatments $Self Care/Home Management : 8-22 mins G-Codes:    Artice Holohan,HILLARY 12/17/15, 9:56 AM   Maurie Boettcher, OT/L  [phone] 2015-12-17

## 2015-11-18 NOTE — Progress Notes (Signed)
   Subjective: 1 Day Post-Op Procedure(s) (LRB): RIGHT TOTAL KNEE ARTHROPLASTY (Right) Patient reports pain as mild and moderate.   Patient seen in rounds with Dr. Theda Sers. Patient is well, but has had some minor complaints of pain in the knee, requiring pain medications We will start therapy today.  Plan is to go Skilled nursing facility after hospital stay.  Looking into [facility].  Objective: Vital signs in last 24 hours: Temp:  [97.5 F (36.4 C)-99.5 F (37.5 C)] 98.2 F (36.8 C) (07/29 0544) Pulse Rate:  [65-76] 65 (07/29 0544) Resp:  [13-19] 16 (07/29 0544) BP: (129-178)/(58-113) 147/58 (07/29 0544) SpO2:  [97 %-100 %] 100 % (07/29 0544) Weight:  [77 kg (169 lb 12.1 oz)] 77 kg (169 lb 12.1 oz) (07/28 1658)  Intake/Output from previous day:  Intake/Output Summary (Last 24 hours) at 11/18/15 0844 Last data filed at 11/18/15 0542  Gross per 24 hour  Intake             2970 ml  Output             2195 ml  Net              775 ml    Intake/Output this shift: No intake/output data recorded.  Labs:  Recent Labs  11/18/15 0410  HGB 11.7*    Recent Labs  11/18/15 0410  WBC 8.5  RBC 3.97  HCT 35.2*  PLT 152    Recent Labs  11/18/15 0410  NA 137  K 4.0  CL 106  CO2 24  BUN 13  CREATININE 0.66  GLUCOSE 143*  CALCIUM 8.9   No results for input(s): LABPT, INR in the last 72 hours.  EXAM General - Patient is Alert, Appropriate and Oriented Extremity - Neurovascular intact Sensation intact distally Dorsiflexion/Plantar flexion intact Dressing - dressing C/D/I Motor Function - intact, moving foot and toes well on exam.  Hemovac pulled without difficulty.  Past Medical History:  Diagnosis Date  . Acute kidney failure ([place])    5 years ago  . ACUTE KIDNEY FAILURE UNSPECIFIED 07/12/2009  . Allergy   . Anxiety   . Arthritis   . CAD (coronary artery disease)    Mild nonobstructive by CT  . 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 Cataract   . Chronic  kidney disease    stones  . Depression   . Diverticulosis of colon (without mention of hemorrhage)   . Fibromyalgia   . GERD (gastroesophageal reflux disease)   . Hiatal hernia   . Hyperlipidemia   . Hypertension   . Osteopenia   . PONV (postoperative nausea and vomiting)     Assessment/Plan: 1 Day Post-Op Procedure(s) (LRB): RIGHT TOTAL KNEE ARTHROPLASTY (Right) Active Problems:   S/P knee replacement   Primary osteoarthritis of right knee  Estimated body mass index is 32.07 kg/m as calculated from the following:   Height as of this encounter: 5\' 1"  (1.549 m).   Weight as of this encounter: 77 kg (169 lb 12.1 oz). Advance diet Up with therapy Discharge to SNF when ready. Probably Monday  DVT Prophylaxis - Lovenox Weight-Bearing as tolerated to right leg D/C O2 and Pulse OX and try on Room Air  Arlee Muslim, PA-C Orthopaedic Surgery 11/18/2015, 8:44 AM

## 2015-11-19 LAB — BASIC METABOLIC PANEL
ANION GAP: 4 — AB (ref 5–15)
BUN: 15 mg/dL (ref 6–20)
CHLORIDE: 105 mmol/L (ref 101–111)
CO2: 29 mmol/L (ref 22–32)
Calcium: 8.9 mg/dL (ref 8.9–10.3)
Creatinine, Ser: 0.75 mg/dL (ref 0.44–1.00)
GFR calc Af Amer: >60 mL/min (ref >60)
GLUCOSE: 143 mg/dL — AB (ref 65–99)
POTASSIUM: 3.9 mmol/L (ref 3.5–5.1)
Sodium: 138 mmol/L (ref 135–145)

## 2015-11-19 LAB — CBC
HEMATOCRIT: 34.1 % — AB (ref 36.0–46.0)
Hemoglobin: 11.1 g/dL — ABNORMAL LOW (ref 12.0–15.0)
MCH: 29.1 pg (ref 26.0–34.0)
MCHC: 32.6 g/dL (ref 30.0–36.0)
MCV: 89.3 fL (ref 78.0–100.0)
Platelets: 164 10*3/uL (ref 150–400)
RBC: 3.82 MIL/uL — AB (ref 3.87–5.11)
RDW: 14.4 % (ref 11.5–15.5)
WBC: 6.7 10*3/uL (ref 4.0–10.5)

## 2015-11-19 MED ORDER — DOCUSATE SODIUM 100 MG PO CAPS
100.0000 mg | ORAL_CAPSULE | Freq: Two times a day (BID) | ORAL | Status: DC
  Administered 2015-11-19 – 2015-11-20 (×3): 100 mg via ORAL
  Filled 2015-11-19 (×3): qty 1

## 2015-11-19 NOTE — Progress Notes (Signed)
Physical Therapy Treatment Patient Details Name: NISI DEVEGA MRN: [id number] DOB: 11/10/1940 Today's Date: 11/19/2015    History of Present Illness s/p R TKR. PMH includes R TSA 2016    PT Comments    Pt progressing well, will benefit from SNF post acute  Follow Up Recommendations  SNF     Equipment Recommendations  Rolling walker with 5" wheels    Recommendations for Other Services       Precautions / Restrictions Precautions Precautions: Fall;Knee Required Braces or Orthoses: Knee Immobilizer - Right Knee Immobilizer - Right: On at all times Restrictions Weight Bearing Restrictions: No Other Position/Activity Restrictions: WBAT    Mobility  Bed Mobility Overal bed mobility: Needs Assistance Bed Mobility: Sit to Supine       Sit to supine: Min assist   General bed mobility comments: cues for self assist, assist with RLE  Transfers Overall transfer level: Needs assistance Equipment used: Rolling walker (2 wheeled) Transfers: Sit to/from Stand Sit to Stand: Min guard         General transfer comment:  cues for hand placement and overall safety  Ambulation/Gait Ambulation/Gait assistance: Min guard;Min assist Ambulation Distance (Feet): 75 Feet Assistive device: Rolling walker (2 wheeled) Gait Pattern/deviations: Step-to pattern;Step-through pattern;Decreased weight shift to right     General Gait Details: cues for sequence, posture, requires incr time   Stairs            Wheelchair Mobility    Modified Rankin (Stroke Patients Only)       Balance                                    Cognition Arousal/Alertness: Awake/alert Behavior During Therapy: WFL for tasks assessed/performed Overall Cognitive Status: Within Functional Limits for tasks assessed Area of Impairment: Attention   Current Attention Level: Sustained           General Comments: requires redirection     Exercises Total Joint Exercises Ankle  Circles/Pumps: AROM;Both;10 reps Quad Sets: AROM;Strengthening;Both;10 reps Heel Slides: AAROM;Right;10 reps Hip ABduction/ADduction: AAROM;Right;10 reps Straight Leg Raises: AAROM;Right;10 reps    General Comments        Pertinent Vitals/Pain Pain Assessment: 0-10 Pain Score: 2  Pain Location: right knee Pain Descriptors / Indicators: Guarding Pain Intervention(s): Limited activity within patient's tolerance;Monitored during session;Premedicated before session;Repositioned;Ice applied    Home Living                      Prior Function            PT Goals (current goals can now be found in the care plan section) Acute Rehab PT Goals Patient Stated Goal: to go to rehab then home PT Goal Formulation: With patient Time For Goal Achievement: 11/25/15 Potential to Achieve Goals: Good Progress towards PT goals: Progressing toward goals    Frequency  7X/week    PT Plan Current plan remains appropriate    Co-evaluation             End of Session Equipment Utilized During Treatment: Gait belt;Right knee immobilizer Activity Tolerance: Patient tolerated treatment well Patient left: in bed;with call bell/phone within reach;with bed alarm set     Time: [id number] PT Time Calculation (min) (ACUTE ONLY): 36 min  Charges:  $Gait Training: 8-22 mins $Therapeutic Exercise: 8-22 mins  G CodesKenyon Ana 11/19/2015, 12:01 PM

## 2015-11-19 NOTE — Progress Notes (Signed)
BP noted 161/63, no s/s noted other than patient states pain to right knee; prn pain medication Oxycodone IR 10mg  given per order. Will recheck BP.  Roxan Hockey, RN 11/19/15

## 2015-11-19 NOTE — Progress Notes (Signed)
Physical Therapy Treatment Patient Details Name: [name] [name] MRN: [id number] DOB: 1941/04/16 Today's Date: 2015/12/03    History of Present Illness s/p R TKR. PMH includes R TSA 2016    PT Comments    Pt is progressing but continues to require assist with basic functional mobility, has limited assist at D/C and would benefit from SNF level therapies post acute; will continue to follow   Follow Up Recommendations  SNF     Equipment Recommendations  Rolling walker with 5" wheels    Recommendations for Other Services       Precautions / Restrictions Precautions Precautions: Fall;Knee Required Braces or Orthoses: Knee Immobilizer - Right Restrictions Other Position/Activity Restrictions: WBAT    Mobility  Bed Mobility Overal bed mobility: Needs Assistance Bed Mobility: Supine to Sit;Sit to Supine     Supine to sit: Min assist Sit to supine: Min assist   General bed mobility comments: cues for self assist, assist with RLE  Transfers Overall transfer level: Needs assistance Equipment used: Rolling walker (2 wheeled) Transfers: Sit to/from Stand Sit to Stand: Min guard         General transfer comment:  cues for hand placement and overall safety  Ambulation/Gait Ambulation/Gait assistance: Min guard Ambulation Distance (Feet): 75 Feet (x2) Assistive device: Rolling walker (2 wheeled) Gait Pattern/deviations: Step-to pattern;Antalgic;Decreased weight shift to right Gait velocity: decr   General Gait Details: cues for sequence, posture, requires incr time   Stairs            Wheelchair Mobility    Modified Rankin (Stroke Patients Only)       Balance             Standing balance-Leahy Scale: Fair                      Cognition Arousal/Alertness: Awake/alert Behavior During Therapy: WFL for tasks assessed/performed Overall Cognitive Status: Within Functional Limits for tasks assessed                      Exercises       General Comments        Pertinent Vitals/Pain Pain Assessment: 0-10 Pain Score: 3  Pain Location: right knee Pain Descriptors / Indicators: Sore Pain Intervention(s): Limited activity within patient's tolerance;Monitored during session    Home Living                      Prior Function            PT Goals (current goals can now be found in the care plan section) Acute Rehab PT Goals Patient Stated Goal: to go to rehab then home PT Goal Formulation: With patient Time For Goal Achievement: 11/25/15 Potential to Achieve Goals: Good Progress towards PT goals: Progressing toward goals    Frequency  7X/week    PT Plan Current plan remains appropriate    Co-evaluation             End of Session Equipment Utilized During Treatment: Gait belt;Right knee immobilizer Activity Tolerance: Patient tolerated treatment well Patient left: in bed;with call bell/phone within reach;with bed alarm set     Time: [id number] PT Time Calculation (min) (ACUTE ONLY): 28 min  Charges:  $Gait Training: 23-37 mins                    G Codes:      Jahniya Duzan 12/03/15, 3:36 PM

## 2015-11-19 NOTE — NC FL2 (Signed)
[place] LEVEL OF CARE SCREENING TOOL     IDENTIFICATION  Patient Name: [name] [name] Birthdate: November 30, 1940 Sex: female Admission Date (Current Location): 11/17/2015  [facility] and [place] Number:  Herbalist and Address:  [facility],  [place] [address] [place]      Provider Number: [id number]  Attending Physician Name and Address:  Sydnee Cabal, MD  Relative Name and Phone Number:       Current Level of Care: Hospital Recommended Level of Care: [place] Prior Approval Number:    Date Approved/Denied:   PASRR Number:    Discharge Plan: SNF    Current Diagnoses: Patient Active Problem List   Diagnosis Date Noted  . S/P knee replacement 11/17/2015  . Primary osteoarthritis of right knee 11/17/2015  . S/P shoulder replacement 08/12/2014  . Primary osteoarthritis involving multiple joints 03/21/2014  . CKD (chronic kidney disease) 12/16/2013  . Medication management 09/17/2013  . Visit for preventive health examination 06/16/2013  . Hyperglycemia 06/16/2013  . Pre-operative clearance 08/17/2012  . Agatston coronary artery calcium score between 200 and 399 04/13/2012  . Breast lump on left side at 9 o'clock position 02/12/2012  . Skin lesion 02/12/2012  . Dizziness - light-headed 11/14/2011  . Vitamin B 12 deficiency 11/14/2011  . Gynecomastia 07/18/2011  . Throat soreness 07/18/2011  . Fatigue 06/01/2011  . Chest tightness or pressure 06/01/2011  . Sleep difficulties 03/24/2011  . Intentional underdosing of medication regimen by patient due to financial hardship 03/24/2011  . ABDOMINAL BLOATING 10/10/2009  . LUMP OR MASS IN BREAST 08/28/2009  . ANEMIA 07/26/2009  . ABDOMINAL PAIN OTHER SPECIFIED SITE 07/12/2009  . 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/2009  . RENAL CALCULUS, HX OF 01/04/2009  . IRON DEFICIENCY 11/15/2008  . FIBROMYALGIA 11/15/2008  . VITAMIN D DEFICIENCY 10/10/2008  . ANEMIA-B12 DEFICIENCY  05/10/2008  . SOMATIZATION DISORDER 05/05/2008  . ADJ DISORDER WITH MIXED ANXIETY & DEPRESSED MOOD 05/05/2008  . DYSPHAGIA UNSPECIFIED 05/02/2008  . Personal history of other diseases of digestive system 05/02/2008  . VARICOSE VEINS, LOWER EXTREMITIES 04/12/2008  . IBS 04/12/2008  . OSTEOPENIA 04/12/2008  . Adjustment disorder with depressed mood 02/16/2008  . CHEST PAIN 02/16/2008  . Hyperlipidemia 12/24/2006  . ANXIETY 12/24/2006  . Essential hypertension 12/24/2006  . GERD 12/24/2006  . HIATAL HERNIA 12/24/2006  . Osteoarthritis 12/24/2006    Orientation RESPIRATION BLADDER Height & Weight     Self, Time, Situation, Place  Normal Continent Weight: 169 lb 12.1 oz (77 kg) Height:  5\' 1"  (154.9 cm)  BEHAVIORAL SYMPTOMS/MOOD NEUROLOGICAL BOWEL NUTRITION STATUS  Other (Comment) (n/a)  (n/a) Continent Diet (Low sodium heart healthy)  AMBULATORY STATUS COMMUNICATION OF NEEDS Skin   Limited Assist Verbally Surgical wounds                       Personal Care Assistance Level of Assistance  Bathing, Dressing Bathing Assistance: Maximum assistance Feeding assistance: Independent Dressing Assistance: Maximum assistance     Functional Limitations Info  Sight, Hearing, Speech Sight Info: Impaired Hearing Info: Adequate Speech Info: Adequate    SPECIAL CARE FACTORS FREQUENCY  PT (By licensed PT)                    Contractures Contractures Info: Not present    Additional Factors Info  Code Status, Allergies Code Status Info: Full code Allergies Info: Aspirin, Lactose Intolerance (Gi), Meloxicam, Sulfamethoxazole, Tylenol (Acetaminophen)  Current Medications (11/19/2015):  This is the current hospital active medication list Current Facility-Administered Medications  Medication Dose Route Frequency Provider Last Rate Last Dose  . albuterol (PROVENTIL) (2.5 MG/3ML) 0.083% nebulizer solution 3 mL  3 mL Inhalation Q6H PRN Larri Yehle L Aiden Helzer, PA-C      .  alum & mag hydroxide-simeth (MAALOX/MYLANTA) 200-200-20 MG/5ML suspension 30 mL  30 mL Oral Q4H PRN Lijah Bourque L Tam Delisle, PA-C   30 mL at 11/19/15 0010  . amLODipine (NORVASC) tablet 5 mg  5 mg Oral Daily Han Vejar L Ayumi Wangerin, PA-C   5 mg at 11/18/15 1028  . docusate sodium (COLACE) capsule 100 mg  100 mg Oral BID Arlee Muslim, PA-C      . DULoxetine (CYMBALTA) DR capsule 60 mg  60 mg Oral BID Kellsie Grindle L Channon Brougher, PA-C   60 mg at 11/18/15 2233  . enoxaparin (LOVENOX) injection 30 mg  30 mg Subcutaneous Q12H Knight Oelkers L Martese Vanatta, PA-C   30 mg at 11/19/15 0823  . ferrous sulfate tablet 325 mg  325 mg Oral TID PC Moselle Rister L Idabell Picking, PA-C   325 mg at 11/19/15 0823  . HYDROmorphone (DILAUDID) injection 1 mg  1 mg Intravenous Q2H PRN Demetrius Barrell L Kohl Polinsky, PA-C   1 mg at 11/17/15 1805  . menthol-cetylpyridinium (CEPACOL) lozenge 3 mg  1 lozenge Oral PRN Jaston Havens L Anzleigh Slaven, PA-C       Or  . phenol (CHLORASEPTIC) mouth spray 1 spray  1 spray Mouth/Throat PRN Srinidhi Landers L Sondos Wolfman, PA-C      . methocarbamol (ROBAXIN) tablet 500 mg  500 mg Oral Q6H PRN Japneet Staggs L Cala Kruckenberg, PA-C       Or  . methocarbamol (ROBAXIN) 500 mg in dextrose 5 % 50 mL IVPB  500 mg Intravenous Q6H PRN Harjot Dibello L Melquiades Kovar, PA-C   500 mg at 11/17/15 1639  . metoCLOPramide (REGLAN) tablet 5-10 mg  5-10 mg Oral Q8H PRN Haydan Wedig L Otillia Cordone, PA-C       Or  . metoCLOPramide (REGLAN) injection 5-10 mg  5-10 mg Intravenous Q8H PRN Melda Mermelstein L Kamilia Carollo, PA-C      . metoprolol tartrate (LOPRESSOR) tablet 25 mg  25 mg Oral BID Dejean Tribby L Wolfe Camarena, PA-C   25 mg at 11/18/15 2233  . omeprazole (PRILOSEC) capsule 20 mg  20 mg Oral BID Arlee Muslim, PA-C   20 mg at 11/18/15 2233  . ondansetron (ZOFRAN) tablet 4 mg  4 mg Oral Q6H PRN Tresten Pantoja L Race Latour, PA-C       Or  . ondansetron (ZOFRAN) injection 4 mg  4 mg Intravenous Q6H PRN Jevonte Clanton L Naysha Sholl, PA-C      . oxyCODONE (Oxy IR/ROXICODONE) immediate release tablet 5-10 mg  5-10 mg Oral Q3H PRN Keanna Tugwell L Tyquasia Pant, PA-C   10 mg at  11/19/15 0532  . rosuvastatin (CRESTOR) tablet 40 mg  40 mg Oral q1800 Jera Headings L Rochanda Harpham, PA-C   40 mg at 11/18/15 1721  . saccharomyces boulardii (FLORASTOR) capsule 250 mg  250 mg Oral BID Sydnee Cabal, MD   250 mg at 11/18/15 2233  . traMADol (ULTRAM) tablet 50-100 mg  50-100 mg Oral QHS PRN Kyri Dai L Dilana Mcphie, PA-C      . vitamin B-12 (CYANOCOBALAMIN) tablet 250 mcg  250 mcg Oral Daily Sydnee Cabal, MD   250 mcg at 11/18/15 1029     Discharge Medications: Please see discharge summary for a list of discharge medications.  Relevant Imaging Results:  Relevant Lab Results:   Additional Information SSN: [id number]  Benay Pike [place], [place]

## 2015-11-19 NOTE — Clinical Social Work Placement (Signed)
   CLINICAL SOCIAL WORK PLACEMENT  NOTE  Date:  11/19/2015  Patient Details  Name: [name] [name] MRN: [id number] Date of Birth: [birth year]  Clinical Social Work is seeking post-discharge placement for this patient at the [place] level of care (*CSW will initial, date and re-position this form in  chart as items are completed):  Yes   Patient/family provided with [place] Work Department's list of facilities offering this level of care within the geographic area requested by the patient (or if unable, by the patient's family).  Yes   Patient/family informed of their freedom to choose among providers that offer the needed level of care, that participate in Medicare, Medicaid or managed care program needed by the patient, have an available bed and are willing to accept the patient.  Yes   Patient/family informed of [facility]'s ownership interest in [facility] and [facility], as well as of the fact that they are under no obligation to receive care at these facilities.  PASRR submitted to EDS on       PASRR number received on       Existing PASRR number confirmed on       FL2 transmitted to all facilities in geographic area requested by pt/family on 11/19/15     FL2 transmitted to all facilities within larger geographic area on       Patient informed that his/her managed care company has contracts with or will negotiate with certain facilities, including the following:            Patient/family informed of bed offers received.  Patient chooses bed at       Physician recommends and patient chooses bed at      Patient to be transferred to   on  .  Patient to be transferred to facility by       Patient family notified on   of transfer.  Name of family member notified:        PHYSICIAN       Additional Comment:    _______________________________________________ Salome Arnt, [place] 11/19/2015, 10:06 AM [phone]

## 2015-11-19 NOTE — Progress Notes (Signed)
Patient BP re-check 141/66 right arm. Pain improved. Will c/t monitor. Roxan Hockey, RN 11/19/15

## 2015-11-19 NOTE — Progress Notes (Signed)
   Subjective: 2 Days Post-Op Procedure(s) (LRB): RIGHT TOTAL KNEE ARTHROPLASTY (Right) Patient reports pain as mild.   Patient seen in rounds with Dr. Theda Sers. Patient is well, but has had some minor complaints of pain in the knee, requiring pain medications Plan is to go Skilled nursing facility after hospital stay.  Plan for [facility] place tomorrow.  Objective: Vital signs in last 24 hours: Temp:  [98 F (36.7 C)-99 F (37.2 C)] 99 F (37.2 C) (07/30 0427) Pulse Rate:  [65-73] 65 (07/30 0427) Resp:  [16-18] 18 (07/30 0427) BP: (136-161)/(60-68) 141/66 (07/30 0613) SpO2:  [99 %-100 %] 99 % (07/30 0427)  Intake/Output from previous day:  Intake/Output Summary (Last 24 hours) at 11/19/15 0748 Last data filed at 11/19/15 0520  Gross per 24 hour  Intake             1020 ml  Output             1000 ml  Net               20 ml    Intake/Output this shift: No intake/output data recorded.  Labs:  Recent Labs  11/18/15 0410 11/19/15 0422  HGB 11.7* 11.1*    Recent Labs  11/18/15 0410 11/19/15 0422  WBC 8.5 6.7  RBC 3.97 3.82*  HCT 35.2* 34.1*  PLT 152 164    Recent Labs  11/18/15 0410 11/19/15 0422  NA 137 138  K 4.0 3.9  CL 106 105  CO2 24 29  BUN 13 15  CREATININE 0.66 0.75  GLUCOSE 143* 143*  CALCIUM 8.9 8.9   No results for input(s): LABPT, INR in the last 72 hours.  EXAM General - Patient is Alert, Appropriate and Oriented Extremity - Neurovascular intact Sensation intact distally Dorsiflexion/Plantar flexion intact Dressing/Incision - clean, dry, no drainage Motor Function - intact, moving foot and toes well on exam.   Past Medical History:  Diagnosis Date  . Acute kidney failure ([place])    5 years ago  . ACUTE KIDNEY FAILURE UNSPECIFIED 07/12/2009  . Allergy   . Anxiety   . Arthritis   . CAD (coronary artery disease)    Mild nonobstructive by CT  . 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 Cataract   . Chronic kidney disease    stones  .  Depression   . Diverticulosis of colon (without mention of hemorrhage)   . Fibromyalgia   . GERD (gastroesophageal reflux disease)   . Hiatal hernia   . Hyperlipidemia   . Hypertension   . Osteopenia   . PONV (postoperative nausea and vomiting)     Assessment/Plan: 2 Days Post-Op Procedure(s) (LRB): RIGHT TOTAL KNEE ARTHROPLASTY (Right) Active Problems:   S/P knee replacement   Primary osteoarthritis of right knee  Estimated body mass index is 32.07 kg/m as calculated from the following:   Height as of this encounter: 5\' 1"  (1.549 m).   Weight as of this encounter: 77 kg (169 lb 12.1 oz). Up with therapy Plan for discharge tomorrow Discharge to SNF  DVT Prophylaxis - Lovenox Weight-Bearing as tolerated to right leg  Arlee Muslim, PA-C Orthopaedic Surgery 11/19/2015, 7:48 AM

## 2015-11-19 NOTE — Clinical Social Work Note (Signed)
Clinical Social Work Assessment  Patient Details  Name: [name] [name] MRN: [id number] Date of Birth: [birth year]  Date of referral:  11/19/15               Reason for consult:  Discharge Planning                Permission sought to share information with:    Permission granted to share information::     Name::        Agency::     Relationship::     Contact Information:     Housing/Transportation Living arrangements for the past 2 months:  Single Family Home Source of Information:  Patient Patient Interpreter Needed:  None Criminal Activity/Legal Involvement Pertinent to Current Situation/Hospitalization:  No - Comment as needed Significant Relationships:  Adult Children, Siblings Lives with:  Self Do you feel safe going back to the place where you live?  Yes (after rehab) Need for family participation in patient care:  No (Coment)  Care giving concerns:  S/P right total knee. Lives alone.    Social Worker assessment / plan:  CSW met with pt at bedside. Pt alert and oriented and reports she lives alone. Her sister and daughter live locally and are best supports. Pt is s/p right total knee. Anticipating d/c to SNF and PT recommends this as well. CSW discussed placement process and provided SNF list. She states she was at [facility] last year after shoulder surgery and would prefer to return there for rehab. CSW will initiate bed search.   Employment status:  Retired Nurse, adult PT Recommendations:  [place] / Referral to community resources:  [place]  Patient/Family's Response to care:  Pt requests [employer] or [facility] if available, but agreeable to SNF bed search in [place].   Patient/Family's Understanding of and Emotional Response to Diagnosis, Current Treatment, and Prognosis:  Pt had left total knee about 8 years ago, so is aware of recovery process. She is agreeable to short term SNF prior to  return home.   Emotional Assessment Appearance:  Appears stated age Attitude/Demeanor/Rapport:  Other (Cooperative) Affect (typically observed):  Appropriate Orientation:  Oriented to Self, Oriented to Place, Oriented to Situation, Oriented to  Time Alcohol / Substance use:  Not Applicable Psych involvement (Current and /or in the community):     Discharge Needs  Concerns to be addressed:  Discharge Planning Concerns Readmission within the last 30 days:  No Current discharge risk:  Lives alone Barriers to Discharge:  No Barriers Identified   Salome Arnt, [place] 11/19/2015, 10:08 AM [phone]

## 2015-11-20 ENCOUNTER — Other Ambulatory Visit: Payer: Self-pay

## 2015-11-20 DIAGNOSIS — Z96651 Presence of right artificial knee joint: Secondary | ICD-10-CM | POA: Diagnosis not present

## 2015-11-20 DIAGNOSIS — Z79899 Other long term (current) drug therapy: Secondary | ICD-10-CM | POA: Diagnosis not present

## 2015-11-20 DIAGNOSIS — M6281 Muscle weakness (generalized): Secondary | ICD-10-CM | POA: Diagnosis not present

## 2015-11-20 DIAGNOSIS — N189 Chronic kidney disease, unspecified: Secondary | ICD-10-CM | POA: Diagnosis not present

## 2015-11-20 DIAGNOSIS — E43 Unspecified severe protein-calorie malnutrition: Secondary | ICD-10-CM | POA: Diagnosis not present

## 2015-11-20 DIAGNOSIS — M1711 Unilateral primary osteoarthritis, right knee: Secondary | ICD-10-CM | POA: Diagnosis not present

## 2015-11-20 DIAGNOSIS — R262 Difficulty in walking, not elsewhere classified: Secondary | ICD-10-CM | POA: Diagnosis not present

## 2015-11-20 DIAGNOSIS — I10 Essential (primary) hypertension: Secondary | ICD-10-CM | POA: Diagnosis not present

## 2015-11-20 DIAGNOSIS — E785 Hyperlipidemia, unspecified: Secondary | ICD-10-CM | POA: Diagnosis not present

## 2015-11-20 DIAGNOSIS — K219 Gastro-esophageal reflux disease without esophagitis: Secondary | ICD-10-CM | POA: Diagnosis not present

## 2015-11-20 DIAGNOSIS — R2681 Unsteadiness on feet: Secondary | ICD-10-CM | POA: Diagnosis not present

## 2015-11-20 DIAGNOSIS — D696 Thrombocytopenia, unspecified: Secondary | ICD-10-CM | POA: Diagnosis not present

## 2015-11-20 DIAGNOSIS — Z471 Aftercare following joint replacement surgery: Secondary | ICD-10-CM | POA: Diagnosis not present

## 2015-11-20 DIAGNOSIS — D62 Acute posthemorrhagic anemia: Secondary | ICD-10-CM | POA: Diagnosis not present

## 2015-11-20 LAB — CBC
HCT: 34 % — ABNORMAL LOW (ref 36.0–46.0)
Hemoglobin: 11 g/dL — ABNORMAL LOW (ref 12.0–15.0)
MCH: 29.3 pg (ref 26.0–34.0)
MCHC: 32.4 g/dL (ref 30.0–36.0)
MCV: 90.4 fL (ref 78.0–100.0)
PLATELETS: 140 10*3/uL — AB (ref 150–400)
RBC: 3.76 MIL/uL — ABNORMAL LOW (ref 3.87–5.11)
RDW: 14.3 % (ref 11.5–15.5)
WBC: 5.8 10*3/uL (ref 4.0–10.5)

## 2015-11-20 MED ORDER — RIVAROXABAN 10 MG PO TABS: 10.0000 mg | ORAL_TABLET | Freq: Every day | ORAL | 0 refills | Status: DC

## 2015-11-20 MED ORDER — HYDROMORPHONE HCL 2 MG PO TABS: 2.0000 mg | ORAL_TABLET | ORAL | 0 refills | Status: DC | PRN

## 2015-11-20 MED ORDER — TRAMADOL HCL 50 MG PO TABS: 50.0000 mg | ORAL_TABLET | Freq: Every evening | ORAL | 5 refills | Status: DC | PRN

## 2015-11-20 NOTE — Telephone Encounter (Signed)
Rx faxed to [facility] @ [phone], phone number [phone]  

## 2015-11-20 NOTE — Discharge Summary (Signed)
Physician Discharge Summary  Patient ID: [name] [name] MRN: [id number] DOB/AGE: 1940/12/15 75 y.o.  Admit date: 11/17/2015 Discharge date: 11/20/2015  Admission Diagnoses: Right knee OA  Discharge Diagnoses:  Active Problems:   S/P knee replacement   Primary osteoarthritis of right knee   Discharged Condition: good  Hospital Course:  VIDEL ORPILLA is a 75 y.o. who was admitted to [facility]. They were brought to the operating room on 11/17/2015 and underwent Procedure(s): RIGHT TOTAL KNEE ARTHROPLASTY.  Patient tolerated the procedure well and was later transferred to the recovery room and then to the orthopaedic floor for postoperative care.  They were given PO and IV analgesics for pain control following their surgery.  They were given 24 hours of postoperative antibiotics of  Anti-infectives    Start     Dose/Rate Route Frequency Ordered Stop   11/17/15 2000  ceFAZolin (ANCEF) IVPB 2g/100 mL premix     2 g 200 mL/hr over 30 Minutes Intravenous Every 6 hours 11/17/15 1718 11/18/15 0212   11/17/15 1122  ceFAZolin (ANCEF) IVPB 2g/100 mL premix     2 g 200 mL/hr over 30 Minutes Intravenous On call to O.R. 11/17/15 1122 11/17/15 1350     and started on DVT prophylaxis in the form of lovenox.   PT and OT were ordered for total joint protocol.  Discharge planning consulted to help with postop disposition and equipment needs.  Patient had a good night on the evening of surgery and started to get up OOB with therapy on day one.  Hemovac drain was pulled without difficulty.  Continued to work with therapy into day two.  Dressing was with normal limits.  The patient had progressed with therapy and meeting their goals. Patient was seen in rounds and was ready to go home.  Consults: n/a  Significant Diagnostic Studies: routine  Treatments: routine Discharge Exam: Blood pressure (!) 156/66, pulse 74, temperature 98.9 F (37.2 C), temperature source Oral, resp. rate 16, height 5\' 1"   (1.549 m), weight 77 kg (169 lb 12.1 oz), SpO2 98 %. Well nourished. Alert and oriented x3. RRR, Lungs clear, BS x4. Abdomen soft and non tender. Right Calf soft and non tender. Right knee dressing C/D/I. No DVT signs. Compartment soft. No signs of infection.  Right LE grossly neurovascular intact.Well nourished.   Discharge Instructions    Call MD / Call 911    Complete by:  As directed   If you experience chest pain or shortness of breath, CALL 911 and be transported to the hospital emergency room.  If you develope a fever above 101 F, pus (white drainage) or increased drainage or redness at the wound, or calf pain, call your surgeon's office.   Call MD / Call 911    Complete by:  As directed   If you experience chest pain or shortness of breath, CALL 911 and be transported to the hospital emergency room.  If you develope a fever above 101 F, pus (white drainage) or increased drainage or redness at the wound, or calf pain, call your surgeon's office.   Constipation Prevention    Complete by:  As directed   Drink plenty of fluids.  Prune juice may be helpful.  You may use a stool softener, such as Colace (over the counter) 100 mg twice a day.  Use MiraLax (over the counter) for constipation as needed.   Constipation Prevention    Complete by:  As directed   Drink plenty of fluids.  Prune juice may be helpful.  You may use a stool softener, such as Colace (over the counter) 100 mg twice a day.  Use MiraLax (over the counter) for constipation as needed.   Diet - low sodium heart healthy    Complete by:  As directed   Diet - low sodium heart healthy    Complete by:  As directed   Discharge instructions    Complete by:  As directed   INSTRUCTIONS AFTER JOINT REPLACEMENT   Remove items at home which could result in a fall. This includes throw rugs or furniture in walking pathways ICE to the affected joint every three hours while awake for 30 minutes at a time, for at least the first 3-5 days, and then  as needed for pain and swelling.  Continue to use ice for pain and swelling. You may notice swelling that will progress down to the foot and ankle.  This is normal after surgery.  Elevate your leg when you are not up walking on it.   Continue to use the breathing machine you got in the hospital (incentive spirometer) which will help keep your temperature down.  It is common for your temperature to cycle up and down following surgery, especially at night when you are not up moving around and exerting yourself.  The breathing machine keeps your lungs expanded and your temperature down.   DIET:  As you were doing prior to hospitalization, we recommend a well-balanced diet.  DRESSING / WOUND CARE / SHOWERING  Keep the surgical dressing until follow up.  The dressing is water proof, so you can shower without any extra covering.  IF THE DRESSING FALLS OFF or the wound gets wet inside, change the dressing with sterile gauze.  Please use good hand washing techniques before changing the dressing.  Do not use any lotions or creams on the incision until instructed by your surgeon.    ACTIVITY  Increase activity slowly as tolerated, but follow the weight bearing instructions below.   No driving for 6 weeks or until further direction given by your physician.  You cannot drive while taking narcotics.  No lifting or carrying greater than 10 lbs. until further directed by your surgeon. Avoid periods of inactivity such as sitting longer than an hour when not asleep. This helps prevent blood clots.  You may return to work once you are authorized by your doctor.     WEIGHT BEARING   Weight bearing as tolerated with assist device (walker, cane, etc) as directed, use it as long as suggested by your surgeon or therapist, typically at least 4-6 weeks.   EXERCISES  Results after joint replacement surgery are often greatly improved when you follow the exercise, range of motion and muscle strengthening exercises  prescribed by your doctor. Safety measures are also important to protect the joint from further injury. Any time any of these exercises cause you to have increased pain or swelling, decrease what you are doing until you are comfortable again and then slowly increase them. If you have problems or questions, call your caregiver or physical therapist for advice.   Rehabilitation is important following a joint replacement. After just a few days of immobilization, the muscles of the leg can become weakened and shrink (atrophy).  These exercises are designed to build up the tone and strength of the thigh and leg muscles and to improve motion. Often times heat used for twenty to thirty minutes before working out will loosen up your tissues  and help with improving the range of motion but do not use heat for the first two weeks following surgery (sometimes heat can increase post-operative swelling).   These exercises can be done on a training (exercise) mat, on the floor, on a table or on a bed. Use whatever works the best and is most comfortable for you.    Use music or television while you are exercising so that the exercises are a pleasant break in your day. This will make your life better with the exercises acting as a break in your routine that you can look forward to.   Perform all exercises about fifteen times, three times per day or as directed.  You should exercise both the operative leg and the other leg as well.   Exercises include:   Quad Sets - Tighten up the muscle on the front of the thigh (Quad) and hold for 5-10 seconds.   Straight Leg Raises - With your knee straight (if you were given a brace, keep it on), lift the leg to 60 degrees, hold for 3 seconds, and slowly lower the leg.  Perform this exercise against resistance later as your leg gets stronger.  Leg Slides: Lying on your back, slowly slide your foot toward your buttocks, bending your knee up off the floor (only go as far as is  comfortable). Then slowly slide your foot back down until your leg is flat on the floor again.  Angel Wings: Lying on your back spread your legs to the side as far apart as you can without causing discomfort.  Hamstring Strength:  Lying on your back, push your heel against the floor with your leg straight by tightening up the muscles of your buttocks.  Repeat, but this time bend your knee to a comfortable angle, and push your heel against the floor.  You may put a pillow under the heel to make it more comfortable if necessary.   A rehabilitation program following joint replacement surgery can speed recovery and prevent re-injury in the future due to weakened muscles. Contact your doctor or a physical therapist for more information on knee rehabilitation.    CONSTIPATION  Constipation is defined medically as fewer than three stools per week and severe constipation as less than one stool per week.  Even if you have a regular bowel pattern at home, your normal regimen is likely to be disrupted due to multiple reasons following surgery.  Combination of anesthesia, postoperative narcotics, change in appetite and fluid intake all can affect your bowels.   YOU MUST use at least one of the following options; they are listed in order of increasing strength to get the job done.  They are all available over the counter, and you may need to use some, POSSIBLY even all of these options:    Drink plenty of fluids (prune juice may be helpful) and high fiber foods Colace 100 mg by mouth twice a day  Senokot for constipation as directed and as needed Dulcolax (bisacodyl), take with full glass of water  Miralax (polyethylene glycol) once or twice a day as needed.  If you have tried all these things and are unable to have a bowel movement in the first 3-4 days after surgery call either your surgeon or your primary doctor.    If you experience loose stools or diarrhea, hold the medications until you stool forms back  up.  If your symptoms do not get better within 1 week or if they get worse, check  with your doctor.  If you experience "the worst abdominal pain ever" or develop nausea or vomiting, please contact the office immediately for further recommendations for treatment.   ITCHING:  If you experience itching with your medications, try taking only a single pain pill, or even half a pain pill at a time.  You can also use Benadryl over the counter for itching or also to help with sleep.   TED HOSE STOCKINGS:  Use stockings on both legs until for at least 2 weeks or as directed by physician office. They may be removed at night for sleeping.  MEDICATIONS:  See your medication summary on the "After Visit Summary" that nursing will review with you.  You may have some home medications which will be placed on hold until you complete the course of blood thinner medication.  It is important for you to complete the blood thinner medication as prescribed.  PRECAUTIONS:  If you experience chest pain or shortness of breath - call 911 immediately for transfer to the hospital emergency department.   If you develop a fever greater that 101 F, purulent drainage from wound, increased redness or drainage from wound, foul odor from the wound/dressing, or calf pain - CONTACT YOUR SURGEON.                                                   FOLLOW-UP APPOINTMENTS:  If you do not already have a post-op appointment, please call the office for an appointment to be seen by your surgeon.  Guidelines for how soon to be seen are listed in your "After Visit Summary", but are typically between 1-4 weeks after surgery.  OTHER INSTRUCTIONS:   Knee Replacement:  Do not place pillow under knee, focus on keeping the knee straight while resting. CPM instructions: 0-90 degrees, 2 hours in the morning, 2 hours in the afternoon, and 2 hours in the evening. Place foam block, curve side up under heel at all times except when in CPM or when walking.  DO  NOT modify, tear, cut, or change the foam block in any way.  MAKE SURE YOU:  Understand these instructions.  Get help right away if you are not doing well or get worse.    Thank you for letting us be a part of your medical care team.  It is a privilege we respect greatly.  We hope these instructions will help you stay on track for a fast and full recovery!   Discharge instructions    Complete by:  As directed   INSTRUCTIONS AFTER JOINT REPLACEMENT   Remove items at home which could result in a fall. This includes throw rugs or furniture in walking pathways ICE to the affected joint every three hours while awake for 30 minutes at a time, for at least the first 3-5 days, and then as needed for pain and swelling.  Continue to use ice for pain and swelling. You may notice swelling that will progress down to the foot and ankle.  This is normal after surgery.  Elevate your leg when you are not up walking on it.   Continue to use the breathing machine you got in the hospital (incentive spirometer) which will help keep your temperature down.  It is common for your temperature to cycle up and down following surgery, especially at night when you are  not up moving around and exerting yourself.  The breathing machine keeps your lungs expanded and your temperature down.   DIET:  As you were doing prior to hospitalization, we recommend a well-balanced diet.  DRESSING / WOUND CARE / SHOWERING  Keep the surgical dressing until follow up.  The dressing is water proof, so you can shower without any extra covering.  IF THE DRESSING FALLS OFF or the wound gets wet inside, change the dressing with sterile gauze.  Please use good hand washing techniques before changing the dressing.  Do not use any lotions or creams on the incision until instructed by your surgeon.    ACTIVITY  Increase activity slowly as tolerated, but follow the weight bearing instructions below.   No driving for 6 weeks or until further  direction given by your physician.  You cannot drive while taking narcotics.  No lifting or carrying greater than 10 lbs. until further directed by your surgeon. Avoid periods of inactivity such as sitting longer than an hour when not asleep. This helps prevent blood clots.  You may return to work once you are authorized by your doctor.     WEIGHT BEARING   Weight bearing as tolerated with assist device (walker, cane, etc) as directed, use it as long as suggested by your surgeon or therapist, typically at least 4-6 weeks.   EXERCISES  Results after joint replacement surgery are often greatly improved when you follow the exercise, range of motion and muscle strengthening exercises prescribed by your doctor. Safety measures are also important to protect the joint from further injury. Any time any of these exercises cause you to have increased pain or swelling, decrease what you are doing until you are comfortable again and then slowly increase them. If you have problems or questions, call your caregiver or physical therapist for advice.   Rehabilitation is important following a joint replacement. After just a few days of immobilization, the muscles of the leg can become weakened and shrink (atrophy).  These exercises are designed to build up the tone and strength of the thigh and leg muscles and to improve motion. Often times heat used for twenty to thirty minutes before working out will loosen up your tissues and help with improving the range of motion but do not use heat for the first two weeks following surgery (sometimes heat can increase post-operative swelling).   These exercises can be done on a training (exercise) mat, on the floor, on a table or on a bed. Use whatever works the best and is most comfortable for you.    Use music or television while you are exercising so that the exercises are a pleasant break in your day. This will make your life better with the exercises acting as a break in  your routine that you can look forward to.   Perform all exercises about fifteen times, three times per day or as directed.  You should exercise both the operative leg and the other leg as well.   Exercises include:   Quad Sets - Tighten up the muscle on the front of the thigh (Quad) and hold for 5-10 seconds.   Straight Leg Raises - With your knee straight (if you were given a brace, keep it on), lift the leg to 60 degrees, hold for 3 seconds, and slowly lower the leg.  Perform this exercise against resistance later as your leg gets stronger.  Leg Slides: Lying on your back, slowly slide your foot toward your buttocks,  bending your knee up off the floor (only go as far as is comfortable). Then slowly slide your foot back down until your leg is flat on the floor again.  Angel Wings: Lying on your back spread your legs to the side as far apart as you can without causing discomfort.  Hamstring Strength:  Lying on your back, push your heel against the floor with your leg straight by tightening up the muscles of your buttocks.  Repeat, but this time bend your knee to a comfortable angle, and push your heel against the floor.  You may put a pillow under the heel to make it more comfortable if necessary.   A rehabilitation program following joint replacement surgery can speed recovery and prevent re-injury in the future due to weakened muscles. Contact your doctor or a physical therapist for more information on knee rehabilitation.    CONSTIPATION  Constipation is defined medically as fewer than three stools per week and severe constipation as less than one stool per week.  Even if you have a regular bowel pattern at home, your normal regimen is likely to be disrupted due to multiple reasons following surgery.  Combination of anesthesia, postoperative narcotics, change in appetite and fluid intake all can affect your bowels.   YOU MUST use at least one of the following options; they are listed in order of  increasing strength to get the job done.  They are all available over the counter, and you may need to use some, POSSIBLY even all of these options:    Drink plenty of fluids (prune juice may be helpful) and high fiber foods Colace 100 mg by mouth twice a day  Senokot for constipation as directed and as needed Dulcolax (bisacodyl), take with full glass of water  Miralax (polyethylene glycol) once or twice a day as needed.  If you have tried all these things and are unable to have a bowel movement in the first 3-4 days after surgery call either your surgeon or your primary doctor.    If you experience loose stools or diarrhea, hold the medications until you stool forms back up.  If your symptoms do not get better within 1 week or if they get worse, check with your doctor.  If you experience "the worst abdominal pain ever" or develop nausea or vomiting, please contact the office immediately for further recommendations for treatment.   ITCHING:  If you experience itching with your medications, try taking only a single pain pill, or even half a pain pill at a time.  You can also use Benadryl over the counter for itching or also to help with sleep.   TED HOSE STOCKINGS:  Use stockings on both legs until for at least 2 weeks or as directed by physician office. They may be removed at night for sleeping.  MEDICATIONS:  See your medication summary on the "After Visit Summary" that nursing will review with you.  You may have some home medications which will be placed on hold until you complete the course of blood thinner medication.  It is important for you to complete the blood thinner medication as prescribed.  PRECAUTIONS:  If you experience chest pain or shortness of breath - call 911 immediately for transfer to the hospital emergency department.   If you develop a fever greater that 101 F, purulent drainage from wound, increased redness or drainage from wound, foul odor from the wound/dressing, or  calf pain - CONTACT YOUR SURGEON.  FOLLOW-UP APPOINTMENTS:  If you do not already have a post-op appointment, please call the office for an appointment to be seen by your surgeon.  Guidelines for how soon to be seen are listed in your "After Visit Summary", but are typically between 1-4 weeks after surgery.  OTHER INSTRUCTIONS:   Knee Replacement:  Do not place pillow under knee, focus on keeping the knee straight while resting. CPM instructions: 0-90 degrees, 2 hours in the morning, 2 hours in the afternoon, and 2 hours in the evening. Place foam block, curve side up under heel at all times except when in CPM or when walking.  DO NOT modify, tear, cut, or change the foam block in any way.  MAKE SURE YOU:  Understand these instructions.  Get help right away if you are not doing well or get worse.    Thank you for letting us be a part of your medical care team.  It is a privilege we respect greatly.  We hope these instructions will help you stay on track for a fast and full recovery!   Increase activity slowly as tolerated    Complete by:  As directed   Increase activity slowly as tolerated    Complete by:  As directed       Medication List    TAKE these medications   albuterol 108 (90 Base) MCG/ACT inhaler Commonly known as:  PROVENTIL HFA;VENTOLIN HFA Inhale 2 puffs into the lungs every 6 (six) hours as needed for wheezing or shortness of breath.   ALIGN PO Take 1 tablet by mouth daily.   amLODipine 5 MG tablet Commonly known as:  NORVASC TAKE 1 TABLET BY MOUTH EVERY DAY What changed:  See the new instructions.   B-12 PO Take 1 tablet by mouth daily. Reported on 05/29/2015   baclofen 10 MG tablet Commonly known as:  LIORESAL Take 1 tablet (10 mg total) by mouth 3 (three) times daily as needed for muscle spasms.   CRESTOR 40 MG tablet Generic drug:  rosuvastatin TAKE 1 TABLET BY MOUTH EVERY DAY What changed:  See the new  instructions.   DULoxetine 60 MG capsule Commonly known as:  CYMBALTA TAKE ONE CAPSULE BY MOUTH TWICE A DAY What changed:  See the new instructions.   HYDROmorphone 2 MG tablet Commonly known as:  DILAUDID Take 1-2 tablets (2-4 mg total) by mouth every 4 (four) hours as needed for severe pain.   metoprolol 50 MG tablet Commonly known as:  LOPRESSOR TAKE 0.5 TABLETS (25 MG TOTAL) BY MOUTH 2 (TWO) TIMES DAILY.   omeprazole 20 MG capsule Commonly known as:  PRILOSEC TAKE ONE CAPSULE BY MOUTH TWICE A DAY What changed:  See the new instructions.   rivaroxaban 10 MG Tabs tablet Commonly known as:  XARELTO Take 1 tablet (10 mg total) by mouth daily.   traMADol 50 MG tablet Commonly known as:  ULTRAM Take 50-100 mg by mouth at bedtime as needed for moderate pain or severe pain.        SignedLajean Manes 11/20/2015, 7:49 AM

## 2015-11-20 NOTE — Progress Notes (Signed)
Called report to [facility] no answer on RN supervisor phone   D  Mateo Flow RN

## 2015-11-20 NOTE — Progress Notes (Signed)
Subjective: 3 Days Post-Op Procedure(s) (LRB): RIGHT TOTAL KNEE ARTHROPLASTY (Right) Patient reports pain as mild to right knee.  Reports a good night and she is ready for D/c. Progressing with PT. Tolerating PO's well. Denies CP,SOB, or calf pain.  Objective: Vital signs in last 24 hours: Temp:  [98.3 F (36.8 C)-99 F (37.2 C)] 98.9 F (37.2 C) (07/31 0600) Pulse Rate:  [68-82] 74 (07/31 0600) Resp:  [16] 16 (07/31 0600) BP: (124-156)/(61-88) 156/66 (07/31 0600) SpO2:  [98 %-99 %] 98 % (07/31 0600)  Intake/Output from previous day: 07/30 0701 - 07/31 0700 In: 1080 [P.O.:1080] Out: -  Intake/Output this shift: No intake/output data recorded.   Recent Labs  11/18/15 0410 11/19/15 0422 11/20/15 0418  HGB 11.7* 11.1* 11.0*    Recent Labs  11/19/15 0422 11/20/15 0418  WBC 6.7 5.8  RBC 3.82* 3.76*  HCT 34.1* 34.0*  PLT 164 140*    Recent Labs  11/18/15 0410 11/19/15 0422  NA 137 138  K 4.0 3.9  CL 106 105  CO2 24 29  BUN 13 15  CREATININE 0.66 0.75  GLUCOSE 143* 143*  CALCIUM 8.9 8.9   No results for input(s): LABPT, INR in the last 72 hours.  Well nourished. Alert and oriented x3. RRR, Lungs clear, BS x4. Abdomen soft and non tender. Right Calf soft and non tender. Right knee dressing C/D/I. No DVT signs. Compartment soft. No signs of infection.  Right LE grossly neurovascular intact.  Assessment/Plan: 3 Days Post-Op Procedure(s) (LRB): RIGHT TOTAL KNEE ARTHROPLASTY (Right) D/c to  [facility] today Up with PT Follow instructions F/u in the office in 2 weeks On Lovenox and will start [name] [name], [name] [name] 11/20/2015, 7:50 AM

## 2015-11-20 NOTE — Progress Notes (Signed)
Physical Therapy Treatment Patient Details Name: [name] [name] MRN: [id number] DOB: April 03, 1941 Today's Date: 11/20/2015    History of Present Illness s/p R TKR. PMH includes R TSA 2016    PT Comments    Progressing well with mobility. Plan is for d/c to SNF later today.   Follow Up Recommendations  SNF     Equipment Recommendations  Rolling walker with 5" wheels    Recommendations for Other Services       Precautions / Restrictions Precautions Precautions: Knee;Fall Required Braces or Orthoses: Knee Immobilizer - Right Knee Immobilizer - Right: On at all times Restrictions Weight Bearing Restrictions: No Other Position/Activity Restrictions: WBAT    Mobility  Bed Mobility Overal bed mobility: Needs Assistance Bed Mobility: Supine to Sit     Supine to sit: Mod assist;HOB elevated     General bed mobility comments: Assist for trunk and R LE. Increased time.   Transfers Overall transfer level: Needs assistance Equipment used: Rolling walker (2 wheeled) Transfers: Sit to/from Stand Sit to Stand: Min guard         General transfer comment: close guard for safety. VCs safety, hand/LE placement  Ambulation/Gait Ambulation/Gait assistance: Min guard Ambulation Distance (Feet): 90 Feet Assistive device: Rolling walker (2 wheeled) Gait Pattern/deviations: Step-to pattern;Antalgic     General Gait Details: close guard for safety. slow gait speed. VCs safety, sequence.    Stairs            Wheelchair Mobility    Modified Rankin (Stroke Patients Only)       Balance                                    Cognition Arousal/Alertness: Awake/alert Behavior During Therapy: WFL for tasks assessed/performed Overall Cognitive Status: Within Functional Limits for tasks assessed                      Exercises Total Joint Exercises Ankle Circles/Pumps: AROM;Both;10 reps Quad Sets: AROM;Strengthening;Both;10 reps Heel Slides:  AAROM;Right;10 reps Hip ABduction/ADduction: AAROM;Right;10 reps Straight Leg Raises: AAROM;Right;10 reps Goniometric ROM: ~10-60 degrees    General Comments        Pertinent Vitals/Pain Pain Assessment: 0-10 Pain Score: 6  Pain Location: R knee with activity Pain Descriptors / Indicators: Sore Pain Intervention(s): Monitored during session;Ice applied;Repositioned    Home Living                      Prior Function            PT Goals (current goals can now be found in the care plan section) Progress towards PT goals: Progressing toward goals    Frequency  7X/week    PT Plan Current plan remains appropriate    Co-evaluation             End of Session Equipment Utilized During Treatment: Gait belt;Right knee immobilizer Activity Tolerance: Patient tolerated treatment well Patient left: in chair;with call bell/phone within reach     Time: 0940-1001 PT Time Calculation (min) (ACUTE ONLY): 21 min  Charges:  $Gait Training: 8-22 mins                    G Codes:      Weston Anna, MPT Pager: [phone]

## 2015-11-20 NOTE — Clinical Social Work Placement (Signed)
   CLINICAL SOCIAL WORK PLACEMENT  NOTE  Date:  11/20/2015  Patient Details  Name: [name] [name] MRN: [id number] Date of Birth: 1941-01-13  Clinical Social Work is seeking post-discharge placement for this patient at the [place] level of care (*CSW will initial, date and re-position this form in  chart as items are completed):  Yes   Patient/family provided with [place] Work Department's list of facilities offering this level of care within the geographic area requested by the patient (or if unable, by the patient's family).  Yes   Patient/family informed of their freedom to choose among providers that offer the needed level of care, that participate in Medicare, Medicaid or managed care program needed by the patient, have an available bed and are willing to accept the patient.  Yes   Patient/family informed of 's ownership interest in [facility] and [facility], as well as of the fact that they are under no obligation to receive care at these facilities.  PASRR submitted to EDS on 11/20/15     PASRR number received on 11/20/15     Existing PASRR number confirmed on       FL2 transmitted to all facilities in geographic area requested by pt/family on 11/19/15     FL2 transmitted to all facilities within larger geographic area on       Patient informed that his/her managed care company has contracts with or will negotiate with certain facilities, including the following:        Yes   Patient/family informed of bed offers received.  Patient chooses bed at [facility]     Physician recommends and patient chooses bed at      Patient to be transferred to [facility] on 11/20/15.  Patient to be transferred to facility by Family     Patient family notified on 11/20/15 of transfer.  Name of family member notified:  Sister     PHYSICIAN       Additional Comment: Pt / sister are in agreement with d/c to [facility] today.  Pt requested to transport by CAR. D/C Summary sent to SNF for review. Scripts included in d/c packet. # for report provided to [place]. D/C packet provided to pt.   _______________________________________________ Luretha Rued, [place] 11/20/2015, 4:17 PM

## 2015-11-21 ENCOUNTER — Encounter (HOSPITAL_COMMUNITY): Payer: Self-pay | Admitting: Specialist

## 2015-11-21 ENCOUNTER — Non-Acute Institutional Stay (SKILLED_NURSING_FACILITY): Payer: Medicare Other | Admitting: Internal Medicine

## 2015-11-21 DIAGNOSIS — K219 Gastro-esophageal reflux disease without esophagitis: Secondary | ICD-10-CM

## 2015-11-21 DIAGNOSIS — I10 Essential (primary) hypertension: Secondary | ICD-10-CM | POA: Diagnosis not present

## 2015-11-21 DIAGNOSIS — R2681 Unsteadiness on feet: Secondary | ICD-10-CM | POA: Diagnosis not present

## 2015-11-21 DIAGNOSIS — F329 Major depressive disorder, single episode, unspecified: Secondary | ICD-10-CM | POA: Diagnosis not present

## 2015-11-21 DIAGNOSIS — E785 Hyperlipidemia, unspecified: Secondary | ICD-10-CM

## 2015-11-21 DIAGNOSIS — D62 Acute posthemorrhagic anemia: Secondary | ICD-10-CM | POA: Diagnosis not present

## 2015-11-21 DIAGNOSIS — D696 Thrombocytopenia, unspecified: Secondary | ICD-10-CM | POA: Diagnosis not present

## 2015-11-21 DIAGNOSIS — M1711 Unilateral primary osteoarthritis, right knee: Secondary | ICD-10-CM | POA: Diagnosis not present

## 2015-11-21 NOTE — Progress Notes (Signed)
LOCATION: [place]  PCP: Lottie Dawson, MD   Code Status: Full Code  Goals of care: Advanced Directive information Advanced Directives 11/17/2015  Does patient have an advance directive? No  Would patient like information on creating an advanced directive? No - patient declined information       Extended Emergency Contact Information Primary Emergency Contact: Maggie Schwalbe, [place] Phone: [phone] Work Phone: [phone] Mobile Phone: [phone] Relation: Sister Secondary Emergency Contact: Musselwhite,Barry Address: [address]          [place], [place] [address] Johnnette Litter of [place] Phone: [phone] Relation: Son   Allergies  Allergen Reactions  . Aspirin Other (See Comments)    Acute renal failure   . Lactose Intolerance (Gi) Other (See Comments)    Gi upset  . Meloxicam     REACTION: acute renal failure  with hypovolemic trigger Hospital  3 2011 Pt states allergic to all NSAIDS   . Sulfamethoxazole Hives  . Tylenol [Acetaminophen] Other (See Comments)    Acute renal failure     Chief Complaint  Patient presents with  . New Admit To SNF    New Admission     HPI:  Patient is a 75 y.o. female seen today for short term rehabilitation post hospital admission from 11/17/15-11/20/15 with right knee OA. She underwent right total knee arthroplasty. She is seen in her room today.   Review of Systems:  Constitutional: Negative for fever, chills, diaphoresis. Energy level is slowly coming back. HENT: Negative for headache, congestion, difficulty swallowing.   Eyes: Negative for blurred vision, double vision and discharge. Wears glasses.  Respiratory: Negative for cough, shortness of breath and wheezing.   Cardiovascular: Negative for chest pain, palpitations, leg swelling.  Gastrointestinal: Negative for heartburn, nausea, vomiting, abdominal pain. Last bowel movement was day after  surgery. Genitourinary: Negative for dysuria and flank pain.  Musculoskeletal: Negative for back pain, fall in the facility.  Skin: Negative for itching, rash.  Neurological: Negative for dizziness. Psychiatric/Behavioral: Negative for depression   Past Medical History:  Diagnosis Date  . Acute kidney failure ([place])    5 years ago  . ACUTE KIDNEY FAILURE UNSPECIFIED 07/12/2009  . Allergy   . Anxiety   . Arthritis   . CAD (coronary artery disease)    Mild nonobstructive by CT  . 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 Cataract   . Chronic kidney disease    stones  . Depression   . Diverticulosis of colon (without mention of hemorrhage)   . Fibromyalgia   . GERD (gastroesophageal reflux disease)   . Hiatal hernia   . Hyperlipidemia   . Hypertension   . Osteopenia   . PONV (postoperative nausea and vomiting)    Past Surgical History:  Procedure Laterality Date  . BLADDER TUMOR EXCISION  2001  . CATARACT EXTRACTION     bilateral  . CHOLECYSTECTOMY    . CYSTECTOMY  2001   benign  . ESOPHAGEAL MANOMETRY N/A 07/13/2012   Procedure: ESOPHAGEAL MANOMETRY (EM);  Surgeon: Sable Feil, MD;  Location: [facility];  Service: Endoscopy;  Laterality: N/A;  . LUMBAR DISC SURGERY     x 2 elsner  . REVERSE SHOULDER ARTHROPLASTY Right 08/12/2014   Procedure: RIGHT REVERSE TOTAL SHOULDER ARTHROPLASTY;  Surgeon: Netta Cedars, MD;  Location: [facility];  Service: Orthopedics;  Laterality: Right;  . TENDON REPAIR  11/2005  right shoulder  . TOTAL KNEE ARTHROPLASTY     left  . TOTAL KNEE ARTHROPLASTY Right 11/17/2015   Procedure: RIGHT TOTAL KNEE ARTHROPLASTY;  Surgeon: Sydnee Cabal, MD;  Location: [facility];  Service: Orthopedics;  Laterality: Right;   Social History:   reports that she has never smoked. She has never used smokeless tobacco. She reports that she drinks alcohol. She reports that she does not use drugs.  Family History  Problem Relation Age of Onset  . Alzheimer's disease Mother    . Lung cancer Father     tobacco  . Kidney cancer Son   . Hypertension Brother     mom's side  . Osteoporosis Sister   . Breast cancer Cousin   . Heart disease Maternal Uncle     x 4  . Heart disease Maternal Aunt     x 4  . Diabetes Paternal Grandmother   . Diabetes Paternal Uncle     x 2  . Colon cancer Neg Hx     Medications:   Medication List       Accurate as of 11/21/15 12:15 PM. Always use your most recent med list.          albuterol 108 (90 Base) MCG/ACT inhaler Commonly known as:  PROVENTIL HFA;VENTOLIN HFA Inhale 2 puffs into the lungs every 6 (six) hours as needed for wheezing or shortness of breath.   ALIGN PO Take 1 tablet by mouth daily.   amLODipine 5 MG tablet Commonly known as:  NORVASC TAKE 1 TABLET BY MOUTH EVERY DAY   B-12 PO Take 1 tablet by mouth daily. Reported on 05/29/2015   baclofen 10 MG tablet Commonly known as:  LIORESAL Take 1 tablet (10 mg total) by mouth 3 (three) times daily as needed for muscle spasms.   CRESTOR 40 MG tablet Generic drug:  rosuvastatin TAKE 1 TABLET BY MOUTH EVERY DAY   DULoxetine 60 MG capsule Commonly known as:  CYMBALTA TAKE ONE CAPSULE BY MOUTH TWICE A DAY   HYDROmorphone 2 MG tablet Commonly known as:  DILAUDID Take 1-2 tablets (2-4 mg total) by mouth every 4 (four) hours as needed for severe pain.   metoprolol 50 MG tablet Commonly known as:  LOPRESSOR TAKE 0.5 TABLETS (25 MG TOTAL) BY MOUTH 2 (TWO) TIMES DAILY.   omeprazole 20 MG capsule Commonly known as:  PRILOSEC TAKE ONE CAPSULE BY MOUTH TWICE A DAY   rivaroxaban 10 MG Tabs tablet Commonly known as:  XARELTO Take 1 tablet (10 mg total) by mouth daily.   traMADol 50 MG tablet Commonly known as:  ULTRAM Take 1-2 tablets (50-100 mg total) by mouth at bedtime as needed for moderate pain or severe pain.       Immunizations: Immunization History  Administered Date(s) Administered  . Influenza Split 03/22/2011, 01/16/2012, 01/20/2013    . Influenza Whole 02/08/2010  . Influenza, High Dose Seasonal PF 03/14/2014, 05/29/2015  . PPD Test 11/20/2015  . Pneumococcal Conjugate-13 06/16/2013  . Pneumococcal Polysaccharide-23 07/25/2008  . Td 11/21/2007     Physical Exam:  Vitals:   11/21/15 1209  BP: (!) 111/59  Pulse: 71  Resp: 18  Temp: 99.5 F (37.5 C)  TempSrc: Oral  SpO2: 95%  Weight: 169 lb (76.7 kg)  Height: 5\' 1"  (1.549 m)   Body mass index is 31.93 kg/m.  General- elderly female, obese, in no acute distress Head- normocephalic, atraumatic Nose- no nasal discharge Throat- moist mucus membrane Eyes- PERRLA, EOMI, no pallor,  no icterus Neck- no cervical lymphadenopathy Cardiovascular- normal s1,s2, no murmur Respiratory- bilateral clear to auscultation, no wheeze, no rhonchi, no crackles, no use of accessory muscles Abdomen- bowel sounds present, soft, non tender Musculoskeletal- able to move all 4 extremities, limited right knee range of motion Neurological- alert and oriented to person, place and time Skin- warm and dry, right knee surgical incision with aquacel dressing Psychiatry- normal mood and affect    Labs reviewed: Basic Metabolic Panel:  Recent Labs  11/10/15 0900 11/18/15 0410 11/19/15 0422  NA 140 137 138  K 4.5 4.0 3.9  CL 106 106 105  CO2 28 24 29   GLUCOSE 100* 143* 143*  BUN 19 13 15   CREATININE 0.93 0.66 0.75  CALCIUM 9.2 8.9 8.9    CBC:  Recent Labs  11/18/15 0410 11/19/15 0422 11/20/15 0418  WBC 8.5 6.7 5.8  HGB 11.7* 11.1* 11.0*  HCT 35.2* 34.1* 34.0*  MCV 88.7 89.3 90.4  PLT 152 164 140*     Assessment/Plan  Unsteady gait To work with PT and OT for gait training and muscle strengthening, fall precautions  Right knee OA S/p right total knee arthroplasty. Has orthopedics follow up. Will have her work with physical therapy and occupational therapy team to help with gait training and muscle strengthening exercises.fall precautions. Skin care.  Encourage to be out of bed. Continue xarelto for DVT prophylaxis. Continue dilaudid 2 mg 1-2 tab q4h prn pain and tramadol 50-100 mg qhs prn pain.   Thrombocytopenia No bleed reported. Monitor platelet count  Blood loss anemia Monitor cbc, post op  Constipation Add colace 100 mg bid and miralax daily as needed and monitor  Depression Continue cymbalta 60 mg bid  gerd Continue omeprazole 20 mg bid  HTN Continue amlodipine 5 mg daily, monitor bp   HLD Continue 40 mg po daily   Goals of care: short term rehabilitation   Labs/tests ordered: cbc, bmp   Family/ staff Communication: reviewed care plan with patient and nursing supervisor    Blanchie Serve, MD Internal Medicine [place], [place] [address] Cell Phone (Monday-Friday 8 am - 5 pm): [phone] On Call: [phone] and follow prompts after 5 pm and on weekends Office Phone: [phone] Office Fax: [phone]

## 2015-11-27 LAB — HEPATIC FUNCTION PANEL
ALK PHOS: 80 U/L (ref 25–125)
ALT: 17 U/L (ref 7–35)
AST: 13 U/L (ref 13–35)
BILIRUBIN, TOTAL: 0.8 mg/dL

## 2015-11-27 LAB — BASIC METABOLIC PANEL
BUN: 15 mg/dL (ref 4–21)
Creatinine: 0.7 mg/dL (ref 0.5–1.1)
Glucose: 102 mg/dL
POTASSIUM: 4.6 mmol/L (ref 3.4–5.3)
SODIUM: 137 mmol/L (ref 137–147)

## 2015-11-27 LAB — CBC AND DIFFERENTIAL
HCT: 34 % — AB (ref 36–46)
Hemoglobin: 10.7 g/dL — AB (ref 12.0–16.0)
NEUTROS ABS: 3 /uL
Platelets: 259 10*3/uL (ref 150–399)
WBC: 5.5 10^3/mL

## 2015-11-30 ENCOUNTER — Encounter: Payer: Self-pay | Admitting: Adult Health

## 2015-11-30 ENCOUNTER — Non-Acute Institutional Stay (SKILLED_NURSING_FACILITY): Payer: Medicare Other | Admitting: Adult Health

## 2015-11-30 DIAGNOSIS — D62 Acute posthemorrhagic anemia: Secondary | ICD-10-CM

## 2015-11-30 DIAGNOSIS — E43 Unspecified severe protein-calorie malnutrition: Secondary | ICD-10-CM | POA: Diagnosis not present

## 2015-11-30 NOTE — Progress Notes (Signed)
Patient ID: [name] [name], female   DOB: 19-Dec-1940, 75 y.o.   MRN: [id number]    DATE:  11/30/15   MRN:  [id number]  BIRTHDAY: 05/13/1940  Facility:  Nursing Home Location:  [facility] Room Number: 606-P  LEVEL OF CARE:  SNF [phone]  Contact Information    Name Relation Home Work [place] Sister [phone]   Laveda Abbe [phone]     Lowe,Sherry Daughter [phone]  [phone]       Code Status History    Date Active Date Inactive Code Status Order ID Comments User Context   11/17/2015  5:18 PM 11/20/2015  4:44 PM Full Code [id number]  Rowan Blase Inpatient   08/12/2014 12:56 PM 08/15/2014  6:53 PM Full Code [id number]  Netta Cedars, MD Inpatient   03/31/2012 11:35 PM 04/01/2012  2:56 PM Full Code [id number]  Evelina Bucy, MD ED       Chief Complaint  Patient presents with  . Acute Visit    Severe protein calorie malnutrition    HISTORY OF PRESENT ILLNESS:  This is a 75 year old female who was noted to have albumin  2.99.   She has been admitted to [facility] on 11/20/15 from [facility] with right knee osteoarthritis for which she had right total knee arthroplasty. She has been admitted for a short-term rehabilitation.  PAST MEDICAL HISTORY:  Past Medical History:  Diagnosis Date  . Acute kidney failure ([place])    5 years ago  . ACUTE KIDNEY FAILURE UNSPECIFIED 07/12/2009  . Allergy   . Anxiety   . Arthritis   . CAD (coronary artery disease)    Mild nonobstructive by CT  . 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 Cataract   . Chronic kidney disease    stones  . Depression   . Diverticulosis of colon (without mention of hemorrhage)   . Fibromyalgia   . GERD (gastroesophageal reflux disease)   . Hiatal hernia   . Hyperlipidemia   . Hypertension   . Osteopenia   . PONV (postoperative nausea and vomiting)      CURRENT MEDICATIONS: Reviewed  Patient's Medications   New Prescriptions   No medications on file  Previous Medications   ALBUTEROL (PROVENTIL HFA;VENTOLIN HFA) 108 (90 BASE) MCG/ACT INHALER    Inhale 2 puffs into the lungs every 6 (six) hours as needed for wheezing or shortness of breath.   AMLODIPINE (NORVASC) 5 MG TABLET    TAKE 1 TABLET BY MOUTH EVERY DAY   BACLOFEN (LIORESAL) 10 MG TABLET    Take 1 tablet (10 mg total) by mouth 3 (three) times daily as needed for muscle spasms.   CRESTOR 40 MG TABLET    TAKE 1 TABLET BY MOUTH EVERY DAY   CYANOCOBALAMIN (B-12 PO)    Take 500 mcg by mouth daily. Reported on 05/29/2015   DOCUSATE SODIUM (COLACE) 100 MG CAPSULE    Take 100 mg by mouth 2 (two) times daily.   DULOXETINE (CYMBALTA) 60 MG CAPSULE    TAKE ONE CAPSULE BY MOUTH TWICE A DAY   HYDROMORPHONE (DILAUDID) 2 MG TABLET    Take 2-4 mg by mouth every 4 (four) hours as needed for moderate pain or severe pain.   METOPROLOL (LOPRESSOR) 50 MG TABLET    TAKE 0.5 TABLETS (25 MG TOTAL) BY MOUTH 2 (TWO) TIMES DAILY.   OMEPRAZOLE (PRILOSEC) 20 MG CAPSULE    TAKE ONE  CAPSULE BY MOUTH TWICE A DAY   POLYETHYLENE GLYCOL POWDER (MIRALAX) POWDER    Take 17 g by mouth daily as needed for mild constipation.    PROBIOTIC PRODUCT (ALIGN PO)    Take 1 tablet by mouth daily.   PROTEIN (PROCEL) POWD    Take 2 scoop by mouth 2 (two) times daily.   RIVAROXABAN (XARELTO) 10 MG TABS TABLET    Take 1 tablet (10 mg total) by mouth daily.   TRAMADOL (ULTRAM) 50 MG TABLET    Take 1-2 tablets (50-100 mg total) by mouth at bedtime as needed for moderate pain or severe pain.  Modified Medications   No medications on file  Discontinued Medications   HYDROMORPHONE (DILAUDID) 2 MG TABLET    Take 1-2 tablets (2-4 mg total) by mouth every 4 (four) hours as needed for severe pain.     Allergies  Allergen Reactions  . Aspirin Other (See Comments)    Acute renal failure   . Lactose Intolerance (Gi) Other (See Comments)    Gi upset  . Meloxicam     REACTION: acute renal failure   with hypovolemic trigger Hospital  3 2011 Pt states allergic to all NSAIDS   . Sulfamethoxazole Hives  . Tylenol [Acetaminophen] Other (See Comments)    Acute renal failure      REVIEW OF SYSTEMS:  GENERAL: no change in appetite, no fatigue, no weight changes, no fever, chills or weakness EYES: Denies change in vision, dry eyes, eye pain, itching or discharge EARS: Denies change in hearing, ringing in ears, or earache NOSE: Denies nasal congestion or epistaxis MOUTH and THROAT: Denies oral discomfort, gingival pain or bleeding, pain from teeth or hoarseness   RESPIRATORY: no cough, SOB, DOE, wheezing, hemoptysis CARDIAC: no chest pain, edema or palpitations GI: no abdominal pain, diarrhea, constipation, heart burn, nausea or vomiting GU: Denies dysuria, frequency, hematuria, incontinence, or discharge PSYCHIATRIC: Denies feeling of depression or anxiety. No report of hallucinations, insomnia, paranoia, or agitation    PHYSICAL EXAMINATION  GENERAL APPEARANCE: Well nourished. In no acute distress. Obese SKIN:  Right knee surgical incision is covered with aquacel dressing, dry and no redness HEAD: Normal in size and contour. No evidence of trauma EYES: Lids open and close normally. No blepharitis, entropion or ectropion. PERRL. Conjunctivae are clear and sclerae are white. Lenses are without opacity EARS: Pinnae are normal. Patient hears normal voice tunes of the examiner MOUTH and THROAT: Lips are without lesions. Oral mucosa is moist and without lesions. Tongue is normal in shape, size, and color and without lesions NECK: supple, trachea midline, no neck masses, no thyroid tenderness, no thyromegaly LYMPHATICS: no LAN in the neck, no supraclavicular LAN RESPIRATORY: breathing is even & unlabored, BS CTAB CARDIAC: RRR, no murmur,no extra heart sounds, no edema GI: abdomen soft, normal BS, no masses, no tenderness, no hepatomegaly, no splenomegaly EXTREMITIES:  Able to move X 4  extremities PSYCHIATRIC: Alert and oriented X 3. Affect and behavior are appropriate  LABS/RADIOLOGY: Labs reviewed: 11/27/15   Albumin 2.99 total protein 5.1 Basic Metabolic Panel:  Recent Labs  11/10/15 0900 11/18/15 0410 11/19/15 0422 11/27/15 1227  NA 140 137 138 137  K 4.5 4.0 3.9 4.6  CL 106 106 105  --   CO2 28 24 29   --   GLUCOSE 100* 143* 143*  --   BUN 19 13 15 15   CREATININE 0.93 0.66 0.75 0.7  CALCIUM 9.2 8.9 8.9  --    Liver Function  Tests:  Recent Labs  11/27/15 1227  AST 13  ALT 17  ALKPHOS 80   CBC:  Recent Labs  11/18/15 0410 11/19/15 0422 11/20/15 0418 11/27/15 1227  WBC 8.5 6.7 5.8 5.5  NEUTROABS  --   --   --  3  HGB 11.7* 11.1* 11.0* 10.7*  HCT 35.2* 34.1* 34.0* 34*  MCV 88.7 89.3 90.4  --   PLT 152 164 140* 259     ASSESSMENT/PLAN:  Protein calorie malnutrition, severe - albumin 2.99; start Procel 2 scoops by mouth twice a day; RD consult  Acute blood loss anemia - stable; check cbc in 1 week Lab Results  Component Value Date   HGB 10.7 (A) 11/27/2015       Durenda Age, NP [employer] [phone]

## 2015-12-08 ENCOUNTER — Non-Acute Institutional Stay (SKILLED_NURSING_FACILITY): Payer: Medicare Other | Admitting: Adult Health

## 2015-12-08 ENCOUNTER — Encounter: Payer: Self-pay | Admitting: Adult Health

## 2015-12-08 DIAGNOSIS — K219 Gastro-esophageal reflux disease without esophagitis: Secondary | ICD-10-CM | POA: Diagnosis not present

## 2015-12-08 DIAGNOSIS — E785 Hyperlipidemia, unspecified: Secondary | ICD-10-CM

## 2015-12-08 DIAGNOSIS — F329 Major depressive disorder, single episode, unspecified: Secondary | ICD-10-CM | POA: Diagnosis not present

## 2015-12-08 DIAGNOSIS — I10 Essential (primary) hypertension: Secondary | ICD-10-CM | POA: Diagnosis not present

## 2015-12-08 DIAGNOSIS — M1711 Unilateral primary osteoarthritis, right knee: Secondary | ICD-10-CM | POA: Diagnosis not present

## 2015-12-08 DIAGNOSIS — E43 Unspecified severe protein-calorie malnutrition: Secondary | ICD-10-CM

## 2015-12-08 DIAGNOSIS — D62 Acute posthemorrhagic anemia: Secondary | ICD-10-CM | POA: Diagnosis not present

## 2015-12-08 DIAGNOSIS — R2681 Unsteadiness on feet: Secondary | ICD-10-CM

## 2015-12-08 DIAGNOSIS — K5901 Slow transit constipation: Secondary | ICD-10-CM | POA: Diagnosis not present

## 2015-12-08 NOTE — Progress Notes (Signed)
Patient ID: [name] [name], female   DOB: April 04, 1941, 75 y.o.   MRN: [id number]    DATE:  12/08/15   MRN:  [id number]  BIRTHDAY: Jul 01, 1940  Facility:  Nursing Home Location:  [place] Room Number: 606-P  LEVEL OF CARE:  SNF [phone])  Contact Information    Name Relation Home Work [place] Sister [phone]   Laveda Abbe [phone]     Lowe,Sherry Daughter [phone]  [phone]       Code Status History    Date Active Date Inactive Code Status Order ID Comments User Context   11/17/2015  5:18 PM 11/20/2015  4:44 PM Full Code [id number]  Rowan Blase Inpatient   08/12/2014 12:56 PM 08/15/2014  6:53 PM Full Code [id number]  Netta Cedars, MD Inpatient   03/31/2012 11:35 PM 04/01/2012  2:56 PM Full Code [id number]  Evelina Bucy, MD ED       Chief Complaint  Patient presents with  . Discharge Note    HISTORY OF PRESENT ILLNESS:  This is a 75 year old female who is for discharge home with outpatient rehabilitation. She will leave with medicatons and prescriptions. DME:  Rolling walker  .   She has been admitted to [facility] on 11/20/15 from [facility] with right knee osteoarthritis for which she had right total knee arthroplasty.   Patient was admitted to this facility for short-term rehabilitation after the patient's recent hospitalization.  Patient has completed SNF rehabilitation and therapy has cleared the patient for discharge.   PAST MEDICAL HISTORY:  Past Medical History:  Diagnosis Date  . Acute kidney failure ([place])    5 years ago  . ACUTE KIDNEY FAILURE UNSPECIFIED 07/12/2009  . Allergy   . Anxiety   . Arthritis   . CAD (coronary artery disease)    Mild nonobstructive by CT  . 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 Cataract   . Chronic kidney disease    stones  . Depression   . Diverticulosis of colon (without mention of hemorrhage)   . Fibromyalgia   . GERD  (gastroesophageal reflux disease)   . Hiatal hernia   . Hyperlipidemia   . Hypertension   . Osteopenia   . PONV (postoperative nausea and vomiting)      CURRENT MEDICATIONS: Reviewed  Patient's Medications  New Prescriptions   No medications on file  Previous Medications   ALBUTEROL (PROVENTIL HFA;VENTOLIN HFA) 108 (90 BASE) MCG/ACT INHALER    Inhale 2 puffs into the lungs every 6 (six) hours as needed for wheezing or shortness of breath.   AMLODIPINE (NORVASC) 5 MG TABLET    TAKE 1 TABLET BY MOUTH EVERY DAY   BACLOFEN (LIORESAL) 10 MG TABLET    Take 1 tablet (10 mg total) by mouth 3 (three) times daily as needed for muscle spasms.   CRESTOR 40 MG TABLET    TAKE 1 TABLET BY MOUTH EVERY DAY   CYANOCOBALAMIN (B-12 PO)    Take 500 mcg by mouth daily. Reported on 05/29/2015   DOCUSATE SODIUM (COLACE) 100 MG CAPSULE    Take 100 mg by mouth 2 (two) times daily.   DULOXETINE (CYMBALTA) 60 MG CAPSULE    TAKE ONE CAPSULE BY MOUTH TWICE A DAY   HYDROMORPHONE (DILAUDID) 2 MG TABLET    Take 2-4 mg by mouth every 4 (four) hours as needed for moderate pain or severe pain.   METOPROLOL (LOPRESSOR) 50  MG TABLET    TAKE 0.5 TABLETS (25 MG TOTAL) BY MOUTH 2 (TWO) TIMES DAILY.   OMEPRAZOLE (PRILOSEC) 20 MG CAPSULE    TAKE ONE CAPSULE BY MOUTH TWICE A DAY   POLYETHYLENE GLYCOL POWDER (MIRALAX) POWDER    Take 17 g by mouth daily as needed for mild constipation.    PROBIOTIC PRODUCT (ALIGN PO)    Take 1 tablet by mouth daily.   PROTEIN (PROCEL) POWD    Take 2 scoop by mouth 2 (two) times daily.   RIVAROXABAN (XARELTO) 10 MG TABS TABLET    Take 1 tablet (10 mg total) by mouth daily.   TRAMADOL (ULTRAM) 50 MG TABLET    Take 1-2 tablets (50-100 mg total) by mouth at bedtime as needed for moderate pain or severe pain.  Modified Medications   No medications on file  Discontinued Medications   No medications on file     Allergies  Allergen Reactions  . Aspirin Other (See Comments)    Acute renal failure    . Lactose Intolerance (Gi) Other (See Comments)    Gi upset  . Meloxicam     REACTION: acute renal failure  with hypovolemic trigger Hospital  3 2011 Pt states allergic to all NSAIDS   . Sulfamethoxazole Hives  . Tylenol [Acetaminophen] Other (See Comments)    Acute renal failure      REVIEW OF SYSTEMS:  GENERAL: no change in appetite, no fatigue, no weight changes, no fever, chills or weakness EYES: Denies change in vision, dry eyes, eye pain, itching or discharge EARS: Denies change in hearing, ringing in ears, or earache NOSE: Denies nasal congestion or epistaxis MOUTH and THROAT: Denies oral discomfort, gingival pain or bleeding, pain from teeth or hoarseness   RESPIRATORY: no cough, SOB, DOE, wheezing, hemoptysis CARDIAC: no chest pain, edema or palpitations GI: no abdominal pain, diarrhea, constipation, heart burn, nausea or vomiting GU: Denies dysuria, frequency, hematuria, incontinence, or discharge PSYCHIATRIC: Denies feeling of depression or anxiety. No report of hallucinations, insomnia, paranoia, or agitation    PHYSICAL EXAMINATION  GENERAL APPEARANCE: Well nourished. In no acute distress. Obese SKIN:  Right knee surgical incision has steri-strips, dry and no redness HEAD: Normal in size and contour. No evidence of trauma EYES: Lids open and close normally. No blepharitis, entropion or ectropion. PERRL. Conjunctivae are clear and sclerae are white. Lenses are without opacity EARS: Pinnae are normal. Patient hears normal voice tunes of the examiner MOUTH and THROAT: Lips are without lesions. Oral mucosa is moist and without lesions. Tongue is normal in shape, size, and color and without lesions NECK: supple, trachea midline, no neck masses, no thyroid tenderness, no thyromegaly LYMPHATICS: no LAN in the neck, no supraclavicular LAN RESPIRATORY: breathing is even & unlabored, BS CTAB CARDIAC: RRR, no murmur,no extra heart sounds, no edema GI: abdomen soft, normal  BS, no masses, no tenderness, no hepatomegaly, no splenomegaly EXTREMITIES:  Able to move X 4 extremities PSYCHIATRIC: Alert and oriented X 3. Affect and behavior are appropriate  LABS/RADIOLOGY: Labs reviewed: 12/07/15  WBC 3.9 hemoglobin 10.6 hematocrit 33.4 MCV 91.8 platelet 217 11/27/15   Albumin 2.99 total protein 5.1 Basic Metabolic Panel:  Recent Labs  11/10/15 0900 11/18/15 0410 11/19/15 0422 11/27/15 1227  NA 140 137 138 137  K 4.5 4.0 3.9 4.6  CL 106 106 105  --   CO2 28 24 29   --   GLUCOSE 100* 143* 143*  --   BUN 19 13 15  15  CREATININE 0.93 0.66 0.75 0.7  CALCIUM 9.2 8.9 8.9  --    Liver Function Tests:  Recent Labs  11/27/15 1227  AST 13  ALT 17  ALKPHOS 80   CBC:  Recent Labs  11/18/15 0410 11/19/15 0422 11/20/15 0418 11/27/15 1227  WBC 8.5 6.7 5.8 5.5  NEUTROABS  --   --   --  3  HGB 11.7* 11.1* 11.0* 10.7*  HCT 35.2* 34.1* 34.0* 34*  MCV 88.7 89.3 90.4  --   PLT 152 164 140* 259     ASSESSMENT/PLAN:  Unsteady gait - for outpatient rehabilitation; fall precaution  Right knee osteoarthritis S/P right total knee arthroplasty - for outpatient rehabilitation; continue Ultram 50 mg 1-2 tabs by mouth daily at bedtime when necessary and Dilaudid 2 mg 1-2 tabs by mouth every 4 hours when necessary for pain; Xarelto 10 mg 1 tab by mouth daily  till 12/24/15 for DVT prophylaxis; baclofen 10 mg 1 tab by mouth 3 times a day when necessary for muscle spasm; follow-up with orthopedic surgeon  Depression - mood this is stable; continue Cymbalta 60 mg 1 capsule by mouth twice a day  Hypertension - well-controlled; continue Lopressor 50 mg give 1/2 tab = 25 mg by mouth twice a day and Norvasc 5 mg 1 tab by mouth daily  Hyperlipidemia - continue Crestor 40 mg 1 tab by mouth daily at bedtime Lab Results  Component Value Date   CHOL 155 10/05/2014   HDL 71.20 10/05/2014   LDLCALC 68 10/05/2014   LDLDIRECT 179.1 10/09/2007   TRIG 78.0 10/05/2014   CHOLHDL  2 10/05/2014   Constipation - continue Colace 100 mg 1 capsule by mouth twice a day and MiraLAX 17 g by mouth daily when necessary  GERD - stable; continue Prilosec 20 mg by mouth twice a day  Protein calorie malnutrition, severe - albumin 2.99; continue Procel 2 scoops by mouth twice a day; referred to RD  Acute blood loss anemia - re-checked 10.6; stable      I have filled out patient's discharge paperwork and written prescriptions.  Patient will have outpatient rehabilitation.  DME provided:  Rolling walker  Total discharge time: Greater than 30 minutes  Greater than 50% was spent in counseling and coordination of care with the patient.  Discharge time involved coordination of the discharge process with social worker, nursing staff and therapy department. Medical justification for DME verified.    Durenda Age, NP [employer] [phone]

## 2015-12-28 ENCOUNTER — Ambulatory Visit: Payer: Self-pay | Admitting: Internal Medicine

## 2016-01-02 DIAGNOSIS — M1711 Unilateral primary osteoarthritis, right knee: Secondary | ICD-10-CM | POA: Diagnosis not present

## 2016-01-04 DIAGNOSIS — M1711 Unilateral primary osteoarthritis, right knee: Secondary | ICD-10-CM | POA: Diagnosis not present

## 2016-01-09 DIAGNOSIS — M1711 Unilateral primary osteoarthritis, right knee: Secondary | ICD-10-CM | POA: Diagnosis not present

## 2016-01-12 DIAGNOSIS — M1711 Unilateral primary osteoarthritis, right knee: Secondary | ICD-10-CM | POA: Diagnosis not present

## 2016-01-19 DIAGNOSIS — M1711 Unilateral primary osteoarthritis, right knee: Secondary | ICD-10-CM | POA: Diagnosis not present

## 2016-01-24 DIAGNOSIS — M1711 Unilateral primary osteoarthritis, right knee: Secondary | ICD-10-CM | POA: Diagnosis not present

## 2016-02-02 DIAGNOSIS — M1711 Unilateral primary osteoarthritis, right knee: Secondary | ICD-10-CM | POA: Diagnosis not present

## 2016-02-06 DIAGNOSIS — M1711 Unilateral primary osteoarthritis, right knee: Secondary | ICD-10-CM | POA: Diagnosis not present

## 2016-02-09 DIAGNOSIS — M1711 Unilateral primary osteoarthritis, right knee: Secondary | ICD-10-CM | POA: Diagnosis not present

## 2016-02-12 NOTE — Progress Notes (Deleted)
No chief complaint on file.   HPI: [name] [name] 75 y.o.  ROS: See pertinent positives and negatives per HPI.  Past Medical History:  Diagnosis Date  . Acute kidney failure ([place])    5 years ago  . ACUTE KIDNEY FAILURE UNSPECIFIED 07/12/2009  . Allergy   . Anxiety   . Arthritis   . CAD (coronary artery disease)    Mild nonobstructive by CT  . 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 Cataract   . Chronic kidney disease    stones  . Depression   . Diverticulosis of colon (without mention of hemorrhage)   . Fibromyalgia   . GERD (gastroesophageal reflux disease)   . Hiatal hernia   . Hyperlipidemia   . Hypertension   . Osteopenia   . PONV (postoperative nausea and vomiting)     Family History  Problem Relation Age of Onset  . Alzheimer's disease Mother   . Lung cancer Father     tobacco  . Kidney cancer Son   . Hypertension Brother     mom's side  . Osteoporosis Sister   . Breast cancer Cousin   . Heart disease Maternal Uncle     x 4  . Heart disease Maternal Aunt     x 4  . Diabetes Paternal Grandmother   . Diabetes Paternal Uncle     x 2  . Colon cancer Neg Hx     Social History   Social History  . Marital status: Widowed    Spouse name: N/A  . Number of children: 2  . Years of education: N/A   Occupational History  . Retired     Social History Main Topics  . Smoking status: Never Smoker  . Smokeless tobacco: Never Used  . Alcohol use 0.0 oz/week     Comment: rare  . Drug use: No  . Sexual activity: Not on file   Other Topics Concern  . 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 albuterol (PROVENTIL HFA;VENTOLIN HFA) 108 (90 BASE) MCG/ACT inhaler Inhale 2 puffs into the lungs every 6 (six) hours as needed for wheezing or shortness of breath. 1 Inhaler 2  . amLODipine (NORVASC) 5 MG tablet TAKE 1  TABLET BY MOUTH EVERY DAY 90 tablet 0  . baclofen (LIORESAL) 10 MG tablet Take 1 tablet (10 mg total) by mouth 3 (three) times daily as needed for muscle spasms. 50 each 2  . CRESTOR 40 MG tablet TAKE 1 TABLET BY MOUTH EVERY DAY 30 tablet 2  . Cyanocobalamin (B-12 PO) Take 500 mcg by mouth daily. Reported on 05/29/2015    . docusate sodium (COLACE) 100 MG capsule Take 100 mg by mouth 2 (two) times daily.    . DULoxetine (CYMBALTA) 60 MG capsule TAKE ONE CAPSULE BY MOUTH TWICE A DAY 60 capsule 2  . HYDROmorphone (DILAUDID) 2 MG tablet Take 2-4 mg by mouth every 4 (four) hours as needed for moderate pain or severe pain.    . metoprolol (LOPRESSOR) 50 MG tablet TAKE 0.5 TABLETS (25 MG TOTAL) BY MOUTH 2 (TWO) TIMES DAILY. 90 tablet 1  . omeprazole (PRILOSEC) 20 MG capsule TAKE ONE CAPSULE BY MOUTH TWICE A DAY 180 capsule 2  . polyethylene glycol powder (MIRALAX) powder Take 17 g by mouth  daily as needed for mild constipation.     . Probiotic Product (ALIGN PO) Take 1 tablet by mouth daily.    . Protein (PROCEL) POWD Take 2 scoop by mouth 2 (two) times daily.    . rivaroxaban (XARELTO) 10 MG TABS tablet Take 1 tablet (10 mg total) by mouth daily. 30 tablet 0  . traMADol (ULTRAM) 50 MG tablet Take 1-2 tablets (50-100 mg total) by mouth at bedtime as needed for moderate pain or severe pain. 60 tablet 5   No facility-administered medications prior to visit.      EXAM:  There were no vitals taken for this visit.  There is no height or weight on file to calculate BMI.  GENERAL: vitals reviewed and listed above, alert, oriented, appears well hydrated and in no acute distress HEENT: atraumatic, conjunctiva  clear, no obvious abnormalities on inspection of external nose and ears OP : no lesion edema or exudate  NECK: no obvious masses on inspection palpation  LUNGS: clear to auscultation bilaterally, no wheezes, rales or rhonchi, good air movement CV: HRRR, no clubbing cyanosis or  peripheral edema nl  cap refill  MS: moves all extremities without noticeable focal  abnormality PSYCH: pleasant and cooperative, no obvious depression or anxiety  ASSESSMENT AND PLAN:  Discussed the following assessment and plan:  No diagnosis found.  -Patient advised to return or notify health care team  if symptoms worsen ,persist or new concerns arise.  There are no Patient Instructions on file for this visit.   Standley Brooking. Exa Bomba M.D.

## 2016-02-13 ENCOUNTER — Ambulatory Visit (INDEPENDENT_AMBULATORY_CARE_PROVIDER_SITE_OTHER): Payer: Medicare Other | Admitting: Internal Medicine

## 2016-02-13 ENCOUNTER — Encounter: Payer: Self-pay | Admitting: Internal Medicine

## 2016-02-13 ENCOUNTER — Ambulatory Visit: Payer: Self-pay | Admitting: Internal Medicine

## 2016-02-13 VITALS — BP 136/86 | Temp 98.2°F | Ht 61.0 in | Wt 169.4 lb

## 2016-02-13 DIAGNOSIS — E785 Hyperlipidemia, unspecified: Secondary | ICD-10-CM

## 2016-02-13 DIAGNOSIS — Z23 Encounter for immunization: Secondary | ICD-10-CM | POA: Diagnosis not present

## 2016-02-13 DIAGNOSIS — R21 Rash and other nonspecific skin eruption: Secondary | ICD-10-CM | POA: Diagnosis not present

## 2016-02-13 DIAGNOSIS — R8290 Unspecified abnormal findings in urine: Secondary | ICD-10-CM | POA: Diagnosis not present

## 2016-02-13 DIAGNOSIS — R609 Edema, unspecified: Secondary | ICD-10-CM

## 2016-02-13 DIAGNOSIS — E8809 Other disorders of plasma-protein metabolism, not elsewhere classified: Secondary | ICD-10-CM

## 2016-02-13 DIAGNOSIS — Z79899 Other long term (current) drug therapy: Secondary | ICD-10-CM

## 2016-02-13 DIAGNOSIS — I10 Essential (primary) hypertension: Secondary | ICD-10-CM | POA: Diagnosis not present

## 2016-02-13 LAB — POC URINALSYSI DIPSTICK (AUTOMATED)
Blood, UA: NEGATIVE
GLUCOSE UA: NEGATIVE
Ketones, UA: NEGATIVE
NITRITE UA: NEGATIVE
Spec Grav, UA: >=1.03
UROBILINOGEN UA: 0.2
pH, UA: 5.5

## 2016-02-13 MED ORDER — BACLOFEN 10 MG PO TABS: 10.0000 mg | ORAL_TABLET | Freq: Three times a day (TID) | ORAL | 2 refills | Status: DC | PRN

## 2016-02-13 MED ORDER — ROSUVASTATIN CALCIUM 40 MG PO TABS: 40.0000 mg | ORAL_TABLET | Freq: Every day | ORAL | 2 refills | Status: DC

## 2016-02-13 NOTE — Progress Notes (Signed)
Pre visit review using our clinic review tool, if applicable. No additional management support is needed unless otherwise documented below in the visit note. 

## 2016-02-13 NOTE — Progress Notes (Signed)
Chief Complaint  Patient presents with  . Bilateral Rash on Feet  . Bilateral lower extremity swelling    HPI: [name] [name] 75 y.o.  sda appt for rash   Swelling feeet  2 mos out from tka right  And rehab   Has had toe nail thickening and issues for a lon gtim   Hx of rash in remote past given cr by derm  One broke her out and one worked and doesn't have the name     Mentioned  Athletes foot  Per derm at some point .   Onset for a while and worse at this time . waxws and wanes somewhat  itchy top of foot toes and  Scaly sole   Rash  Hx of same and hs or  Skin dry  Skin and n and   swelling  Feet since out of hospital rehab.  Comes and goe still going to PT.  No cp sob    Dry scalng.  Had  Low protein  And on supplement shakes    Home from rehab until August 19th   Still has r knee pain and nto tight but der Theda Sers says ok   would like rx baclofen prn for m spasm helped in   Rehab .   Needs refill crestor but 40 $ generic?  Print out   ROS: See pertinent positives and negatives per HPI.no cp sob   Fever chiils  May have had a uti recently  Past Medical History:  Diagnosis Date  . Acute kidney failure ([place])    5 years ago  . ACUTE KIDNEY FAILURE UNSPECIFIED 07/12/2009  . Allergy   . Anxiety   . Arthritis   . CAD (coronary artery disease)    Mild nonobstructive by CT  . 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 Cataract   . Chronic kidney disease    stones  . Depression   . Diverticulosis of colon (without mention of hemorrhage)   . Fibromyalgia   . GERD (gastroesophageal reflux disease)   . Hiatal hernia   . Hyperlipidemia   . Hypertension   . Osteopenia   . PONV (postoperative nausea and vomiting)     Family History  Problem Relation Age of Onset  . Alzheimer's disease Mother   . Lung cancer Father     tobacco  . Kidney cancer Son   . Hypertension Brother     mom's side  . Osteoporosis Sister   . Breast cancer Cousin   . Heart disease Maternal Uncle    x 4  . Heart disease Maternal Aunt     x 4  . Diabetes Paternal Grandmother   . Diabetes Paternal Uncle     x 2  . Colon cancer Neg Hx     Social History   Social History  . Marital status: Widowed    Spouse name: N/A  . Number of children: 2  . Years of education: N/A   Occupational History  . Retired     Social History Main Topics  . Smoking status: Never Smoker  . Smokeless tobacco: Never Used  . Alcohol use 0.0 oz/week     Comment: rare  . Drug use: No  . Sexual activity: Not Asked   Other Topics Concern  . 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 albuterol (PROVENTIL HFA;VENTOLIN HFA) 108 (90 BASE) MCG/ACT inhaler Inhale 2 puffs into the lungs every 6 (six) hours as needed for wheezing or shortness of breath. 1 Inhaler 2  . amLODipine (NORVASC) 5 MG tablet TAKE 1 TABLET BY MOUTH EVERY DAY 90 tablet 0  . Cyanocobalamin (B-12 PO) Take 500 mcg by mouth daily. Reported on 05/29/2015    . docusate sodium (COLACE) 100 MG capsule Take 100 mg by mouth 2 (two) times daily.    . DULoxetine (CYMBALTA) 60 MG capsule TAKE ONE CAPSULE BY MOUTH TWICE A DAY 60 capsule 2  . HYDROmorphone (DILAUDID) 2 MG tablet Take 2-4 mg by mouth every 4 (four) hours as needed for moderate pain or severe pain.    . metoprolol (LOPRESSOR) 50 MG tablet TAKE 0.5 TABLETS (25 MG TOTAL) BY MOUTH 2 (TWO) TIMES DAILY. 90 tablet 1  . omeprazole (PRILOSEC) 20 MG capsule TAKE ONE CAPSULE BY MOUTH TWICE A DAY 180 capsule 2  . polyethylene glycol powder (MIRALAX) powder Take 17 g by mouth daily as needed for mild constipation.     . Probiotic Product (ALIGN PO) Take 1 tablet by mouth daily.    . Protein (PROCEL) POWD Take 2 scoop by mouth 2 (two) times daily.    . traMADol (ULTRAM) 50 MG tablet Take 1-2 tablets (50-100 mg total) by mouth at bedtime as needed for  moderate pain or severe pain. 60 tablet 5  . baclofen (LIORESAL) 10 MG tablet Take 1 tablet (10 mg total) by mouth 3 (three) times daily as needed for muscle spasms. 50 each 2  . CRESTOR 40 MG tablet TAKE 1 TABLET BY MOUTH EVERY DAY 30 tablet 2  . rivaroxaban (XARELTO) 10 MG TABS tablet Take 1 tablet (10 mg total) by mouth daily. 30 tablet 0   No facility-administered medications prior to visit.      EXAM:  BP 136/86 (BP Location: Right Arm, Patient Position: Sitting, Cuff Size: Normal)   Temp 98.2 F (36.8 C) (Oral)   Ht 5\' 1"  (1.549 m)   Wt 169 lb 6.4 oz (76.8 kg)   BMI 32.01 kg/m   Body mass index is 32.01 kg/m.  GENERAL: vitals reviewed and listed above, alert, oriented, appears well hydrated and in no acute distress HEENT: atraumatic, conjunctiva  clear, no obvious abnormalities on inspection of external nose and ears OP : no lesion edema or exudate  NECK: no obvious masses on inspection palpation  LUNGS: clear to auscultation bilaterally, no wheezes, rales or rhonchi, good air movement CV: HRRR, no clubbing cyanosis 1+  peripheral edema nl cap refill bilaterally Feet show thickened toenails bilaterally and distal red slightly scaly rash distal dorsal foot over toes and on the sole. Left has some interdigital flaking but the right does not. There is some quiescent small varicose veins. No acute ulcers are draining. MS: moves all extremities without noticeable focal  abnormality decreased range of motion of right knee obvious osteoarthritic changes PSYCH: pleasant and cooperative, no obvious depression or anxiety very talkative today Lab Results  Component Value Date   WBC 5.5 11/27/2015   HGB 10.7 (A) 11/27/2015   HCT 34 (A) 11/27/2015   PLT 259 11/27/2015   GLUCOSE 143 (H) 11/19/2015   CHOL 155 10/05/2014   TRIG 78.0 10/05/2014   HDL 71.20 10/05/2014   LDLDIRECT 179.1 10/09/2007   LDLCALC 68 10/05/2014   ALT 17 11/27/2015   AST 13 11/27/2015   NA 137 11/27/2015  K  4.6 11/27/2015   CL 105 11/19/2015   CREATININE 0.7 11/27/2015   BUN 15 11/27/2015   CO2 29 11/19/2015   TSH 1.20 10/05/2014   INR 0.98 11/10/2015   HGBA1C 5.7 10/05/2014   Wt Readings from Last 3 Encounters:  02/13/16 169 lb 6.4 oz (76.8 kg)  12/08/15 178 lb 6.4 oz (80.9 kg)  11/30/15 178 lb 6.4 oz (80.9 kg)    ASSESSMENT AND PLAN:  Discussed the following assessment and plan:  Rash and nonspecific skin eruption - Plan: Basic metabolic panel, CBC with Differential/Platelet, Hepatic function panel, TSH, POCT Urinalysis, Dipstick, POCT Urinalysis Dipstick (Automated)  Edema, unspecified type - Plan: Basic metabolic panel, CBC with Differential/Platelet, Hepatic function panel, TSH, POCT Urinalysis, Dipstick  Need for prophylactic vaccination and inoculation against influenza - Plan: Flu vaccine HIGH DOSE PF (Fluzone High dose)  Essential hypertension - Plan: Basic metabolic panel, CBC with Differential/Platelet, Hepatic function panel, TSH, POCT Urinalysis, Dipstick  Nonspecific finding on examination of urine - Plan: POCT Urinalysis Dipstick (Automated)  Hypoalbuminemia - in rehab  Hyperlipidemia, unspecified hyperlipidemia type  Medication management Total visit 89mins > 50% spent counseling and coordinating care as indicated in above note and in instructions to patient .  Will take over and refill her baclofen right her generic Crestor and she will stop for good prices. -Patient advised to return or notify health care team  if symptoms worsen ,persist or new concerns arise.  Patient Instructions  Many causes of swelling    Including low protein .  Can recheck your kidney function also. And avoid  Sodium in diet .   Pain control should be from orthopedics at this time or their teeam.  Rash uncertain if allergic eczema vs   Atypical tinea.( fungus)  Can add  Topical lamisil ( generic otc ok)    For now    And maybe see your derm  Also.  Contact  About cream that helped  you in past.   Will notify you  of labs when available.  Can shop cost of generic  crestor .  Fu depending.   Labs and how you are doing,  Consider .    Scheduling .    Wellness visit with Wynetta Fines our   Health coach RN     Standley Brooking. Frady Taddeo M.D.

## 2016-02-13 NOTE — Patient Instructions (Addendum)
Many causes of swelling    Including low protein .  Can recheck your kidney function also. And avoid  Sodium in diet .   Pain control should be from orthopedics at this time or their teeam.  Rash uncertain if allergic eczema vs   Atypical tinea.( fungus)  Can add  Topical lamisil ( generic otc ok)    For now    And maybe see your derm  Also.  Contact  About cream that helped you in past.   Will notify you  of labs when available.  Can shop cost of generic  crestor .  Fu depending.   Labs and how you are doing,  Consider .    Scheduling .    Wellness visit with Wynetta Fines our   Health coach RN

## 2016-02-14 LAB — CBC WITH DIFFERENTIAL/PLATELET
BASOS PCT: 0.4 % (ref 0.0–3.0)
Basophils Absolute: 0 10*3/uL (ref 0.0–0.1)
EOS ABS: 0.1 10*3/uL (ref 0.0–0.7)
Eosinophils Relative: 2.4 % (ref 0.0–5.0)
HCT: 38.7 % (ref 36.0–46.0)
HEMOGLOBIN: 12.8 g/dL (ref 12.0–15.0)
LYMPHS ABS: 1.1 10*3/uL (ref 0.7–4.0)
Lymphocytes Relative: 27 % (ref 12.0–46.0)
MCHC: 33.1 g/dL (ref 30.0–36.0)
MCV: 85.8 fl (ref 78.0–100.0)
MONO ABS: 0.2 10*3/uL (ref 0.1–1.0)
Monocytes Relative: 5.9 % (ref 3.0–12.0)
NEUTROS PCT: 64.3 % (ref 43.0–77.0)
Neutro Abs: 2.6 10*3/uL (ref 1.4–7.7)
Platelets: 198 10*3/uL (ref 150.0–400.0)
RBC: 4.51 Mil/uL (ref 3.87–5.11)
RDW: 15.8 % — AB (ref 11.5–15.5)
WBC: 4 10*3/uL (ref 4.0–10.5)

## 2016-02-14 LAB — HEPATIC FUNCTION PANEL
ALT: 10 U/L (ref 0–35)
AST: 17 U/L (ref 0–37)
Albumin: 4.2 g/dL (ref 3.5–5.2)
Alkaline Phosphatase: 68 U/L (ref 39–117)
BILIRUBIN DIRECT: 0.2 mg/dL (ref 0.0–0.3)
BILIRUBIN TOTAL: 0.8 mg/dL (ref 0.2–1.2)
Total Protein: 7 g/dL (ref 6.0–8.3)

## 2016-02-14 LAB — BASIC METABOLIC PANEL
BUN: 17 mg/dL (ref 6–23)
CO2: 28 mEq/L (ref 19–32)
CREATININE: 0.91 mg/dL (ref 0.40–1.20)
Calcium: 10 mg/dL (ref 8.4–10.5)
Chloride: 106 mEq/L (ref 96–112)
GFR: 64.04 mL/min (ref >60.00)
GLUCOSE: 103 mg/dL — AB (ref 70–99)
Potassium: 4.2 mEq/L (ref 3.5–5.1)
Sodium: 142 mEq/L (ref 135–145)

## 2016-02-14 LAB — TSH: TSH: 1.2 u[IU]/mL (ref 0.35–4.50)

## 2016-02-20 ENCOUNTER — Other Ambulatory Visit: Payer: Self-pay | Admitting: Family Medicine

## 2016-02-20 ENCOUNTER — Ambulatory Visit (INDEPENDENT_AMBULATORY_CARE_PROVIDER_SITE_OTHER): Payer: Medicare Other

## 2016-02-20 VITALS — BP 126/70 | HR 72 | Ht 61.0 in | Wt 168.1 lb

## 2016-02-20 DIAGNOSIS — R82998 Other abnormal findings in urine: Secondary | ICD-10-CM

## 2016-02-20 DIAGNOSIS — Z0000 Encounter for general adult medical examination without abnormal findings: Secondary | ICD-10-CM | POA: Diagnosis not present

## 2016-02-20 DIAGNOSIS — D72829 Elevated white blood cell count, unspecified: Secondary | ICD-10-CM

## 2016-02-20 DIAGNOSIS — M1711 Unilateral primary osteoarthritis, right knee: Secondary | ICD-10-CM | POA: Diagnosis not present
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
## 2016-02-20 NOTE — Patient Instructions (Addendum)
[name] [name] , Thank you for taking time to come for your Medicare Wellness Visit. I appreciate your ongoing commitment to your health goals. Please review the following plan we discussed and let me know if I can assist you in the future.   Will go to [facility] to have lab Ua test completed   Can schedule mammogram when convenient  Will get your vision exam    Will start protein; either muscle milk or powder (general 3 serving ; protein level normal )   Educated to check with insurance regarding coverage of Shingles vaccination on Part D or Part B and may have lower co-pay if provided on the Part D side  Recommendations for Dexa Scan Female over the age of 14 If you broke a bone past the age of 23 Women menopausal age with risk factors (thin frame; smoker; hx of fx ) Post menopausal women under the age of 89 with risk factors A man age 54 to 44 with risk factors Other: Spine xray that is showing break of bone loss Back pain with possible break Height loss of 1/2 inch or more within one year Total loss in height of 1.5 inches from your original height  Calcium 1229m with Vit D 800u per day; more as directed by physician Strength building exercises discussed; can include walking; housework; small weights or stretch bands; silver sneakers if access to the [facility]  Will start back on fitness center    Given general resources below on community supports DM and Wt loss could not be removed due to lost computer connections. All reviewed with the patient before leaving   These are the goals we discussed: Goals    . patient          To engage more when feeling better Diabetes and weight loss; [facility]  Phone: [phone]  [place] [facility]; 38630030146Sr. [place] 3302-273-8991Get resource to get information on any and all community programs for Seniors  [place]: [phone] Community Health Response Program -[phone] Health Dept;  Need to be a skilled visit but can assist with bathing as well; [phone] Adult center for Enrichment;  Call Senior Line; [phone] Adult day services include Adult Day Care, Adult Day Healthcare, Group Respite, Care Partners, Volunteer In [place] Education and Support Program     Dept of Social Services; Call [phone] ask for SW on call  Options for Medicaid include the Community Alternatives program; [place] (personal care services) or PACE program, which is a medical and social program combined  KBraulio Contemanages the community Alternatives program at the PE. [employer] [phone](this is a program with a waiting list but provides SNF care at home;  [facility]; Call KClaiborne Billingsand she will send out packet of information   Caregiver support group and information regarding [place] at the; [facility]Address: [address] [place] [address] Phone: [phone]  HMobileCycles.plgeneral resources for food etc   Http://nihseniorhealth.giv  Deaf & Hard of Hearing Division Services - can assist with hearing aid x 1  No reviews  [address]Office  [address]  [phone] ----        This is a list of the screening recommended for you and due dates:  Health Maintenance  Topic Date Due  . Complete foot exam   01/16/1951  . Eye exam for diabetics  01/16/1951  . Shingles Vaccine  01/15/2001  . Hemoglobin A1C  03/21/2017*  . Urine Protein Check  03/21/2017*  . Tetanus Vaccine  11/20/2017  . Colon Cancer Screening  06/30/2018  . Flu Shot  Completed  . DEXA scan (bone density measurement)  Completed  . Pneumonia vaccines  Completed  *Topic was postponed. The date shown is not the original due date.        Fall Prevention in the Home  Falls can cause injuries. They can happen to people of all ages. There are many things you can do to make your home safe  and to help prevent falls.  WHAT CAN I DO ON THE OUTSIDE OF MY HOME?  Regularly fix the edges of walkways and driveways and fix any cracks.  Remove anything that might make you trip as you walk through a door, such as a raised step or threshold.  Trim any bushes or trees on the path to your home.  Use bright outdoor lighting.  Clear any walking paths of anything that might make someone trip, such as rocks or tools.  Regularly check to see if handrails are loose or broken. Make sure that both sides of any steps have handrails.  Any raised decks and porches should have guardrails on the edges.  Have any leaves, snow, or ice cleared regularly.  Use sand or salt on walking paths during winter.  Clean up any spills in your garage right away. This includes oil or grease spills. WHAT CAN I DO IN THE BATHROOM?   Use night lights.  Install grab bars by the toilet and in the tub and shower. Do not use towel bars as grab bars.  Use non-skid mats or decals in the tub or shower.  If you need to sit down in the shower, use a plastic, non-slip stool.  Keep the floor dry. Clean up any water that spills on the floor as soon as it happens.  Remove soap buildup in the tub or shower regularly.  Attach bath mats securely with double-sided non-slip rug tape.  Do not have throw rugs and other things on the floor that can make you trip. WHAT CAN I DO IN THE BEDROOM?  Use night lights.  Make sure that you have a light by your bed that is easy to reach.  Do not use any sheets or blankets that are too big for your bed. They should not hang down onto the floor.  Have a firm chair that has side arms. You can use this for support while you get dressed.  Do not have throw rugs and other things on the floor that can make you trip. WHAT CAN I DO IN THE KITCHEN?  Clean up any spills right away.  Avoid walking on wet floors.  Keep items that you use a lot in easy-to-reach places.  If you need  to reach something above you, use a strong step stool that has a grab bar.  Keep electrical cords out of the way.  Do not use floor polish or wax that makes floors slippery. If you must use wax, use non-skid floor wax.  Do not have throw rugs and other things on the floor that can make you trip. WHAT CAN I DO WITH MY STAIRS?  Do not leave any items on the stairs.  Make sure that there are handrails on both sides of the stairs and use them. Fix handrails that are broken or loose. Make sure that handrails are as  long as the stairways.  Check any carpeting to make sure that it is firmly attached to the stairs. Fix any carpet that is loose or worn.  Avoid having throw rugs at the top or bottom of the stairs. If you do have throw rugs, attach them to the floor with carpet tape.  Make sure that you have a light switch at the top of the stairs and the bottom of the stairs. If you do not have them, ask someone to add them for you. WHAT ELSE CAN I DO TO HELP PREVENT FALLS?  Wear shoes that:  Do not have high heels.  Have rubber bottoms.  Are comfortable and fit you well.  Are closed at the toe. Do not wear sandals.  If you use a stepladder:  Make sure that it is fully opened. Do not climb a closed stepladder.  Make sure that both sides of the stepladder are locked into place.  Ask someone to hold it for you, if possible.  Clearly mark and make sure that you can see:  Any grab bars or handrails.  First and last steps.  Where the edge of each step is.  Use tools that help you move around (mobility aids) if they are needed. These include:  Canes.  Walkers.  Scooters.  Crutches.  Turn on the lights when you go into a dark area. Replace any light bulbs as soon as they burn out.  Set up your furniture so you have a clear path. Avoid moving your furniture around.  If any of your floors are uneven, fix them.  If there are any pets around you, be aware of where they  are.  Review your medicines with your doctor. Some medicines can make you feel dizzy. This can increase your chance of falling. Ask your doctor what other things that you can do to help prevent falls.   This information is not intended to replace advice given to you by your health care provider. Make sure you discuss any questions you have with your health care provider.   Document Released: 02/02/2009 Document Revised: 08/23/2014 Document Reviewed: 05/13/2014 Elsevier Interactive Patient Education 2016 [place] Maintenance, Female Adopting a healthy lifestyle and getting preventive care can go a long way to promote health and wellness. Talk with your health care provider about what schedule of regular examinations is right for you. This is a good chance for you to check in with your provider about disease prevention and staying healthy. In between checkups, there are plenty of things you can do on your own. Experts have done a lot of research about which lifestyle changes and preventive measures are most likely to keep you healthy. Ask your health care provider for more information. WEIGHT AND DIET  Eat a healthy diet  Be sure to include plenty of vegetables, fruits, low-fat dairy products, and lean protein.  Do not eat a lot of foods high in solid fats, added sugars, or salt.  Get regular exercise. This is one of the most important things you can do for your health.  Most adults should exercise for at least 150 minutes each week. The exercise should increase your heart rate and make you sweat (moderate-intensity exercise).  Most adults should also do strengthening exercises at least twice a week. This is in addition to the moderate-intensity exercise.  Maintain a healthy weight  Body mass index (BMI) is a measurement that can be used to identify possible weight problems. It estimates body fat  based on height and weight. Your health care provider can help determine your BMI  and help you achieve or maintain a healthy weight.  For females 34 years of age and older:   A BMI below 18.5 is considered underweight.  A BMI of 18.5 to 24.9 is normal.  A BMI of 25 to 29.9 is considered overweight.  A BMI of 30 and above is considered obese.  Watch levels of cholesterol and blood lipids  You should start having your blood tested for lipids and cholesterol at 75 years of age, then have this test every 5 years.  You may need to have your cholesterol levels checked more often if:  Your lipid or cholesterol levels are high.  You are older than 75 years of age.  You are at high risk for heart disease.  CANCER SCREENING   Lung Cancer  Lung cancer screening is recommended for adults 87-51 years old who are at high risk for lung cancer because of a history of smoking.  A yearly low-dose CT scan of the lungs is recommended for people who:  Currently smoke.  Have quit within the past 15 years.  Have at least a 30-pack-year history of smoking. A pack year is smoking an average of one pack of cigarettes a day for 1 year.  Yearly screening should continue until it has been 15 years since you quit.  Yearly screening should stop if you develop a health problem that would prevent you from having lung cancer treatment.  Breast Cancer  Practice breast self-awareness. This means understanding how your breasts normally appear and feel.  It also means doing regular breast self-exams. Let your health care provider know about any changes, no matter how small.  If you are in your 20s or 30s, you should have a clinical breast exam (CBE) by a health care provider every 1-3 years as part of a regular health exam.  If you are 73 or older, have a CBE every year. Also consider having a breast X-ray (mammogram) every year.  If you have a family history of breast cancer, talk to your health care provider about genetic screening.  If you are at high risk for breast cancer,  talk to your health care provider about having an MRI and a mammogram every year.  Breast cancer gene (BRCA) assessment is recommended for women who have family members with BRCA-related cancers. BRCA-related cancers include:  Breast.  Ovarian.  Tubal.  Peritoneal cancers.  Results of the assessment will determine the need for genetic counseling and BRCA1 and BRCA2 testing. Cervical Cancer Your health care provider may recommend that you be screened regularly for cancer of the pelvic organs (ovaries, uterus, and vagina). This screening involves a pelvic examination, including checking for microscopic changes to the surface of your cervix (Pap test). You may be encouraged to have this screening done every 3 years, beginning at age 59.  For women ages 92-65, health care providers may recommend pelvic exams and Pap testing every 3 years, or they may recommend the Pap and pelvic exam, combined with testing for human papilloma virus (HPV), every 5 years. Some types of HPV increase your risk of cervical cancer. Testing for HPV may also be done on women of any age with unclear Pap test results.  Other health care providers may not recommend any screening for nonpregnant women who are considered low risk for pelvic cancer and who do not have symptoms. Ask your health care provider if a screening pelvic  exam is right for you.  If you have had past treatment for cervical cancer or a condition that could lead to cancer, you need Pap tests and screening for cancer for at least 20 years after your treatment. If Pap tests have been discontinued, your risk factors (such as having a new sexual partner) need to be reassessed to determine if screening should resume. Some women have medical problems that increase the chance of getting cervical cancer. In these cases, your health care provider may recommend more frequent screening and Pap tests. Colorectal Cancer  This type of cancer can be detected and often  prevented.  Routine colorectal cancer screening usually begins at 75 years of age and continues through 75 years of age.  Your health care provider may recommend screening at an earlier age if you have risk factors for colon cancer.  Your health care provider may also recommend using home test kits to check for hidden blood in the stool.  A small camera at the end of a tube can be used to examine your colon directly (sigmoidoscopy or colonoscopy). This is done to check for the earliest forms of colorectal cancer.  Routine screening usually begins at age 11.  Direct examination of the colon should be repeated every 5-10 years through 75 years of age. However, you may need to be screened more often if early forms of precancerous polyps or small growths are found. Skin Cancer  Check your skin from head to toe regularly.  Tell your health care provider about any new moles or changes in moles, especially if there is a change in a mole's shape or color.  Also tell your health care provider if you have a mole that is larger than the size of a pencil eraser.  Always use sunscreen. Apply sunscreen liberally and repeatedly throughout the day.  Protect yourself by wearing long sleeves, pants, a wide-brimmed hat, and sunglasses whenever you are outside. HEART DISEASE, DIABETES, AND HIGH BLOOD PRESSURE   High blood pressure causes heart disease and increases the risk of stroke. High blood pressure is more likely to develop in:  People who have blood pressure in the high end of the normal range (130-139/85-89 mm Hg).  People who are overweight or obese.  People who are African American.  If you are 27-79 years of age, have your blood pressure checked every 3-5 years. If you are 89 years of age or older, have your blood pressure checked every year. You should have your blood pressure measured twice--once when you are at a hospital or clinic, and once when you are not at a hospital or clinic.  Record the average of the two measurements. To check your blood pressure when you are not at a hospital or clinic, you can use:  An automated blood pressure machine at a pharmacy.  A home blood pressure monitor.  If you are between 66 years and 18 years old, ask your health care provider if you should take aspirin to prevent strokes.  Have regular diabetes screenings. This involves taking a blood sample to check your fasting blood sugar level.  If you are at a normal weight and have a low risk for diabetes, have this test once every three years after 75 years of age.  If you are overweight and have a high risk for diabetes, consider being tested at a younger age or more often. PREVENTING INFECTION  Hepatitis B  If you have a higher risk for hepatitis B, you should be  screened for this virus. You are considered at high risk for hepatitis B if:  You were born in a country where hepatitis B is common. Ask your health care provider which countries are considered high risk.  Your parents were born in a high-risk country, and you have not been immunized against hepatitis B (hepatitis B vaccine).  You have HIV or AIDS.  You use needles to inject street drugs.  You live with someone who has hepatitis B.  You have had sex with someone who has hepatitis B.  You get hemodialysis treatment.  You take certain medicines for conditions, including cancer, organ transplantation, and autoimmune conditions. Hepatitis C  Blood testing is recommended for:  Everyone born from [birth year] through 1965.  Anyone with known risk factors for hepatitis C. Sexually transmitted infections (STIs)  You should be screened for sexually transmitted infections (STIs) including gonorrhea and chlamydia if:  You are sexually active and are younger than 75 years of age.  You are older than 75 years of age and your health care provider tells you that you are at risk for this type of infection.  Your sexual activity  has changed since you were last screened and you are at an increased risk for chlamydia or gonorrhea. Ask your health care provider if you are at risk.  If you do not have HIV, but are at risk, it may be recommended that you take a prescription medicine daily to prevent HIV infection. This is called pre-exposure prophylaxis (PrEP). You are considered at risk if:  You are sexually active and do not regularly use condoms or know the HIV status of your partner(s).  You take drugs by injection.  You are sexually active with a partner who has HIV. Talk with your health care provider about whether you are at high risk of being infected with HIV. If you choose to begin PrEP, you should first be tested for HIV. You should then be tested every 3 months for as long as you are taking PrEP.  PREGNANCY   If you are premenopausal and you may become pregnant, ask your health care provider about preconception counseling.  If you may become pregnant, take 400 to 800 micrograms (mcg) of folic acid every day.  If you want to prevent pregnancy, talk to your health care provider about birth control (contraception). OSTEOPOROSIS AND MENOPAUSE   Osteoporosis is a disease in which the bones lose minerals and strength with aging. This can result in serious bone fractures. Your risk for osteoporosis can be identified using a bone density scan.  If you are 59 years of age or older, or if you are at risk for osteoporosis and fractures, ask your health care provider if you should be screened.  Ask your health care provider whether you should take a calcium or vitamin D supplement to lower your risk for osteoporosis.  Menopause may have certain physical symptoms and risks.  Hormone replacement therapy may reduce some of these symptoms and risks. Talk to your health care provider about whether hormone replacement therapy is right for you.  HOME CARE INSTRUCTIONS   Schedule regular health, dental, and eye  exams.  Stay current with your immunizations.   Do not use any tobacco products including cigarettes, chewing tobacco, or electronic cigarettes.  If you are pregnant, do not drink alcohol.  If you are breastfeeding, limit how much and how often you drink alcohol.  Limit alcohol intake to no more than 1 drink per day for nonpregnant  women. One drink equals 12 ounces of beer, 5 ounces of wine, or 1 ounces of hard liquor.  Do not use street drugs.  Do not share needles.  Ask your health care provider for help if you need support or information about quitting drugs.  Tell your health care provider if you often feel depressed.  Tell your health care provider if you have ever been abused or do not feel safe at home.   This information is not intended to replace advice given to you by your health care provider. Make sure you discuss any questions you have with your health care provider.   Document Released: 10/22/2010 Document Revised: 04/29/2014 Document Reviewed: 03/10/2013 Elsevier Interactive Patient Education [employer].

## 2016-02-23 ENCOUNTER — Other Ambulatory Visit (HOSPITAL_COMMUNITY): Payer: Self-pay | Admitting: Ophthalmology

## 2016-02-23 ENCOUNTER — Ambulatory Visit (HOSPITAL_COMMUNITY)
Admission: RE | Admit: 2016-02-23 | Discharge: 2016-02-23 | Disposition: A | Discharge status: Home / Self Care | Payer: Medicare Other | Source: Ambulatory Visit | Attending: Cardiology | Admitting: Cardiology

## 2016-02-23 ENCOUNTER — Other Ambulatory Visit (HOSPITAL_COMMUNITY): Payer: Self-pay | Admitting: Specialist

## 2016-02-23 DIAGNOSIS — S5011XA Contusion of right forearm, initial encounter: Secondary | ICD-10-CM | POA: Diagnosis not present

## 2016-02-23 DIAGNOSIS — Z96651 Presence of right artificial knee joint: Secondary | ICD-10-CM | POA: Diagnosis not present

## 2016-02-23 DIAGNOSIS — M79604 Pain in right leg: Secondary | ICD-10-CM | POA: Insufficient documentation

## 2016-02-23 DIAGNOSIS — Z96611 Presence of right artificial shoulder joint: Secondary | ICD-10-CM | POA: Diagnosis not present

## 2016-02-24 ENCOUNTER — Other Ambulatory Visit: Payer: Self-pay | Admitting: Internal Medicine

## 2016-03-01 DIAGNOSIS — M1711 Unilateral primary osteoarthritis, right knee: Secondary | ICD-10-CM | POA: Diagnosis not present

## 2016-03-04 DIAGNOSIS — M1711 Unilateral primary osteoarthritis, right knee: Secondary | ICD-10-CM | POA: Diagnosis not present

## 2016-03-08 DIAGNOSIS — M1711 Unilateral primary osteoarthritis, right knee: Secondary | ICD-10-CM | POA: Diagnosis not present

## 2016-03-11 DIAGNOSIS — Z96651 Presence of right artificial knee joint: Secondary | ICD-10-CM | POA: Diagnosis not present

## 2016-03-11 DIAGNOSIS — Z471 Aftercare following joint replacement surgery: Secondary | ICD-10-CM | POA: Diagnosis not present

## 2016-03-12 DIAGNOSIS — M1711 Unilateral primary osteoarthritis, right knee: Secondary | ICD-10-CM | POA: Diagnosis not present

## 2016-03-18 DIAGNOSIS — M1711 Unilateral primary osteoarthritis, right knee: Secondary | ICD-10-CM | POA: Diagnosis not present

## 2016-03-20 DIAGNOSIS — M1711 Unilateral primary osteoarthritis, right knee: Secondary | ICD-10-CM | POA: Diagnosis not present

## 2016-03-22 DIAGNOSIS — M1711 Unilateral primary osteoarthritis, right knee: Secondary | ICD-10-CM | POA: Diagnosis not present

## 2016-03-25 DIAGNOSIS — M1711 Unilateral primary osteoarthritis, right knee: Secondary | ICD-10-CM | POA: Diagnosis not present

## 2016-03-27 DIAGNOSIS — M1711 Unilateral primary osteoarthritis, right knee: Secondary | ICD-10-CM | POA: Diagnosis not present

## 2016-07-07 ENCOUNTER — Other Ambulatory Visit: Payer: Self-pay | Admitting: Internal Medicine

## 2016-07-09 ENCOUNTER — Telehealth: Payer: Self-pay | Admitting: Emergency Medicine

## 2016-07-09 NOTE — Telephone Encounter (Signed)
error 

## 2016-07-23 NOTE — Progress Notes (Signed)
Chief Complaint  Patient presents with  . Annual Exam    HPI: Patient  [name] [name]  75 y.o. comes in today for Preventive Health Care visit exdam anad Chronic disease management HT: meds no problem seems controlled LIPDS: no se of meds DJD and FM  Coping  Had recent sinus infection rx  Hearing vision ok  Mood ok  On meds  Only take pain med as needed  On ppi needs refill Has helped to lose weight  No recnet  dexa   Over 5 years no fracture s    Health Maintenance  Topic Date Due  . URINE MICROALBUMIN  03/21/2017 (Originally 01/16/1951)  . INFLUENZA VACCINE  11/20/2016  . TETANUS/TDAP  11/20/2017  . COLONOSCOPY  06/30/2018  . DEXA SCAN  Completed  . 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   ROS:  Had eisode ofn back pain different that reg and the gi sx constipationa nd then diarrhea and then resolved  No fever no obv cause  Stopped all the vitamins in case  GEN/ HEENT: No fever, significant weight changes sweats headaches vision problems hearing changes, CV/ PULM; No chest pain shortness of breath cough, syncope,edema  change in exercise tolerance. GI /GU: No adominal pain, vomiting, change in bowel habits. No blood in the stool. No significant GU symptoms. SKIN/HEME: ,no acute skin rashes suspicious lesions or bleeding. No lymphadenopathy, nodules, masses.  NEURO/ PSYCH:  No neurologic signs such as weakness numbness. No  New depression anxiety. stable IMM/ Allergy: No unusual infections.  Allergy .   REST of 12 system review negative except as per HPI   Past Medical History:  Diagnosis Date  . Acute kidney failure ([place])    5 years ago  . ACUTE KIDNEY FAILURE UNSPECIFIED 07/12/2009  . Allergy     . Anxiety   . Arthritis   . CAD (coronary artery disease)    Mild nonobstructive by CT  . 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 Cataract   . Chronic kidney disease    stones  . Depression   . Diverticulosis of colon (without mention of hemorrhage)   . Fibromyalgia   . GERD (gastroesophageal reflux disease)   . Hiatal hernia   . Hyperlipidemia   . Hypertension   . Osteopenia   . PONV (postoperative nausea and vomiting)   . 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 BLADDER TUMOR EXCISION  2001  . CATARACT EXTRACTION     bilateral  . CHOLECYSTECTOMY    . CYSTECTOMY  2001   benign  . ESOPHAGEAL MANOMETRY N/A 07/13/2012   Procedure: ESOPHAGEAL MANOMETRY (EM);  Surgeon: Sable Feil, MD;  Location: [facility];  Service: Endoscopy;  Laterality: N/A;  . LUMBAR DISC SURGERY     x 2 elsner  . REVERSE SHOULDER ARTHROPLASTY Right 08/12/2014   Procedure: RIGHT REVERSE TOTAL SHOULDER ARTHROPLASTY;  Surgeon: Netta Cedars, MD;  Location: [facility];  Service: Orthopedics;  Laterality: Right;  .  TENDON REPAIR  11/2005   right shoulder  . TOTAL KNEE ARTHROPLASTY     left  . TOTAL KNEE ARTHROPLASTY Right 11/17/2015   Procedure: RIGHT TOTAL KNEE ARTHROPLASTY;  Surgeon: Sydnee Cabal, MD;  Location: [facility];  Service: Orthopedics;  Laterality: Right;    Family History  Problem Relation Age of Onset  . Alzheimer's disease Mother   . Lung cancer Father     tobacco  . Kidney cancer Son   . Hypertension Brother     mom's side  . Osteoporosis Sister   . Breast cancer Cousin   . Heart disease Maternal Uncle     x 4  . Heart disease Maternal Aunt     x 4  . Diabetes Paternal Grandmother   . Diabetes Paternal Uncle     x 2  . Colon cancer Neg Hx     Social History   Social History  . Marital status: Widowed    Spouse name: N/A  . Number of children: 2  . Years of education: N/A    Occupational History  . Retired     Social History Main Topics  . Smoking status: Never Smoker  . Smokeless tobacco: Never Used  . Alcohol use 0.0 oz/week     Comment: rare  . Drug use: No  . Sexual activity: Not Asked   Other Topics Concern  . 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 Cyanocobalamin (B-12 PO) Take 500 mcg by mouth daily. Reported on 05/29/2015    . docusate sodium (COLACE) 100 MG capsule Take 100 mg by mouth 2 (two) times daily.    Marland Kitchen HYDROmorphone (DILAUDID) 2 MG tablet Take 2-4 mg by mouth every 4 (four) hours as needed for moderate pain or severe pain.    . polyethylene glycol powder (MIRALAX) powder Take 17 g by mouth daily as needed for mild constipation.     . Probiotic Product (ALIGN PO) Take 1 tablet by mouth daily.    . Protein (PROCEL) POWD Take 2 scoop by mouth 2 (two) times daily.    Marland Kitchen albuterol (PROVENTIL HFA;VENTOLIN HFA) 108 (90 BASE) MCG/ACT inhaler Inhale 2 puffs into the lungs every 6 (six) hours as needed for wheezing or shortness of breath. 1 Inhaler 2  . amLODipine (NORVASC) 5 MG tablet TAKE 1 TABLET BY MOUTH EVERY DAY 90 tablet 0  . baclofen (LIORESAL) 10 MG tablet Take 1 tablet (10 mg total) by mouth 3 (three) times daily as needed for muscle spasms. 50 each 2  . DULoxetine (CYMBALTA) 60 MG capsule TAKE ONE CAPSULE BY MOUTH TWICE A DAY 60 capsule 2  . metoprolol (LOPRESSOR) 50 MG tablet TAKE 0.5 TABLETS (25 MG TOTAL) BY MOUTH 2 (TWO) TIMES DAILY. 90 tablet 1  . omeprazole (PRILOSEC) 20 MG capsule TAKE ONE CAPSULE BY MOUTH TWICE A DAY 180 capsule 0  . rosuvastatin (CRESTOR) 40 MG tablet Take 1 tablet (40 mg total) by mouth daily. 30 tablet 2  . traMADol (ULTRAM) 50 MG tablet Take 1-2 tablets (50-100 mg total) by mouth at bedtime as needed for moderate pain or severe pain. 60 tablet 5   No  facility-administered medications prior to visit.      EXAM:  BP 110/70 (BP Location: Right Arm, Patient Position: Sitting, Cuff Size: Normal)   Pulse 67  Temp 98.2 F (36.8 C) (Oral)   Ht 5' (1.524 m)   Wt 159 lb 14.4 oz (72.5 kg)   BMI 31.23 kg/m   Body mass index is 31.23 kg/m. Wt Readings from Last 3 Encounters:  07/24/16 159 lb 14.4 oz (72.5 kg)  02/20/16 168 lb 2 oz (76.3 kg)  02/13/16 169 lb 6.4 oz (76.8 kg)    Physical Exam: Vital signs reviewed IFO:YDXA is a well-developed well-nourished alert cooperative    who appearsr stated age in no acute distress.  HEENT: normocephalic atraumatic , Eyes: PERRL EOM's full, conjunctiva clear, Nares: paten,t no deformity discharge or tenderness., Ears: no deformity EAC's clear TMs with normal landmarks. Mouth: clear OP, no lesions, edema.  Moist mucous membranes. Dentition in adequate repair. NECK: supple without masses, thyromegaly or bruits. CHEST/PULM:  Clear to auscultation and percussion breath sounds equal no wheeze , rales or rhonchi. No chest wall deformities or tenderness. Breast: normal by inspection . No dimpling, discharge, masses, tenderness or discharge . CV: PMI is nondisplaced, S1 S2 no gallops, murmurs, rubs. Peripheral pulses are full without delay.No JVD .  ABDOMEN: Bowel sounds normal nontender  No guard or rebound, no hepato splenomegal no CVA tenderness.  No hernia. Extremtities:  No clubbing cyanosis or edema, no acute joint swelling or redness no focal atrophy  djd  Deformity changes noted no redness  Has boutonier deform of pinky  NEURO:  Oriented x3, cranial nerves 3-12 appear to be intact, no obvious focal weakness,gait within normal limits no abnormal reflexes or asymmetrical SKIN: No acute rashes normal turgor, color, no bruising or petechiae. PSYCH: Oriented, good eye contact, no obvious depression anxiety, cognition and judgment appear normal. LN: no cervical axillary inguinal adenopathy  Lab Results    Component Value Date   WBC 5.5 07/24/2016   HGB 13.4 07/24/2016   HCT 40.4 07/24/2016   PLT 172.0 07/24/2016   GLUCOSE 105 (H) 07/24/2016   CHOL 138 07/24/2016   TRIG 67.0 07/24/2016   HDL 58.90 07/24/2016   LDLDIRECT 179.1 10/09/2007   LDLCALC 66 07/24/2016   ALT 11 07/24/2016   AST 16 07/24/2016   NA 143 07/24/2016   K 4.1 07/24/2016   CL 107 07/24/2016   CREATININE 0.95 07/24/2016   BUN 14 07/24/2016   CO2 31 07/24/2016   TSH 1.98 07/24/2016   INR 0.98 11/10/2015   HGBA1C 5.7 07/24/2016    BP Readings from Last 3 Encounters:  07/24/16 110/70  02/20/16 126/70  02/13/16 136/86    Lab results reviewed with patient   ASSESSMENT AND PLAN:  Discussed the following assessment and plan:  Visit for preventive health examination - Plan: Basic metabolic panel, CBC with Differential/Platelet, Hemoglobin A1c, Hepatic function panel, Lipid panel, TSH, POCT urinalysis dipstick  Hyperlipidemia, unspecified hyperlipidemia type - Plan: Basic metabolic panel, CBC with Differential/Platelet, Hemoglobin A1c, Hepatic function panel, Lipid panel, TSH, POCT urinalysis dipstick  Essential hypertension - Plan: Basic metabolic panel, CBC with Differential/Platelet, Hemoglobin A1c, Hepatic function panel, Lipid panel, TSH, POCT urinalysis dipstick  Chronic kidney disease, unspecified CKD stage - Plan: Basic metabolic panel, CBC with Differential/Platelet, Hemoglobin A1c, Hepatic function panel, Lipid panel, TSH, POCT urinalysis dipstick  Medication management - Plan: Basic metabolic panel, CBC with Differential/Platelet, Hemoglobin A1c, Hepatic function panel, Lipid panel, TSH, POCT urinalysis dipstick  Urinary frequency - hx utis  - Plan: Basic metabolic panel, CBC with Differential/Platelet, Hemoglobin A1c, Hepatic function panel, Lipid panel, TSH, POCT urinalysis dipstick  Primary osteoarthritis involving multiple joints -  Plan: Basic metabolic panel, CBC with Differential/Platelet,  Hemoglobin A1c, Hepatic function panel, Lipid panel, TSH, POCT urinalysis dipstick  Fasting hyperglycemia - Plan: Basic metabolic panel, CBC with Differential/Platelet, Hemoglobin A1c, Hepatic function panel, Lipid panel, TSH, POCT urinalysis dipstick  Estrogen deficiency - Plan: DG Bone Density  Abnormal urine - Plan: Urine culture Hx utis    has frequency can check ua and cx if abnormal  Can begin back vitamins one at a time to avoid se poss  Patient Care Team: Burnis Medin, MD as PCP - General Sydnee Cabal, MD as Attending Physician (Orthopedic Surgery) Suella Broad, MD (Physical Medicine and Rehabilitation) Clent Jacks, MD as Consulting Physician (Ophthalmology) Patient Instructions  Continue lifestyle intervention healthy eating and exercise . Will notify you  of labs when available. Make sure taking vitamin D  Supplement    If not bothering your stomach    get  Bone density  To check risk of fracture .  I put in order and you make  appt for this at your convenience   If all ok then ROV uin 6 months and we can check kidney function or as needed          Mariann Laster K. Panosh M.D.

## 2016-07-24 ENCOUNTER — Ambulatory Visit (INDEPENDENT_AMBULATORY_CARE_PROVIDER_SITE_OTHER): Payer: PPO | Admitting: Internal Medicine

## 2016-07-24 ENCOUNTER — Encounter: Payer: Self-pay | Admitting: Internal Medicine

## 2016-07-24 VITALS — BP 110/70 | HR 67 | Temp 98.2°F | Ht 60.0 in | Wt 159.9 lb

## 2016-07-24 DIAGNOSIS — E785 Hyperlipidemia, unspecified: Secondary | ICD-10-CM | POA: Diagnosis not present

## 2016-07-24 DIAGNOSIS — E2839 Other primary ovarian failure: Secondary | ICD-10-CM

## 2016-07-24 DIAGNOSIS — M8949 Other hypertrophic osteoarthropathy, multiple sites: Secondary | ICD-10-CM

## 2016-07-24 DIAGNOSIS — R8290 Unspecified abnormal findings in urine: Secondary | ICD-10-CM

## 2016-07-24 DIAGNOSIS — I10 Essential (primary) hypertension: Secondary | ICD-10-CM | POA: Diagnosis not present

## 2016-07-24 DIAGNOSIS — Z79899 Other long term (current) drug therapy: Secondary | ICD-10-CM | POA: Diagnosis not present

## 2016-07-24 DIAGNOSIS — R7301 Impaired fasting glucose: Secondary | ICD-10-CM

## 2016-07-24 DIAGNOSIS — Z0000 Encounter for general adult medical examination without abnormal findings: Secondary | ICD-10-CM

## 2016-07-24 DIAGNOSIS — M159 Polyosteoarthritis, unspecified: Secondary | ICD-10-CM

## 2016-07-24 DIAGNOSIS — N189 Chronic kidney disease, unspecified: Secondary | ICD-10-CM

## 2016-07-24 DIAGNOSIS — M150 Primary generalized (osteo)arthritis: Secondary | ICD-10-CM | POA: Diagnosis not present

## 2016-07-24 DIAGNOSIS — R350 Frequency of micturition: Secondary | ICD-10-CM

## 2016-07-24 LAB — CBC WITH DIFFERENTIAL/PLATELET
BASOS PCT: 1 % (ref 0.0–3.0)
Basophils Absolute: 0.1 10*3/uL (ref 0.0–0.1)
EOS PCT: 2.7 % (ref 0.0–5.0)
Eosinophils Absolute: 0.2 10*3/uL (ref 0.0–0.7)
HEMATOCRIT: 40.4 % (ref 36.0–46.0)
HEMOGLOBIN: 13.4 g/dL (ref 12.0–15.0)
Lymphocytes Relative: 18 % (ref 12.0–46.0)
Lymphs Abs: 1 10*3/uL (ref 0.7–4.0)
MCHC: 33.2 g/dL (ref 30.0–36.0)
MCV: 87.6 fl (ref 78.0–100.0)
Monocytes Absolute: 0.3 10*3/uL (ref 0.1–1.0)
Monocytes Relative: 5.8 % (ref 3.0–12.0)
Neutro Abs: 4 10*3/uL (ref 1.4–7.7)
Neutrophils Relative %: 72.5 % (ref 43.0–77.0)
Platelets: 172 10*3/uL (ref 150.0–400.0)
RBC: 4.61 Mil/uL (ref 3.87–5.11)
RDW: 14.3 % (ref 11.5–15.5)
WBC: 5.5 10*3/uL (ref 4.0–10.5)

## 2016-07-24 LAB — BASIC METABOLIC PANEL
BUN: 14 mg/dL (ref 6–23)
CHLORIDE: 107 meq/L (ref 96–112)
CO2: 31 mEq/L (ref 19–32)
Calcium: 9.3 mg/dL (ref 8.4–10.5)
Creatinine, Ser: 0.95 mg/dL (ref 0.40–1.20)
GFR: 60.87 mL/min (ref >60.00)
Glucose, Bld: 105 mg/dL — ABNORMAL HIGH (ref 70–99)
POTASSIUM: 4.1 meq/L (ref 3.5–5.1)
SODIUM: 143 meq/L (ref 135–145)

## 2016-07-24 LAB — LIPID PANEL
CHOLESTEROL: 138 mg/dL (ref 0–200)
HDL: 58.9 mg/dL (ref >39.00)
LDL Cholesterol: 66 mg/dL (ref 0–99)
NonHDL: 79.09
Total CHOL/HDL Ratio: 2
Triglycerides: 67 mg/dL (ref 0.0–149.0)
VLDL: 13.4 mg/dL (ref 0.0–40.0)

## 2016-07-24 LAB — POCT URINALYSIS DIPSTICK
Bilirubin, UA: NEGATIVE
Blood, UA: NEGATIVE
Glucose, UA: NEGATIVE
KETONES UA: NEGATIVE
NITRITE UA: NEGATIVE
PH UA: 6 (ref 5.0–8.0)
PROTEIN UA: NEGATIVE
Spec Grav, UA: 1.03 (ref 1.030–1.035)
Urobilinogen, UA: 0.2 (ref ?–2.0)

## 2016-07-24 LAB — HEPATIC FUNCTION PANEL
ALBUMIN: 4 g/dL (ref 3.5–5.2)
ALT: 11 U/L (ref 0–35)
AST: 16 U/L (ref 0–37)
Alkaline Phosphatase: 55 U/L (ref 39–117)
Bilirubin, Direct: 0.1 mg/dL (ref 0.0–0.3)
TOTAL PROTEIN: 6.5 g/dL (ref 6.0–8.3)
Total Bilirubin: 0.6 mg/dL (ref 0.2–1.2)

## 2016-07-24 LAB — TSH: TSH: 1.98 u[IU]/mL (ref 0.35–4.50)

## 2016-07-24 LAB — HEMOGLOBIN A1C: Hgb A1c MFr Bld: 5.7 % (ref 4.6–6.5)

## 2016-07-24 MED ORDER — OMEPRAZOLE 20 MG PO CPDR: 20.0000 mg | DELAYED_RELEASE_CAPSULE | Freq: Two times a day (BID) | ORAL | 1 refills | Status: DC

## 2016-07-24 MED ORDER — DULOXETINE HCL 60 MG PO CPEP: 60.0000 mg | ORAL_CAPSULE | Freq: Two times a day (BID) | ORAL | 5 refills | Status: DC

## 2016-07-24 MED ORDER — BACLOFEN 10 MG PO TABS: 10.0000 mg | ORAL_TABLET | Freq: Three times a day (TID) | ORAL | 0 refills | Status: DC | PRN

## 2016-07-24 MED ORDER — ROSUVASTATIN CALCIUM 40 MG PO TABS: 40.0000 mg | ORAL_TABLET | Freq: Every day | ORAL | 1 refills | Status: DC

## 2016-07-24 MED ORDER — AMLODIPINE BESYLATE 5 MG PO TABS: 5.0000 mg | ORAL_TABLET | Freq: Every day | ORAL | 3 refills | Status: DC

## 2016-07-24 MED ORDER — METOPROLOL TARTRATE 50 MG PO TABS: ORAL_TABLET | ORAL | 3 refills | Status: DC

## 2016-07-24 MED ORDER — TRAMADOL HCL 50 MG PO TABS: 50.0000 mg | ORAL_TABLET | Freq: Every evening | ORAL | 0 refills | Status: DC | PRN

## 2016-07-24 NOTE — Patient Instructions (Addendum)
Continue lifestyle intervention healthy eating and exercise . Will notify you  of labs when available. Make sure taking vitamin D  Supplement    If not bothering your stomach    get  Bone density  To check risk of fracture .  I put in order and you make  appt for this at your convenience   If all ok then ROV uin 6 months and we can check kidney function or as needed

## 2016-07-25 LAB — URINE CULTURE: ORGANISM ID, BACTERIA: NO GROWTH

## 2016-07-30 DIAGNOSIS — H01024 Squamous blepharitis left upper eyelid: Secondary | ICD-10-CM | POA: Diagnosis not present

## 2016-07-30 DIAGNOSIS — H01022 Squamous blepharitis right lower eyelid: Secondary | ICD-10-CM | POA: Diagnosis not present

## 2016-07-30 DIAGNOSIS — H00022 Hordeolum internum right lower eyelid: Secondary | ICD-10-CM | POA: Diagnosis not present

## 2016-07-30 DIAGNOSIS — H01025 Squamous blepharitis left lower eyelid: Secondary | ICD-10-CM | POA: Diagnosis not present

## 2016-07-30 DIAGNOSIS — H04123 Dry eye syndrome of bilateral lacrimal glands: Secondary | ICD-10-CM | POA: Diagnosis not present

## 2016-07-30 DIAGNOSIS — H01021 Squamous blepharitis right upper eyelid: Secondary | ICD-10-CM | POA: Diagnosis not present

## 2016-07-30 DIAGNOSIS — H10413 Chronic giant papillary conjunctivitis, bilateral: Secondary | ICD-10-CM | POA: Diagnosis not present

## 2016-07-30 DIAGNOSIS — Z961 Presence of intraocular lens: Secondary | ICD-10-CM | POA: Diagnosis not present

## 2016-07-30 DIAGNOSIS — H16143 Punctate keratitis, bilateral: Secondary | ICD-10-CM | POA: Diagnosis not present

## 2016-08-05 ENCOUNTER — Telehealth: Payer: Self-pay | Admitting: Internal Medicine

## 2016-08-05 NOTE — Telephone Encounter (Signed)
Pt would like a copy of her last labs mailed to her home address.

## 2016-08-06 NOTE — Telephone Encounter (Signed)
Copy is mailed out nothing further needed

## 2016-09-17 ENCOUNTER — Other Ambulatory Visit: Payer: Self-pay | Admitting: Emergency Medicine

## 2016-09-17 MED ORDER — ROSUVASTATIN CALCIUM 40 MG PO TABS: 40.0000 mg | ORAL_TABLET | Freq: Every day | ORAL | 1 refills | Status: DC

## 2016-09-21 ENCOUNTER — Other Ambulatory Visit: Payer: Self-pay | Admitting: Internal Medicine

## 2016-09-23 NOTE — Telephone Encounter (Signed)
Denied.  Filled on 07/24/16 for 1 year.  Message sent to the pharmacy to check file.

## 2017-04-30 ENCOUNTER — Other Ambulatory Visit: Payer: Self-pay | Admitting: Internal Medicine

## 2017-04-30 ENCOUNTER — Telehealth: Payer: Self-pay | Admitting: Internal Medicine

## 2017-04-30 NOTE — Telephone Encounter (Signed)
Copied from [place] [phone]. Topic: Quick Communication - See Telephone Encounter >> Apr 30, 2017 10:15 AM Conception Chancy, NT wrote: CRM for notification. See Telephone encounter for:  04/30/17.  [facility] is [facility] and requesting a 90 day supply refill on rosuvastatin. Please advise.

## 2017-05-01 ENCOUNTER — Other Ambulatory Visit: Payer: Self-pay | Admitting: Internal Medicine

## 2017-05-01 NOTE — Telephone Encounter (Signed)
Medication filled to pharmacy as requested.  Called patient and scheduled follow-up appt (she is overdue).  She also notes some possible mucous in her urine for the last 7-10 days. Denies fever, abd pain, and bleeding.  Offered sooner acute appt, but patient would like to see PCP for follow-up as scheduled.  She will call back if any worsening symptoms prior to follow-up.

## 2017-05-02 NOTE — Telephone Encounter (Signed)
Filled 05/01/17 Nothing further needed.

## 2017-05-08 NOTE — Progress Notes (Deleted)
No chief complaint on file.   HPI: [name] [name] 77 y.o. come in for Chronic disease management  Last pv was 4 18      ROS: See pertinent positives and negatives per HPI.  Past Medical History:  Diagnosis Date  . Acute kidney failure ([place])    5 years ago  . ACUTE KIDNEY FAILURE UNSPECIFIED 07/12/2009  . Allergy   . Anxiety   . Arthritis   . CAD (coronary artery disease)    Mild nonobstructive by CT  . 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 Cataract   . Chronic kidney disease    stones  . Depression   . Diverticulosis of colon (without mention of hemorrhage)   . Fibromyalgia   . GERD (gastroesophageal reflux disease)   . Hiatal hernia   . Hyperlipidemia   . Hypertension   . Osteopenia   . PONV (postoperative nausea and vomiting)   . 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 Alzheimer's disease Mother   . Lung cancer Father        tobacco  . Kidney cancer Son   . Hypertension Brother        mom's side  . Osteoporosis Sister   . Breast cancer Cousin   . Heart disease Maternal Uncle        x 4  . Heart disease Maternal Aunt        x 4  . Diabetes Paternal Grandmother   . Diabetes Paternal Uncle        x 2  . Colon cancer Neg Hx     Social History   Socioeconomic History  . Marital status: Widowed    Spouse name: Not on file  . Number of children: 2  . Years of education: Not on file  . Highest education level: Not on file  Social Needs  . Financial resource strain: Not on file  . Food insecurity - worry: Not on file  . Food insecurity - inability: Not on file  . Transportation needs - medical: Not on file  . Transportation needs - non-medical: Not on file  Occupational History  . Occupation: Retired   Tobacco Use  . Smoking status: Never Smoker  . Smokeless tobacco: Never Used  Substance and Sexual Activity  . Alcohol use: Yes    Alcohol/week: 0.0 oz     Comment: rare  . Drug use: No  . Sexual activity: Not on file  Other Topics Concern  . 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 amLODipine (NORVASC) 5 MG tablet Take 1 tablet (5 mg total) by mouth daily. 90 tablet 3  . baclofen (LIORESAL) 10 MG tablet Take 1 tablet (10 mg total) by mouth 3 (three) times daily as needed for muscle spasms. 50 each 0  . Cyanocobalamin (B-12 PO) Take 500 mcg by mouth daily. Reported on 05/29/2015    . docusate sodium (COLACE) 100 MG capsule Take 100 mg by mouth 2 (two) times daily.    . DULoxetine (CYMBALTA) 60 MG capsule TAKE 1 CAPSULE (60 MG TOTAL) BY MOUTH 2 (TWO) TIMES DAILY. 60 capsule 2  . HYDROmorphone (DILAUDID) 2 MG tablet Take 2-4  mg by mouth every 4 (four) hours as needed for moderate pain or severe pain.    . metoprolol (LOPRESSOR) 50 MG tablet TAKE 0.5 TABLETS (25 MG TOTAL) BY MOUTH 2 (TWO) TIMES DAILY. 90 tablet 3  . omeprazole (PRILOSEC) 20 MG capsule TAKE 1 CAPSULE (20 MG TOTAL) BY MOUTH 2 (TWO) TIMES DAILY. 180 capsule 0  . polyethylene glycol powder (MIRALAX) powder Take 17 g by mouth daily as needed for mild constipation.     . Probiotic Product (ALIGN PO) Take 1 tablet by mouth daily.    . Protein (PROCEL) POWD Take 2 scoop by mouth 2 (two) times daily.    . rosuvastatin (CRESTOR) 40 MG tablet TAKE 1 TABLET BY MOUTH EVERY DAY 90 tablet 0  . traMADol (ULTRAM) 50 MG tablet Take 1-2 tablets (50-100 mg total) by mouth at bedtime as needed for moderate pain or severe pain. 60 tablet 0   No facility-administered medications prior to visit.      EXAM:  There were no vitals taken for this visit.  There is no height or weight on file to calculate BMI.  GENERAL: vitals reviewed and listed above, alert, oriented, appears well hydrated and in no acute distress HEENT: atraumatic,  conjunctiva  clear, no obvious abnormalities on inspection of external nose and ears OP : no lesion edema or exudate  NECK: no obvious masses on inspection palpation  LUNGS: clear to auscultation bilaterally, no wheezes, rales or rhonchi, good air movement CV: HRRR, no clubbing cyanosis or  peripheral edema nl cap refill  MS: moves all extremities without noticeable focal  abnormality PSYCH: pleasant and cooperative, no obvious depression or anxiety Lab Results  Component Value Date   WBC 5.5 07/24/2016   HGB 13.4 07/24/2016   HCT 40.4 07/24/2016   PLT 172.0 07/24/2016   GLUCOSE 105 (H) 07/24/2016   CHOL 138 07/24/2016   TRIG 67.0 07/24/2016   HDL 58.90 07/24/2016   LDLDIRECT 179.1 10/09/2007   LDLCALC 66 07/24/2016   ALT 11 07/24/2016   AST 16 07/24/2016   NA 143 07/24/2016   K 4.1 07/24/2016   CL 107 07/24/2016   CREATININE 0.95 07/24/2016   BUN 14 07/24/2016   CO2 31 07/24/2016   TSH 1.98 07/24/2016   INR 0.98 11/10/2015   HGBA1C 5.7 07/24/2016   BP Readings from Last 3 Encounters:  07/24/16 110/70  02/20/16 126/70  02/13/16 136/86    ASSESSMENT AND PLAN:  Discussed the following assessment and plan:  Essential hypertension  Medication management  Hyperlipidemia, unspecified hyperlipidemia type  Chronic kidney disease, unspecified CKD stage  Primary osteoarthritis involving multiple joints  -Patient advised to return or notify health care team  if  new concerns arise.  There are no Patient Instructions on file for this visit.   Standley Brooking. Panosh M.D.

## 2017-05-09 ENCOUNTER — Ambulatory Visit: Payer: PPO | Admitting: Internal Medicine

## 2017-05-12 ENCOUNTER — Ambulatory Visit: Payer: Self-pay | Admitting: Internal Medicine

## 2017-05-15 ENCOUNTER — Telehealth: Payer: Self-pay | Admitting: Internal Medicine

## 2017-05-15 MED ORDER — METOPROLOL TARTRATE 25 MG PO TABS: 25.0000 mg | ORAL_TABLET | Freq: Two times a day (BID) | ORAL | 3 refills | Status: DC

## 2017-05-15 NOTE — Telephone Encounter (Signed)
Dr. Regis Bill - Ok to change rx? Thanks!

## 2017-05-15 NOTE — Telephone Encounter (Signed)
Copied from [place] [id number]. Topic: Quick Communication - See Telephone Encounter >> May 15, 2017 11:10 AM Boyd Kerbs wrote: CRM for notification. See Telephone encounter for:   [name] patient is asking for new prescription   Metoprolol 50mg  and wants prescription for the 25 mg as she having problems cutting them.  05/15/17.

## 2017-05-15 NOTE — Telephone Encounter (Signed)
Ok to do 25 mg bid metoprolol disp 60  #  Refill x 1 due for Ov before runs out ( she had canceled )

## 2017-05-15 NOTE — Telephone Encounter (Signed)
Rx changed and sent to pharmacy. Pt notified.

## 2017-05-15 NOTE — Telephone Encounter (Signed)
[facility] Met[facility]rolol 25mg  instead of 50mg  due to trouble cutting the current dosage.

## 2017-06-30 ENCOUNTER — Ambulatory Visit (INDEPENDENT_AMBULATORY_CARE_PROVIDER_SITE_OTHER): Payer: PPO | Admitting: Family Medicine

## 2017-06-30 ENCOUNTER — Encounter: Payer: Self-pay | Admitting: Family Medicine

## 2017-06-30 VITALS — BP 118/70 | HR 89 | Temp 98.7°F | Wt 170.0 lb

## 2017-06-30 DIAGNOSIS — R8290 Unspecified abnormal findings in urine: Secondary | ICD-10-CM

## 2017-06-30 LAB — POC URINALSYSI DIPSTICK (AUTOMATED)
GLUCOSE UA: NEGATIVE
NITRITE UA: NEGATIVE
Protein, UA: 1
RBC UA: NEGATIVE
Spec Grav, UA: 1.025 (ref 1.010–1.025)
UROBILINOGEN UA: 1 U/dL
pH, UA: 6 (ref 5.0–8.0)

## 2017-06-30 NOTE — Progress Notes (Signed)
Subjective:    Patient ID: [name] [name], female    DOB: 1941/04/11, 77 y.o.   MRN: [id number]  Chief Complaint  Patient presents with  . Follow-up    HPI Patient was seen today for ongoing concern.  Pt endorses having foamy appearing urine that also looks like it has an oily film over it x 6-8 wks.  Pt also endorses feeling dizzy "once and a while".  Pt denies dysuria, hematuria, frequency, back pain,  Fever, chills, nausea, vomiting.  Pt has not taken anything for her symptoms.   Pt endorses trying to drink more water each day.  Pt is unable to quantify the amount of water she drinks daily.  Pt endorses a prior history of interstitial cystitis and renal calculi.  Past Medical History:  Diagnosis Date  . Acute kidney failure ([place])    5 years ago  . ACUTE KIDNEY FAILURE UNSPECIFIED 07/12/2009  . Allergy   . Anxiety   . Arthritis   . CAD (coronary artery disease)    Mild nonobstructive by CT  . 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 Cataract   . Chronic kidney disease    stones  . Depression   . Diverticulosis of colon (without mention of hemorrhage)   . Fibromyalgia   . GERD (gastroesophageal reflux disease)   . Hiatal hernia   . Hyperlipidemia   . Hypertension   . Osteopenia   . PONV (postoperative nausea and vomiting)   . 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 Aspirin Other (See Comments)    Acute renal failure   . Lactose Intolerance (Gi) Other (See Comments)    Gi upset  . Meloxicam     REACTION: acute renal failure  with hypovolemic trigger [facility]  3 2011 Pt states allergic to all NSAIDS   . Sulfamethoxazole Hives  . Tylenol [Acetaminophen] Other (See Comments)    Acute renal failure     ROS General: Denies fever, chills, night sweats, changes in weight, changes in appetite HEENT: Denies headaches, ear pain, changes in vision, rhinorrhea, sore throat CV: Denies CP, palpitations,  SOB, orthopnea Pulm: Denies SOB, cough, wheezing GI: Denies abdominal pain, nausea, vomiting, diarrhea, constipation GU: Denies dysuria, hematuria, frequency, vaginal discharge   +foamy appearing urine. Msk: Denies muscle cramps, joint pains Neuro: Denies weakness, numbness, tingling Skin: Denies rashes, bruising Psych: Denies depression, anxiety, hallucinations     Objective:    Blood pressure 118/70, pulse 89, temperature 98.7 F (37.1 C), temperature source Oral, weight 170 lb (77.1 kg), SpO2 98 %.   Gen. Pleasant, well-nourished, in no distress, normal affect   HEENT: [place]/AT, face symmetric, no scleral icterus, PERRLA, nares patent without drainage Lungs: no accessory muscle use, CTAB, no wheezes or rales Cardiovascular: RRR, no m/r/g, no peripheral edema Abdomen: BS present, soft, NT/ND Neuro:  A&Ox3, CN II-XII intact, normal gait    Wt Readings from Last 3 Encounters:  06/30/17 170 lb (77.1 kg)  07/24/16 159 lb 14.4 oz (72.5 kg)  02/20/16 168 lb 2 oz (76.3 kg)    Lab Results  Component Value Date   WBC 5.5 07/24/2016   HGB 13.4 07/24/2016   HCT 40.4 07/24/2016   PLT 172.0 07/24/2016   GLUCOSE 105 (H) 07/24/2016   CHOL 138 07/24/2016   TRIG 67.0 07/24/2016   HDL 58.90 07/24/2016   LDLDIRECT 179.1 10/09/2007   [place]  66 07/24/2016   ALT 11 07/24/2016   AST 16 07/24/2016   NA 143 07/24/2016   K 4.1 07/24/2016   CL 107 07/24/2016   CREATININE 0.95 07/24/2016   BUN 14 07/24/2016   CO2 31 07/24/2016   TSH 1.98 07/24/2016   INR 0.98 11/10/2015   HGBA1C 5.7 07/24/2016    Assessment/Plan:  Abnormal urine  -concern for nephrotic vs nephritic syndrome vs UTI -UA with 2+ bilirubin, SG 1.025, protein 1+, 2+ leuks - Plan: POCT Urinalysis Dipstick (Automated), Urine Culture, Protein / Creatinine Ratio, Urine, Basic metabolic panel -will wait on lab results.  No abx given at this time. -Patient encouraged to increase p.o. intake of water and fluids. -Pt given RTC  or ED precautions  Grier Mitts, MD

## 2017-07-01 LAB — PROTEIN / CREATININE RATIO, URINE
Creatinine, Urine: 252 mg/dL (ref 20–275)
Protein/Creat Ratio: 278 mg/g creat — ABNORMAL HIGH (ref 21–161)
TOTAL PROTEIN, URINE: 70 mg/dL — AB (ref 5–24)

## 2017-07-01 LAB — BASIC METABOLIC PANEL
BUN: 15 mg/dL (ref 6–23)
CHLORIDE: 103 meq/L (ref 96–112)
CO2: 27 meq/L (ref 19–32)
Calcium: 9.7 mg/dL (ref 8.4–10.5)
Creatinine, Ser: 1.1 mg/dL (ref 0.40–1.20)
GFR: 51.26 mL/min — ABNORMAL LOW (ref >60.00)
GLUCOSE: 102 mg/dL — AB (ref 70–99)
Potassium: 3.6 mEq/L (ref 3.5–5.1)
SODIUM: 139 meq/L (ref 135–145)

## 2017-07-01 LAB — URINE CULTURE
MICRO NUMBER:: 90307024
RESULT: NO GROWTH
SPECIMEN QUALITY: ADEQUATE

## 2017-07-02 ENCOUNTER — Telehealth: Payer: Self-pay | Admitting: Family Medicine

## 2017-07-02 ENCOUNTER — Other Ambulatory Visit: Payer: Self-pay | Admitting: Family Medicine

## 2017-07-02 DIAGNOSIS — R8290 Unspecified abnormal findings in urine: Secondary | ICD-10-CM

## 2017-07-02 DIAGNOSIS — N183 Chronic kidney disease, stage 3 unspecified: Secondary | ICD-10-CM

## 2017-07-02 NOTE — Addendum Note (Signed)
Addended by: Tomi Likens on: 07/02/2017 05:13 PM   Modules accepted: Orders

## 2017-07-02 NOTE — Telephone Encounter (Signed)
Patient is returning call about her lab results- patient informed and she will come by the lab to pick up supplies for 24 hour urine.   Lab not in basket.

## 2017-07-04 NOTE — Telephone Encounter (Signed)
Will send to Dr Regis Bill as Juluis Rainier as to why results are going to be delayed of 24hr Urine.  Nothing further needed.

## 2017-07-04 NOTE — Telephone Encounter (Signed)
Patient states she has been sick and sleeping a lot and has not had the chance to do the urine collection. She states she is going to do the 24 hour urine on Sunday 07/06/17 and bring it in on Monday 07/07/17.  CB# [phone] or [phone]

## 2017-07-06 NOTE — Telephone Encounter (Addendum)
Dr Volanda Napoleon ordered this test  .  After office visit with her in my absence .  Please add on orders for creatinine/ clearance  To the 24 hours urine ...  Her last OV with me was 4 18

## 2017-07-07 DIAGNOSIS — R8290 Unspecified abnormal findings in urine: Secondary | ICD-10-CM | POA: Diagnosis not present

## 2017-07-07 NOTE — Addendum Note (Signed)
Addended by: Elmer Picker on: 07/07/2017 09:27 AM   Modules accepted: Orders

## 2017-07-07 NOTE — Addendum Note (Signed)
Addended by: Elmer Picker on: 07/07/2017 09:32 AM   Modules accepted: Orders

## 2017-07-07 NOTE — Addendum Note (Signed)
Addended by: Dorrene German on: 07/07/2017 09:28 AM   Modules accepted: Orders

## 2017-07-07 NOTE — Addendum Note (Signed)
Addended by: Denna Haggard K on: 07/07/2017 10:20 AM   Modules accepted: Orders

## 2017-07-07 NOTE — Telephone Encounter (Signed)
Discussed w/ Denna Haggard, lab tech. Future orders entered for creatinine. No further needs at this time.

## 2017-07-08 LAB — CREATININE CLEARANCE, URINE, 24 HOUR
CREATININE, UR: 50.6 mg/dL
Creatinine, Ser: 0.96 mg/dL (ref 0.57–1.00)
GFR calc non Af Amer: 58 mL/min/{1.73_m2} — ABNORMAL LOW (ref >59)
GFR, EST AFRICAN AMERICAN: 66 mL/min/{1.73_m2} (ref >59)

## 2017-07-10 ENCOUNTER — Telehealth: Payer: Self-pay | Admitting: Internal Medicine

## 2017-07-10 LAB — PROTEIN, URINE, 24 HOUR
PROTEIN UR: 33.9 mg/dL
Protein, 24H Urine: 509 mg/24 hr — ABNORMAL HIGH (ref 30–150)

## 2017-07-10 NOTE — Telephone Encounter (Signed)
Please call cell phone [phone]

## 2017-07-10 NOTE — Telephone Encounter (Signed)
Copied from [place] [id number] [id number]. Topic: Quick Communication - See Telephone Encounter >> Jul 10, 2017 12:19 PM Bea Graff, NT wrote: CRM for notification. See Telephone encounter for: 07/10/17.Pt would like a call with her urine results.

## 2017-07-11 NOTE — Telephone Encounter (Signed)
Dr Volanda Napoleon please advise on urine results.  Pt is very adamant about getting this results asap.  Thanks.

## 2017-07-11 NOTE — Telephone Encounter (Signed)
I don't have any new results.  Pt was to be called last wk about the results.  If I remember correctly she needs to have a 24 hour urine collection.

## 2017-07-11 NOTE — Telephone Encounter (Signed)
She did have a 24-hr urine collected. They are in [facility]

## 2017-07-14 ENCOUNTER — Other Ambulatory Visit: Payer: Self-pay | Admitting: Internal Medicine

## 2017-07-14 DIAGNOSIS — N183 Chronic kidney disease, stage 3 unspecified: Secondary | ICD-10-CM

## 2017-07-14 NOTE — Telephone Encounter (Signed)
Caller Name: Kaylianna Detert Phone: try home phone first [phone] then cell phone [phone]  Pt called in to check on results and upset that she has not received a call back since 07/07/17. Pt worried bc she has had acute kidney failure. Please advise.

## 2017-07-14 NOTE — Telephone Encounter (Signed)
Patient's 24-hour urine protein was elevated but not enough to be considered nephrotic range proteinuria.  Patient should follow-up with her PCP to have her proteinuria periodically checked.

## 2017-07-14 NOTE — Telephone Encounter (Signed)
Please advise Dr Panosh, thanks.   

## 2017-07-14 NOTE — Telephone Encounter (Signed)
Results were just entered this morning - see result note.

## 2017-07-24 ENCOUNTER — Telehealth: Payer: Self-pay | Admitting: Internal Medicine

## 2017-07-24 NOTE — Telephone Encounter (Signed)
Copied from [place] [id number]. Topic: Inquiry >> Jul 24, 2017  4:41 PM Cecelia Byars, NT wrote: Reason for CRM: Patient would like a call in regards to an appointment with [facility] please call her with more information at  [phone] or  [phone]

## 2017-07-25 NOTE — Telephone Encounter (Signed)
I spoke with pt, referral was made and I gave pt the phone number to follow up on scheduling.

## 2017-07-29 ENCOUNTER — Other Ambulatory Visit: Payer: Self-pay | Admitting: Internal Medicine

## 2017-07-30 ENCOUNTER — Other Ambulatory Visit: Payer: Self-pay | Admitting: Internal Medicine

## 2017-08-25 ENCOUNTER — Other Ambulatory Visit: Payer: Self-pay | Admitting: *Deleted

## 2017-08-25 MED ORDER — DULOXETINE HCL 60 MG PO CPEP: 60.0000 mg | ORAL_CAPSULE | Freq: Two times a day (BID) | ORAL | 0 refills | Status: DC

## 2017-09-16 ENCOUNTER — Other Ambulatory Visit: Payer: Self-pay | Admitting: Internal Medicine

## 2017-11-20 ENCOUNTER — Other Ambulatory Visit: Payer: Self-pay | Admitting: Internal Medicine

## 2017-12-08 ENCOUNTER — Other Ambulatory Visit: Payer: Self-pay | Admitting: Internal Medicine

## 2017-12-16 DIAGNOSIS — N183 Chronic kidney disease, stage 3 (moderate): Secondary | ICD-10-CM | POA: Diagnosis not present

## 2017-12-16 DIAGNOSIS — R809 Proteinuria, unspecified: Secondary | ICD-10-CM | POA: Diagnosis not present

## 2017-12-16 DIAGNOSIS — D631 Anemia in chronic kidney disease: Secondary | ICD-10-CM | POA: Diagnosis not present

## 2017-12-16 DIAGNOSIS — E559 Vitamin D deficiency, unspecified: Secondary | ICD-10-CM | POA: Diagnosis not present

## 2017-12-16 DIAGNOSIS — I129 Hypertensive chronic kidney disease with stage 1 through stage 4 chronic kidney disease, or unspecified chronic kidney disease: Secondary | ICD-10-CM | POA: Diagnosis not present

## 2017-12-18 ENCOUNTER — Other Ambulatory Visit: Payer: Self-pay | Admitting: Nephrology

## 2017-12-18 DIAGNOSIS — I129 Hypertensive chronic kidney disease with stage 1 through stage 4 chronic kidney disease, or unspecified chronic kidney disease: Secondary | ICD-10-CM

## 2017-12-18 DIAGNOSIS — D631 Anemia in chronic kidney disease: Secondary | ICD-10-CM

## 2017-12-18 DIAGNOSIS — N183 Chronic kidney disease, stage 3 unspecified: Secondary | ICD-10-CM

## 2017-12-18 DIAGNOSIS — N189 Chronic kidney disease, unspecified: Secondary | ICD-10-CM

## 2017-12-18 DIAGNOSIS — E559 Vitamin D deficiency, unspecified: Secondary | ICD-10-CM

## 2017-12-18 DIAGNOSIS — R809 Proteinuria, unspecified: Secondary | ICD-10-CM

## 2017-12-26 ENCOUNTER — Ambulatory Visit
Admission: RE | Admit: 2017-12-26 | Discharge: 2017-12-26 | Disposition: A | Discharge status: Home / Self Care | Payer: PPO | Source: Ambulatory Visit | Attending: Nephrology | Admitting: Nephrology

## 2017-12-26 DIAGNOSIS — E559 Vitamin D deficiency, unspecified: Secondary | ICD-10-CM

## 2017-12-26 DIAGNOSIS — I129 Hypertensive chronic kidney disease with stage 1 through stage 4 chronic kidney disease, or unspecified chronic kidney disease: Secondary | ICD-10-CM

## 2017-12-26 DIAGNOSIS — N183 Chronic kidney disease, stage 3 unspecified: Secondary | ICD-10-CM

## 2017-12-26 DIAGNOSIS — N189 Chronic kidney disease, unspecified: Secondary | ICD-10-CM

## 2017-12-26 DIAGNOSIS — D631 Anemia in chronic kidney disease: Secondary | ICD-10-CM

## 2017-12-26 DIAGNOSIS — R809 Proteinuria, unspecified: Secondary | ICD-10-CM

## 2018-01-01 DIAGNOSIS — H04123 Dry eye syndrome of bilateral lacrimal glands: Secondary | ICD-10-CM | POA: Diagnosis not present

## 2018-01-01 DIAGNOSIS — H16143 Punctate keratitis, bilateral: Secondary | ICD-10-CM | POA: Diagnosis not present

## 2018-01-01 DIAGNOSIS — H01022 Squamous blepharitis right lower eyelid: Secondary | ICD-10-CM | POA: Diagnosis not present

## 2018-01-01 DIAGNOSIS — H01021 Squamous blepharitis right upper eyelid: Secondary | ICD-10-CM | POA: Diagnosis not present

## 2018-01-01 DIAGNOSIS — H01024 Squamous blepharitis left upper eyelid: Secondary | ICD-10-CM | POA: Diagnosis not present

## 2018-01-01 DIAGNOSIS — H01025 Squamous blepharitis left lower eyelid: Secondary | ICD-10-CM | POA: Diagnosis not present

## 2018-01-16 DIAGNOSIS — H26492 Other secondary cataract, left eye: Secondary | ICD-10-CM | POA: Diagnosis not present

## 2018-01-16 DIAGNOSIS — H01025 Squamous blepharitis left lower eyelid: Secondary | ICD-10-CM | POA: Diagnosis not present

## 2018-01-16 DIAGNOSIS — H04123 Dry eye syndrome of bilateral lacrimal glands: Secondary | ICD-10-CM | POA: Diagnosis not present

## 2018-01-16 DIAGNOSIS — H01024 Squamous blepharitis left upper eyelid: Secondary | ICD-10-CM | POA: Diagnosis not present

## 2018-01-16 DIAGNOSIS — H01021 Squamous blepharitis right upper eyelid: Secondary | ICD-10-CM | POA: Diagnosis not present

## 2018-01-16 DIAGNOSIS — H01022 Squamous blepharitis right lower eyelid: Secondary | ICD-10-CM | POA: Diagnosis not present

## 2018-03-13 ENCOUNTER — Other Ambulatory Visit: Payer: Self-pay | Admitting: Internal Medicine

## 2018-03-16 ENCOUNTER — Other Ambulatory Visit: Payer: Self-pay | Admitting: Internal Medicine

## 2018-04-21 ENCOUNTER — Other Ambulatory Visit: Payer: Self-pay | Admitting: Internal Medicine

## 2018-04-27 NOTE — Telephone Encounter (Signed)
Last OV:07/24/16 Last filled:03/18/18 Please advise

## 2018-04-27 NOTE — Telephone Encounter (Signed)
Haven seen patient since 2018  needs OV  30 minutes  And no appt on the schedule   Make her an appt  And then give  20 days of med  until she gets in to see [place]  thanks

## 2018-04-28 NOTE — Telephone Encounter (Signed)
Lvm for pt to call back on son's phone. Tried numbers in pts chart but phone numbers did not work. Called to schedule OV with Dr.Panosh

## 2018-05-01 DIAGNOSIS — N183 Chronic kidney disease, stage 3 (moderate): Secondary | ICD-10-CM | POA: Diagnosis not present

## 2018-05-01 DIAGNOSIS — I129 Hypertensive chronic kidney disease with stage 1 through stage 4 chronic kidney disease, or unspecified chronic kidney disease: Secondary | ICD-10-CM | POA: Diagnosis not present

## 2018-05-01 DIAGNOSIS — R809 Proteinuria, unspecified: Secondary | ICD-10-CM | POA: Diagnosis not present

## 2018-05-01 DIAGNOSIS — E559 Vitamin D deficiency, unspecified: Secondary | ICD-10-CM | POA: Diagnosis not present

## 2018-05-01 LAB — BASIC METABOLIC PANEL
BUN: 28 — AB (ref 4–21)
Creatinine: 1.3 — AB (ref 0.5–1.1)
GLUCOSE: 104
Potassium: 4.2 (ref 3.4–5.3)
Sodium: 138 (ref 137–147)

## 2018-05-14 NOTE — Telephone Encounter (Signed)
LM for follow up visit needed.

## 2018-06-07 ENCOUNTER — Other Ambulatory Visit: Payer: Self-pay | Admitting: Internal Medicine

## 2018-06-10 ENCOUNTER — Other Ambulatory Visit: Payer: Self-pay | Admitting: Internal Medicine

## 2018-06-10 NOTE — Telephone Encounter (Signed)
I certainly dont want her to run out of her medicine  . She can let us know if we need to work her in when needed

## 2018-06-10 NOTE — Telephone Encounter (Signed)
Spoke with pt and she said she will schedule an appt but is not needing anything right now and that she has had a lot going on and has been sick but will call and schedule an appt when she needs refills since she is not totally out

## 2018-06-18 ENCOUNTER — Encounter: Payer: Self-pay | Admitting: Internal Medicine

## 2018-07-07 ENCOUNTER — Other Ambulatory Visit: Payer: Self-pay | Admitting: Internal Medicine

## 2018-07-15 ENCOUNTER — Other Ambulatory Visit: Payer: Self-pay | Admitting: Internal Medicine

## 2018-08-12 ENCOUNTER — Other Ambulatory Visit: Payer: Self-pay | Admitting: Internal Medicine

## 2018-08-12 NOTE — Progress Notes (Signed)
Virtual Visit via Video Note  I connected with@ on 08/13/18 at  9:45 AM EDT by a video enabled telemedicine application and verified that I am speaking with the correct person using two identifiers. Location patient: home Location provider:work or home office Persons participating in the virtual visit: patient, provider  [place] national recommendations  regarding COVID 19 pandemic   video visit is advised over in office visit for this patient.  Discussed the limitations of evaluation and management by telemedicine and  availability of in person appointments. The patient expressed understanding and agreed to proceed.   HPI: [name] [name]   Chronic disease management  Not seem by me for a while  4 2018  And failed to fu for various reasons     Has hx of acute rf form meds most likely and persistent proteinuria   Has seen renal for eval and seeing in 6 mos    Mild renal atrophy no acute findings on [place]   BP controlled says ok not checking recently.  neess refill cymbalta   Bid for pain and anxiety    Has seen  der elsner in past  Back pain  Not interested in surgery at this time   She has stayed mostly isolated a good bit in the COVID epidemic.  She describes meticulous cleaning and social distancing.  She has not really exercised recently but plans on doing some stable walking if she can.  Her arthritis is really the most problematic has not taken tramadol in quite a while has seen Dr. Jeanell Sparrow most in the past.  She has a supportive family.  She recently was treated for UTI.  Her IBS is problematic may need to talk with her GI doctor when things calm down she has alternating constipation diarrhea and urgency has used align in the past not that much help not recently.    ROS: See pertinent positives and negatives per HPI.  Past Medical History:  Diagnosis Date  . Acute kidney failure ([place])    5 years ago  . ACUTE KIDNEY FAILURE UNSPECIFIED 07/12/2009  . Allergy   . Anxiety   .  Arthritis   . CAD (coronary artery disease)    Mild nonobstructive by CT  . 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 Cataract   . Chronic kidney disease    stones  . Depression   . Diverticulosis of colon (without mention of hemorrhage)   . Fibromyalgia   . GERD (gastroesophageal reflux disease)   . Hiatal hernia   . Hyperlipidemia   . Hypertension   . Osteopenia   . PONV (postoperative nausea and vomiting)   . 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 BLADDER TUMOR EXCISION  2001  . CATARACT EXTRACTION     bilateral  . CHOLECYSTECTOMY    . CYSTECTOMY  2001   benign  . ESOPHAGEAL MANOMETRY N/A 07/13/2012   Procedure: ESOPHAGEAL MANOMETRY (EM);  Surgeon: Sable Feil, MD;  Location: [facility];  Service: Endoscopy;  Laterality: N/A;  . LUMBAR DISC SURGERY     x 2 elsner  . REVERSE SHOULDER ARTHROPLASTY Right 08/12/2014   Procedure: RIGHT REVERSE TOTAL SHOULDER ARTHROPLASTY;  Surgeon: Netta Cedars, MD;  Location: [place];  Service: Orthopedics;  Laterality: Right;  . TENDON REPAIR  11/2005   right shoulder  . TOTAL KNEE ARTHROPLASTY  left  . TOTAL KNEE ARTHROPLASTY Right 11/17/2015   Procedure: RIGHT TOTAL KNEE ARTHROPLASTY;  Surgeon: Sydnee Cabal, MD;  Location: [facility];  Service: Orthopedics;  Laterality: Right;    Family History  Problem Relation Age of Onset  . Alzheimer's disease Mother   . Lung cancer Father        tobacco  . Kidney cancer Son   . Hypertension Brother        mom's side  . Osteoporosis Sister   . Breast cancer Cousin   . Heart disease Maternal Uncle        x 4  . Heart disease Maternal Aunt        x 4  . Diabetes Paternal Grandmother   . Diabetes Paternal Uncle        x 2  . Colon cancer Neg Hx     Social History   Tobacco Use  . Smoking status: Never Smoker  . Smokeless tobacco: Never Used  Substance Use Topics  . Alcohol use:  Yes    Alcohol/week: 0.0 standard drinks    Comment: rare  . Drug use: No      Current Outpatient Medications:  .  amLODipine (NORVASC) 5 MG tablet, TAKE 1 TABLET BY MOUTH EVERY DAY, Disp: 90 tablet, Rfl: 0 .  baclofen (LIORESAL) 10 MG tablet, Take 1 tablet (10 mg total) by mouth 3 (three) times daily as needed for muscle spasms., Disp: 50 each, Rfl: 0 .  Cyanocobalamin (B-12 PO), Take 500 mcg by mouth daily. Reported on 05/29/2015, Disp: , Rfl:  .  DULoxetine (CYMBALTA) 60 MG capsule, Take 1 capsule (60 mg total) by mouth 2 (two) times daily., Disp: 60 capsule, Rfl: 3 .  metoprolol tartrate (LOPRESSOR) 25 MG tablet, TAKE 1 TABLET BY MOUTH TWICE A DAY, Disp: 180 tablet, Rfl: 0 .  metoprolol tartrate (LOPRESSOR) 50 MG tablet, TAKE 0.5 TABLETS (25 MG TOTAL) BY MOUTH 2 (TWO) TIMES DAILY., Disp: 90 tablet, Rfl: 0 .  omeprazole (PRILOSEC) 20 MG capsule, TAKE 1 CAPSULE BY MOUTH TWICE A DAY, Disp: 180 capsule, Rfl: 0 .  Probiotic Product (ALIGN PO), Take 1 tablet by mouth daily., Disp: , Rfl:  .  rosuvastatin (CRESTOR) 40 MG tablet, TAKE 1 TABLET BY MOUTH EVERY DAY, Disp: 90 tablet, Rfl: 0 .  Vitamin D, Ergocalciferol, (DRISDOL) 1.25 MG (50000 UT) CAPS capsule, Take 50,000 Units by mouth once a week., Disp: , Rfl:   EXAM: BP Readings from Last 3 Encounters:  06/30/17 118/70  07/24/16 110/70  02/20/16 126/70    VITALS per patient if applicable:  GENERAL: alert, oriented, appears well and in no acute distress  HEENT: atraumatic, conjunttiva clear, no obvious abnormalities on inspection of external nose and ears  NECK: normal movements of the head and neck  LUNGS: on inspection no signs of respiratory distress, breathing rate appears normal, no obvious gross SOB, gasping or wheezing  CV: no obvious cyanosis  MS: moves all visible extremities without noticeable abnormality she has obvious osteoarthritis changes in her hands but moves around quite well.  PSYCH/NEURO: pleasant and  cooperative, no obvious depression or anxiety, speech and thought processing grossly intact Lab Results  Component Value Date   WBC 5.5 07/24/2016   HGB 13.4 07/24/2016   HCT 40.4 07/24/2016   PLT 172.0 07/24/2016   GLUCOSE 102 (H) 06/30/2017   CHOL 138 07/24/2016   TRIG 67.0 07/24/2016   HDL 58.90 07/24/2016   LDLDIRECT 179.1 10/09/2007   LDLCALC 66 07/24/2016  ALT 11 07/24/2016   AST 16 07/24/2016   NA 138 05/01/2018   K 4.2 05/01/2018   CL 103 06/30/2017   CREATININE 1.3 (A) 05/01/2018   BUN 28 (A) 05/01/2018   CO2 27 06/30/2017   TSH 1.98 07/24/2016   INR 0.98 11/10/2015   HGBA1C 5.7 07/24/2016    ASSESSMENT AND PLAN:  Discussed the following assessment and plan:  Medication management - Plan: Basic metabolic panel, Hepatic function panel, Lipid panel, Hemoglobin A1c  Primary osteoarthritis involving multiple joints - Plan: Basic metabolic panel, Hepatic function panel, Lipid panel, Hemoglobin A1c  Hyperlipidemia, unspecified hyperlipidemia type - Plan: Basic metabolic panel, Hepatic function panel, Lipid panel, Hemoglobin A1c  Hyperglycemia - Plan: Basic metabolic panel, Hepatic function panel, Lipid panel, Hemoglobin A1c  Irritable bowel syndrome with both constipation and diarrhea - Plan: Basic metabolic panel, Hepatic function panel, Lipid panel, Hemoglobin A1c  CKD (chronic kidney disease), stage III (HCC)  Multiple joint pain  Counseled.  renal disease felt to be from ht and asvd age  No comment on other x avoid nsaids     check bp control  Due for lipid check    and can repeat her chemistries when she comes in.  She prefers to wait to the summer when things are bit safer.  [facility] orders and physical can be scheduled and we can change this as needed. We will refill her Cymbalta today other medications as needed. Disc  Her plans  For social distancing and covid exposure  Appropriate and reminder  About rationale about masks    Expectant management and  discussion of plan and treatment with patient with opportunity to ask questions and all were answered. The patient agreed with the plan and demonstrated an understanding of the instructions.  Total visit 29mins > 50% spent counseling and coordinating care as indicated in above note and in instructions to patient .     The patient was advised to call back or seek an in-person evaluation if having concerns .   Shanon Ace, MD

## 2018-08-13 ENCOUNTER — Encounter: Payer: Self-pay | Admitting: Internal Medicine

## 2018-08-13 ENCOUNTER — Ambulatory Visit (INDEPENDENT_AMBULATORY_CARE_PROVIDER_SITE_OTHER): Payer: PPO | Admitting: Internal Medicine

## 2018-08-13 ENCOUNTER — Other Ambulatory Visit: Payer: Self-pay

## 2018-08-13 DIAGNOSIS — N183 Chronic kidney disease, stage 3 unspecified: Secondary | ICD-10-CM

## 2018-08-13 DIAGNOSIS — E785 Hyperlipidemia, unspecified: Secondary | ICD-10-CM | POA: Diagnosis not present

## 2018-08-13 DIAGNOSIS — R739 Hyperglycemia, unspecified: Secondary | ICD-10-CM | POA: Diagnosis not present

## 2018-08-13 DIAGNOSIS — Z79899 Other long term (current) drug therapy: Secondary | ICD-10-CM

## 2018-08-13 DIAGNOSIS — K582 Mixed irritable bowel syndrome: Secondary | ICD-10-CM | POA: Diagnosis not present

## 2018-08-13 DIAGNOSIS — M2550 Pain in unspecified joint: Secondary | ICD-10-CM

## 2018-08-13 DIAGNOSIS — M150 Primary generalized (osteo)arthritis: Secondary | ICD-10-CM

## 2018-08-13 DIAGNOSIS — M8949 Other hypertrophic osteoarthropathy, multiple sites: Secondary | ICD-10-CM

## 2018-08-13 DIAGNOSIS — M159 Polyosteoarthritis, unspecified: Secondary | ICD-10-CM

## 2018-08-13 MED ORDER — DULOXETINE HCL 60 MG PO CPEP: 60.0000 mg | ORAL_CAPSULE | Freq: Two times a day (BID) | ORAL | 3 refills | Status: DC

## 2018-08-17 NOTE — Telephone Encounter (Signed)
Okay to fill for 90 days?

## 2018-08-17 NOTE — Telephone Encounter (Signed)
Yes-please refill for 6 months 

## 2018-09-23 ENCOUNTER — Other Ambulatory Visit: Payer: Self-pay | Admitting: Internal Medicine

## 2018-10-07 ENCOUNTER — Other Ambulatory Visit: Payer: Self-pay | Admitting: Internal Medicine

## 2018-11-19 ENCOUNTER — Other Ambulatory Visit: Payer: Self-pay

## 2018-12-25 ENCOUNTER — Other Ambulatory Visit: Payer: Self-pay | Admitting: Internal Medicine

## 2019-01-02 ENCOUNTER — Other Ambulatory Visit: Payer: Self-pay | Admitting: Internal Medicine

## 2019-04-19 ENCOUNTER — Other Ambulatory Visit: Payer: Self-pay | Admitting: Internal Medicine

## 2019-04-23 DIAGNOSIS — I671 Cerebral aneurysm, nonruptured: Secondary | ICD-10-CM

## 2019-04-23 HISTORY — DX: Cerebral aneurysm, nonruptured: I67.1

## 2019-04-26 ENCOUNTER — Other Ambulatory Visit: Payer: Self-pay | Admitting: Internal Medicine

## 2019-05-13 ENCOUNTER — Ambulatory Visit: Payer: PPO | Attending: Internal Medicine

## 2019-05-13 DIAGNOSIS — Z23 Encounter for immunization: Secondary | ICD-10-CM | POA: Insufficient documentation

## 2019-05-13 NOTE — Progress Notes (Signed)
   Covid-19 Vaccination Clinic  Name:  [name] [name]    MRN: [id number] DOB: [birth year]  05/13/2019  [name] [name] was observed post Covid-19 immunization for 15 minutes without incidence. She was provided with Vaccine Information Sheet and instruction to access the V-Safe system.   [name] [name] was instructed to call 911 with any severe reactions post vaccine: Marland Kitchen Difficulty breathing  . Swelling of your face and throat  . A fast heartbeat  . A bad rash all over your body  . Dizziness and weakness    Immunizations Administered    Name Date Dose VIS Date Route   Pfizer COVID-19 Vaccine 05/13/2019  9:47 AM 0.3 mL 04/02/2019 Intramuscular   Manufacturer: [place]   Lot: [id number]   [place]: [id number]

## 2019-06-02 ENCOUNTER — Ambulatory Visit: Payer: PPO | Attending: Internal Medicine

## 2019-06-02 DIAGNOSIS — Z23 Encounter for immunization: Secondary | ICD-10-CM | POA: Insufficient documentation

## 2019-06-02 NOTE — Progress Notes (Signed)
   Covid-19 Vaccination Clinic  Name:  [name] [name]    MRN: [id number] DOB: [birth year]  06/02/2019  [name] was observed post Covid-19 immunization for 15 minutes without incidence. She was provided with Vaccine Information Sheet and instruction to access the V-Safe system.   [name] was instructed to call 911 with any severe reactions post vaccine: Marland Kitchen Difficulty breathing  . Swelling of your face and throat  . A fast heartbeat  . A bad rash all over your body  . Dizziness and weakness    Immunizations Administered    Name Date Dose VIS Date Route   Pfizer COVID-19 Vaccine 06/02/2019  2:50 PM 0.3 mL 04/02/2019 Intramuscular   Manufacturer: [place]   Lot: [id number]   [place]: [id number]

## 2019-07-07 ENCOUNTER — Telehealth: Payer: Self-pay | Admitting: Internal Medicine

## 2019-07-07 NOTE — Progress Notes (Signed)
  Chronic Care Management   Outreach Note  07/07/2019 Name: [name] [name] MRN: [id number] DOB: [birth year]  Referred by: Burnis Medin, MD Reason for referral : No chief complaint on file.   An unsuccessful telephone outreach was attempted today. The patient was referred to the pharmacist for assistance with care management and care coordination.   Follow Up Plan:    Raynicia Dukes [employer] Scheduler

## 2019-07-26 ENCOUNTER — Other Ambulatory Visit: Payer: Self-pay | Admitting: Internal Medicine

## 2019-07-27 ENCOUNTER — Other Ambulatory Visit: Payer: Self-pay | Admitting: Internal Medicine

## 2019-08-24 ENCOUNTER — Telehealth: Payer: Self-pay | Admitting: Internal Medicine

## 2019-08-24 NOTE — Chronic Care Management (AMB) (Signed)
°  Chronic Care Management   Outreach Note  08/24/2019 Name: [name] [name] MRN: [id number] DOB: [birth year]  Referred by: Burnis Medin, MD Reason for referral : Chronic Care Management   An unsuccessful telephone outreach was attempted today. The patient was referred to the pharmacist for assistance with care management and care coordination.   Follow Up Plan:   [place]

## 2019-08-30 ENCOUNTER — Other Ambulatory Visit: Payer: Self-pay | Admitting: Internal Medicine

## 2019-08-31 NOTE — Telephone Encounter (Signed)
Called patient and scheduled her for an in office visit for tomorrow, 09/01/2019 at 9:30am. Patient needs labs and has several things to discuss with Dr. Regis Bill. Patient verbalized an understanding.

## 2019-08-31 NOTE — Progress Notes (Signed)
This visit occurred during the SARS-CoV-2 public health emergency.  Safety protocols were in place, including screening questions prior to the visit, additional usage of staff PPE, and extensive cleaning of exam room while observing appropriate contact time as indicated for disinfecting solutions.    Chief Complaint  Patient presents with  . Annual Exam    Has a lot to discuss    HPI: [name] [name] 79 y.o. comes in today for  [name] [name]  comes in today for follow up of  multiple medical problems.  And otther  Last seen a year ago  televisit 2 2020 Has a  munber or concerns and med evaluations   Over due for lab monitoring  LIPIDS meds jno se   BP no checking but taking medication  REnalfunction  Cr cl 2019 59   GI concerns :    prev dx  Dr Sharlett Iles  ? No fu?  Constipation and firm stools  Irregular.  No change needs new gi doc  Has ?s   Taking prilosec also  To help ? Reflux has ? If should continue taking  With ? fiks kidney and liver?  Scoliosis :  Feels getting worse  surgery would be extensive to fix  Back a painful problem   Mom had alzheiners  Thinks memory ok now  Compared to friends   And family prev thinks was forgetful and not as quick in naming but no loss scary episodes   New has now daily headaches  After soreness top of head   About 4-5/10 no meds  Never had has in past like this. Vision no change .   Gets recurrent vertigo  Planning on seeing ent and left ear issue    Many delayed   Plans from covid shut down has been immunized  Now  rx cymbalta 60 bid but doesn't take 2 every day   Now on otc vit d 2000  But not every day  Health Maintenance  Topic Date Due  . TETANUS/TDAP  11/20/2017  . INFLUENZA VACCINE  11/21/2019  . DEXA SCAN  Completed  . COVID-19 Vaccine  Completed  . PNA vac Low Risk Adult  Completed   Health Maintenance Review LIFESTYLE:  Exercise:  Not a lot .  Plans walking .  Some  Joint limitations  Tobacco/ETS:no Alcohol:  no Sugar  beverages:  ocass sweet tea  1/2   Sleep: y  [place]  Drug use: no HH: 1   No pets     Hearing:  ok  Vision:  No limitations at present . Last eye check UTD    Safety:  Has smoke detector and wears seat belts.  . No excess sun exposure. Sees dentist regularly.  Falls:   no  Advance directive :  Plans on updated will. doiesnt have hcpoa but has poa   Memory: seems better   , no concern from her or her family.  Depression: No anhedonia unusual crying or depressive symptoms has coping well after death of husband   Nutrition: Eats well balanced diet; adequate calcium and vitamin D. No swallowing chewing problems.  Injury: no major injuries in the last six months.  Other healthcare providers:  Reviewed today .  Preventive parameters: up-to-date  Reviewed   ADLS:   There are no problems or need for assistance  driving, feeding, obtaining food, dressing, toileting and bathing, managing money using phone. She is independent.lives alone      ROS:  Used to get reg  Pap and mammo  Never had issue pap and no hx cancer  No sx  Due for mammo  GEN/ HEENT: No fever, significant weight changes sweats vision problems CV/ PULM; No chest pain shortness of breath cough, syncope,edema  change in exercise tolerance. GI /GU: No adominal pain, vomiting, . No blood in the stool. No significant GU symptoms. SKIN/HEME: ,no acute skin rashes suspicious lesions or bleeding. No lymphadenopathy, nodules, masses.  NEURO/ PSYCH:  No neurologic signs such as weakness numbness. No depression anxiety. IMM/ Allergy: No unusual infections.  Allergy .   REST of 12 system review negative except as per HPI   Past Medical History:  Diagnosis Date  . Acute kidney failure ([place])    5 years ago  . ACUTE KIDNEY FAILURE UNSPECIFIED 07/12/2009  . Allergy   . Anxiety   . Arthritis   . CAD (coronary artery disease)    Mild nonobstructive by CT  . 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 Cataract   . Chronic kidney disease     stones  . Depression   . Diverticulosis of colon (without mention of hemorrhage)   . Fibromyalgia   . GERD (gastroesophageal reflux disease)   . Hiatal hernia   . Hyperlipidemia   . Hypertension   . Osteopenia   . PONV (postoperative nausea and vomiting)   . 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 Alzheimer's disease Mother   . Lung cancer Father        tobacco  . Kidney cancer Son   . Hypertension Brother        mom's side  . Osteoporosis Sister   . Breast cancer Cousin   . Heart disease Maternal Uncle        x 4  . Heart disease Maternal Aunt        x 4  . Diabetes Paternal Grandmother   . Diabetes Paternal Uncle        x 2  . Colon cancer Neg Hx     Social History   Socioeconomic History  . Marital status: Widowed    Spouse name: Not on file  . Number of children: 2  . Years of education: Not on file  . Highest education level: Not on file  Occupational History  . Occupation: Retired   Tobacco Use  . Smoking status: Never Smoker  . Smokeless tobacco: Never Used  Substance and Sexual Activity  . Alcohol use: Yes    Alcohol/week: 0.0 standard drinks    Comment: rare  . Drug use: No  . Sexual activity: Not on file  Other Topics Concern  . 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 Difficulty of Paying Living Expenses:   Food Insecurity:   . Worried About Charity fundraiser in the Last Year:   . Arboriculturist in the Last Year:   Transportation Needs:   . Film/video editor (Medical):   Marland Kitchen Lack of Transportation (Non-Medical):   Physical Activity:   . Days of Exercise per Week:   . Minutes of Exercise per Session:   Stress:   . Feeling of Stress :   Social Connections:   .  Frequency of Communication  with Friends and Family:   . Frequency of Social Gatherings with Friends and Family:   . Attends Religious Services:   . Active Member of Clubs or Organizations:   . Attends Archivist Meetings:   Marland Kitchen Marital Status:     Outpatient Encounter Medications as of 09/01/2019  Medication Sig  . amLODipine (NORVASC) 5 MG tablet Take 1 tablet (5 mg total) by mouth daily. Needs an appointment with labs for further refills. [phone]  . baclofen (LIORESAL) 10 MG tablet Take 1 tablet (10 mg total) by mouth 3 (three) times daily as needed for muscle spasms.  . Cyanocobalamin (B-12 PO) Take 500 mcg by mouth daily. Reported on 05/29/2015  . DULoxetine (CYMBALTA) 60 MG capsule Take 1 capsule (60 mg total) by mouth 2 (two) times daily. Please schedule an appointment for further refills. [phone]  . metoprolol tartrate (LOPRESSOR) 25 MG tablet Take 1 tablet (25 mg total) by mouth 2 (two) times daily. Needs appointment with labs for further refills. [phone]  . omeprazole (PRILOSEC) 20 MG capsule Take 1 capsule (20 mg total) by mouth 2 (two) times daily. Needs an appointment with labs for further refills. [phone]  . Probiotic Product (ALIGN PO) Take 1 tablet by mouth daily.  . rosuvastatin (CRESTOR) 40 MG tablet Take 1 tablet (40 mg total) by mouth daily. Needs appointment with labs for further refills. [phone]  . Vitamin D, Ergocalciferol, (DRISDOL) 1.25 MG (50000 UT) CAPS capsule Take 50,000 Units by mouth once a week.  . [DISCONTINUED] DULoxetine (CYMBALTA) 60 MG capsule Take 1 capsule (60 mg total) by mouth 2 (two) times daily. (Patient not taking: Reported on 09/01/2019)  . [DISCONTINUED] metoprolol tartrate (LOPRESSOR) 50 MG tablet TAKE 0.5 TABLETS (25 MG TOTAL) BY MOUTH 2 (TWO) TIMES DAILY. (Patient not taking: Reported on 09/01/2019)   No facility-administered encounter medications on file as of 09/01/2019.    EXAM:  BP 134/82   Pulse (!) 58   Temp (!) 97.3 F (36.3 C)  (Temporal)   Ht 5' 0.25" (1.53 m)   Wt 155 lb 3.2 oz (70.4 kg)   SpO2 94%   BMI 30.06 kg/m   Body mass index is 30.06 kg/m.  Physical Exam: Vital signs reviewed [id number] is a well-developed well-nourished alert cooperative   who appears stated age in no acute distress.  HEENT: normocephalic atraumatic , Eyes: PERRL EOM's full, conjunctiva clear, Nares: paten,t no deformity discharge or tenderness., Ears: no deformity EAC's clear TMs with normal landmarks. Mouth: masked  NECK:without masses, thyromegaly or bruits. No joint line tenderness  CHEST/PULM:  Clear to auscultation and percussion breath sounds equal no wheeze , rales or rhonchi. No chest wall deformities or tenderness.  Back scoliosis  Breast: normal by inspection . No dimpling, discharge, masses, tenderness or discharge . Pendulous  CV: PMI is nondisplaced, S1 S2 no gallops, murmurs, rubs. Peripheral pulses are full without delay.No JVD .  ABDOMEN: Bowel sounds normal nontender  No guard or rebound, no hepato splenomegal no CVA tenderness.   Extremtities:  No clubbing cyanosis or edema, no acute joint swelling or redness no focal atrophy but has deforming hand djd  NEURO:  Oriented x3, cranial nerves 3-12 appear to be intact, no obvious focal weakness,gait within normal limits no abnormal reflexes or asymmetrical  SKIN: No acute rashes normal turgor, color, no bruising or petechiae. PSYCH: Oriented, good eye contact, no obvious depression anxiety, cognition and judgment appear normal. LN: no cervical axillary i adenopathy No  noted deficits in memory, attention, and speech.  Lab Results  Component Value Date   WBC 6.3 09/01/2019   HGB 14.2 09/01/2019   HCT 42.9 09/01/2019   PLT 191.0 09/01/2019   GLUCOSE 114 (H) 09/01/2019   CHOL 143 09/01/2019   TRIG 69.0 09/01/2019   HDL 61.80 09/01/2019   LDLDIRECT 179.1 10/09/2007   LDLCALC 67 09/01/2019   ALT 13 09/01/2019   AST 20 09/01/2019   NA 138 09/01/2019   K 3.9  09/01/2019   CL 99 09/01/2019   CREATININE 1.18 09/01/2019   BUN 28 (H) 09/01/2019   CO2 29 09/01/2019   TSH 2.58 09/01/2019   INR 0.98 11/10/2015   HGBA1C 6.0 09/01/2019  fasting.  Today   ASSESSMENT AND PLAN:  Discussed the following assessment and plan:  Visit for preventive health examination  Headache, unspecified headache type - Plan: Basic metabolic panel, CBC with Differential/Platelet, Hemoglobin A1c, Hepatic function panel, Lipid panel, TSH, T4, free, Sedimentation rate  Primary osteoarthritis of right knee - Plan: Basic metabolic panel, CBC with Differential/Platelet, Hemoglobin A1c, Hepatic function panel, Lipid panel, TSH, T4, free, Sedimentation rate  Hyperlipidemia, unspecified hyperlipidemia type - Plan: Basic metabolic panel, CBC with Differential/Platelet, Hemoglobin A1c, Hepatic function panel, Lipid panel, TSH, T4, free, Sedimentation rate  Stage 3a chronic kidney disease - Plan: Basic metabolic panel, CBC with Differential/Platelet, Hemoglobin A1c, Hepatic function panel, Lipid panel, TSH, T4, free, Sedimentation rate  Gastroesophageal reflux disease without esophagitis - Plan: Basic metabolic panel, CBC with Differential/Platelet, Hemoglobin A1c, Hepatic function panel, Lipid panel, TSH, T4, free, Sedimentation rate  GI symptom - Plan: Basic metabolic panel, CBC with Differential/Platelet, Hemoglobin A1c, Hepatic function panel, Lipid panel, TSH, T4, free, Sedimentation rate  Vertigo - Plan: Basic metabolic panel, CBC with Differential/Platelet, Hemoglobin A1c, Hepatic function panel, Lipid panel, TSH, T4, free, Sedimentation rate, MR Brain W Wo Contrast, Ambulatory referral to Neurology  Vitamin D deficiency - Plan: VITAMIN D 25 Hydroxy (Vit-D Deficiency, Fractures)  New daily persistent headache - Plan: MR Brain W Wo Contrast, Ambulatory referral to Neurology Past due for lab monitoring  Multiple issues today  Although exam reassuring  New onset daily  headaches and hs of vertigo   Plan referral neuro and mri brain  Planned  Gi referral will place referral   She will give provider of  Preference  To call back  Patient Care Team: Shadrach Bartunek, Standley Brooking, MD as PCP - Jacklynn Lewis, MD as Attending Physician (Orthopedic Surgery) Suella Broad, MD (Physical Medicine and Rehabilitation) Clent Jacks, MD as Consulting Physician (Ophthalmology) Rica Records (Nephrology) Audery Amel Sharma Covert, [facility] as Pharmacist (Pharmacist)  Patient Instructions  Lab today  Will do GI referral . Plan  Referral for th headaches  To neurology .   Your exam is reassuring but  We will order a mri or scan of your brain because of the headaches and vertigo issues .    Health Maintenance, Female Adopting a healthy lifestyle and getting preventive care are important in promoting health and wellness. Ask your health care provider about:  The right schedule for you to have regular tests and exams.  Things you can do on your own to prevent diseases and keep yourself healthy. What should I know about diet, weight, and exercise? Eat a healthy diet   Eat a diet that includes plenty of vegetables, fruits, low-fat dairy products, and lean protein.  Do not eat a lot of foods that are high in solid fats, added sugars, or sodium.  Maintain a healthy weight Body mass index (BMI) is used to identify weight problems. It estimates body fat based on height and weight. Your health care provider can help determine your BMI and help you achieve or maintain a healthy weight. Get regular exercise Get regular exercise. This is one of the most important things you can do for your health. Most adults should:  Exercise for at least 150 minutes each week. The exercise should increase your heart rate and make you sweat (moderate-intensity exercise).  Do strengthening exercises at least twice a week. This is in addition to the moderate-intensity exercise.  Spend less time sitting. Even light  physical activity can be beneficial. Watch cholesterol and blood lipids Have your blood tested for lipids and cholesterol at 79 years of age, then have this test every 5 years. Have your cholesterol levels checked more often if:  Your lipid or cholesterol levels are high.  You are older than 79 years of age.  You are at high risk for heart disease. What should I know about cancer screening? Depending on your health history and family history, you may need to have cancer screening at various ages. This may include screening for:  Breast cancer.  Cervical cancer.  Colorectal cancer.  Skin cancer.  Lung cancer. What should I know about heart disease, diabetes, and high blood pressure? Blood pressure and heart disease  High blood pressure causes heart disease and increases the risk of stroke. This is more likely to develop in people who have high blood pressure readings, are of African descent, or are overweight.  Have your blood pressure checked: ? Every 3-5 years if you are 85-38 years of age. ? Every year if you are 78 years old or older. Diabetes Have regular diabetes screenings. This checks your fasting blood sugar level. Have the screening done:  Once every three years after age 63 if you are at a normal weight and have a low risk for diabetes.  More often and at a younger age if you are overweight or have a high risk for diabetes. What should I know about preventing infection? Hepatitis B If you have a higher risk for hepatitis B, you should be screened for this virus. Talk with your health care provider to find out if you are at risk for hepatitis B infection. Hepatitis C Testing is recommended for:  Everyone born from [birth year] through 1965.  Anyone with known risk factors for hepatitis C. Sexually transmitted infections (STIs)  Get screened for STIs, including gonorrhea and chlamydia, if: ? You are sexually active and are younger than 79 years of age. ? You are older  than 79 years of age and your health care provider tells you that you are at risk for this type of infection. ? Your sexual activity has changed since you were last screened, and you are at increased risk for chlamydia or gonorrhea. Ask your health care provider if you are at risk.  Ask your health care provider about whether you are at high risk for HIV. Your health care provider may recommend a prescription medicine to help prevent HIV infection. If you choose to take medicine to prevent HIV, you should first get tested for HIV. You should then be tested every 3 months for as long as you are taking the medicine. Pregnancy  If you are about to stop having your period (premenopausal) and you may become pregnant, seek counseling before you get pregnant.  Take 400 to 800 micrograms (mcg) of folic  acid every day if you become pregnant.  Ask for birth control (contraception) if you want to prevent pregnancy. Osteoporosis and menopause Osteoporosis is a disease in which the bones lose minerals and strength with aging. This can result in bone fractures. If you are 72 years old or older, or if you are at risk for osteoporosis and fractures, ask your health care provider if you should:  Be screened for bone loss.  Take a calcium or vitamin D supplement to lower your risk of fractures.  Be given hormone replacement therapy (HRT) to treat symptoms of menopause. Follow these instructions at home: Lifestyle  Do not use any products that contain nicotine or tobacco, such as cigarettes, e-cigarettes, and chewing tobacco. If you need help quitting, ask your health care provider.  Do not use street drugs.  Do not share needles.  Ask your health care provider for help if you need support or information about quitting drugs. Alcohol use  Do not drink alcohol if: ? Your health care provider tells you not to drink. ? You are pregnant, may be pregnant, or are planning to become pregnant.  If you drink  alcohol: ? Limit how much you use to 0-1 drink a day. ? Limit intake if you are breastfeeding.  Be aware of how much alcohol is in your drink. In the [place] one drink equals one 12 oz bottle of beer (355 mL), one 5 oz glass of wine (148 mL), or one 1 oz glass of hard liquor (44 mL). General instructions  Schedule regular health, dental, and eye exams.  Stay current with your vaccines.  Tell your health care provider if: ? You often feel depressed. ? You have ever been abused or do not feel safe at home. Summary  Adopting a healthy lifestyle and getting preventive care are important in promoting health and wellness.  Follow your health care provider's instructions about healthy diet, exercising, and getting tested or screened for diseases.  Follow your health care provider's instructions on monitoring your cholesterol and blood pressure. This information is not intended to replace advice given to you by your health care provider. Make sure you discuss any questions you have with your health care provider. Document Revised: 04/01/2018 Document Reviewed: 04/01/2018 Elsevier Patient Education  2020 [place] Maddison Kilner M.D.

## 2019-09-01 ENCOUNTER — Encounter: Payer: Self-pay | Admitting: Internal Medicine

## 2019-09-01 ENCOUNTER — Other Ambulatory Visit: Payer: Self-pay

## 2019-09-01 ENCOUNTER — Ambulatory Visit (INDEPENDENT_AMBULATORY_CARE_PROVIDER_SITE_OTHER): Payer: PPO | Admitting: Internal Medicine

## 2019-09-01 VITALS — BP 134/82 | HR 58 | Temp 97.3°F | Ht 60.25 in | Wt 155.2 lb

## 2019-09-01 DIAGNOSIS — M1711 Unilateral primary osteoarthritis, right knee: Secondary | ICD-10-CM | POA: Diagnosis not present

## 2019-09-01 DIAGNOSIS — R42 Dizziness and giddiness: Secondary | ICD-10-CM

## 2019-09-01 DIAGNOSIS — N1831 Chronic kidney disease, stage 3a: Secondary | ICD-10-CM | POA: Diagnosis not present

## 2019-09-01 DIAGNOSIS — Z0001 Encounter for general adult medical examination with abnormal findings: Secondary | ICD-10-CM

## 2019-09-01 DIAGNOSIS — K219 Gastro-esophageal reflux disease without esophagitis: Secondary | ICD-10-CM | POA: Diagnosis not present

## 2019-09-01 DIAGNOSIS — G4452 New daily persistent headache (NDPH): Secondary | ICD-10-CM

## 2019-09-01 DIAGNOSIS — R519 Headache, unspecified: Secondary | ICD-10-CM | POA: Diagnosis not present

## 2019-09-01 DIAGNOSIS — R198 Other specified symptoms and signs involving the digestive system and abdomen: Secondary | ICD-10-CM

## 2019-09-01 DIAGNOSIS — Z0000 Encounter for general adult medical examination without abnormal findings: Secondary | ICD-10-CM

## 2019-09-01 DIAGNOSIS — E559 Vitamin D deficiency, unspecified: Secondary | ICD-10-CM | POA: Diagnosis not present

## 2019-09-01 DIAGNOSIS — E785 Hyperlipidemia, unspecified: Secondary | ICD-10-CM | POA: Diagnosis not present

## 2019-09-01 LAB — BASIC METABOLIC PANEL
BUN: 28 mg/dL — ABNORMAL HIGH (ref 6–23)
CO2: 29 mEq/L (ref 19–32)
Calcium: 10.1 mg/dL (ref 8.4–10.5)
Chloride: 99 mEq/L (ref 96–112)
Creatinine, Ser: 1.18 mg/dL (ref 0.40–1.20)
GFR: 44.23 mL/min — ABNORMAL LOW (ref >60.00)
Glucose, Bld: 114 mg/dL — ABNORMAL HIGH (ref 70–99)
Potassium: 3.9 mEq/L (ref 3.5–5.1)
Sodium: 138 mEq/L (ref 135–145)

## 2019-09-01 LAB — VITAMIN D 25 HYDROXY (VIT D DEFICIENCY, FRACTURES): VITD: 20.35 ng/mL — ABNORMAL LOW (ref 30.00–100.00)

## 2019-09-01 LAB — CBC WITH DIFFERENTIAL/PLATELET
Basophils Absolute: 0.1 10*3/uL (ref 0.0–0.1)
Basophils Relative: 0.9 % (ref 0.0–3.0)
Eosinophils Absolute: 0.1 10*3/uL (ref 0.0–0.7)
Eosinophils Relative: 1.7 % (ref 0.0–5.0)
HCT: 42.9 % (ref 36.0–46.0)
Hemoglobin: 14.2 g/dL (ref 12.0–15.0)
Lymphocytes Relative: 15.8 % (ref 12.0–46.0)
Lymphs Abs: 1 10*3/uL (ref 0.7–4.0)
MCHC: 33.2 g/dL (ref 30.0–36.0)
MCV: 88.8 fl (ref 78.0–100.0)
Monocytes Absolute: 0.4 10*3/uL (ref 0.1–1.0)
Monocytes Relative: 7.1 % (ref 3.0–12.0)
Neutro Abs: 4.7 10*3/uL (ref 1.4–7.7)
Neutrophils Relative %: 74.5 % (ref 43.0–77.0)
Platelets: 191 10*3/uL (ref 150.0–400.0)
RBC: 4.83 Mil/uL (ref 3.87–5.11)
RDW: 14.5 % (ref 11.5–15.5)
WBC: 6.3 10*3/uL (ref 4.0–10.5)

## 2019-09-01 LAB — HEMOGLOBIN A1C: Hgb A1c MFr Bld: 6 % (ref 4.6–6.5)

## 2019-09-01 LAB — LIPID PANEL
Cholesterol: 143 mg/dL (ref 0–200)
HDL: 61.8 mg/dL (ref >39.00)
LDL Cholesterol: 67 mg/dL (ref 0–99)
NonHDL: 81.25
Total CHOL/HDL Ratio: 2
Triglycerides: 69 mg/dL (ref 0.0–149.0)
VLDL: 13.8 mg/dL (ref 0.0–40.0)

## 2019-09-01 LAB — HEPATIC FUNCTION PANEL
ALT: 13 U/L (ref 0–35)
AST: 20 U/L (ref 0–37)
Albumin: 4.4 g/dL (ref 3.5–5.2)
Alkaline Phosphatase: 58 U/L (ref 39–117)
Bilirubin, Direct: 0.2 mg/dL (ref 0.0–0.3)
Total Bilirubin: 0.7 mg/dL (ref 0.2–1.2)
Total Protein: 7.1 g/dL (ref 6.0–8.3)

## 2019-09-01 LAB — T4, FREE: Free T4: 0.76 ng/dL (ref 0.60–1.60)

## 2019-09-01 LAB — SEDIMENTATION RATE: Sed Rate: 18 mm/hr (ref 0–30)

## 2019-09-01 LAB — TSH: TSH: 2.58 u[IU]/mL (ref 0.35–4.50)

## 2019-09-01 NOTE — Patient Instructions (Signed)
Lab today  Will do GI referral . Plan  Referral for th headaches  To neurology .   Your exam is reassuring but  We will order a mri or scan of your brain because of the headaches and vertigo issues .    Health Maintenance, Female Adopting a healthy lifestyle and getting preventive care are important in promoting health and wellness. Ask your health care provider about:  The right schedule for you to have regular tests and exams.  Things you can do on your own to prevent diseases and keep yourself healthy. What should I know about diet, weight, and exercise? Eat a healthy diet   Eat a diet that includes plenty of vegetables, fruits, low-fat dairy products, and lean protein.  Do not eat a lot of foods that are high in solid fats, added sugars, or sodium. Maintain a healthy weight Body mass index (BMI) is used to identify weight problems. It estimates body fat based on height and weight. Your health care provider can help determine your BMI and help you achieve or maintain a healthy weight. Get regular exercise Get regular exercise. This is one of the most important things you can do for your health. Most adults should:  Exercise for at least 150 minutes each week. The exercise should increase your heart rate and make you sweat (moderate-intensity exercise).  Do strengthening exercises at least twice a week. This is in addition to the moderate-intensity exercise.  Spend less time sitting. Even light physical activity can be beneficial. Watch cholesterol and blood lipids Have your blood tested for lipids and cholesterol at 79 years of age, then have this test every 5 years. Have your cholesterol levels checked more often if:  Your lipid or cholesterol levels are high.  You are older than 79 years of age.  You are at high risk for heart disease. What should I know about cancer screening? Depending on your health history and family history, you may need to have cancer screening at  various ages. This may include screening for:  Breast cancer.  Cervical cancer.  Colorectal cancer.  Skin cancer.  Lung cancer. What should I know about heart disease, diabetes, and high blood pressure? Blood pressure and heart disease  High blood pressure causes heart disease and increases the risk of stroke. This is more likely to develop in people who have high blood pressure readings, are of African descent, or are overweight.  Have your blood pressure checked: ? Every 3-5 years if you are 79-79 years of age. ? Every year if you are 103 years old or older. Diabetes Have regular diabetes screenings. This checks your fasting blood sugar level. Have the screening done:  Once every three years after age 79 if you are at a normal weight and have a low risk for diabetes.  More often and at a younger age if you are overweight or have a high risk for diabetes. What should I know about preventing infection? Hepatitis B If you have a higher risk for hepatitis B, you should be screened for this virus. Talk with your health care provider to find out if you are at risk for hepatitis B infection. Hepatitis C Testing is recommended for:  Everyone born from [birth year] through 1965.  Anyone with known risk factors for hepatitis C. Sexually transmitted infections (STIs)  Get screened for STIs, including gonorrhea and chlamydia, if: ? You are sexually active and are younger than 79 years of age. ? You are older than 79  years of age and your health care provider tells you that you are at risk for this type of infection. ? Your sexual activity has changed since you were last screened, and you are at increased risk for chlamydia or gonorrhea. Ask your health care provider if you are at risk.  Ask your health care provider about whether you are at high risk for HIV. Your health care provider may recommend a prescription medicine to help prevent HIV infection. If you choose to take medicine to prevent  HIV, you should first get tested for HIV. You should then be tested every 3 months for as long as you are taking the medicine. Pregnancy  If you are about to stop having your period (premenopausal) and you may become pregnant, seek counseling before you get pregnant.  Take 400 to 800 micrograms (mcg) of folic acid every day if you become pregnant.  Ask for birth control (contraception) if you want to prevent pregnancy. Osteoporosis and menopause Osteoporosis is a disease in which the bones lose minerals and strength with aging. This can result in bone fractures. If you are 44 years old or older, or if you are at risk for osteoporosis and fractures, ask your health care provider if you should:  Be screened for bone loss.  Take a calcium or vitamin D supplement to lower your risk of fractures.  Be given hormone replacement therapy (HRT) to treat symptoms of menopause. Follow these instructions at home: Lifestyle  Do not use any products that contain nicotine or tobacco, such as cigarettes, e-cigarettes, and chewing tobacco. If you need help quitting, ask your health care provider.  Do not use street drugs.  Do not share needles.  Ask your health care provider for help if you need support or information about quitting drugs. Alcohol use  Do not drink alcohol if: ? Your health care provider tells you not to drink. ? You are pregnant, may be pregnant, or are planning to become pregnant.  If you drink alcohol: ? Limit how much you use to 0-1 drink a day. ? Limit intake if you are breastfeeding.  Be aware of how much alcohol is in your drink. In the [place] one drink equals one 12 oz bottle of beer (355 mL), one 5 oz glass of wine (148 mL), or one 1 oz glass of hard liquor (44 mL). General instructions  Schedule regular health, dental, and eye exams.  Stay current with your vaccines.  Tell your health care provider if: ? You often feel depressed. ? You have ever been abused or do  not feel safe at home. Summary  Adopting a healthy lifestyle and getting preventive care are important in promoting health and wellness.  Follow your health care provider's instructions about healthy diet, exercising, and getting tested or screened for diseases.  Follow your health care provider's instructions on monitoring your cholesterol and blood pressure. This information is not intended to replace advice given to you by your health care provider. Make sure you discuss any questions you have with your health care provider. Document Revised: 04/01/2018 Document Reviewed: 04/01/2018 Elsevier Patient Education  2020 [employer].

## 2019-09-02 NOTE — Telephone Encounter (Signed)
megan ok to refill  All requested meds pending  For 90 days x 3  Except cymbalta and  prilosec   For 61months  thanks

## 2019-09-03 NOTE — Progress Notes (Signed)
Results are pretty good nl blood count, liver cholesterol and  thyroid    Vit d level is somewhat  down so please take the otc vit d 1000- 2000 IU every day  Kidney function is  about the same  or a bit better   Awaiting the Head scan  to be done   Which Gi provider did you want [place] to refer to?  [name] [name] please do gi referral with provider of choice

## 2019-09-06 ENCOUNTER — Encounter: Payer: Self-pay | Admitting: Neurology

## 2019-09-13 ENCOUNTER — Other Ambulatory Visit: Payer: Self-pay

## 2019-09-13 DIAGNOSIS — R198 Other specified symptoms and signs involving the digestive system and abdomen: Secondary | ICD-10-CM

## 2019-09-22 ENCOUNTER — Encounter: Payer: Self-pay | Admitting: Nurse Practitioner

## 2019-10-05 NOTE — Progress Notes (Addendum)
NEUROLOGY CONSULTATION NOTE  ZANAYAH SHADOWENS MRN: [id number] DOB: 1941/03/30  Referring provider: Shanon Ace, MD Primary care provider: Shanon Ace, MD  Reason for consult:  Headache, vertigo  HISTORY OF PRESENT ILLNESS: [name] [name] is a 79 year old right-handed female with CKD stage 3a, HTN, HLD, CAD, GERD, osteoarthritis of right knee and history of basal cell carcinoma who presents for headache and vertigo.  History supplemented by referring provider's note.  She denies history of headaches (only once in a while).  She had two prior episodes of vertigo many years ago.    December 2019 started having episodes of vertigo, described as spinning sensation.  Occurs daily and always when laying down in bed.  Lasts 45 seconds.  No nausea, double vision, tinnitus, hearing loss, numbness or weakness.  .    Around the same time that the vertigo started, she developed a sore lump on her head that lasted about a week.  She did not hit her head.  After it resolved, she started experiencing moderate pressure-like headaches, typically right sided but may be left sided as well.  No nausea, photophobia, phonophobia, osmophobia.  They would last 1 to 2 hours and may occur 3 to 4 times a day.  No specific triggers.  She has known arthritis in the neck and applying cool compress on back of neck helps relieve headaches.  She does not take any analgesics.  MRI of brain with and without contrast scheduled for tomorrow. MRI of cervical spine from 02/17/2013 personally reviewed demonstrated multilevel cervical spondylosis with facet disease, most notable at C5-6 and C6-7, causing moderate canal stenosis   09/01/2019 LABS:  CBC with WBC 6.3, HGB 14.2, HCT 42.9, PLT 191; BMP with Na 138, K 3.9, Cl 99, CO2 29, Ca 10.1, glucose 114, BUN 28, Cr 1.18, GFR 44.23; hepatic panel with t bili 0.7, ALP 58, AST 20, ALT 13; Hgb A1c 6; vitamin D 20.35; TSH 2.58, free T4 0.76; sed rate 18.  Current NSAIDS:  None.  NSAIDs  previously caused kidney failure in 2011. Current analgesics:  none Current muscle relaxants:  Baclofen 10mg  TID PRN Current Antihypertensive medications:  Amlodipine, Lopressor Current Antidepressant medications:  Cymbalta 60mg  twice daily Current Vitamins/Herbal/Supplements:  B12    PAST MEDICAL HISTORY: Past Medical History:  Diagnosis Date  . Acute kidney failure ([facility])    5 years ago  . ACUTE KIDNEY FAILURE UNSPECIFIED 07/12/2009  . Allergy   . Anxiety   . Arthritis   . CAD (coronary artery disease)    Mild nonobstructive by CT  . 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 Cataract   . Chronic kidney disease    stones  . Depression   . Diverticulosis of colon (without mention of hemorrhage)   . Fibromyalgia   . GERD (gastroesophageal reflux disease)   . Hiatal hernia   . Hyperlipidemia   . Hypertension   . Osteopenia   . PONV (postoperative nausea and vomiting)   . 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 BLADDER TUMOR EXCISION  2001  . CATARACT EXTRACTION     bilateral  . CHOLECYSTECTOMY    . CYSTECTOMY  2001   benign  . ESOPHAGEAL MANOMETRY N/A 07/13/2012   Procedure: ESOPHAGEAL MANOMETRY (EM);  Surgeon: Sable Feil, MD;  Location: [facility];  Service: Endoscopy;  Laterality:  N/A;  . LUMBAR DISC SURGERY     x 2 elsner  . REVERSE SHOULDER ARTHROPLASTY Right 08/12/2014   Procedure: RIGHT REVERSE TOTAL SHOULDER ARTHROPLASTY;  Surgeon: Netta Cedars, MD;  Location: [facility];  Service: Orthopedics;  Laterality: Right;  . TENDON REPAIR  11/2005   right shoulder  . TOTAL KNEE ARTHROPLASTY     left  . TOTAL KNEE ARTHROPLASTY Right 11/17/2015   Procedure: RIGHT TOTAL KNEE ARTHROPLASTY;  Surgeon: Sydnee Cabal, MD;  Location: [facility];  Service: Orthopedics;  Laterality: Right;    MEDICATIONS: Current Outpatient Medications on File Prior to  Visit  Medication Sig Dispense Refill  . DULoxetine (CYMBALTA) 60 MG capsule Take 1 capsule (60 mg total) by mouth 2 (two) times daily. 180 capsule 1  . amLODipine (NORVASC) 5 MG tablet Take 1 tablet (5 mg total) by mouth daily. 90 tablet 3  . baclofen (LIORESAL) 10 MG tablet Take 1 tablet (10 mg total) by mouth 3 (three) times daily as needed for muscle spasms. 50 each 0  . Cyanocobalamin (B-12 PO) Take 500 mcg by mouth daily. Reported on 05/29/2015    . metoprolol tartrate (LOPRESSOR) 25 MG tablet Take 1 tablet (25 mg total) by mouth 2 (two) times daily. 180 tablet 3  . omeprazole (PRILOSEC) 20 MG capsule Take 1 capsule (20 mg total) by mouth 2 (two) times daily. 180 capsule 1  . Probiotic Product (ALIGN PO) Take 1 tablet by mouth daily.    . rosuvastatin (CRESTOR) 40 MG tablet Take 1 tablet (40 mg total) by mouth daily. 90 tablet 3  . Vitamin D, Ergocalciferol, (DRISDOL) 1.25 MG (50000 UT) CAPS capsule Take 50,000 Units by mouth once a week.     No current facility-administered medications on file prior to visit.    ALLERGIES: Allergies  Allergen Reactions  . Aspirin Other (See Comments)    Acute renal failure   . Lactose Intolerance (Gi) Other (See Comments)    Gi upset  . Meloxicam     REACTION: acute renal failure  with hypovolemic trigger Hospital  3 2011 Pt states allergic to all NSAIDS   . Sulfamethoxazole Hives  . Tylenol [Acetaminophen] Other (See Comments)    Acute renal failure     FAMILY HISTORY: Family History  Problem Relation Age of Onset  . Alzheimer's disease Mother   . Lung cancer Father        tobacco  . Kidney cancer Son   . Hypertension Brother        mom's side  . Osteoporosis Sister   . Breast cancer Cousin   . Heart disease Maternal Uncle        x 4  . Heart disease Maternal Aunt        x 4  . Diabetes Paternal Grandmother   . Diabetes Paternal Uncle        x 2  . Colon cancer Neg Hx    SOCIAL HISTORY: Social History   Socioeconomic History   . Marital status: Widowed    Spouse name: Not on file  . Number of children: 2  . Years of education: Not on file  . Highest education level: Not on file  Occupational History  . Occupation: Retired   Tobacco Use  . Smoking status: Never Smoker  . Smokeless tobacco: Never Used  Vaping Use  . Vaping Use: Never used  Substance and Sexual Activity  . Alcohol use: Yes    Alcohol/week: 0.0 standard drinks  Comment: rare  . Drug use: No  . Sexual activity: Not on file  Other Topics Concern  . 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 Difficulty of Paying Living Expenses:   Food Insecurity:   . Worried About Charity fundraiser in the Last Year:   . Arboriculturist in the Last Year:   Transportation Needs:   . Film/video editor (Medical):   Marland Kitchen Lack of Transportation (Non-Medical):   Physical Activity:   . Days of Exercise per Week:   . Minutes of Exercise per Session:   Stress:   . Feeling of Stress :   Social Connections:   . Frequency of Communication with Friends and Family:   . Frequency of Social Gatherings with Friends and Family:   . Attends Religious Services:   . Active Member of Clubs or Organizations:   . Attends Archivist Meetings:   Marland Kitchen Marital Status:   Intimate Partner Violence:   . Fear of Current or Ex-Partner:   . Emotionally Abused:   Marland Kitchen Physically Abused:   . Sexually Abused:     REVIEW OF SYSTEMS: Constitutional: No fevers, chills, or sweats, no generalized fatigue, change in appetite Musculoskeletal:  Neck pain; back pain  PHYSICAL EXAM: Blood pressure (!) 149/83, pulse 75, weight 159 lb 9.6 oz (72.4 kg), SpO2 97 %. General: No acute distress.  Patient appears well-groomed.  Head:  Normocephalic/atraumatic Eyes:  fundi examined but not visualized Neck: supple, no paraspinal tenderness,  full range of motion Back: No paraspinal tenderness Heart: regular rate and rhythm Lungs: Clear to auscultation bilaterally. Vascular: No carotid bruits. Neurological Exam: Mental status: alert and oriented to person, place, and time, recent and remote memory intact, fund of knowledge intact, attention and concentration intact, speech fluent and not dysarthric, language intact. Cranial nerves: CN I: not tested CN II: pupils equal, round and reactive to light, visual fields intact CN III, IV, VI:  full range of motion, no nystagmus, no ptosis CN V: facial sensation intact CN VII: upper and lower face symmetric CN VIII: hearing intact CN IX, X: gag intact, uvula midline CN XI: sternocleidomastoid and trapezius muscles intact CN XII: tongue midline Bulk & Tone: normal, no fasciculations. Motor:  5/5 throughout  Sensation: temperature and vibration sensation intact. Deep Tendon Reflexes:  2+ throughout, toes downgoing.  Finger to nose testing:  Without dysmetria.  Heel to shin:  Without dysmetria.  Gait:  Cautious.  Romberg negative.Marland Kitchen  IMPRESSION: 1.  Vertigo, probably BPPV vs cervicogenic 2.  Cervicogenic headache 3.  Cervical spondylosis  PLAN: 1.  MRI of brain is scheduled for tomorrow to evaluate for other potential intracranial etiologies. 2.  Start gabapentin 100mg  at bedtime.  We can increase to 200mg  at bedtime in 4 weeks if needed. 3.  Consider vestibular rehab, but cautious given her extensive arthritis of the cervical spine. 4.  Follow up in 4 months.  Thank you for allowing me to take part in the care of this patient.  Metta Clines, DO  CC:  Shanon Ace, MD

## 2019-10-06 ENCOUNTER — Encounter: Payer: Self-pay | Admitting: Neurology

## 2019-10-06 ENCOUNTER — Other Ambulatory Visit: Payer: Self-pay

## 2019-10-06 ENCOUNTER — Telehealth: Payer: Self-pay | Admitting: Internal Medicine

## 2019-10-06 ENCOUNTER — Ambulatory Visit: Payer: PPO | Admitting: Neurology

## 2019-10-06 VITALS — BP 149/83 | HR 75 | Wt 159.6 lb

## 2019-10-06 DIAGNOSIS — M47812 Spondylosis without myelopathy or radiculopathy, cervical region: Secondary | ICD-10-CM | POA: Diagnosis not present

## 2019-10-06 DIAGNOSIS — R42 Dizziness and giddiness: Secondary | ICD-10-CM

## 2019-10-06 DIAGNOSIS — R519 Headache, unspecified: Secondary | ICD-10-CM | POA: Diagnosis not present

## 2019-10-06 DIAGNOSIS — G4486 Cervicogenic headache: Secondary | ICD-10-CM

## 2019-10-06 MED ORDER — GABAPENTIN 100 MG PO CAPS: 100.0000 mg | ORAL_CAPSULE | Freq: Every day | ORAL | 3 refills | Status: DC

## 2019-10-06 NOTE — Patient Instructions (Signed)
1.  Start gabapentin 100mg  at bedtime.  If headaches not improved in 4 weeks, then contact me and we can increase dose. 2.  Follow up in 4 months.

## 2019-10-06 NOTE — Progress Notes (Signed)
°  Chronic Care Management   Outreach Note  10/06/2019 Name: [name] [name] MRN: [id number] DOB: April 17, 1941  Referred by: Burnis Medin, MD Reason for referral : No chief complaint on file.   Third unsuccessful telephone outreach was attempted today. The patient was referred to the pharmacist for assistance with care management and care coordination.   Follow Up Plan:   [place]

## 2019-10-07 ENCOUNTER — Ambulatory Visit
Admission: RE | Admit: 2019-10-07 | Discharge: 2019-10-07 | Disposition: A | Discharge status: Home / Self Care | Payer: PPO | Source: Ambulatory Visit | Attending: Internal Medicine | Admitting: Internal Medicine

## 2019-10-07 DIAGNOSIS — R42 Dizziness and giddiness: Secondary | ICD-10-CM

## 2019-10-07 DIAGNOSIS — G4452 New daily persistent headache (NDPH): Secondary | ICD-10-CM

## 2019-10-07 DIAGNOSIS — R519 Headache, unspecified: Secondary | ICD-10-CM | POA: Diagnosis not present

## 2019-10-07 MED ORDER — GADOBENATE DIMEGLUMINE 529 MG/ML IV SOLN
14.0000 mL | Freq: Once | INTRAVENOUS | Status: AC | PRN
  Administered 2019-10-07: 14 mL via INTRAVENOUS

## 2019-10-08 ENCOUNTER — Other Ambulatory Visit: Payer: Self-pay | Admitting: Internal Medicine

## 2019-10-08 DIAGNOSIS — R519 Headache, unspecified: Secondary | ICD-10-CM

## 2019-10-08 DIAGNOSIS — I671 Cerebral aneurysm, nonruptured: Secondary | ICD-10-CM | POA: Diagnosis not present

## 2019-10-08 NOTE — Progress Notes (Signed)
Order placed for urgen NS referral  Dx aneurysm and  New onset headaches

## 2019-10-08 NOTE — Progress Notes (Signed)
Just got back the MRI of head   there is an aneurysm that should be  evaluated  Please  do urgent referral to neurosurgery  Forwarding info to dr Tomi Likens for  input  also about plan and management

## 2019-10-14 ENCOUNTER — Ambulatory Visit: Payer: PPO | Admitting: Nurse Practitioner

## 2019-10-18 ENCOUNTER — Other Ambulatory Visit: Payer: Self-pay | Admitting: Neurosurgery

## 2019-10-18 DIAGNOSIS — I671 Cerebral aneurysm, nonruptured: Secondary | ICD-10-CM

## 2019-10-19 ENCOUNTER — Other Ambulatory Visit (HOSPITAL_COMMUNITY): Payer: PPO

## 2019-10-19 ENCOUNTER — Other Ambulatory Visit: Payer: Self-pay | Admitting: Neurosurgery

## 2019-10-19 DIAGNOSIS — I671 Cerebral aneurysm, nonruptured: Secondary | ICD-10-CM

## 2019-10-20 ENCOUNTER — Other Ambulatory Visit: Payer: Self-pay | Admitting: Neurosurgery

## 2019-10-26 ENCOUNTER — Other Ambulatory Visit (HOSPITAL_COMMUNITY): Payer: PPO

## 2019-10-28 ENCOUNTER — Other Ambulatory Visit (HOSPITAL_COMMUNITY): Payer: PPO

## 2019-10-29 ENCOUNTER — Other Ambulatory Visit (HOSPITAL_COMMUNITY)
Admission: RE | Admit: 2019-10-29 | Discharge: 2019-10-29 | Disposition: A | Discharge status: Home / Self Care | Payer: PPO | Source: Ambulatory Visit | Attending: Neurosurgery | Admitting: Neurosurgery

## 2019-10-29 DIAGNOSIS — Z20822 Contact with and (suspected) exposure to covid-19: Secondary | ICD-10-CM | POA: Diagnosis not present

## 2019-10-29 DIAGNOSIS — Z01812 Encounter for preprocedural laboratory examination: Secondary | ICD-10-CM | POA: Insufficient documentation

## 2019-10-29 LAB — SARS CORONAVIRUS 2 (TAT 6-24 HRS): SARS Coronavirus 2: NEGATIVE

## 2019-11-01 NOTE — H&P (Signed)
Chief Complaint   aneurysm  HPI   HPI: [name] [name] is a 79 y.o. 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  She presents today for diagnostic cerebral angiogram for further characterization.  She is without any concerns.  Patient Active Problem List   Diagnosis Date Noted  . S/P knee replacement 11/17/2015  . Primary osteoarthritis of right knee 11/17/2015  . S/P shoulder replacement 08/12/2014  . Primary osteoarthritis involving multiple joints 03/21/2014  . CKD (chronic kidney disease), stage III 12/16/2013  . Medication management 09/17/2013  . Visit for preventive health examination 06/16/2013  . Hyperglycemia 06/16/2013  . Agatston coronary artery calcium score between 200 and 399 04/13/2012  . Breast lump on left side at 9 o'clock position 02/12/2012  . Skin lesion 02/12/2012  . Vitamin B 12 deficiency 11/14/2011  . Gynecomastia 07/18/2011  . Fatigue 06/01/2011  . Chest tightness or pressure 06/01/2011  . Sleep difficulties 03/24/2011  . Intentional underdosing of medication regimen by patient due to financial hardship 03/24/2011  . ABDOMINAL BLOATING 10/10/2009  . LUMP OR MASS IN BREAST 08/28/2009  . ANEMIA 07/26/2009  . ABDOMINAL PAIN OTHER SPECIFIED SITE 07/12/2009  . 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/2009  . RENAL CALCULUS, HX OF 01/04/2009  . IRON DEFICIENCY 11/15/2008  . FIBROMYALGIA 11/15/2008  . VITAMIN D DEFICIENCY 10/10/2008  . ANEMIA-B12 DEFICIENCY 05/10/2008  . ADJ DISORDER WITH MIXED ANXIETY & DEPRESSED MOOD 05/05/2008  . DYSPHAGIA UNSPECIFIED 05/02/2008  . Personal history of other diseases of digestive system 05/02/2008  . VARICOSE VEINS, LOWER EXTREMITIES 04/12/2008  . IBS 04/12/2008  . OSTEOPENIA 04/12/2008  . Adjustment disorder with depressed mood 02/16/2008  . CHEST PAIN 02/16/2008  . Hyperlipidemia 12/24/2006  . ANXIETY 12/24/2006  . Essential hypertension 12/24/2006  . GERD  12/24/2006  . HIATAL HERNIA 12/24/2006  . Osteoarthritis 12/24/2006    PMH: Past Medical History:  Diagnosis Date  . Acute kidney failure ([place])    5 years ago  . ACUTE KIDNEY FAILURE UNSPECIFIED 07/12/2009  . Allergy   . Anxiety   . Arthritis   . CAD (coronary artery disease)    Mild nonobstructive by CT  . 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 Cataract   . Chronic kidney disease    stones  . Depression   . Diverticulosis of colon (without mention of hemorrhage)   . Fibromyalgia   . GERD (gastroesophageal reflux disease)   . Hiatal hernia   . Hyperlipidemia   . Hypertension   . Osteopenia   . PONV (postoperative nausea and vomiting)   . 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   Qualifier: Diagnosis of  By: Sharlett Iles MD [name]     PSH: Past Surgical History:  Procedure Laterality Date  . BLADDER TUMOR EXCISION  2001  . CATARACT EXTRACTION     bilateral  . CHOLECYSTECTOMY    . CYSTECTOMY  2001   benign  . ESOPHAGEAL MANOMETRY N/A 07/13/2012   Procedure: ESOPHAGEAL MANOMETRY (EM);  Surgeon: Sable Feil, MD;  Location: [facility];  Service: Endoscopy;  Laterality: N/A;  . LUMBAR DISC SURGERY     x 2 elsner  . REVERSE SHOULDER ARTHROPLASTY Right 08/12/2014   Procedure: RIGHT REVERSE TOTAL SHOULDER ARTHROPLASTY;  Surgeon: Netta Cedars, MD;  Location: [facility];  Service: Orthopedics;  Laterality: Right;  . TENDON REPAIR  11/2005   right shoulder  . TOTAL KNEE ARTHROPLASTY     left  .  TOTAL KNEE ARTHROPLASTY Right 11/17/2015   Procedure: RIGHT TOTAL KNEE ARTHROPLASTY;  Surgeon: Sydnee Cabal, MD;  Location: [facility];  Service: Orthopedics;  Laterality: Right;    (Not in a hospital admission)   SH: Social History   Tobacco Use  . Smoking status: Never Smoker  . Smokeless tobacco: Never Used  Vaping Use  . Vaping Use: Never used  Substance Use Topics  . Alcohol use: Yes    Alcohol/week: 0.0 standard drinks    Comment: rare  . Drug use: No    MEDS: Prior to  Admission medications   Medication Sig Start Date End Date Taking? Authorizing Provider  amLODipine (NORVASC) 5 MG tablet Take 1 tablet (5 mg total) by mouth daily. 09/02/19   Panosh, Standley Brooking, MD  baclofen (LIORESAL) 10 MG tablet Take 1 tablet (10 mg total) by mouth 3 (three) times daily as needed for muscle spasms. Patient not taking: Reported on 10/06/2019 07/24/16   Panosh, Standley Brooking, MD  Cyanocobalamin (B-12 PO) Take 500 mcg by mouth daily. Reported on 05/29/2015    [provider]  DULoxetine (CYMBALTA) 60 MG capsule Take 1 capsule (60 mg total) by mouth 2 (two) times daily. 09/02/19   Panosh, Standley Brooking, MD  gabapentin (NEURONTIN) 100 MG capsule Take 1 capsule (100 mg total) by mouth at bedtime. 10/06/19   Pieter Partridge, DO  metoprolol tartrate (LOPRESSOR) 25 MG tablet Take 1 tablet (25 mg total) by mouth 2 (two) times daily. 09/02/19   Panosh, Standley Brooking, MD  omeprazole (PRILOSEC) 20 MG capsule Take 1 capsule (20 mg total) by mouth 2 (two) times daily. 09/02/19   Panosh, Standley Brooking, MD  Probiotic Product (ALIGN PO) Take 1 tablet by mouth daily. Patient not taking: Reported on 10/06/2019    [provider]  rosuvastatin (CRESTOR) 40 MG tablet Take 1 tablet (40 mg total) by mouth daily. 09/02/19   Panosh, Standley Brooking, MD  Vitamin D, Ergocalciferol, (DRISDOL) 1.25 MG (50000 UT) CAPS capsule Take 50,000 Units by mouth once a week. 06/24/18   [provider]    ALLERGY: Allergies  Allergen Reactions  . Gadolinium Derivatives Hives    PER DR DERRY PT NEEDS 13HOUR PREP BEFORE FUTURE CONTRASTED MRI'S//KMS  . Aspirin Other (See Comments)    Acute renal failure   . Lactose Intolerance (Gi) Other (See Comments)    Gi upset  . Meloxicam     REACTION: acute renal failure  with hypovolemic trigger Hospital  3 2011 Pt states allergic to all NSAIDS   . Sulfamethoxazole Hives  . Tylenol [Acetaminophen] Other (See Comments)    Acute renal failure     Social History   Tobacco Use  . Smoking  status: Never Smoker  . Smokeless tobacco: Never Used  Substance Use Topics  . Alcohol use: Yes    Alcohol/week: 0.0 standard drinks    Comment: rare     Family History  Problem Relation Age of Onset  . Alzheimer's disease Mother   . Lung cancer Father        tobacco  . Kidney cancer Son   . Hypertension Brother        mom's side  . Osteoporosis Sister   . Breast cancer Cousin   . Heart disease Maternal Uncle        x 4  . Heart disease Maternal Aunt        x 4  . Diabetes Paternal Grandmother   . Diabetes Paternal Uncle  x 2  . Colon cancer Neg Hx      ROS   ROS  Exam   There were no vitals filed for this visit. General appearance: WDWN, NAD Eyes: No scleral injection Cardiovascular: Regular rate and rhythm without murmurs, rubs, gallops. No edema or variciosities. Distal pulses normal. Pulmonary: Effort normal, non-labored breathing Musculoskeletal:     Muscle tone upper extremities: Normal    Muscle tone lower extremities: Normal    Motor exam: Upper Extremities Deltoid Bicep Tricep Grip  Right 5/5 5/5 5/5 5/5  Left 5/5 5/5 5/5 5/5   Lower Extremity IP Quad PF DF EHL  Right 5/5 5/5 5/5 5/5 5/5  Left 5/5 5/5 5/5 5/5 5/5   Neurological Mental Status:    - Patient is awake, alert, oriented to person, place, month, year, and situation    - Patient is able to give a clear and coherent history.    - No signs of aphasia or neglect Cranial Nerves    - II: Visual Fields are full. PERRL    - III/IV/VI: EOMI without ptosis or diploplia.     - V: Facial sensation is grossly normal    - VII: Facial movement is symmetric.     - VIII: hearing is intact to voice    - X: Uvula elevates symmetrically    - XI: Shoulder shrug is symmetric.    - XII: tongue is midline without atrophy or fasciculations.  Sensory: Sensation grossly intact to LT   Results - Imaging/Labs   No results found for this or any previous visit (from the past 48 hour(s)).  No results  found.  IMAGING: MRI shows 8.2mm basilar tip aneurysm, possible partially thrombosed.  She has no hydrocephalus. Does contact the optic tract.   Impression/Plan   79 y.o. 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  We will proceed today with diagnostic cerebral angiogram for further workup and characterization.  We have reviewed the indications for the angiogram, the associated risks, benefits and alternatives at length.  All questions were answered and consent was obtained.  Consuella Lose, MD [facility]

## 2019-11-02 ENCOUNTER — Other Ambulatory Visit: Payer: Self-pay

## 2019-11-02 ENCOUNTER — Other Ambulatory Visit: Payer: Self-pay | Admitting: Neurosurgery

## 2019-11-02 ENCOUNTER — Ambulatory Visit (HOSPITAL_COMMUNITY)
Admission: RE | Admit: 2019-11-02 | Discharge: 2019-11-02 | Disposition: A | Discharge status: Home / Self Care | Payer: PPO | Source: Ambulatory Visit | Attending: Neurosurgery | Admitting: Neurosurgery

## 2019-11-02 DIAGNOSIS — E559 Vitamin D deficiency, unspecified: Secondary | ICD-10-CM | POA: Insufficient documentation

## 2019-11-02 DIAGNOSIS — I671 Cerebral aneurysm, nonruptured: Secondary | ICD-10-CM

## 2019-11-02 DIAGNOSIS — I2510 Atherosclerotic heart disease of native coronary artery without angina pectoris: Secondary | ICD-10-CM | POA: Insufficient documentation

## 2019-11-02 DIAGNOSIS — F419 Anxiety disorder, unspecified: Secondary | ICD-10-CM | POA: Diagnosis not present

## 2019-11-02 DIAGNOSIS — Z886 Allergy status to analgesic agent status: Secondary | ICD-10-CM | POA: Insufficient documentation

## 2019-11-02 DIAGNOSIS — N179 Acute kidney failure, unspecified: Secondary | ICD-10-CM | POA: Diagnosis not present

## 2019-11-02 DIAGNOSIS — N183 Chronic kidney disease, stage 3 unspecified: Secondary | ICD-10-CM | POA: Insufficient documentation

## 2019-11-02 DIAGNOSIS — Z8262 Family history of osteoporosis: Secondary | ICD-10-CM | POA: Diagnosis not present

## 2019-11-02 DIAGNOSIS — D649 Anemia, unspecified: Secondary | ICD-10-CM | POA: Insufficient documentation

## 2019-11-02 DIAGNOSIS — Z91041 Radiographic dye allergy status: Secondary | ICD-10-CM | POA: Diagnosis not present

## 2019-11-02 DIAGNOSIS — Z888 Allergy status to other drugs, medicaments and biological substances status: Secondary | ICD-10-CM | POA: Insufficient documentation

## 2019-11-02 DIAGNOSIS — E785 Hyperlipidemia, unspecified: Secondary | ICD-10-CM | POA: Diagnosis not present

## 2019-11-02 DIAGNOSIS — R519 Headache, unspecified: Secondary | ICD-10-CM | POA: Diagnosis not present

## 2019-11-02 DIAGNOSIS — Z96653 Presence of artificial knee joint, bilateral: Secondary | ICD-10-CM | POA: Diagnosis not present

## 2019-11-02 DIAGNOSIS — Z833 Family history of diabetes mellitus: Secondary | ICD-10-CM | POA: Insufficient documentation

## 2019-11-02 DIAGNOSIS — Z882 Allergy status to sulfonamides status: Secondary | ICD-10-CM | POA: Diagnosis not present

## 2019-11-02 DIAGNOSIS — K589 Irritable bowel syndrome without diarrhea: Secondary | ICD-10-CM | POA: Diagnosis not present

## 2019-11-02 DIAGNOSIS — M1711 Unilateral primary osteoarthritis, right knee: Secondary | ICD-10-CM | POA: Insufficient documentation

## 2019-11-02 DIAGNOSIS — Z79899 Other long term (current) drug therapy: Secondary | ICD-10-CM | POA: Insufficient documentation

## 2019-11-02 DIAGNOSIS — Z8249 Family history of ischemic heart disease and other diseases of the circulatory system: Secondary | ICD-10-CM | POA: Diagnosis not present

## 2019-11-02 DIAGNOSIS — K219 Gastro-esophageal reflux disease without esophagitis: Secondary | ICD-10-CM | POA: Insufficient documentation

## 2019-11-02 DIAGNOSIS — I129 Hypertensive chronic kidney disease with stage 1 through stage 4 chronic kidney disease, or unspecified chronic kidney disease: Secondary | ICD-10-CM | POA: Diagnosis not present

## 2019-11-02 DIAGNOSIS — M8580 Other specified disorders of bone density and structure, unspecified site: Secondary | ICD-10-CM | POA: Diagnosis not present

## 2019-11-02 DIAGNOSIS — M797 Fibromyalgia: Secondary | ICD-10-CM | POA: Diagnosis not present

## 2019-11-02 HISTORY — PX: IR ANGIO VERTEBRAL SEL VERTEBRAL UNI R MOD SED: IMG5368

## 2019-11-02 HISTORY — PX: IR ANGIO INTRA EXTRACRAN SEL INTERNAL CAROTID BILAT MOD SED: IMG5363

## 2019-11-02 LAB — CBC
HCT: 41.7 % (ref 36.0–46.0)
Hemoglobin: 13 g/dL (ref 12.0–15.0)
MCH: 28.4 pg (ref 26.0–34.0)
MCHC: 31.2 g/dL (ref 30.0–36.0)
MCV: 91 fL (ref 80.0–100.0)
Platelets: 165 10*3/uL (ref 150–400)
RBC: 4.58 MIL/uL (ref 3.87–5.11)
RDW: 14.2 % (ref 11.5–15.5)
WBC: 5.4 10*3/uL (ref 4.0–10.5)
nRBC: 0 % (ref 0.0–0.2)

## 2019-11-02 LAB — BASIC METABOLIC PANEL
Anion gap: 11 (ref 5–15)
BUN: 21 mg/dL (ref 8–23)
CO2: 24 mmol/L (ref 22–32)
Calcium: 9.7 mg/dL (ref 8.9–10.3)
Chloride: 104 mmol/L (ref 98–111)
Creatinine, Ser: 1.25 mg/dL — ABNORMAL HIGH (ref 0.44–1.00)
GFR calc Af Amer: 48 mL/min — ABNORMAL LOW (ref >60)
GFR calc non Af Amer: 41 mL/min — ABNORMAL LOW (ref >60)
Glucose, Bld: 121 mg/dL — ABNORMAL HIGH (ref 70–99)
Potassium: 3.8 mmol/L (ref 3.5–5.1)
Sodium: 139 mmol/L (ref 135–145)

## 2019-11-02 MED ORDER — FENTANYL CITRATE (PF) 100 MCG/2ML IJ SOLN
INTRAMUSCULAR | Status: AC
  Filled 2019-11-02: qty 2

## 2019-11-02 MED ORDER — HEPARIN SODIUM (PORCINE) 1000 UNIT/ML IJ SOLN
INTRAMUSCULAR | Status: AC | PRN
  Administered 2019-11-02: 2000 [IU] via INTRAVENOUS

## 2019-11-02 MED ORDER — HEPARIN SODIUM (PORCINE) 1000 UNIT/ML IJ SOLN
INTRAMUSCULAR | Status: AC
  Filled 2019-11-02: qty 1

## 2019-11-02 MED ORDER — SODIUM CHLORIDE 0.9 % IV SOLN: INTRAVENOUS | Status: DC

## 2019-11-02 MED ORDER — VERAPAMIL HCL 2.5 MG/ML IV SOLN
INTRAVENOUS | Status: AC
  Filled 2019-11-02: qty 2

## 2019-11-02 MED ORDER — LIDOCAINE HCL 1 % IJ SOLN
INTRAMUSCULAR | Status: AC | PRN
  Administered 2019-11-02: 10 mL

## 2019-11-02 MED ORDER — IOHEXOL 300 MG/ML  SOLN
150.0000 mL | Freq: Once | INTRAMUSCULAR | Status: AC | PRN
  Administered 2019-11-02: 55 mL via INTRA_ARTERIAL

## 2019-11-02 MED ORDER — FENTANYL CITRATE (PF) 100 MCG/2ML IJ SOLN
INTRAMUSCULAR | Status: AC | PRN
  Administered 2019-11-02: 25 ug via INTRAVENOUS

## 2019-11-02 MED ORDER — LIDOCAINE HCL 1 % IJ SOLN
INTRAMUSCULAR | Status: AC
  Filled 2019-11-02: qty 20

## 2019-11-02 MED ORDER — MIDAZOLAM HCL 2 MG/2ML IJ SOLN
INTRAMUSCULAR | Status: AC | PRN
  Administered 2019-11-02: 1 mg via INTRAVENOUS

## 2019-11-02 MED ORDER — NITROGLYCERIN 1 MG/10 ML FOR IR/CATH LAB
INTRA_ARTERIAL | Status: AC
  Filled 2019-11-02: qty 10

## 2019-11-02 MED ORDER — MIDAZOLAM HCL 2 MG/2ML IJ SOLN
INTRAMUSCULAR | Status: AC
  Filled 2019-11-02: qty 2

## 2019-11-02 NOTE — Brief Op Note (Signed)
  NEUROSURGERY BRIEF OPERATIVE  NOTE   PREOP DX: Basilar aneurysm  POSTOP DX: Same  PROCEDURE: Diagnostic cerebral angiogram  SURGEON: Dr. Consuella Lose, MD  ANESTHESIA: IV Sedation with Local  EBL: Minimal  SPECIMENS: None  COMPLICATIONS: None  CONDITION: Stable to recovery  FINDINGS (Full report in [facility]): 1. 9.9x9.2x8.8 mm basilar apex aneurysm 2. 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

## 2019-11-02 NOTE — Discharge Instructions (Addendum)
Angiogram, Care After This sheet gives you information about how to care for yourself after your procedure. Your health care provider may also give you more specific instructions. If you have problems or questions, contact your health care provider. What can I expect after the procedure? After the procedure, it is common to have bruising and tenderness at the catheter insertion area. Follow these instructions at home: Insertion site care  Follow instructions from your health care provider about how to take care of your insertion site. Make sure you: ? Wash your hands with soap and water before you change your bandage (dressing). If soap and water are not available, use hand sanitizer. ? Change your dressing as told by your health care provider. ? Leave stitches (sutures), skin glue, or adhesive strips in place. These skin closures may need to stay in place for 2 weeks or longer. If adhesive strip edges start to loosen and curl up, you may trim the loose edges. Do not remove adhesive strips completely unless your health care provider tells you to do that.  Do not take baths, swim, or use a hot tub until your health care provider approves.  You may shower 24-48 hours after the procedure or as told by your health care provider. ? Gently wash the site with plain soap and water. ? Pat the area dry with a clean towel. ? Do not rub the site. This may cause bleeding.  Do not apply powder or lotion to the site. Keep the site clean and dry.  Check your insertion site every day for signs of infection. Check for: ? Redness, swelling, or pain. ? Fluid or blood. ? Warmth. ? Pus or a bad smell. Activity  Rest as told by your health care provider, usually for 1-2 days.  Do not lift anything that is heavier than 10 lbs. (4.5 kg) or as told by your health care provider.  Do not drive for 24 hours if you were given a medicine to help you relax (sedative).  Do not drive or use heavy machinery while  taking prescription pain medicine. General instructions   Return to your normal activities as told by your health care provider, usually in about a week. Ask your health care provider what activities are safe for you.  If the catheter site starts bleeding, lie flat and put pressure on the site. If the bleeding does not stop, get help right away. This is a medical emergency.  Drink enough fluid to keep your urine clear or pale yellow. This helps flush the contrast dye from your body.  Take over-the-counter and prescription medicines only as told by your health care provider.  Keep all follow-up visits as told by your health care provider. This is important. Contact a health care provider if:  You have a fever or chills.  You have redness, swelling, or pain around your insertion site.  You have fluid or blood coming from your insertion site.  The insertion site feels warm to the touch.  You have pus or a bad smell coming from your insertion site.  You have bruising around the insertion site.  You notice blood collecting in the tissue around the catheter site (hematoma). The hematoma may be painful to the touch. Get help right away if:  You have severe pain at the catheter insertion area.  The catheter insertion area swells very fast.  The catheter insertion area is bleeding, and the bleeding does not stop when you hold steady pressure on the area.    The area near or just beyond the catheter insertion site becomes pale, cool, tingly, or numb. These symptoms may represent a serious problem that is an emergency. Do not wait to see if the symptoms will go away. Get medical help right away. Call your local emergency services (911 in the [place] Do not drive yourself to the hospital. Summary  After the procedure, it is common to have bruising and tenderness at the catheter insertion area.  After the procedure, it is important to rest and drink plenty of fluids.  Do not take baths,  swim, or use a hot tub until your health care provider says it is okay to do so. You may shower 24-48 hours after the procedure or as told by your health care provider.  If the catheter site starts bleeding, lie flat and put pressure on the site. If the bleeding does not stop, get help right away. This is a medical emergency. This information is not intended to replace advice given to you by your health care provider. Make sure you discuss any questions you have with your health care provider. Document Revised: 03/21/2017 Document Reviewed: 03/13/2016 Elsevier Patient Education  [place].  Cerebral Angiogram, Care After This sheet gives you information about how to care for yourself after your procedure. Your health care provider may also give you more specific instructions. If you have problems or questions, contact your health care provider. What can I expect after the procedure? After the procedure, it is common to have:  Bruising and tenderness at the catheter insertion site.  A mild headache. Follow these instructions at home: Insertion site care  Follow instructions from your health care provider about how to take care of the insertion site. Make sure you: ? Wash your hands with soap and water before and after you change your bandage (dressing). If soap and water are not available, use hand sanitizer. ? Change your dressing as told by your health care provider.  Do not take baths, swim, or use a hot tub until your health care provider approves. You may shower 24-48 hours after the procedure, or as told by your health care provider.  To clean your insertion site: ? Gently wash the site with plain soap and water. ? Pat the area dry with a clean towel. ? Do not rub the site. This may cause bleeding.  Do not apply powder or lotion to the site. Keep the site clean and dry. Infection signs Check your incision area every day for signs of infection. Check for:  Redness,  swelling, or pain.  Fluid or blood.  Warmth.  Pus or a bad smell.  Activity  Do not drive for 24 hours if you were given a sedative during your procedure.  Rest as told by your health care provider.  Do not lift anything that is heavier than 10 lb (4.5 kg), or the limit that you are told, until your health care provider says that it is safe.  Return to your normal activities as told by your health care provider, usually in about a week. Ask your health care provider what activities are safe for you. General instructions   If your insertion site starts to bleed, lie flat and put pressure on the site. If the bleeding does not stop, get help right away. This is a medical emergency.  Do not use any products that contain nicotine or tobacco, such as cigarettes, e-cigarettes, and chewing tobacco. If you need help quitting, ask your health care  provider.  Take over-the-counter and prescription medicines only as told by your health care provider.  Drink enough fluid to keep your urine pale yellow. This helps flush the contrast dye from your body.  Keep all follow-up visits as directed by your health care provider. This is important. Contact a health care provider if:  You have a fever or chills.  You have redness, swelling, or pain around your insertion site.  You have fluid or blood coming from your insertion site.  The insertion site feels warm to the touch.  You have pus or a bad smell coming from your insertion site.  You notice blood collecting in the tissue around the insertion site (hematoma). The hematoma may be painful to the touch. Get help right away if:  You have chest pain or trouble breathing.  You have severe pain or swelling at the insertion site.  The insertion area bleeds, and bleeding continues after 30 minutes of holding steady pressure on the site.  The arm or leg where the catheter was inserted is pale, cold, numb, tingling, or weak.  You have a  rash.  You have any symptoms of a stroke. "BE FAST" is an easy way to remember the main warning signs of a stroke: ? B - Balance. Signs are dizziness, sudden trouble walking, or loss of balance. ? E - Eyes. Signs are trouble seeing or a sudden change in vision. ? F - Face. Signs are sudden weakness or numbness of the face, or the face or eyelid drooping on one side. ? A - Arms. Signs are weakness or numbness in an arm. This happens suddenly and usually on one side of the body. ? S - Speech. Signs are sudden trouble speaking, slurred speech, or trouble understanding what people say. ? T - Time. Time to call emergency services. Write down what time symptoms started.  You have other signs of a stroke, such as: ? A sudden, severe headache with no known cause. ? Nausea or vomiting. ? Seizure. These symptoms may represent a serious problem that is an emergency. Do not wait to see if the symptoms will go away. Get medical help right away. Call your local emergency services (911 in the [place] Do not drive yourself to the hospital. Summary  Bruising and tenderness at the insertion site are common.  Follow your health care provider's instructions about caring for your insertion site. Change dressing and clean the area as instructed.  If your insertion site bleeds, apply direct pressure until bleeding stops.  Return to your normal activities as told by your health care provider. Ask what activities are safe.  Rest and drink plenty of fluids. This information is not intended to replace advice given to you by your health care provider. Make sure you discuss any questions you have with your health care provider. Document Revised: 10/27/2018 Document Reviewed: 10/27/2018 Elsevier Patient Education  [place]. Moderate Conscious Sedation, Adult Sedation is the use of medicines to promote relaxation and relieve discomfort and anxiety. Moderate conscious sedation is a type of sedation. Under  moderate conscious sedation, you are less alert than normal, but you are still able to respond to instructions, touch, or both. Moderate conscious sedation is used during short medical and dental procedures. It is milder than deep sedation, which is a type of sedation under which you cannot be easily woken up. It is also milder than general anesthesia, which is the use of medicines to make you unconscious. Moderate conscious sedation allows  you to return to your regular activities sooner. Tell a health care provider about:  Any allergies you have.  All medicines you are taking, including vitamins, herbs, eye drops, creams, and over-the-counter medicines.  Use of steroids (by mouth or creams).  Any problems you or family members have had with sedatives and anesthetic medicines.  Any blood disorders you have.  Any surgeries you have had.  Any medical conditions you have, such as sleep apnea.  Whether you are pregnant or may be pregnant.  Any use of cigarettes, alcohol, marijuana, or street drugs. What are the risks? Generally, this is a safe procedure. However, problems may occur, including:  Getting too much medicine (oversedation).  Nausea.  Allergic reaction to medicines.  Trouble breathing. If this happens, a breathing tube may be used to help with breathing. It will be removed when you are awake and breathing on your own.  Heart trouble.  Lung trouble. What happens before the procedure? Staying hydrated Follow instructions from your health care provider about hydration, which may include:  Up to 2 hours before the procedure - you may continue to drink clear liquids, such as water, clear fruit juice, black coffee, and plain tea. Eating and drinking restrictions Follow instructions from your health care provider about eating and drinking, which may include:  8 hours before the procedure - stop eating heavy meals or foods such as meat, fried foods, or fatty foods.  6  hours before the procedure - stop eating light meals or foods, such as toast or cereal.  6 hours before the procedure - stop drinking milk or drinks that contain milk.  2 hours before the procedure - stop drinking clear liquids. Medicine Ask your health care provider about:  Changing or stopping your regular medicines. This is especially important if you are taking diabetes medicines or blood thinners.  Taking medicines such as aspirin and ibuprofen. These medicines can thin your blood. Do not take these medicines before your procedure if your health care provider instructs you not to.  Tests and exams  You will have a physical exam.  You may have blood tests done to show: ? How well your kidneys and liver are working. ? How well your blood can clot. General instructions  Plan to have someone take you home from the hospital or clinic.  If you will be going home right after the procedure, plan to have someone with you for 24 hours. What happens during the procedure?  An IV tube will be inserted into one of your veins.  Medicine to help you relax (sedative) will be given through the IV tube.  The medical or dental procedure will be performed. What happens after the procedure?  Your blood pressure, heart rate, breathing rate, and blood oxygen level will be monitored often until the medicines you were given have worn off.  Do not drive for 24 hours. This information is not intended to replace advice given to you by your health care provider. Make sure you discuss any questions you have with your health care provider. Document Revised: 03/21/2017 Document Reviewed: 07/29/2015 Elsevier Patient Education  2020 [employer].

## 2019-11-19 ENCOUNTER — Ambulatory Visit: Payer: PPO | Admitting: Nurse Practitioner

## 2019-11-25 DIAGNOSIS — Z6832 Body mass index (BMI) 32.0-32.9, adult: Secondary | ICD-10-CM | POA: Diagnosis not present

## 2019-11-25 DIAGNOSIS — I10 Essential (primary) hypertension: Secondary | ICD-10-CM | POA: Diagnosis not present

## 2019-11-25 DIAGNOSIS — I671 Cerebral aneurysm, nonruptured: Secondary | ICD-10-CM | POA: Diagnosis not present

## 2019-11-30 ENCOUNTER — Other Ambulatory Visit (HOSPITAL_COMMUNITY): Payer: Self-pay | Admitting: Neurosurgery

## 2019-11-30 DIAGNOSIS — I671 Cerebral aneurysm, nonruptured: Secondary | ICD-10-CM

## 2019-12-03 ENCOUNTER — Other Ambulatory Visit: Payer: Self-pay | Admitting: Neurosurgery

## 2019-12-14 ENCOUNTER — Other Ambulatory Visit (HOSPITAL_COMMUNITY)
Admission: RE | Admit: 2019-12-14 | Discharge: 2019-12-14 | Disposition: A | Discharge status: Home / Self Care | Payer: PPO | Source: Ambulatory Visit | Attending: Neurosurgery | Admitting: Neurosurgery

## 2019-12-14 DIAGNOSIS — M8580 Other specified disorders of bone density and structure, unspecified site: Secondary | ICD-10-CM | POA: Diagnosis present

## 2019-12-14 DIAGNOSIS — Z888 Allergy status to other drugs, medicaments and biological substances status: Secondary | ICD-10-CM | POA: Diagnosis not present

## 2019-12-14 DIAGNOSIS — K589 Irritable bowel syndrome without diarrhea: Secondary | ICD-10-CM | POA: Diagnosis present

## 2019-12-14 DIAGNOSIS — M419 Scoliosis, unspecified: Secondary | ICD-10-CM | POA: Diagnosis present

## 2019-12-14 DIAGNOSIS — Z96611 Presence of right artificial shoulder joint: Secondary | ICD-10-CM | POA: Diagnosis present

## 2019-12-14 DIAGNOSIS — I2510 Atherosclerotic heart disease of native coronary artery without angina pectoris: Secondary | ICD-10-CM | POA: Diagnosis present

## 2019-12-14 DIAGNOSIS — E739 Lactose intolerance, unspecified: Secondary | ICD-10-CM | POA: Diagnosis present

## 2019-12-14 DIAGNOSIS — M797 Fibromyalgia: Secondary | ICD-10-CM | POA: Diagnosis present

## 2019-12-14 DIAGNOSIS — Z8249 Family history of ischemic heart disease and other diseases of the circulatory system: Secondary | ICD-10-CM | POA: Diagnosis not present

## 2019-12-14 DIAGNOSIS — Z79899 Other long term (current) drug therapy: Secondary | ICD-10-CM | POA: Diagnosis not present

## 2019-12-14 DIAGNOSIS — Z886 Allergy status to analgesic agent status: Secondary | ICD-10-CM | POA: Diagnosis not present

## 2019-12-14 DIAGNOSIS — Z20822 Contact with and (suspected) exposure to covid-19: Secondary | ICD-10-CM | POA: Diagnosis present

## 2019-12-14 DIAGNOSIS — N183 Chronic kidney disease, stage 3 unspecified: Secondary | ICD-10-CM | POA: Diagnosis present

## 2019-12-14 DIAGNOSIS — Z882 Allergy status to sulfonamides status: Secondary | ICD-10-CM | POA: Diagnosis not present

## 2019-12-14 DIAGNOSIS — I129 Hypertensive chronic kidney disease with stage 1 through stage 4 chronic kidney disease, or unspecified chronic kidney disease: Secondary | ICD-10-CM | POA: Diagnosis not present

## 2019-12-14 DIAGNOSIS — R198 Other specified symptoms and signs involving the digestive system and abdomen: Secondary | ICD-10-CM | POA: Diagnosis not present

## 2019-12-14 DIAGNOSIS — Z85828 Personal history of other malignant neoplasm of skin: Secondary | ICD-10-CM | POA: Diagnosis not present

## 2019-12-14 DIAGNOSIS — K219 Gastro-esophageal reflux disease without esophagitis: Secondary | ICD-10-CM | POA: Diagnosis present

## 2019-12-14 DIAGNOSIS — Z820 Family history of epilepsy and other diseases of the nervous system: Secondary | ICD-10-CM | POA: Diagnosis not present

## 2019-12-14 DIAGNOSIS — Z7902 Long term (current) use of antithrombotics/antiplatelets: Secondary | ICD-10-CM | POA: Diagnosis not present

## 2019-12-14 DIAGNOSIS — E785 Hyperlipidemia, unspecified: Secondary | ICD-10-CM | POA: Diagnosis present

## 2019-12-14 DIAGNOSIS — R150 Incomplete defecation: Secondary | ICD-10-CM | POA: Diagnosis not present

## 2019-12-14 DIAGNOSIS — Z91041 Radiographic dye allergy status: Secondary | ICD-10-CM | POA: Diagnosis not present

## 2019-12-14 DIAGNOSIS — K5730 Diverticulosis of large intestine without perforation or abscess without bleeding: Secondary | ICD-10-CM | POA: Diagnosis not present

## 2019-12-14 DIAGNOSIS — I671 Cerebral aneurysm, nonruptured: Secondary | ICD-10-CM | POA: Diagnosis present

## 2019-12-14 DIAGNOSIS — Z96653 Presence of artificial knee joint, bilateral: Secondary | ICD-10-CM | POA: Diagnosis present

## 2019-12-14 DIAGNOSIS — K5900 Constipation, unspecified: Secondary | ICD-10-CM | POA: Diagnosis not present

## 2019-12-14 DIAGNOSIS — Z801 Family history of malignant neoplasm of trachea, bronchus and lung: Secondary | ICD-10-CM | POA: Diagnosis not present

## 2019-12-14 DIAGNOSIS — K582 Mixed irritable bowel syndrome: Secondary | ICD-10-CM | POA: Diagnosis not present

## 2019-12-14 DIAGNOSIS — Z8051 Family history of malignant neoplasm of kidney: Secondary | ICD-10-CM | POA: Diagnosis not present

## 2019-12-14 DIAGNOSIS — Z01812 Encounter for preprocedural laboratory examination: Secondary | ICD-10-CM | POA: Insufficient documentation

## 2019-12-14 LAB — SARS CORONAVIRUS 2 (TAT 6-24 HRS): SARS Coronavirus 2: NEGATIVE

## 2019-12-14 NOTE — Progress Notes (Signed)
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 Kathyrn [name], hiatal hernia, GERD and diverticulitis. Past cholecystectomy, knee and shoulder replacement.   She was referred to our office [name] Dr. Regis [name] for further evaluation regarding irritable bowel syndrome.  She complains of having alternating constipation and diarrhea for the past several months.  She describes having 2-3 soft to loose stools then feels constipated and bloated.  Describes her stools as peanut butter consistency which are messy and difficult to clean up which is bothersome to her.  She often sits in the bathroom for least 20 minutes before she feels emptied.  Her stool color is brown to darker brown but not black.  No rectal bleeding.  She remembered taking align probiotic 6 or 7 years ago for similar symptoms which was beneficial.  She recently restarted align probiotic once daily and she is having firmer bowel movements daily for the past week or so.  No watery diarrhea.  No recent antibiotics.  Limited dairy products.  She has intermittent left lower quadrant discomfort which started 1 year ago.  She reports having a history of diverticulosis but denies ever being treated for diverticulitis.  She is now experiencing mild left lower quadrant discomfort before she passes a bowel movement and his pain resolves after defecation.  No lower abdominal pain at this time.  She has a remote history of GERD for which she takes Prilosec 20 mg daily for  the past 20 years.  She occasionally has difficulty swallowing pills without difficulty swallowing liquids or solid foods.  No heartburn.  She was recently started on aspirin, no other NSAIDs.  She reported taking Meloxicam in 2010 which resulted in acute renal failure.   CBC Latest Ref Rng & Units 11/02/2019 09/01/2019 07/24/2016  WBC 4.0 - 10.5 K/uL 5.4 6.3 5.5  Hemoglobin 12.0 - 15.0 g/dL 13.0 14.2 13.4  Hematocrit 36 - 46 % 41.7 42.9 40.4  Platelets 150 - 400 K/uL 165 191.0 172.0   CMP Latest Ref Rng & Units 11/02/2019 09/01/2019 05/01/2018  Glucose 70 - 99 mg/dL 121(H) 114(H) -  BUN 8 - 23 mg/dL 21 28(H) 28(A)  Creatinine 0.44 - 1.00 mg/dL 1.25(H) 1.18 1.3(A)  Sodium 135 - 145 mmol/L 139 138 138  Potassium 3.5 - 5.1 mmol/L 3.8 3.9 4.2  Chloride 98 - 111 mmol/L 104 99 -  CO2 22 - 32 mmol/L 24 29 -  Calcium 8.9 - 10.3 mg/dL 9.7 10.1 -  Total Protein 6.0 - 8.3 g/dL - 7.1 -  Total Bilirubin 0.2 - 1.2 mg/dL - 0.7 -  Alkaline Phos 39 - 117 U/L - 58 -  AST 0 - 37 U/L - 20 -  ALT 0 - 35 U/L - 13 -    Brain MRI 10/07/2019:  Atrophy with moderate chronic microvascular ischemic change throughout the white matter. No acute infarct 8.5 mm aneurysm basilar tip with mass-effect on the hypothalamus and left optic tract.  Esophageal manometry 07/16/2012: mild lower esophageal sphincter incompetency  EGD 08/13/2011: Large hiatal hernia. Gastritis.  EGD 07/06/2008: Normal esophagus. The esophagus was dilated with a 59F.Maloney dilator.  3cm Hiatal hernia. Normal stomach. Normal duodenum.   Colonoscopy 06/29/2008 [name] Dr. Sharlett [name]: Diverticulosis descending and sigmoid colon.    Past Medical History:  Diagnosis Date  . Acute kidney failure ([name] [name])    5 years ago  . ACUTE KIDNEY FAILURE UNSPECIFIED 07/12/2009  . Allergy   . Anxiety   . Arthritis   . CAD (coronary artery disease)    Mild nonobstructive [name] CT  . 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 Cataract   . Chronic kidney disease    stones   . Depression   . Diverticulosis of colon (without mention of hemorrhage)   . Fibromyalgia   . GERD (gastroesophageal reflux disease)   . Hiatal hernia   . Hyperlipidemia   . Hypertension   . Osteopenia   . PONV (postoperative nausea and vomiting)   . 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   Qualifier: Diagnosis of  [name]: [name] Iles [name] [name] [name]    Past Surgical History:  Procedure Laterality Date  . BLADDER TUMOR EXCISION  2001  . CATARACT EXTRACTION     bilateral  . CHOLECYSTECTOMY    . CYSTECTOMY  2001   benign  . ESOPHAGEAL MANOMETRY N/A 07/13/2012   Procedure: ESOPHAGEAL MANOMETRY (EM);  Surgeon: [name] Feil, [name];  Location: [facility];  Service: Endoscopy;  Laterality: N/A;  . IR ANGIO INTRA EXTRACRAN SEL INTERNAL CAROTID BILAT MOD SED  11/02/2019  . IR ANGIO VERTEBRAL SEL VERTEBRAL UNI R MOD SED  11/02/2019  . LUMBAR DISC SURGERY     x 2 [facility]  . REVERSE SHOULDER ARTHROPLASTY Right 08/12/2014   Procedure: RIGHT REVERSE TOTAL SHOULDER ARTHROPLASTY;  Surgeon: Netta Cedars, [name];  Location: [facility];  Service: Orthopedics;  Laterality: Right;  . TENDON REPAIR  11/2005   right shoulder  . TOTAL KNEE ARTHROPLASTY     left  . TOTAL KNEE ARTHROPLASTY Right 11/17/2015   Procedure: RIGHT TOTAL KNEE ARTHROPLASTY;  Surgeon: Sydnee Cabal, [name];  Location: [facility];  Service: Orthopedics;  Laterality: Right;   Family History: Father died 49 lung cancer, history of alcoholism.  Mother died age 91 years  Alzheimer's disease. Paternal grandmother and paternal uncle with diabetes. Brother with hypertension. Son with kidney cancer.   Social History:  Widow. She is a nonsmoker. One alcohol beverage every 3 years.   Allergies  Allergen Reactions  . Gadolinium Derivatives Hives    PER DR DERRY PT NEEDS 13HOUR PREP BEFORE FUTURE CONTRASTED MRI'S//KMS  . Aspirin Other (See Comments)    Acute renal failure   . Lactose Intolerance (Gi) Other (See Comments)    Gi upset  . Meloxicam     REACTION:  acute renal failure  with hypovolemic trigger [facility]  3 2011 Pt states allergic to all NSAIDS   . Sulfamethoxazole Hives  . Tylenol [Acetaminophen] Other (See Comments)    Acute renal failure       Outpatient Encounter Medications as of 12/15/2019  Medication Sig  . amLODipine (NORVASC) 5 MG tablet Take 1 tablet (5 mg total) [name] mouth daily.  . baclofen (LIORESAL) 10 MG tablet Take 1 tablet (10 mg total) [name] mouth 3 (three) times daily as needed for muscle spasms. (Patient not taking: Reported on 10/06/2019)  . Cyanocobalamin (B-12 PO) Take 500 mcg [name] mouth daily. Reported on 05/29/2015  . DULoxetine (CYMBALTA) 60 MG capsule Take 1 capsule (60 mg total) [name] mouth 2 (two) times daily.  Marland Kitchen  gabapentin (NEURONTIN) 100 MG capsule Take 1 capsule (100 mg total) [name] mouth at bedtime.  . metoprolol tartrate (LOPRESSOR) 25 MG tablet Take 1 tablet (25 mg total) [name] mouth 2 (two) times daily.  Marland Kitchen omeprazole (PRILOSEC) 20 MG capsule Take 1 capsule (20 mg total) [name] mouth 2 (two) times daily.  . Probiotic Product (ALIGN PO) Take 1 tablet [name] mouth daily. (Patient not taking: Reported on 10/06/2019)  . rosuvastatin (CRESTOR) 40 MG tablet Take 1 tablet (40 mg total) [name] mouth daily.  . Vitamin D, Ergocalciferol, (DRISDOL) 1.25 MG (50000 UT) CAPS capsule Take 50,000 Units [name] mouth once a week.   No facility-administered encounter medications on file as of 12/15/2019.     REVIEW OF SYSTEMS:   Gen: Denies fever, sweats or chills. No weight loss.  CV: Denies chest pain, palpitations or edema. Resp: Denies cough, shortness of breath of hemoptysis.  GI: See HPI. GU : Denies urinary burning, blood in urine, increased urinary frequency or incontinence. MS: + lower back and arthritis pain. Derm: Denies rash, itchiness, skin lesions or unhealing ulcers. Psych: + depression.  Heme: Denies bruising, bleeding. Neuro:  See HPI.  Endo:  Denies any problems with DM, thyroid or adrenal function.   PHYSICAL EXAM: BP (!)  96/50 (BP Location: Left Arm, Patient Position: Sitting, Cuff Size: Normal)   Pulse 72 Comment: irregular  Ht 4' 11.5" (1.511 m) Comment: height measured without shoes  Wt 163 lb 6 oz (74.1 kg)   BMI 32.45 kg/m  General: 79 year old female in no acute distress. Head: Normocephalic and atraumatic. Eyes:  Sclerae non-icteric, conjunctive pink. Ears: Normal auditory acuity. Mouth: Dentition intact. No ulcers or lesions.  Neck: Supple, no lymphadenopathy or thyromegaly.  Lungs: Clear bilaterally to auscultation without wheezes, crackles or rhonchi. Heart: Regular rate and rhythm. No murmur, rub or gallop appreciated.  Abdomen: Soft, nontender, non distended. No masses. No hepatosplenomegaly. Normoactive bowel sounds x 4 quadrants.  Rectal: Deferred.  Musculoskeletal: Symmetrical with no gross deformities. Skin: Warm and dry. No rash or lesions on visible extremities. Extremities: No edema. Neurological: Alert oriented x 4, no focal deficits.  Psychological:  Alert and cooperative. Normal mood and affect.  ASSESSMENT AND PLAN:  53. 79 year old female with IBS, alternating constipation and diarrhea. Symptoms improving on Align probiotic.  -Continue Align probiotic daily -Benefiber 1 tablespoon daily if tolerated -MiraLAX nightly as needed for constipation  2.  History of GERD.  Hiatal hernia.  -Continue Omeprazole 20 mg daily  3.  Left lower quadrant abdominal pain, intermittent.  No current left lower quadrant abdominal pain. -Patient to call our office if her left lower quadrant abdominal pain recurs and persists.  I discussed scheduling an abdominal/pelvic CT to rule out diverticulitis if her pain recurs.  4.  Colon cancer screening.  Last colonoscopy 06/2008 was normal. -Screening colonoscopy deferred at this time due to the patient having a basilar artery aneurysm scheduled for stent supported coil embolization 8/27. -Patient to follow-up in the office with Dr. Ardis Hughs in 3 months to  further discuss scheduling a screening colonoscopy  2. Coronary artery disease, nonobstructive. Patient is not followed [name] cardiology.   3. CKD stage III   4.  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 Kathyrn [name]    CC:  Panosh, Standley Brooking, [name]

## 2019-12-15 ENCOUNTER — Ambulatory Visit: Payer: PPO | Admitting: Nurse Practitioner

## 2019-12-15 ENCOUNTER — Encounter (HOSPITAL_COMMUNITY): Payer: Self-pay | Admitting: Neurosurgery

## 2019-12-15 ENCOUNTER — Encounter: Payer: Self-pay | Admitting: Nurse Practitioner

## 2019-12-15 VITALS — BP 96/50 | HR 72 | Ht 59.5 in | Wt 163.4 lb

## 2019-12-15 DIAGNOSIS — R150 Incomplete defecation: Secondary | ICD-10-CM

## 2019-12-15 DIAGNOSIS — K5730 Diverticulosis of large intestine without perforation or abscess without bleeding: Secondary | ICD-10-CM | POA: Diagnosis not present

## 2019-12-15 DIAGNOSIS — K5900 Constipation, unspecified: Secondary | ICD-10-CM

## 2019-12-15 DIAGNOSIS — R198 Other specified symptoms and signs involving the digestive system and abdomen: Secondary | ICD-10-CM | POA: Insufficient documentation

## 2019-12-15 DIAGNOSIS — K582 Mixed irritable bowel syndrome: Secondary | ICD-10-CM | POA: Diagnosis not present

## 2019-12-15 NOTE — Patient Instructions (Addendum)
If you are age 79 or older, your body mass index should be between 23-30. Your Body mass index is 32.45 kg/m. If this is out of the aforementioned range listed, please consider follow up with your Primary Care Provider.  If you are age 36 or younger, your body mass index should be between 19-25. Your Body mass index is 32.45 kg/m. If this is out of the aformentioned range listed, please consider follow up with your Primary Care Provider.   Continue Align.  Please call the office if your left lower abdominal pain reoccurs.  Please purchase the following medications over the counter and take as directed: Benefiber:  Take one tablespoon as directed, daily Miralax: Take daily at bedtime as needed for constipation  Please follow up with Dr. Ardis Hughs in 3 months to further discuss colonoscopy.  Thank you for entrusting me with your care and for choosing [facility], [place]

## 2019-12-15 NOTE — Progress Notes (Signed)
I agree with the above note, plan 

## 2019-12-16 ENCOUNTER — Other Ambulatory Visit: Payer: Self-pay

## 2019-12-16 ENCOUNTER — Encounter (HOSPITAL_COMMUNITY): Payer: Self-pay | Admitting: Neurosurgery

## 2019-12-16 ENCOUNTER — Other Ambulatory Visit: Payer: Self-pay | Admitting: Student

## 2019-12-16 NOTE — Progress Notes (Signed)
Anesthesia Chart Review:  Case: [id number] Date/Time: 12/17/19 0945   Procedure: 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 (N/A )   Anesthesia type: General   Pre-op diagnosis: I67.1 Cerebral aneurysm, nonruptured   [facility]   Surgeons: Consuella Lose, MD      DISCUSSION: Patient is a 79 year old female scheduled for the above procedure.   History includes never smoker, post-operative N/V, CAD (mild non-obstructive CAD by CTA 03/2012, < 30% LAD, mid D1, RCA, risk factor modification), HTN, cerebral aneurysm, HLD, GERD, hiatal hernia, CKD, scoliosis, anemia, anxiety, depression, somatization disorder, skin cancer ([facility]). IBS. BMI is consistent with obesity.  Last cardiology visit with Dr. Percival Spanish in 2017 with as needed follow-up. Last EKG was > 1 year ago, so would meet anesthesia guidelines for updated preoperative EKG.  She is on ASA and Effient for procedure. 2nd COVID-19 vaccine 06/02/19.  Presurgical COVID-19 test negative on 12/14/19. Anesthesia team to evaluate on the day of surgery.     VS:  BP Readings from Last 3 Encounters:  12/15/19 (!) 96/50  11/02/19 (!) 107/58  10/06/19 (!) 149/83   Pulse Readings from Last 3 Encounters:  12/15/19 72  11/02/19 72  10/06/19 75    PROVIDERS: Panosh, Standley Brooking, MD is PCP who referred patient to neurosurgery for treatment of basilar aneurysm. Owens Loffler, MD is GI Metta Clines, DO is neurologist - She is not followed routinely by cardiology, but has seen Minus Breeding, MD periodically between 04/2012 and 06/2015. Initially seen following 03/2012 ED visit for chest pain with only mild CAD by CAD and symptoms not felt to be cardiac in nature. Last evaluation 07/20/15 for preoperative evaluation prior to TKR. No concerning symptoms and reasonable functional status. She was cleared for that procedure and as needed cardiology follow-up recommended.    LABS: For day of surgery. As of  11/02/19, labs showed Cr 1.25, glucose 121, H/H 13.0/41.7, PLT 165K.   IMAGES: Diagnostic cerebral angiogram 11/02/19: IMPRESSION: 1. Wide necked basilar apex aneurysm measures approximately 9.9 mm in maximal dimension 2. 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   EKG: Last EKG noted is from 07/21/15 (NSR, anterior infarct, age undetermined).   CV: Carotid US 05/19/12: Impression: Mild heterogeneous plaque noted bilateral. 0-39% bilateral ICA stenosis. Patent vertebral arteries with antegrade flow.   CT Coronary 04/01/12: Impression:  1)  No hemodynamically significant CAD Right dominant LM: Normal LAD: < 30% calcific plaque in the proximal and mid vessel. Distal vessel normal. D1: Large vessel with < 30% calcific plaque in mid vessel. CX: Normal. OM1: Large vessel normal. AV Groove: Normal. RCA: Large dominate vessel. < 30% calcific plaque in the proximal, mid, and distal vessel. PDA: Normal.  PLB's: One large branch and distal vessel trifurcates and is normal. 2)  Calcium score 206 67th percentile for age and sex matched  controls  3)  Normal ascending aorta  - Patient will have aggressive risk factor modification and F/U with  Dr Percival Spanish.    Past Medical History:  Diagnosis Date  . Acute kidney failure ([facility])    5 years ago  . ACUTE KIDNEY FAILURE UNSPECIFIED 07/12/2009  . Allergy   . Anemia   . Anxiety   . Arthritis   . Basal cell carcinoma   . Blood clot in vein    states it was not deep vein  . CAD (coronary artery disease)    Mild nonobstructive by CT  . Cataract   .  Cerebral aneurysm without rupture   . Chronic kidney disease    stones  . Depression    around the time of her husband's death  . Diverticulosis of colon (without mention of hemorrhage)   . Fibromyalgia   . GERD (gastroesophageal reflux disease)   . Headache   . Hiatal hernia   . History of kidney stones   . Hyperlipidemia   . Hypertension   . Interstitial  cystitis   . Kidney stone   . Osteopenia   . PONV (postoperative nausea and vomiting)   . Scoliosis   . 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 BLADDER TUMOR EXCISION  2001  . CATARACT EXTRACTION     bilateral  . CHOLECYSTECTOMY    . CYSTECTOMY  2001   benign  . ESOPHAGEAL MANOMETRY N/A 07/13/2012   Procedure: ESOPHAGEAL MANOMETRY (EM);  Surgeon: Sable Feil, MD;  Location: [facility];  Service: Endoscopy;  Laterality: N/A;  . HIATAL HERNIA REPAIR    . IR ANGIO INTRA EXTRACRAN SEL INTERNAL CAROTID BILAT MOD SED  11/02/2019  . IR ANGIO VERTEBRAL SEL VERTEBRAL UNI R MOD SED  11/02/2019  . LUMBAR DISC SURGERY     x 2 [facility]  . REVERSE SHOULDER ARTHROPLASTY Right 08/12/2014   Procedure: RIGHT REVERSE TOTAL SHOULDER ARTHROPLASTY;  Surgeon: Netta Cedars, MD;  Location: [facility];  Service: Orthopedics;  Laterality: Right;  . TENDON REPAIR Right 11/2005   right shoulder  . TOTAL KNEE ARTHROPLASTY Left    left  . TOTAL KNEE ARTHROPLASTY Right 11/17/2015   Procedure: RIGHT TOTAL KNEE ARTHROPLASTY;  Surgeon: Sydnee Cabal, MD;  Location: [facility];  Service: Orthopedics;  Laterality: Right;    MEDICATIONS: No current facility-administered medications for this encounter.   Marland Kitchen amLODipine (NORVASC) 5 MG tablet  . BAYER LOW DOSE 81 MG chewable tablet  . Cyanocobalamin (B-12 PO)  . DULoxetine (CYMBALTA) 60 MG capsule  . gabapentin (NEURONTIN) 100 MG capsule  . metoprolol tartrate (LOPRESSOR) 25 MG tablet  . omeprazole (PRILOSEC) 20 MG capsule  . prasugrel (EFFIENT) 10 MG TABS tablet  . Probiotic Product (ALIGN PO)  . rosuvastatin (CRESTOR) 40 MG tablet  . Vitamin D, Ergocalciferol, (DRISDOL) 1.25 MG (50000 UT) CAPS capsule     Myra Gianotti, PA-C Surgical Short Stay/Anesthesiology [facility] Phone [phone] [facility] Phone [phone] 12/16/2019 1:08 PM

## 2019-12-16 NOTE — H&P (Signed)
Chief Complaint   aneurysm  HPI   HPI: [name] [name] is a 79 y.o. 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  She underwent diagnostic cerebral angiogram for further characterization it was also noted to have a smaller anterior communicating artery aneurysm.  She presents today for treatment of the large basilar apex aneurysm VS stent supported coil embolization.  She is without any concerns.   She has been taking 81 mg aspirin and Effient 10 mg daily and preparation for the procedure today  Patient Active Problem List   Diagnosis Date Noted  . Altered bowel function 12/15/2019  . S/P knee replacement 11/17/2015  . Primary osteoarthritis of right knee 11/17/2015  . S/P shoulder replacement 08/12/2014  . Primary osteoarthritis involving multiple joints 03/21/2014  . CKD (chronic kidney disease), stage III 12/16/2013  . Medication management 09/17/2013  . Visit for preventive health examination 06/16/2013  . Hyperglycemia 06/16/2013  . Agatston coronary artery calcium score between 200 and 399 04/13/2012  . Breast lump on left side at 9 o'clock position 02/12/2012  . Skin lesion 02/12/2012  . Vitamin B 12 deficiency 11/14/2011  . Gynecomastia 07/18/2011  . Fatigue 06/01/2011  . Chest tightness or pressure 06/01/2011  . Sleep difficulties 03/24/2011  . Intentional underdosing of medication regimen by patient due to financial hardship 03/24/2011  . ABDOMINAL BLOATING 10/10/2009  . LUMP OR MASS IN BREAST 08/28/2009  . ANEMIA 07/26/2009  . ABDOMINAL PAIN OTHER SPECIFIED SITE 07/12/2009  . 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/2009  . RENAL CALCULUS, HX OF 01/04/2009  . IRON DEFICIENCY 11/15/2008  . FIBROMYALGIA 11/15/2008  . VITAMIN D DEFICIENCY 10/10/2008  . ANEMIA-B12 DEFICIENCY 05/10/2008  . ADJ DISORDER WITH MIXED ANXIETY & DEPRESSED MOOD 05/05/2008  . DYSPHAGIA UNSPECIFIED 05/02/2008  . History of IBS 05/02/2008  . VARICOSE VEINS, LOWER  EXTREMITIES 04/12/2008  . IBS 04/12/2008  . OSTEOPENIA 04/12/2008  . Adjustment disorder with depressed mood 02/16/2008  . CHEST PAIN 02/16/2008  . Hyperlipidemia 12/24/2006  . ANXIETY 12/24/2006  . Essential hypertension 12/24/2006  . GERD 12/24/2006  . HIATAL HERNIA 12/24/2006  . Osteoarthritis 12/24/2006    PMH: Past Medical History:  Diagnosis Date  . Acute kidney failure ([place])    5 years ago  . ACUTE KIDNEY FAILURE UNSPECIFIED 07/12/2009  . Allergy   . Anxiety   . Arthritis   . Basal cell carcinoma   . CAD (coronary artery disease)    Mild nonobstructive by CT  . Cataract   . Cerebral aneurysm without rupture   . Chronic kidney disease    stones  . Depression   . Diverticulosis of colon (without mention of hemorrhage)   . Fibromyalgia   . GERD (gastroesophageal reflux disease)   . Hiatal hernia   . History of kidney stones   . Hyperlipidemia   . Hypertension   . Interstitial cystitis   . Kidney stone   . Osteopenia   . PONV (postoperative nausea and vomiting)   . Scoliosis   . 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   Qualifier: Diagnosis of  By: Sharlett Iles MD [name]     PSH: Past Surgical History:  Procedure Laterality Date  . BLADDER TUMOR EXCISION  2001  . CATARACT EXTRACTION     bilateral  . CHOLECYSTECTOMY    . CYSTECTOMY  2001   benign  . ESOPHAGEAL MANOMETRY N/A 07/13/2012   Procedure: ESOPHAGEAL MANOMETRY (EM);  Surgeon: Sable Feil, MD;  Location:  [facility];  Service: Endoscopy;  Laterality: N/A;  . HIATAL HERNIA REPAIR    . IR ANGIO INTRA EXTRACRAN SEL INTERNAL CAROTID BILAT MOD SED  11/02/2019  . IR ANGIO VERTEBRAL SEL VERTEBRAL UNI R MOD SED  11/02/2019  . LUMBAR DISC SURGERY     x 2 elsner  . REVERSE SHOULDER ARTHROPLASTY Right 08/12/2014   Procedure: RIGHT REVERSE TOTAL SHOULDER ARTHROPLASTY;  Surgeon: Netta Cedars, MD;  Location: [facility];  Service: Orthopedics;  Laterality: Right;  . TENDON REPAIR Right 11/2005   right shoulder  .  TOTAL KNEE ARTHROPLASTY Left    left  . TOTAL KNEE ARTHROPLASTY Right 11/17/2015   Procedure: RIGHT TOTAL KNEE ARTHROPLASTY;  Surgeon: Sydnee Cabal, MD;  Location: [facility];  Service: Orthopedics;  Laterality: Right;    No medications prior to admission.    SH: Social History   Tobacco Use  . Smoking status: Never Smoker  . Smokeless tobacco: Never Used  Vaping Use  . Vaping Use: Never used  Substance Use Topics  . Alcohol use: Yes    Alcohol/week: 0.0 standard drinks    Comment: rare  . Drug use: No    MEDS: Prior to Admission medications   Medication Sig Start Date End Date Taking? Authorizing Provider  amLODipine (NORVASC) 5 MG tablet Take 1 tablet (5 mg total) by mouth daily. 09/02/19   Panosh, Standley Brooking, MD  BAYER LOW DOSE 81 MG chewable tablet Chew 81 mg by mouth daily. 12/09/19   [provider]  Cyanocobalamin (B-12 PO) Take 500 mcg by mouth daily. Reported on 05/29/2015    [provider]  DULoxetine (CYMBALTA) 60 MG capsule Take 1 capsule (60 mg total) by mouth 2 (two) times daily. 09/02/19   Panosh, Standley Brooking, MD  gabapentin (NEURONTIN) 100 MG capsule Take 1 capsule (100 mg total) by mouth at bedtime. 10/06/19   Pieter Partridge, DO  metoprolol tartrate (LOPRESSOR) 25 MG tablet Take 1 tablet (25 mg total) by mouth 2 (two) times daily. 09/02/19   Panosh, Standley Brooking, MD  omeprazole (PRILOSEC) 20 MG capsule Take 1 capsule (20 mg total) by mouth 2 (two) times daily. 09/02/19   Panosh, Standley Brooking, MD  prasugrel (EFFIENT) 10 MG TABS tablet Take 10 mg by mouth daily.    [provider]  Probiotic Product (ALIGN PO) Take 1 tablet by mouth daily.     [provider]  rosuvastatin (CRESTOR) 40 MG tablet Take 1 tablet (40 mg total) by mouth daily. 09/02/19   Panosh, Standley Brooking, MD  Vitamin D, Ergocalciferol, (DRISDOL) 1.25 MG (50000 UT) CAPS capsule Take 50,000 Units by mouth once a week. 06/24/18   [provider]    ALLERGY: Allergies  Allergen Reactions    . Gadolinium Derivatives Hives    PER DR DERRY PT NEEDS 13HOUR PREP BEFORE FUTURE CONTRASTED MRI'S//KMS  . Aspirin Other (See Comments)    Acute renal failure   . Lactose Intolerance (Gi) Other (See Comments)    Gi upset  . Meloxicam     REACTION: acute renal failure  with hypovolemic trigger Hospital  3 2011 Pt states allergic to all NSAIDS   . Sulfamethoxazole Hives  . Tylenol [Acetaminophen] Other (See Comments)    Acute renal failure     Social History   Tobacco Use  . Smoking status: Never Smoker  . Smokeless tobacco: Never Used  Substance Use Topics  . Alcohol use: Yes    Alcohol/week: 0.0 standard drinks  Comment: rare     Family History  Problem Relation Age of Onset  . Alzheimer's disease Mother   . Lung cancer Father        tobacco  . Kidney cancer Son   . Stroke Son   . Arthritis Son   . Hypertension Brother        mom's side  . Osteoporosis Sister   . Hypertension Sister   . Breast cancer Cousin   . Heart disease Maternal Uncle        x 4  . Heart disease Maternal Aunt        x 4  . Diabetes Paternal Grandmother   . Diabetes Paternal Uncle        x 2  . Arthritis Daughter   . Colon cancer Neg Hx      ROS   ROS  Exam   There were no vitals filed for this visit. General appearance: WDWN, NAD Eyes: No scleral injection Cardiovascular: Regular rate and rhythm without murmurs, rubs, gallops. No edema or variciosities. Distal pulses normal. Pulmonary: Effort normal, non-labored breathing Musculoskeletal:     Muscle tone upper extremities: Normal    Muscle tone lower extremities: Normal    Motor exam: Upper Extremities Deltoid Bicep Tricep Grip  Right 5/5 5/5 5/5 5/5  Left 5/5 5/5 5/5 5/5   Lower Extremity IP Quad PF DF EHL  Right 5/5 5/5 5/5 5/5 5/5  Left 5/5 5/5 5/5 5/5 5/5   Neurological Mental Status:    - Patient is awake, alert, oriented to person, place, month, year, and situation    - Patient is able to give a clear and  coherent history.    - No signs of aphasia or neglect Cranial Nerves    - II: Visual Fields are full. PERRL    - III/IV/VI: EOMI without ptosis or diploplia.     - V: Facial sensation is grossly normal    - VII: Facial movement is symmetric.     - VIII: hearing is intact to voice    - X: Uvula elevates symmetrically    - XI: Shoulder shrug is symmetric.    - XII: tongue is midline without atrophy or fasciculations.  Sensory: Sensation grossly intact to LT  Results - Imaging/Labs   Results for orders placed or performed during the hospital encounter of 12/14/19 (from the past 48 hour(s))  SARS CORONAVIRUS 2 (TAT 6-24 HRS) Nasopharyngeal Nasopharyngeal Swab     Status: None   Collection Time: 12/14/19  9:56 AM   Specimen: Nasopharyngeal Swab  Result Value Ref Range   SARS Coronavirus 2 NEGATIVE NEGATIVE    Comment: (NOTE) SARS-CoV-2 target nucleic acids are NOT DETECTED.  The SARS-CoV-2 RNA is generally detectable in upper and lower respiratory specimens during the acute phase of infection. Negative results do not preclude SARS-CoV-2 infection, do not rule out co-infections with other pathogens, and should not be used as the sole basis for treatment or other patient management decisions. Negative results must be combined with clinical observations, patient history, and epidemiological information. The expected result is Negative.  Fact Sheet for Patients: SugarRoll.be  Fact Sheet for Healthcare Providers: https://www.woods-mathews.com/  This test is not yet approved or cleared by the [place] FDA and  has been authorized for detection and/or diagnosis of SARS-CoV-2 by FDA under an Emergency Use Authorization (EUA). This EUA will remain  in effect (meaning this test can be used) for the duration of the COVID-19 declaration under Se  ction 564(b)(1) of the Act, 21 U.S.C. section 360bbb-3(b)(1), unless the authorization is terminated  or revoked sooner.  Performed at [facility], [place] [address] [place], [place] [address]     No results found.  IMAGING: Diagnostic cerebral angiogram was again reviewed demonstrating a relatively wide necked approximately 10 mm basilar apex aneurysm.  There is also a smaller approximately 3.5 mm anterior communicating artery aneurysm.  Impression/Plan   79 y.o. female with relatively large basilar apex aneurysm and a much smaller anterior communicating artery aneurysm.  Given the size and location of the basilar aneurysm I do think that treatment is warranted.    We will proceed with stent supported coil embolization of the basilar apex aneurysm.  Patient has been taking 81 mg aspirin and Effient 10 mg daily in preparation for the procedure for the past 7 days as discussed with her nephrologist.  We have reviewed the indications for surgery, the associated risks, benefits and alternatives at length in the office with both the patient and her daughter.  All questions today were answered and consent was obtained.  Consuella Lose, MD [facility]

## 2019-12-16 NOTE — Progress Notes (Signed)
Spoke with pt for pre-op call. Pt denies cardiac history or Diabetes. Pt was started on Aspirin 81 mg and Effient 10 mg 10 days prior to surgery. Will take both tonight.   Covid test done 12/14/19 and it's negative. Pt states she has been in quarantine since the test was done and will continue to stay in quarantine until she comes to the hospital in the AM.

## 2019-12-16 NOTE — Anesthesia Preprocedure Evaluation (Addendum)
Anesthesia Evaluation  Patient identified by MRN, date of birth, ID band Patient awake    Reviewed: Allergy & Precautions, NPO status , Patient's Chart, lab work & pertinent test results  History of Anesthesia Complications (+) PONV and history of anesthetic complications  Airway Mallampati: II  TM Distance: >3 FB Neck ROM: Full    Dental  (+) Dental Advisory Given, Teeth Intact   Pulmonary neg pulmonary ROS,    Pulmonary exam normal breath sounds clear to auscultation       Cardiovascular hypertension, Pt. on home beta blockers and Pt. on medications + CAD  Normal cardiovascular exam Rhythm:Regular Rate:Normal     Neuro/Psych  Headaches, PSYCHIATRIC DISORDERS Anxiety Depression  Neuromuscular disease    GI/Hepatic Neg liver ROS, hiatal hernia, GERD  ,  Endo/Other  negative endocrine ROS  Renal/GU Renal disease     Musculoskeletal  (+) Arthritis , Fibromyalgia -  Abdominal (+) + obese,   Peds  Hematology  (+) Blood dyscrasia, anemia ,   Anesthesia Other Findings   Reproductive/Obstetrics                            Anesthesia Physical Anesthesia Plan  ASA: III  Anesthesia Plan: General   Post-op Pain Management:    Induction: Intravenous  PONV Risk Score and Plan: 4 or greater and Ondansetron, Dexamethasone and Treatment may vary due to age or medical condition  Airway Management Planned:   Additional Equipment: Arterial line  Intra-op Plan:   Post-operative Plan: Possible Post-op intubation/ventilation  Informed Consent: I have reviewed the patients History and Physical, chart, labs and discussed the procedure including the risks, benefits and alternatives for the proposed anesthesia with the patient or authorized representative who has indicated his/her understanding and acceptance.     Dental advisory given  Plan Discussed with: CRNA  Anesthesia Plan Comments: (PAT  note written 12/16/2019 by Myra Gianotti, PA-C. )      Anesthesia Quick Evaluation                                  Anesthesia Evaluation  Patient identified by MRN, date of birth, ID band Patient awake and Patient confused    Reviewed: Allergy & Precautions, NPO status , Patient's Chart, lab work & pertinent test results  History of Anesthesia Complications (+) PONV, AWARENESS UNDER ANESTHESIA and history of anesthetic complications  Airway Mallampati: II  TM Distance: >3 FB Neck ROM: Full    Dental no notable dental hx.    Pulmonary neg pulmonary ROS,    Pulmonary exam normal breath sounds clear to auscultation       Cardiovascular hypertension, + CAD  Normal cardiovascular exam Rhythm:Regular Rate:Normal     Neuro/Psych PSYCHIATRIC DISORDERS Anxiety Depression Schizophrenia H/O back pain, scoliosis. She has been evaluated by Dr. Roselee Culver with possible surgery next year.  Neuromuscular disease    GI/Hepatic Neg liver ROS, hiatal hernia, GERD  ,  Endo/Other  negative endocrine ROS  Renal/GU Renal disease  negative genitourinary   Musculoskeletal  (+) Arthritis , Fibromyalgia -  Abdominal (+) + obese,   Peds negative pediatric ROS (+)  Hematology  (+) anemia ,   Anesthesia Other Findings   Reproductive/Obstetrics negative OB ROS  Anesthesia Physical Anesthesia Plan  ASA: II  Anesthesia Plan: Spinal   Post-op Pain Management:    Induction: Intravenous  Airway Management Planned: Natural Airway  Additional Equipment:   Intra-op Plan: Utilization of Controlled Hypotension per surrgeon request  Post-operative Plan:   Informed Consent: I have reviewed the patients History and Physical, chart, labs and discussed the procedure including the risks, benefits and alternatives for the proposed anesthesia with the patient or authorized representative who has indicated his/her understanding  and acceptance.   Dental advisory given  Plan Discussed with: CRNA  Anesthesia Plan Comments: (Discussed risks and benefits of and differences between spinal and general. Discussed risks of spinal including headache, backache, failure, bleeding and hematoma, infection, and nerve damage. Patient consents to spinal. Questions answered. Coagulation studies and platelet count acceptable.  Longer than usual discussion of spinal versus general anesthesia in setting of scoliosis and back pain. Discussed possible increased risk of failure. She understands and would like to have a spinal.)       Anesthesia Quick Evaluation

## 2019-12-17 ENCOUNTER — Inpatient Hospital Stay (HOSPITAL_COMMUNITY): Payer: PPO | Admitting: Physician Assistant

## 2019-12-17 ENCOUNTER — Other Ambulatory Visit: Payer: Self-pay

## 2019-12-17 ENCOUNTER — Inpatient Hospital Stay (HOSPITAL_COMMUNITY)
Admission: RE | Admit: 2019-12-17 | Discharge: 2019-12-17 | Disposition: A | Discharge status: Home / Self Care | Payer: PPO | Source: Ambulatory Visit | Attending: Neurosurgery | Admitting: Neurosurgery

## 2019-12-17 ENCOUNTER — Inpatient Hospital Stay (HOSPITAL_COMMUNITY)
Admission: RE | Admit: 2019-12-17 | Discharge: 2019-12-18 | DRG: 027 | Disposition: A | Discharge status: Home / Self Care | Payer: PPO | Attending: Neurosurgery | Admitting: Neurosurgery

## 2019-12-17 ENCOUNTER — Encounter (HOSPITAL_COMMUNITY)
Admission: RE | Disposition: A | Discharge status: Home / Self Care | Payer: Self-pay | Source: Home / Self Care | Attending: Neurosurgery

## 2019-12-17 ENCOUNTER — Encounter (HOSPITAL_COMMUNITY): Payer: Self-pay | Admitting: Neurosurgery

## 2019-12-17 DIAGNOSIS — M419 Scoliosis, unspecified: Secondary | ICD-10-CM | POA: Diagnosis present

## 2019-12-17 DIAGNOSIS — Z96611 Presence of right artificial shoulder joint: Secondary | ICD-10-CM | POA: Diagnosis present

## 2019-12-17 DIAGNOSIS — Z8262 Family history of osteoporosis: Secondary | ICD-10-CM

## 2019-12-17 DIAGNOSIS — Z79899 Other long term (current) drug therapy: Secondary | ICD-10-CM

## 2019-12-17 DIAGNOSIS — Z833 Family history of diabetes mellitus: Secondary | ICD-10-CM

## 2019-12-17 DIAGNOSIS — N183 Chronic kidney disease, stage 3 unspecified: Secondary | ICD-10-CM | POA: Diagnosis present

## 2019-12-17 DIAGNOSIS — Z96653 Presence of artificial knee joint, bilateral: Secondary | ICD-10-CM | POA: Diagnosis present

## 2019-12-17 DIAGNOSIS — I671 Cerebral aneurysm, nonruptured: Secondary | ICD-10-CM

## 2019-12-17 DIAGNOSIS — K589 Irritable bowel syndrome without diarrhea: Secondary | ICD-10-CM | POA: Diagnosis present

## 2019-12-17 DIAGNOSIS — Z882 Allergy status to sulfonamides status: Secondary | ICD-10-CM | POA: Diagnosis not present

## 2019-12-17 DIAGNOSIS — I2510 Atherosclerotic heart disease of native coronary artery without angina pectoris: Secondary | ICD-10-CM | POA: Diagnosis present

## 2019-12-17 DIAGNOSIS — K219 Gastro-esophageal reflux disease without esophagitis: Secondary | ICD-10-CM | POA: Diagnosis present

## 2019-12-17 DIAGNOSIS — E785 Hyperlipidemia, unspecified: Secondary | ICD-10-CM | POA: Diagnosis present

## 2019-12-17 DIAGNOSIS — M797 Fibromyalgia: Secondary | ICD-10-CM | POA: Diagnosis present

## 2019-12-17 DIAGNOSIS — Z20822 Contact with and (suspected) exposure to covid-19: Secondary | ICD-10-CM | POA: Diagnosis present

## 2019-12-17 DIAGNOSIS — Z801 Family history of malignant neoplasm of trachea, bronchus and lung: Secondary | ICD-10-CM

## 2019-12-17 DIAGNOSIS — Z91041 Radiographic dye allergy status: Secondary | ICD-10-CM

## 2019-12-17 DIAGNOSIS — Z85828 Personal history of other malignant neoplasm of skin: Secondary | ICD-10-CM | POA: Diagnosis not present

## 2019-12-17 DIAGNOSIS — Z8249 Family history of ischemic heart disease and other diseases of the circulatory system: Secondary | ICD-10-CM

## 2019-12-17 DIAGNOSIS — Z8051 Family history of malignant neoplasm of kidney: Secondary | ICD-10-CM

## 2019-12-17 DIAGNOSIS — Z820 Family history of epilepsy and other diseases of the nervous system: Secondary | ICD-10-CM

## 2019-12-17 DIAGNOSIS — E739 Lactose intolerance, unspecified: Secondary | ICD-10-CM | POA: Diagnosis present

## 2019-12-17 DIAGNOSIS — Z888 Allergy status to other drugs, medicaments and biological substances status: Secondary | ICD-10-CM | POA: Diagnosis not present

## 2019-12-17 DIAGNOSIS — Z7902 Long term (current) use of antithrombotics/antiplatelets: Secondary | ICD-10-CM | POA: Diagnosis not present

## 2019-12-17 DIAGNOSIS — Z823 Family history of stroke: Secondary | ICD-10-CM

## 2019-12-17 DIAGNOSIS — Z803 Family history of malignant neoplasm of breast: Secondary | ICD-10-CM

## 2019-12-17 DIAGNOSIS — Z886 Allergy status to analgesic agent status: Secondary | ICD-10-CM

## 2019-12-17 DIAGNOSIS — M8580 Other specified disorders of bone density and structure, unspecified site: Secondary | ICD-10-CM | POA: Diagnosis present

## 2019-12-17 HISTORY — DX: Personal history of urinary calculi: Z87.442

## 2019-12-17 HISTORY — PX: IR NEURO EACH ADD'L AFTER BASIC UNI LEFT (MS): IMG5373

## 2019-12-17 HISTORY — DX: Dizziness and giddiness: R42

## 2019-12-17 HISTORY — DX: Headache, unspecified: R51.9

## 2019-12-17 HISTORY — DX: Anemia, unspecified: D64.9

## 2019-12-17 HISTORY — PX: IR TRANSCATH/EMBOLIZ: IMG695

## 2019-12-17 HISTORY — PX: RADIOLOGY WITH ANESTHESIA: SHX6223

## 2019-12-17 HISTORY — DX: Acute embolism and thrombosis of unspecified vein: I82.90

## 2019-12-17 HISTORY — PX: IR ANGIOGRAM FOLLOW UP STUDY: IMG697

## 2019-12-17 HISTORY — PX: IR ANGIO VERTEBRAL SEL VERTEBRAL BILAT MOD SED: IMG5369

## 2019-12-17 LAB — CBC WITH DIFFERENTIAL/PLATELET
Abs Immature Granulocytes: 0.02 10*3/uL (ref 0.00–0.07)
Basophils Absolute: 0.1 10*3/uL (ref 0.0–0.1)
Basophils Relative: 1 %
Eosinophils Absolute: 0 10*3/uL (ref 0.0–0.5)
Eosinophils Relative: 0 %
HCT: 41 % (ref 36.0–46.0)
Hemoglobin: 12.7 g/dL (ref 12.0–15.0)
Immature Granulocytes: 0 %
Lymphocytes Relative: 12 %
Lymphs Abs: 0.7 10*3/uL (ref 0.7–4.0)
MCH: 28.5 pg (ref 26.0–34.0)
MCHC: 31 g/dL (ref 30.0–36.0)
MCV: 91.9 fL (ref 80.0–100.0)
Monocytes Absolute: 0.4 10*3/uL (ref 0.1–1.0)
Monocytes Relative: 7 %
Neutro Abs: 4.5 10*3/uL (ref 1.7–7.7)
Neutrophils Relative %: 80 %
Platelets: 167 10*3/uL (ref 150–400)
RBC: 4.46 MIL/uL (ref 3.87–5.11)
RDW: 14.2 % (ref 11.5–15.5)
WBC: 5.6 10*3/uL (ref 4.0–10.5)
nRBC: 0 % (ref 0.0–0.2)

## 2019-12-17 LAB — URINALYSIS, ROUTINE W REFLEX MICROSCOPIC
Bilirubin Urine: NEGATIVE
Glucose, UA: NEGATIVE mg/dL
Ketones, ur: NEGATIVE mg/dL
Nitrite: NEGATIVE
Protein, ur: NEGATIVE mg/dL
Specific Gravity, Urine: 1.017 (ref 1.005–1.030)
pH: 5 (ref 5.0–8.0)

## 2019-12-17 LAB — BASIC METABOLIC PANEL
Anion gap: 11 (ref 5–15)
BUN: 23 mg/dL (ref 8–23)
CO2: 20 mmol/L — ABNORMAL LOW (ref 22–32)
Calcium: 9.7 mg/dL (ref 8.9–10.3)
Chloride: 107 mmol/L (ref 98–111)
Creatinine, Ser: 1.06 mg/dL — ABNORMAL HIGH (ref 0.44–1.00)
GFR calc Af Amer: 58 mL/min — ABNORMAL LOW (ref >60)
GFR calc non Af Amer: 50 mL/min — ABNORMAL LOW (ref >60)
Glucose, Bld: 104 mg/dL — ABNORMAL HIGH (ref 70–99)
Potassium: 3.8 mmol/L (ref 3.5–5.1)
Sodium: 138 mmol/L (ref 135–145)

## 2019-12-17 LAB — PROTIME-INR
INR: 1 (ref 0.8–1.2)
Prothrombin Time: 12.7 seconds (ref 11.4–15.2)

## 2019-12-17 LAB — MRSA PCR SCREENING: MRSA by PCR: NEGATIVE

## 2019-12-17 LAB — APTT: aPTT: 21 seconds — ABNORMAL LOW (ref 24–36)

## 2019-12-17 SURGERY — IR WITH ANESTHESIA
Anesthesia: General

## 2019-12-17 MED ORDER — SUGAMMADEX SODIUM 200 MG/2ML IV SOLN
INTRAVENOUS | Status: DC | PRN
  Administered 2019-12-17: 150 mg via INTRAVENOUS

## 2019-12-17 MED ORDER — DEXAMETHASONE SODIUM PHOSPHATE 10 MG/ML IJ SOLN
INTRAMUSCULAR | Status: DC | PRN
  Administered 2019-12-17: 5 mg via INTRAVENOUS

## 2019-12-17 MED ORDER — IOHEXOL 300 MG/ML  SOLN
150.0000 mL | Freq: Once | INTRAMUSCULAR | Status: AC | PRN
  Administered 2019-12-17: 64 mL via INTRA_ARTERIAL

## 2019-12-17 MED ORDER — AMLODIPINE BESYLATE 5 MG PO TABS: 5.0000 mg | ORAL_TABLET | Freq: Every day | ORAL | Status: DC

## 2019-12-17 MED ORDER — PHENYLEPHRINE HCL-NACL 10-0.9 MG/250ML-% IV SOLN
INTRAVENOUS | Status: DC | PRN
  Administered 2019-12-17: 20 ug/min via INTRAVENOUS

## 2019-12-17 MED ORDER — ORAL CARE MOUTH RINSE: 15.0000 mL | Freq: Once | OROMUCOSAL | Status: AC

## 2019-12-17 MED ORDER — FENTANYL CITRATE (PF) 250 MCG/5ML IJ SOLN
INTRAMUSCULAR | Status: AC
  Filled 2019-12-17: qty 5

## 2019-12-17 MED ORDER — SODIUM CHLORIDE 0.9 % IV SOLN: INTRAVENOUS | Status: DC

## 2019-12-17 MED ORDER — LACTATED RINGERS IV SOLN: INTRAVENOUS | Status: DC

## 2019-12-17 MED ORDER — CHLORHEXIDINE GLUCONATE CLOTH 2 % EX PADS: 6.0000 | MEDICATED_PAD | Freq: Every day | CUTANEOUS | Status: DC

## 2019-12-17 MED ORDER — ASPIRIN 81 MG PO CHEW
81.0000 mg | CHEWABLE_TABLET | Freq: Every evening | ORAL | Status: DC
  Administered 2019-12-17: 81 mg via ORAL
  Filled 2019-12-17: qty 1

## 2019-12-17 MED ORDER — GLYCOPYRROLATE PF 0.2 MG/ML IJ SOSY
PREFILLED_SYRINGE | INTRAMUSCULAR | Status: DC | PRN
  Administered 2019-12-17 (×2): .1 mg via INTRAVENOUS

## 2019-12-17 MED ORDER — ROCURONIUM BROMIDE 10 MG/ML (PF) SYRINGE
PREFILLED_SYRINGE | INTRAVENOUS | Status: DC | PRN
  Administered 2019-12-17: 20 mg via INTRAVENOUS
  Administered 2019-12-17: 50 mg via INTRAVENOUS
  Administered 2019-12-17: 20 mg via INTRAVENOUS

## 2019-12-17 MED ORDER — FENTANYL CITRATE (PF) 250 MCG/5ML IJ SOLN
INTRAMUSCULAR | Status: DC | PRN
Start: 2019-12-17 — End: 2019-12-17
  Administered 2019-12-17: 100 ug via INTRAVENOUS
  Administered 2019-12-17: 50 ug via INTRAVENOUS

## 2019-12-17 MED ORDER — CEFAZOLIN SODIUM-DEXTROSE 2-4 GM/100ML-% IV SOLN
2.0000 g | INTRAVENOUS | Status: AC
  Administered 2019-12-17: 2 g via INTRAVENOUS
  Filled 2019-12-17: qty 100

## 2019-12-17 MED ORDER — LABETALOL HCL 5 MG/ML IV SOLN
10.0000 mg | INTRAVENOUS | Status: DC | PRN
  Administered 2019-12-17 – 2019-12-18 (×3): 10 mg via INTRAVENOUS
  Filled 2019-12-17 (×2): qty 4

## 2019-12-17 MED ORDER — ONDANSETRON HCL 4 MG/2ML IJ SOLN
INTRAMUSCULAR | Status: DC | PRN
  Administered 2019-12-17: 4 mg via INTRAVENOUS

## 2019-12-17 MED ORDER — CHLORHEXIDINE GLUCONATE CLOTH 2 % EX PADS: 6.0000 | MEDICATED_PAD | Freq: Once | CUTANEOUS | Status: DC

## 2019-12-17 MED ORDER — CHLORHEXIDINE GLUCONATE 0.12 % MT SOLN
OROMUCOSAL | Status: AC
  Administered 2019-12-17: 15 mL via OROMUCOSAL
  Filled 2019-12-17: qty 15

## 2019-12-17 MED ORDER — PANTOPRAZOLE SODIUM 40 MG PO TBEC: 40.0000 mg | DELAYED_RELEASE_TABLET | Freq: Every day | ORAL | Status: DC

## 2019-12-17 MED ORDER — LIDOCAINE 2% (20 MG/ML) 5 ML SYRINGE
INTRAMUSCULAR | Status: DC | PRN
  Administered 2019-12-17: 100 mg via INTRAVENOUS

## 2019-12-17 MED ORDER — METOPROLOL TARTRATE 25 MG PO TABS
25.0000 mg | ORAL_TABLET | Freq: Two times a day (BID) | ORAL | Status: DC
  Administered 2019-12-17 – 2019-12-18 (×2): 25 mg via ORAL
  Filled 2019-12-17 (×2): qty 1

## 2019-12-17 MED ORDER — CHLORHEXIDINE GLUCONATE 0.12 % MT SOLN: 15.0000 mL | Freq: Once | OROMUCOSAL | Status: AC

## 2019-12-17 MED ORDER — PRASUGREL HCL 5 MG PO TABS
5.0000 mg | ORAL_TABLET | Freq: Every evening | ORAL | Status: DC
  Filled 2019-12-17 (×2): qty 1

## 2019-12-17 MED ORDER — LABETALOL HCL 5 MG/ML IV SOLN
INTRAVENOUS | Status: DC | PRN
  Administered 2019-12-17: 2.5 mg via INTRAVENOUS

## 2019-12-17 MED ORDER — ONDANSETRON HCL 4 MG PO TABS: 4.0000 mg | ORAL_TABLET | ORAL | Status: DC | PRN

## 2019-12-17 MED ORDER — ONDANSETRON HCL 4 MG/2ML IJ SOLN: 4.0000 mg | INTRAMUSCULAR | Status: DC | PRN

## 2019-12-17 MED ORDER — DULOXETINE HCL 60 MG PO CPEP
60.0000 mg | ORAL_CAPSULE | Freq: Every day | ORAL | Status: DC
  Administered 2019-12-17: 60 mg via ORAL
  Filled 2019-12-17: qty 1

## 2019-12-17 MED ORDER — CLEVIDIPINE BUTYRATE 0.5 MG/ML IV EMUL
INTRAVENOUS | Status: AC
  Filled 2019-12-17: qty 50

## 2019-12-17 MED ORDER — RISAQUAD PO CAPS
1.0000 | ORAL_CAPSULE | Freq: Every day | ORAL | Status: DC
  Administered 2019-12-17 – 2019-12-18 (×2): 1 via ORAL
  Filled 2019-12-17 (×2): qty 1

## 2019-12-17 MED ORDER — HEPARIN SODIUM (PORCINE) 1000 UNIT/ML IJ SOLN
INTRAMUSCULAR | Status: DC | PRN
  Administered 2019-12-17: 5000 [IU] via INTRAVENOUS
  Administered 2019-12-17: 1000 [IU] via INTRAVENOUS

## 2019-12-17 MED ORDER — PROPOFOL 10 MG/ML IV BOLUS
INTRAVENOUS | Status: DC | PRN
  Administered 2019-12-17: 30 mg via INTRAVENOUS
  Administered 2019-12-17: 110 mg via INTRAVENOUS

## 2019-12-17 MED ORDER — FENTANYL CITRATE (PF) 100 MCG/2ML IJ SOLN: 25.0000 ug | INTRAMUSCULAR | Status: DC | PRN

## 2019-12-17 MED ORDER — CLEVIDIPINE BUTYRATE 0.5 MG/ML IV EMUL: INTRAVENOUS | Status: DC | PRN

## 2019-12-17 MED ORDER — MORPHINE SULFATE (PF) 2 MG/ML IV SOLN: 1.0000 mg | INTRAVENOUS | Status: DC | PRN

## 2019-12-17 MED ORDER — ONDANSETRON HCL 4 MG/2ML IJ SOLN: 4.0000 mg | Freq: Once | INTRAMUSCULAR | Status: DC | PRN

## 2019-12-17 MED ORDER — ROSUVASTATIN CALCIUM 20 MG PO TABS
40.0000 mg | ORAL_TABLET | Freq: Every day | ORAL | Status: DC
  Administered 2019-12-17: 40 mg via ORAL
  Filled 2019-12-17: qty 2

## 2019-12-17 NOTE — Brief Op Note (Signed)
  NEUROSURGERY BRIEF OPERATIVE  NOTE   PREOP DX: Basilar aneurysm  POSTOP DX: Same  PROCEDURE: Stent-supported coil embolization of basilar aneurysm  SURGEON: Dr. Consuella Lose, MD  ANESTHESIA: GETA  EBL: Minimal  SPECIMENS: None  COMPLICATIONS: None  CONDITION: Stable to recovery  FINDINGS (Full report in [facility]): 1. Successful stent-supported coiling of basilar apex aneurysm, minimal aneurysm residual post-procedure.

## 2019-12-17 NOTE — Transfer of Care (Signed)
Immediate Anesthesia Transfer of Care Note  Patient: [name] [name]  Procedure(s) Performed: 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 (N/A )  Patient Location: PACU  Anesthesia Type:General  Level of Consciousness: drowsy and patient cooperative  Airway & Oxygen Therapy: Patient Spontanous Breathing and Patient connected to nasal cannula oxygen  Post-op Assessment: Report given to RN, Post -op Vital signs reviewed and stable, Patient moving all extremities X 4 and Patient able to stick tongue midline  Post vital signs: Reviewed and stable  Last Vitals:  Vitals Value Taken Time  BP 137/72 12/17/19 1326  Temp    Pulse 74 12/17/19 1333  Resp 17 12/17/19 1333  SpO2 97 % 12/17/19 1333  Vitals shown include unvalidated device data.  Last Pain:  Vitals:   12/17/19 1326  TempSrc:   PainSc: (P) 0-No pain         Complications: No complications documented.

## 2019-12-17 NOTE — Anesthesia Procedure Notes (Signed)
Arterial Line Insertion Start/End8/27/2021 9:45 AM  Patient location: Pre-op. Preanesthetic checklist: patient identified, IV checked, site marked, risks and benefits discussed, surgical consent, monitors and equipment checked, pre-op evaluation, timeout performed and anesthesia consent Lidocaine 1% used for infiltration Left, radial was placed Catheter size: 20 Fr Hand hygiene performed  and maximum sterile barriers used   Attempts: 1 Procedure performed without using ultrasound guided technique. Following insertion, dressing applied and Biopatch. Post procedure assessment: normal and unchanged  Patient tolerated the procedure well with no immediate complications.

## 2019-12-17 NOTE — Anesthesia Procedure Notes (Signed)
Procedure Name: Intubation Date/Time: 12/17/2019 10:33 AM Performed by: Darletta Moll, CRNA Pre-anesthesia Checklist: Patient identified, Emergency Drugs available, Suction available and Patient being monitored Patient Re-evaluated:Patient Re-evaluated prior to induction Oxygen Delivery Method: Circle system utilized Preoxygenation: Pre-oxygenation with 100% oxygen Induction Type: IV induction Ventilation: Mask ventilation without difficulty and Oral airway inserted - appropriate to patient size Laryngoscope Size: Mac and 3 Grade View: Grade II Tube type: Oral Tube size: 7.0 mm Number of attempts: 1 Airway Equipment and Method: Stylet Placement Confirmation: ETT inserted through vocal cords under direct vision,  positive ETCO2 and breath sounds checked- equal and bilateral Secured at: 21 cm Tube secured with: Tape Dental Injury: Teeth and Oropharynx as per pre-operative assessment

## 2019-12-18 MED ORDER — CALCIUM CARBONATE ANTACID 500 MG PO CHEW
1.0000 | CHEWABLE_TABLET | Freq: Four times a day (QID) | ORAL | Status: DC | PRN
  Administered 2019-12-18: 400 mg via ORAL
  Filled 2019-12-18: qty 2

## 2019-12-18 MED ORDER — PANTOPRAZOLE SODIUM 40 MG PO TBEC
40.0000 mg | DELAYED_RELEASE_TABLET | Freq: Every day | ORAL | Status: DC
  Administered 2019-12-18: 40 mg via ORAL
  Filled 2019-12-18 (×2): qty 1

## 2019-12-18 NOTE — Discharge Instructions (Signed)
Endovascular Therapy for Cerebral Aneurysm  A cerebral aneurysm is a weak spot in a blood vessel (artery) in the brain. The weak spot fills with blood and it bulges. This can put pressure on nerves or on other blood vessels. If the aneurysm bursts (ruptures), it can cause bleeding in the brain (cerebral hemorrhage). A cerebral hemorrhage is a medical emergency that can lead to serious health problems or death. Endovascular therapy is a procedure that fills the aneurysm with small, platinum coils. The coils destroy the aneurysm by preventing blood from flowing into it. This treatment is also called endovascular embolization or coiling. This may be done for an aneurysm that is or is not ruptured. In some cases, the procedure may need to be repeated. Tell a health care provider about:  Any allergies you have.  All medicines you are taking, including vitamins, herbs, eye drops, creams, and over-the-counter medicines.  Any problems you or family members have had with anesthetic medicines.  Any blood disorders you have.  Any medical conditions you have.  Any surgeries you have had.  Whether you are pregnant or may be pregnant.  Whether you smoke or use tobacco products. What are the risks? Generally, this is a safe procedure. However, problems may occur, including:  Infection.  Bleeding.  Stroke.  Brain injury, which may result in vision or speech difficulty, memory loss, or behavior and thinking problems.  Damage to other structures or organs.  Blood clots.  Allergic reaction to medicines or dyes. What happens before the procedure? Staying hydrated Follow instructions from your health care provider about hydration, which may include:  Up to 2 hours before the procedure - you may continue to drink clear liquids, such as water, clear fruit juice, black coffee, and plain tea. Eating and drinking restrictions Follow instructions from your health care provider about eating or  drinking, which may include:  8 hours before the procedure - stop eating heavy meals or foods such as meat, fried foods, or fatty foods.  6 hours before the procedure - stop eating light meals or foods, such as toast or cereal.  6 hours before the procedure - stop drinking milk or drinks that contain milk.  2 hours before the procedure - stop drinking clear liquids. Medicines  Ask your health care provider about: ? Changing or stopping your regular medicines. This is especially important if you are taking diabetes medicines or blood thinners. ? Taking over-the-counter medicines, vitamins, herbs, and supplements. ? Taking medicines such as aspirin and ibuprofen. These medicines can thin your blood. Do not take these medicines unless your health care provider tells you to take them.  You may be given antibiotic medicine to help prevent infection. General instructions  You may be asked to shower with a germ-killing soap.  You may have a blood or urine sample taken.  Do not use any products that contain nicotine or tobacco, such as cigarettes and e-cigarettes. If you need help quitting, ask your health care provider.  You may have a neurological exam or testing to check brain and nerve function.  You may have imaging tests done, such as CT scan or MRI. What happens during the procedure?  To lower your risk of infection: ? Your health care team will wash or sanitize their hands. ? Hair may be removed from the surgical area ? The skin in your groin area will be washed with soap.  An IV will be inserted into one of your veins.  You will be given  one or more of the following: ? A medicine to help you relax (sedative). ? A medicine to numb the area (local anesthetic). ? A medicine to make you fall asleep (general anesthetic).  A small incision will be made near your groin, above one of the main arteries that can be used to reach the brain (femoral artery). In rare cases, an incision  will be made in your arm instead.  A small, thin tube (catheter) will be inserted into the femoral artery.  Dye will be injected into the catheter, and then X-rays will be taken (angiogram). The dye helps to show the location of the catheter and the aneurysm on the X-rays.  The catheter will be moved up to the aneurysm, using X-ray images as a guide.  A small wire (guide wire) will be used to pass small platinum coils through the catheter to the aneurysm.  After the coils are placed inside the aneurysm, a small electric current will be sent through the guide wire. This will cause the coils to separate (detach) from the wire. The coils will stay inside the aneurysm, blocking blood flow to the aneurysm.  In some cases, a small balloon (balloon catheter) or a flexible cage (stent) may be inserted into the artery. This will help to keep the coils in place if the opening to the aneurysm is large.  The guide wire and catheter will be removed.  Your incision will be closed with a plug, clip, or stitches (sutures), then covered with a bandage (dressing). The procedure may vary among health care providers and hospitals. What happens after the procedure?  You will be moved to an intensive care unit (ICU). Your blood pressure, heart rate, breathing rate, and blood oxygen level will be monitored until the medicines you were given have worn off.  Your activity will be restricted.  You may have to wear compression stockings. These stockings help to prevent blood clots and reduce swelling in your legs.  Do not drive until your health care provider approves. Summary  A cerebral aneurysm is a weak spot in a blood vessel in the brain. When the aneurysm fills with blood, it can put pressure on nerves or on other blood vessels.  If a cerebral aneurysm is not treated, it can burst and cause bleeding in the brain (cerebral hemorrhage).  Endovascular therapy is a procedure that fills an aneurysm with  small, platinum coils. The coils prevent blood from filling the aneurysm.  In some cases, this treatment may need to be repeated.  After this procedure, you will be monitored in the ICU. You will have activity restrictions while your body heals. This information is not intended to replace advice given to you by your health care provider. Make sure you discuss any questions you have with your health care provider. Document Revised: 04/11/2017 Document Reviewed: 07/22/2016 Elsevier Patient Education  [place].   Endovascular Therapy for Cerebral Aneurysm  A cerebral aneurysm is a weak spot in a blood vessel (artery) in the brain. The weak spot fills with blood and it bulges. This can put pressure on nerves or on other blood vessels. If the aneurysm bursts (ruptures), it can cause bleeding in the brain (cerebral hemorrhage). A cerebral hemorrhage is a medical emergency that can lead to serious health problems or death. Endovascular therapy is a procedure that fills the aneurysm with small, platinum coils. The coils destroy the aneurysm by preventing blood from flowing into it. This treatment is also called endovascular embolization  or coiling. This may be done for an aneurysm that is or is not ruptured. In some cases, the procedure may need to be repeated. Tell a health care provider about:  Any allergies you have.  All medicines you are taking, including vitamins, herbs, eye drops, creams, and over-the-counter medicines.  Any problems you or family members have had with anesthetic medicines.  Any blood disorders you have.  Any medical conditions you have.  Any surgeries you have had.  Whether you are pregnant or may be pregnant.  Whether you smoke or use tobacco products. What are the risks? Generally, this is a safe procedure. However, problems may occur, including:  Infection.  Bleeding.  Stroke.  Brain injury, which may result in vision or speech difficulty, memory  loss, or behavior and thinking problems.  Damage to other structures or organs.  Blood clots.  Allergic reaction to medicines or dyes. What happens before the procedure? Staying hydrated Follow instructions from your health care provider about hydration, which may include:  Up to 2 hours before the procedure - you may continue to drink clear liquids, such as water, clear fruit juice, black coffee, and plain tea. Eating and drinking restrictions Follow instructions from your health care provider about eating or drinking, which may include:  8 hours before the procedure - stop eating heavy meals or foods such as meat, fried foods, or fatty foods.  6 hours before the procedure - stop eating light meals or foods, such as toast or cereal.  6 hours before the procedure - stop drinking milk or drinks that contain milk.  2 hours before the procedure - stop drinking clear liquids. Medicines  Ask your health care provider about: ? Changing or stopping your regular medicines. This is especially important if you are taking diabetes medicines or blood thinners. ? Taking over-the-counter medicines, vitamins, herbs, and supplements. ? Taking medicines such as aspirin and ibuprofen. These medicines can thin your blood. Do not take these medicines unless your health care provider tells you to take them.  You may be given antibiotic medicine to help prevent infection. General instructions  You may be asked to shower with a germ-killing soap.  You may have a blood or urine sample taken.  Do not use any products that contain nicotine or tobacco, such as cigarettes and e-cigarettes. If you need help quitting, ask your health care provider.  You may have a neurological exam or testing to check brain and nerve function.  You may have imaging tests done, such as CT scan or MRI. What happens during the procedure?  To lower your risk of infection: ? Your health care team will wash or sanitize their  hands. ? Hair may be removed from the surgical area ? The skin in your groin area will be washed with soap.  An IV will be inserted into one of your veins.  You will be given one or more of the following: ? A medicine to help you relax (sedative). ? A medicine to numb the area (local anesthetic). ? A medicine to make you fall asleep (general anesthetic).  A small incision will be made near your groin, above one of the main arteries that can be used to reach the brain (femoral artery). In rare cases, an incision will be made in your arm instead.  A small, thin tube (catheter) will be inserted into the femoral artery.  Dye will be injected into the catheter, and then X-rays will be taken (angiogram). The dye helps  to show the location of the catheter and the aneurysm on the X-rays.  The catheter will be moved up to the aneurysm, using X-ray images as a guide.  A small wire (guide wire) will be used to pass small platinum coils through the catheter to the aneurysm.  After the coils are placed inside the aneurysm, a small electric current will be sent through the guide wire. This will cause the coils to separate (detach) from the wire. The coils will stay inside the aneurysm, blocking blood flow to the aneurysm.  In some cases, a small balloon (balloon catheter) or a flexible cage (stent) may be inserted into the artery. This will help to keep the coils in place if the opening to the aneurysm is large.  The guide wire and catheter will be removed.  Your incision will be closed with a plug, clip, or stitches (sutures), then covered with a bandage (dressing). The procedure may vary among health care providers and hospitals. What happens after the procedure?  You will be moved to an intensive care unit (ICU). Your blood pressure, heart rate, breathing rate, and blood oxygen level will be monitored until the medicines you were given have worn off.  Your activity will be restricted.  You  may have to wear compression stockings. These stockings help to prevent blood clots and reduce swelling in your legs.  Do not drive until your health care provider approves. Summary  A cerebral aneurysm is a weak spot in a blood vessel in the brain. When the aneurysm fills with blood, it can put pressure on nerves or on other blood vessels.  If a cerebral aneurysm is not treated, it can burst and cause bleeding in the brain (cerebral hemorrhage).  Endovascular therapy is a procedure that fills an aneurysm with small, platinum coils. The coils prevent blood from filling the aneurysm.  In some cases, this treatment may need to be repeated.  After this procedure, you will be monitored in the ICU. You will have activity restrictions while your body heals. This information is not intended to replace advice given to you by your health care provider. Make sure you discuss any questions you have with your health care provider. Document Revised: 04/11/2017 Document Reviewed: 07/22/2016 Elsevier Patient Education  [place].   Endovascular Therapy for Cerebral Aneurysm  A cerebral aneurysm is a weak spot in a blood vessel (artery) in the brain. The weak spot fills with blood and it bulges. This can put pressure on nerves or on other blood vessels. If the aneurysm bursts (ruptures), it can cause bleeding in the brain (cerebral hemorrhage). A cerebral hemorrhage is a medical emergency that can lead to serious health problems or death. Endovascular therapy is a procedure that fills the aneurysm with small, platinum coils. The coils destroy the aneurysm by preventing blood from flowing into it. This treatment is also called endovascular embolization or coiling. This may be done for an aneurysm that is or is not ruptured. In some cases, the procedure may need to be repeated. Tell a health care provider about:  Any allergies you have.  All medicines you are taking, including vitamins, herbs, eye  drops, creams, and over-the-counter medicines.  Any problems you or family members have had with anesthetic medicines.  Any blood disorders you have.  Any medical conditions you have.  Any surgeries you have had.  Whether you are pregnant or may be pregnant.  Whether you smoke or use tobacco products. What are the risks?  Generally, this is a safe procedure. However, problems may occur, including:  Infection.  Bleeding.  Stroke.  Brain injury, which may result in vision or speech difficulty, memory loss, or behavior and thinking problems.  Damage to other structures or organs.  Blood clots.  Allergic reaction to medicines or dyes. What happens before the procedure? Staying hydrated Follow instructions from your health care provider about hydration, which may include:  Up to 2 hours before the procedure - you may continue to drink clear liquids, such as water, clear fruit juice, black coffee, and plain tea. Eating and drinking restrictions Follow instructions from your health care provider about eating or drinking, which may include:  8 hours before the procedure - stop eating heavy meals or foods such as meat, fried foods, or fatty foods.  6 hours before the procedure - stop eating light meals or foods, such as toast or cereal.  6 hours before the procedure - stop drinking milk or drinks that contain milk.  2 hours before the procedure - stop drinking clear liquids. Medicines  Ask your health care provider about: ? Changing or stopping your regular medicines. This is especially important if you are taking diabetes medicines or blood thinners. ? Taking over-the-counter medicines, vitamins, herbs, and supplements. ? Taking medicines such as aspirin and ibuprofen. These medicines can thin your blood. Do not take these medicines unless your health care provider tells you to take them.  You may be given antibiotic medicine to help prevent infection. General  instructions  You may be asked to shower with a germ-killing soap.  You may have a blood or urine sample taken.  Do not use any products that contain nicotine or tobacco, such as cigarettes and e-cigarettes. If you need help quitting, ask your health care provider.  You may have a neurological exam or testing to check brain and nerve function.  You may have imaging tests done, such as CT scan or MRI. What happens during the procedure?  To lower your risk of infection: ? Your health care team will wash or sanitize their hands. ? Hair may be removed from the surgical area ? The skin in your groin area will be washed with soap.  An IV will be inserted into one of your veins.  You will be given one or more of the following: ? A medicine to help you relax (sedative). ? A medicine to numb the area (local anesthetic). ? A medicine to make you fall asleep (general anesthetic).  A small incision will be made near your groin, above one of the main arteries that can be used to reach the brain (femoral artery). In rare cases, an incision will be made in your arm instead.  A small, thin tube (catheter) will be inserted into the femoral artery.  Dye will be injected into the catheter, and then X-rays will be taken (angiogram). The dye helps to show the location of the catheter and the aneurysm on the X-rays.  The catheter will be moved up to the aneurysm, using X-ray images as a guide.  A small wire (guide wire) will be used to pass small platinum coils through the catheter to the aneurysm.  After the coils are placed inside the aneurysm, a small electric current will be sent through the guide wire. This will cause the coils to separate (detach) from the wire. The coils will stay inside the aneurysm, blocking blood flow to the aneurysm.  In some cases, a small balloon (balloon catheter) or  a flexible cage (stent) may be inserted into the artery. This will help to keep the coils in place if the  opening to the aneurysm is large.  The guide wire and catheter will be removed.  Your incision will be closed with a plug, clip, or stitches (sutures), then covered with a bandage (dressing). The procedure may vary among health care providers and hospitals. What happens after the procedure?  You will be moved to an intensive care unit (ICU). Your blood pressure, heart rate, breathing rate, and blood oxygen level will be monitored until the medicines you were given have worn off.  Your activity will be restricted.  You may have to wear compression stockings. These stockings help to prevent blood clots and reduce swelling in your legs.  Do not drive until your health care provider approves. Summary  A cerebral aneurysm is a weak spot in a blood vessel in the brain. When the aneurysm fills with blood, it can put pressure on nerves or on other blood vessels.  If a cerebral aneurysm is not treated, it can burst and cause bleeding in the brain (cerebral hemorrhage).  Endovascular therapy is a procedure that fills an aneurysm with small, platinum coils. The coils prevent blood from filling the aneurysm.  In some cases, this treatment may need to be repeated.  After this procedure, you will be monitored in the ICU. You will have activity restrictions while your body heals. This information is not intended to replace advice given to you by your health care provider. Make sure you discuss any questions you have with your health care provider. Document Revised: 04/11/2017 Document Reviewed: 07/22/2016 Elsevier Patient Education  [place].

## 2019-12-18 NOTE — Progress Notes (Signed)
Patient's daughter here to take patient home, all LDA removed and patient belongings with patient. Will wheel patient downstairs.   Candy Sledge, RN

## 2019-12-18 NOTE — Discharge Summary (Signed)
Physician Discharge Summary  Patient ID: [name] [name] MRN: [id number] DOB/AGE: [birth year] 79 y.o.  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  Postoperatively doing very well.  No headache.  No new neurologic symptoms.  Mobilize without difficulty.  With discharge home  Consults:   Significant Diagnostic Studies:   Treatments:   Discharge Exam: Blood pressure 137/77, pulse 65, temperature 98.9 F (37.2 C), temperature source Oral, resp. rate 15, height 4' 11.5" (1.511 m), weight 74.1 kg, SpO2 96 %. Awake and alert.  Oriented and appropriate.  Motor and sensory function intact.  Wound clean and dry.  Chest and abdomen benign.  Disposition: Discharge disposition: 01-Home or Self Care        Allergies as of 12/18/2019      Reactions   Gadolinium Derivatives Hives   PER DR DERRY PT NEEDS 13HOUR PREP BEFORE FUTURE CONTRASTED MRI'S//[facility]   Aspirin Other (See Comments)   Acute renal failure    Lactose Intolerance (gi) Other (See Comments)   Gi upset   Meloxicam    REACTION: acute renal failure  with hypovolemic trigger Hospital  3 2011 Pt states allergic to all NSAIDS    Sulfamethoxazole Hives   Tylenol [acetaminophen] Other (See Comments)   Acute renal failure       Medication List    TAKE these medications   ALIGN PO Take 1 tablet by mouth daily.   amLODipine 5 MG tablet Commonly known as: NORVASC Take 1 tablet (5 mg total) by mouth daily. What changed: when to take this   Bayer Low Dose 81 MG chewable tablet Generic drug: aspirin Chew 81 mg by mouth every evening.   DULoxetine 60 MG capsule Commonly known as: CYMBALTA Take 1 capsule (60 mg total) by mouth 2 (two) times daily. What changed: when to  take this   Effient 10 MG Tabs tablet Generic drug: prasugrel Take 10 mg by mouth every evening.   gabapentin 100 MG capsule Commonly known as: Neurontin Take 1 capsule (100 mg total) by mouth at bedtime.   metoprolol tartrate 25 MG tablet Commonly known as: LOPRESSOR Take 1 tablet (25 mg total) by mouth 2 (two) times daily.   omeprazole 20 MG capsule Commonly known as: PRILOSEC Take 1 capsule (20 mg total) by mouth 2 (two) times daily.   rosuvastatin 40 MG tablet Commonly known as: CRESTOR Take 1 tablet (40 mg total) by mouth daily. What changed: when to take this        Signed: Charlie Pitter 12/18/2019, 11:08 AM

## 2019-12-18 NOTE — Progress Notes (Signed)
AVS reviewed with patient and all personal belongings returned to patient. Patient with one PIV remaining while waiting on daughter to arrive and give her a ride home. Patient questions answered and patient is satisfied at this time.  Candy Sledge, RN

## 2019-12-20 ENCOUNTER — Encounter (HOSPITAL_COMMUNITY): Payer: Self-pay | Admitting: Neurosurgery

## 2019-12-20 NOTE — Anesthesia Postprocedure Evaluation (Signed)
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  Last Vitals:  Vitals:   12/18/19 1200 12/18/19 1300  BP: 112/88 112/88  Pulse: 75 68  Resp: (!) 24 17  Temp: 37.2 C   SpO2: 94% 98%    Last Pain:  Vitals:   12/18/19 1200  TempSrc: Oral  PainSc: 0-No pain                 Nolon Nations

## 2020-02-05 ENCOUNTER — Ambulatory Visit: Payer: PPO | Attending: Internal Medicine

## 2020-02-05 DIAGNOSIS — Z23 Encounter for immunization: Secondary | ICD-10-CM

## 2020-02-05 NOTE — Progress Notes (Signed)
   Covid-19 Vaccination Clinic  Name:  [name] [name]    MRN: [id number] DOB: 19-Jul-1940  02/05/2020  [name] [name] was observed post Covid-19 immunization for 15 minutes without incident. She was provided with Vaccine Information Sheet and instruction to access the V-Safe system.   [name] [name] was instructed to call 911 with any severe reactions post vaccine: Marland Kitchen Difficulty breathing  . Swelling of face and throat  . A fast heartbeat  . A bad rash all over body  . Dizziness and weakness

## 2020-02-15 ENCOUNTER — Telehealth: Payer: Self-pay | Admitting: Internal Medicine

## 2020-02-15 NOTE — Telephone Encounter (Signed)
Left message for patient to call back and schedule Medicare Annual Wellness Visit (AWV) either virtually or in office.  Last AWV 02/20/16; please schedule at anytime with [facility] Nurse Health Advisor 2.  This should be a 45 minute visit.

## 2020-03-01 ENCOUNTER — Other Ambulatory Visit: Payer: Self-pay | Admitting: Internal Medicine

## 2020-03-02 ENCOUNTER — Ambulatory Visit: Payer: PPO | Admitting: Neurology

## 2020-03-02 NOTE — Telephone Encounter (Signed)
Last OV 09/02/19 Last refill 09/02/19 #180/1 Next OV not scheduled

## 2020-05-31 ENCOUNTER — Telehealth: Payer: Self-pay | Admitting: Internal Medicine

## 2020-05-31 NOTE — Telephone Encounter (Signed)
Left message for patient to call back and schedule Medicare Annual Wellness Visit (AWV) either virtually or in office.   Last AWV 02/20/16  please schedule at anytime with [facility] Nurse Health Advisor 1 or 2   This should be a 45 minute visit.

## 2020-06-09 NOTE — Progress Notes (Signed)
NEUROLOGY FOLLOW UP OFFICE NOTE  [name] [id number]   Subjective:  [name] [name] is a 80 year old right-handed female with CKD stage 3a, HTN, HLD, CAD, GERD, osteoarthritis of right knee and history of basal cell carcinoma who follows up for headache.  She is accompanied by her daughter who supplements history.  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  She had a cerebral angiogram which confirmed the basilar tip aneurysm as well as a smaller 3 mm acom aneurysm.  She underwent stent and coiling in August.  Since then, she will have a brief 15 minute dull right sided headache about twice a week.    A week ago - asleep on couch and suddenly felt electrical shock through head temple to temple.  Lasted a second.  No recurrence.  Current NSAIDS:  None.  NSAIDs previously caused kidney failure in 2011. Current analgesics:  none Current muscle relaxants:  Baclofen 10mg  TID PRN Current Antihypertensive medications:  Amlodipine, Lopressor Current Antidepressant medications:  Cymbalta 60mg  twice daily Current Antiepileptic medications:  Gabapentin 100mg  at bedtime (only as needed and only to help with sleeping) Current Vitamins/Herbal/Supplements:  B12  HISTORY: She denies history of headaches (only once in a while).  She had two prior episodes of vertigo many years ago.    December 2019 started having episodes of vertigo, described as spinning sensation.  Occurs daily and always when laying down in bed.  Lasts 45 seconds.  No nausea, double vision, tinnitus, hearing loss, numbness or weakness.  .    Around the same time that the vertigo started, she developed a sore lump on her head that lasted about a week.  She did not hit her head.  After it resolved, she started experiencing moderate pressure-like headaches, typically right sided but may be left sided  as well.  No nausea, photophobia, phonophobia, osmophobia.  They would last 1 to 2 hours and may occur 3 to 4 times a day.  No specific triggers.  She has known arthritis in the neck and applying cool compress on back of neck helps relieve headaches.  She does not take any analgesics.  MRI of cervical spine from 02/17/2013 personally reviewed demonstrated multilevel cervical spondylosis with facet disease, most notable at C5-6 and C6-7, causing moderate canal stenosis   09/01/2019 LABS:  sed rate 18.    PAST MEDICAL HISTORY: Past Medical History:  Diagnosis Date  . Acute kidney failure ([place])    5 years ago  . ACUTE KIDNEY FAILURE UNSPECIFIED 07/12/2009  . Allergy   . Anemia   . Anxiety   . Arthritis   . Basal cell carcinoma   . Blood clot in vein    states it was not deep vein  . CAD (coronary artery disease)    Mild nonobstructive by CT  . Cataract   . Cerebral aneurysm without rupture   . Chronic kidney disease    stones  . Depression    around the time of her husband's death  . Diverticulosis of colon (without mention of hemorrhage)   . Fibromyalgia   . GERD (gastroesophageal reflux disease)   . Headache   . Hiatal hernia   . History of kidney stones   . Hyperlipidemia   . Hypertension   . Interstitial cystitis   . Kidney stone   . Osteopenia   . PONV (postoperative  nausea and vomiting)   . Scoliosis   . 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 amLODipine (NORVASC) 5 MG tablet Take 1 tablet (5 mg total) by mouth daily. (Patient taking differently: Take 5 mg by mouth at bedtime. ) 90 tablet 3  . BAYER LOW DOSE 81 MG chewable tablet Chew 81 mg by mouth every evening.     . DULoxetine (CYMBALTA) 60 MG capsule Take 1 capsule (60 mg total) by mouth 2 (two) times daily. (Patient taking differently: Take  60 mg by mouth at bedtime. ) 180 capsule 1  . gabapentin (NEURONTIN) 100 MG capsule Take 1 capsule (100 mg total) by mouth at bedtime. (Patient not taking: Reported on 12/16/2019) 30 capsule 3  . metoprolol tartrate (LOPRESSOR) 25 MG tablet Take 1 tablet (25 mg total) by mouth 2 (two) times daily. 180 tablet 3  . omeprazole (PRILOSEC) 20 MG capsule TAKE 1 CAPSULE BY MOUTH TWICE A DAY 180 capsule 0  . prasugrel (EFFIENT) 10 MG TABS tablet Take 10 mg by mouth every evening.     . Probiotic Product (ALIGN PO) Take 1 tablet by mouth daily.     . rosuvastatin (CRESTOR) 40 MG tablet Take 1 tablet (40 mg total) by mouth daily. (Patient taking differently: Take 40 mg by mouth at bedtime. ) 90 tablet 3   No current facility-administered medications on file prior to visit.    ALLERGIES: Allergies  Allergen Reactions  . Gadolinium Derivatives Hives    PER DR DERRY PT NEEDS 13HOUR PREP BEFORE FUTURE CONTRASTED MRI'S//KMS  . Aspirin Other (See Comments)    Acute renal failure   . Lactose Intolerance (Gi) Other (See Comments)    Gi upset  . Meloxicam     REACTION: acute renal failure  with hypovolemic trigger Hospital  3 2011 Pt states allergic to all NSAIDS   . Sulfamethoxazole Hives  . Tylenol [Acetaminophen] Other (See Comments)    Acute renal failure     FAMILY HISTORY: Family History  Problem Relation Age of Onset  . Alzheimer's disease Mother   . Lung cancer Father        tobacco  . Kidney cancer Son   . Stroke Son   . Arthritis Son   . Hypertension Brother        mom's side  . Osteoporosis Sister   . Hypertension Sister   . Breast cancer Cousin   . Heart disease Maternal Uncle        x 4  . Heart disease Maternal Aunt        x 4  . Diabetes Paternal Grandmother   . Diabetes Paternal Uncle        x 2  . Arthritis Daughter   . Colon cancer Neg Hx     SOCIAL HISTORY: Social History   Socioeconomic History  . Marital status: Widowed    Spouse name: Not on file  . Number  of children: 2  . Years of education: Not on file  . Highest education level: Not on file  Occupational History  . Occupation: Retired   Tobacco Use  . Smoking status: Never Smoker  . Smokeless tobacco: Never Used  Vaping Use  . Vaping Use: Never used  Substance and Sexual Activity  . Alcohol use: Not Currently  Alcohol/week: 0.0 standard drinks    Comment: rare  . Drug use: No  . Sexual activity: Not on file  Other Topics Concern  . 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   Right handed   Social Determinants of Health   Financial Resource Strain: Not on file  Food Insecurity: Not on file  Transportation Needs: Not on file  Physical Activity: Not on file  Stress: Not on file  Social Connections: Not on file  Intimate Partner Violence: Not on file     Objective:  Blood pressure 115/71, pulse 78, height 4\' 11"  (1.499 m), weight 153 lb 12.8 oz (69.8 kg), SpO2 96 %. General: No acute distress.  Patient appears well-groomed.   Head:  Normocephalic/atraumatic Eyes:  Fundi examined but not visualized Neck: supple, no paraspinal tenderness, full range of motion Heart:  Regular rate and rhythm Lungs:  Clear to auscultation bilaterally Back: No paraspinal tenderness Neurological Exam: alert and oriented to person, place, and time. Attention span and concentration intact, recent and remote memory intact, fund of knowledge intact.  Speech fluent and not dysarthric, language intact.  CN II-XII intact. Bulk and tone normal, muscle strength 5/5 throughout.  Sensation to light touch  vibration intact.  Deep tendon reflexes 2+ throughout.  Finger to nose testing intact.  Gait normal, Romberg negative.   Assessment/Plan:   1.  Cerebral aneurysm s/p stent/coiling 2.  Small acom aneurysm 3.  Headache  At this point she may discontinue gabapentin as she hasn't been taking it regularly.  Headaches are  manageable.  Follow up with Dr. Kathyrn Sheriff regarding continued aneurysm surveillance.  Follow up with me as needed.  Metta Clines, DO  CC: Shanon Ace, MD

## 2020-06-12 ENCOUNTER — Encounter: Payer: Self-pay | Admitting: Neurology

## 2020-06-12 ENCOUNTER — Other Ambulatory Visit: Payer: Self-pay

## 2020-06-12 ENCOUNTER — Ambulatory Visit: Payer: PPO | Admitting: Neurology

## 2020-06-12 VITALS — BP 115/71 | HR 78 | Ht 59.0 in | Wt 153.8 lb

## 2020-06-12 DIAGNOSIS — I671 Cerebral aneurysm, nonruptured: Secondary | ICD-10-CM

## 2020-06-12 DIAGNOSIS — R519 Headache, unspecified: Secondary | ICD-10-CM | POA: Diagnosis not present

## 2020-06-12 NOTE — Patient Instructions (Signed)
Follow up with Nundkumar Follow up with me as needed

## 2020-06-16 NOTE — Progress Notes (Signed)
Subjective:   [name] [name] is a 80 y.o. female who presents for Medicare Annual (Subsequent) preventive examination.  I connected with Kirby Funk today by telephone and verified that I am speaking with the correct person using two identifiers. Location patient: home Location provider: work Persons participating in the virtual visit: patient, provider.   I discussed the limitations, risks, security and privacy concerns of performing an evaluation and management service by telephone and the availability of in person appointments. I also discussed with the patient that there may be a patient responsible charge related to this service. The patient expressed understanding and verbally consented to this telephonic visit.    Interactive audio and video telecommunications were attempted between this provider and patient, however failed, due to patient having technical difficulties OR patient did not have access to video capability.  We continued and completed visit with audio only.      Review of Systems    N/A  Cardiac Risk Factors include: advanced age (>41men, >46 women)     Objective:    Today's Vitals   There is no height or weight on file to calculate BMI.  Advanced Directives 06/19/2020 12/17/2019 11/02/2019 10/06/2019 11/17/2015 11/10/2015  Does Patient Have a Medical Advance Directive? No No No No No No  Would patient like information on creating a medical advance directive? No - Patient declined No - Patient declined No - Patient declined - No - patient declined information No - patient declined information    Current Medications (verified) Outpatient Encounter Medications as of 06/19/2020  Medication Sig  . amLODipine (NORVASC) 5 MG tablet Take 1 tablet (5 mg total) by mouth daily. (Patient taking differently: Take 5 mg by mouth at bedtime.)  . BAYER LOW DOSE 81 MG chewable tablet Chew 81 mg by mouth every evening.   . DULoxetine (CYMBALTA) 60 MG capsule Take 1 capsule (60 mg  total) by mouth 2 (two) times daily. (Patient taking differently: Take 60 mg by mouth at bedtime.)  . Melatonin 5 MG TBDP Take by mouth.  . metoprolol tartrate (LOPRESSOR) 25 MG tablet Take 1 tablet (25 mg total) by mouth 2 (two) times daily.  Marland Kitchen omeprazole (PRILOSEC) 20 MG capsule TAKE 1 CAPSULE BY MOUTH TWICE A DAY  . prasugrel (EFFIENT) 10 MG TABS tablet Take 10 mg by mouth every evening.   . Probiotic Product (ALIGN PO) Take 1 tablet by mouth daily.   . rosuvastatin (CRESTOR) 40 MG tablet Take 1 tablet (40 mg total) by mouth daily. (Patient taking differently: Take 40 mg by mouth at bedtime.)  . gabapentin (NEURONTIN) 100 MG capsule Take 1 capsule (100 mg total) by mouth at bedtime. (Patient not taking: Reported on 06/19/2020)   No facility-administered encounter medications on file as of 06/19/2020.    Allergies (verified) Gadolinium derivatives, Aspirin, Lactose intolerance (gi), Meloxicam, Metoprolol, Sulfamethoxazole, and Tylenol [acetaminophen]   History: Past Medical History:  Diagnosis Date  . Acute kidney failure ([place])    5 years ago  . ACUTE KIDNEY FAILURE UNSPECIFIED 07/12/2009  . Allergy   . Anemia   . Anxiety   . Arthritis   . Basal cell carcinoma   . Blood clot in vein    states it was not deep vein  . CAD (coronary artery disease)    Mild nonobstructive by CT  . Cataract   . Cerebral aneurysm without rupture   . Chronic kidney disease    stones  . Depression    around the time  of her husband's death  . Diverticulosis of colon (without mention of hemorrhage)   . Fibromyalgia   . GERD (gastroesophageal reflux disease)   . Headache   . Hiatal hernia   . History of kidney stones   . Hyperlipidemia   . Hypertension   . Interstitial cystitis   . Kidney stone   . Osteopenia   . PONV (postoperative nausea and vomiting)   . Scoliosis   . 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 BLADDER TUMOR EXCISION  2001  . CATARACT EXTRACTION     bilateral  . CHOLECYSTECTOMY    . CYSTECTOMY  2001   benign  . ESOPHAGEAL MANOMETRY N/A 07/13/2012   Procedure: ESOPHAGEAL MANOMETRY (EM);  Surgeon: Sable Feil, MD;  Location: [facility];  Service: Endoscopy;  Laterality: N/A;  . HIATAL HERNIA REPAIR    . IR ANGIO INTRA EXTRACRAN SEL INTERNAL CAROTID BILAT MOD SED  11/02/2019  . IR ANGIO VERTEBRAL SEL VERTEBRAL BILAT MOD SED  12/17/2019  . IR ANGIO VERTEBRAL SEL VERTEBRAL UNI R MOD SED  11/02/2019  . IR ANGIOGRAM FOLLOW UP STUDY  12/17/2019  . IR ANGIOGRAM FOLLOW UP STUDY  12/17/2019  . IR ANGIOGRAM FOLLOW UP STUDY  12/17/2019  . IR ANGIOGRAM FOLLOW UP STUDY  12/17/2019  . IR NEURO EACH ADD'L AFTER BASIC UNI LEFT (MS)  12/17/2019  . IR TRANSCATH/EMBOLIZ  12/17/2019  . LUMBAR DISC SURGERY     x 2 elsner  . RADIOLOGY WITH ANESTHESIA N/A 12/17/2019   Procedure: 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;  Surgeon: Consuella Lose, MD;  Location: [place];  Service: Radiology;  Laterality: N/A;  . REVERSE SHOULDER ARTHROPLASTY Right 08/12/2014   Procedure: RIGHT REVERSE TOTAL SHOULDER ARTHROPLASTY;  Surgeon: Netta Cedars, MD;  Location: [place];  Service: Orthopedics;  Laterality: Right;  . TENDON REPAIR Right 11/2005   right shoulder  . TOTAL KNEE ARTHROPLASTY Left    left  . TOTAL KNEE ARTHROPLASTY Right 11/17/2015   Procedure: RIGHT TOTAL KNEE ARTHROPLASTY;  Surgeon: Sydnee Cabal, MD;  [facility]: [facility];  Service: Orthopedics;  Laterality: Right;   Family History  Problem Relation Age of Onset  . Alzheimer's disease Mother   . Lung cancer Father        tobacco  . Kidney cancer Son   . Stroke Son   . Arthritis Son   . Hypertension Brother        mom's side  . Osteoporosis Sister   . Hypertension Sister   . Breast cancer Cousin   . Heart disease Maternal Uncle        x 4  . Heart disease Maternal Aunt        x 4   . Diabetes Paternal Grandmother   . Diabetes Paternal Uncle        x 2  . Arthritis Daughter   . Colon cancer Neg Hx    Social History   Socioeconomic History  . Marital status: Widowed    Spouse name: Not on file  . Number of children: 2  . Years of education: Not on file  . Highest education level: Not on file  Occupational History  . Occupation: Retired   Tobacco Use  . Smoking status: Never Smoker  . Smokeless tobacco: Never Used  Vaping Use  . Vaping Use: Never used  Substance and Sexual Activity  . Alcohol use: Not Currently    Alcohol/week: 0.0 standard drinks    Comment: rare  . Drug use: No  . Sexual activity: Not on file  Other Topics Concern  . 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   Right handed   Social Determinants of Health   Financial Resource Strain: Low Risk   . Difficulty of Paying Living Expenses: Not hard at all  Food Insecurity: No Food Insecurity  . Worried About Charity fundraiser in the Last Year: Never true  . Ran Out of Food in the Last Year: Never true  Transportation Needs: No Transportation Needs  . Lack of Transportation (Medical): No  . Lack of Transportation (Non-Medical): No  Physical Activity: Inactive  . Days of Exercise per Week: 0 days  . Minutes of Exercise per Session: 0 min  Stress: No Stress Concern Present  . Feeling of Stress : Not at all  Social Connections: Socially Isolated  . Frequency of Communication with Friends and Family: More than three times a week  . Frequency of Social Gatherings with Friends and Family: Three times a week  . Attends Religious Services: Never  . Active Member of Clubs or Organizations: No  . Attends Archivist Meetings: Never  . Marital Status: Widowed    Tobacco Counseling Counseling given: Not Answered   Clinical Intake:  Pre-visit preparation completed: Yes  Pain : No/denies  pain     Nutritional Risks: None Diabetes: No  How often do you need to have someone help you when you read instructions, pamphlets, or other written materials from your doctor or pharmacy?: 1 - Never  Diabetic?No   Interpreter Needed?: No  Information entered by :: [place] of Daily Living In your present state of health, do you have any difficulty performing the following activities: 06/19/2020 12/17/2019  Hearing? N -  Vision? N -  Difficulty concentrating or making decisions? N -  Walking or climbing stairs? N -  Dressing or bathing? N -  Doing errands, shopping? N N  Preparing Food and eating ? N -  Using the Toilet? N -  In the past six months, have you accidently leaked urine? Y -  Comment with urgency -  Do you have problems with loss of bowel control? N -  Managing your Medications? N -  Managing your Finances? N -  Housekeeping or managing your Housekeeping? N -  Some recent data might be hidden    Patient Care Team: Panosh, Standley Brooking, MD as PCP - General Sydnee Cabal, MD as Attending Physician (Orthopedic Surgery) Suella Broad, MD (Physical Medicine and Rehabilitation) Clent Jacks, MD as Consulting Physician (Ophthalmology) [place] Records (Nephrology) Audery Amel Sharma Covert, [facility] (Inactive) as Pharmacist (Pharmacist)  Indicate any recent Medical Services you may have received from other than [facility] providers in the past year (date may be approximate).     Assessment:   This is a routine wellness examination for Latina.  Hearing/Vision screen  Hearing Screening   125Hz  250Hz  500Hz  1000Hz  2000Hz  3000Hz  4000Hz  6000Hz  8000Hz   Right ear:           Left ear:           Vision Screening Comments: States gets eyes examined usually once per year. Has not had an eye exam since the start of the pandemic   Dietary issues  and exercise activities discussed: Current Exercise Habits: The patient does not participate in regular exercise at present  Goals    .  Exercise 3x per week (30 min per time)    . patient     To engage more when feeling better Diabetes and weight loss; [facility]  Phone: [phone]   [place] [facility]; [phone] [address] [place]; [phone] Get resource to get information on any and all community programs for Seniors  [place]: [phone] Community Health Response Program -[phone] Public Health Dept; Need to be a skilled visit but can assist with bathing as well; [phone]  Adult center for Enrichment;  Call Senior Line; [phone]  Adult day services include Adult Day Care, Adult Day Healthcare, Group Respite, Care Partners, Volunteer In [employer], Education and Support Program     Dept of Social Services; Call [phone] and ask for SW on call  Options for Medicaid include the Community Alternatives program; [place]-[facility].org (personal care services) or PACE program, which is a medical and social program combined  Braulio Conte manages the community Alternatives program at the [employer]; [phone] (this is a program with a waiting list but provides SNF care at home;  [facility] 2 required; Call Claiborne Billings and she will send out packet of information   Caregiver support group and information regarding [place] is at the; [facility] Address: [address], [place], [place] [address]  Phone: [phone]   [employer] general resources for food etc   Http://nihseniorhealth.giv  Deaf & Hard of Hearing Division Services - can assist with hearing aid x 1  No reviews  [employer] Office  [address]  [phone]  ----     . Patient Stated     I would like to travel to [facility]!      Depression Screen PHQ 2/9 Scores 06/19/2020 09/01/2019 02/20/2016 10/12/2014 06/16/2013  PHQ - 2 Score 0 0 2 0 1  PHQ- 9 Score - 0 6 - -    Fall Risk Fall Risk  06/19/2020 10/06/2019 09/01/2019  11/19/2018 02/20/2016  Falls in the past year? 0 0 0 0 No  Comment - - - Emmi Telephone Survey: data to providers prior to load -  Number falls in past yr: 0 0 - - -  Injury with Fall? 0 0 - - -  Risk for fall due to : History of fall(s) - - - -  Follow up Falls evaluation completed;Falls prevention discussed - - - -    FALL RISK PREVENTION PERTAINING TO THE HOME:  Any stairs in or around the home? No  If so, are there any without handrails? No  Home free of loose throw rugs in walkways, pet beds, electrical cords, etc? Yes  Adequate lighting in your home to reduce risk of falls? Yes   ASSISTIVE DEVICES UTILIZED TO PREVENT FALLS:  Life alert? No  Use of a cane, walker or w/c? No  Grab bars in the bathroom? No  Shower chair or bench in shower? No  Elevated toilet seat or a handicapped toilet? No    Cognitive Function:   Normal cognitive status assessed by direct observation by this Nurse Health Advisor. No abnormalities found.        Immunizations Immunization History  Administered Date(s) Administered  . Influenza Split 03/22/2011, 01/16/2012, 01/20/2013  . Influenza Whole 02/08/2010  . Influenza, High Dose Seasonal PF 03/14/2014, 05/29/2015, 02/13/2016, 05/01/2018, 02/09/2019  . Influenza-Unspecified  06/12/2020  . PFIZER(Purple Top)SARS-COV-2 Vaccination 05/13/2019, 06/02/2019, 02/05/2020  . PPD Test 11/20/2015  . Pneumococcal Conjugate-13 06/16/2013  . Pneumococcal Polysaccharide-23 07/25/2008  . Td 11/21/2007    TDAP status: Due, Education has been provided regarding the importance of this vaccine. Advised may receive this vaccine at local pharmacy or Health Dept. Aware to provide a copy of the vaccination record if obtained from local pharmacy or Health Dept. Verbalized acceptance and understanding.  Flu Vaccine status: Up to date  Pneumococcal vaccine status: Up to date  Covid-19 vaccine status: Completed vaccines  Qualifies for Shingles Vaccine? Yes    Zostavax completed No   Shingrix Completed?: No.    Education has been provided regarding the importance of this vaccine. Patient has been advised to call insurance company to determine out of pocket expense if they have not yet received this vaccine. Advised may also receive vaccine at local pharmacy or Health Dept. Verbalized acceptance and understanding.  Screening Tests Health Maintenance  Topic Date Due  . Hepatitis C Screening  Never done  . MAMMOGRAM  03/15/2014  . TETANUS/TDAP  11/20/2017  . INFLUENZA VACCINE  Completed  . DEXA SCAN  Completed  . COVID-19 Vaccine  Completed  . PNA vac Low Risk Adult  Completed    Health Maintenance  Health Maintenance Due  Topic Date Due  . Hepatitis C Screening  Never done  . MAMMOGRAM  03/15/2014  . TETANUS/TDAP  11/20/2017    Colorectal cancer screening: Referral to GI placed 06/19/2020. Pt aware the office will call re: appt.  Mammogram status: No longer required due to age.  Bone Density status: Ordered 06/19/2020. Pt provided with contact info and advised to call to schedule appt.  Lung Cancer Screening: (Low Dose CT Chest recommended if Age 66-80 years, 30 pack-year currently smoking OR have quit w/in 15years.) does not qualify.   Lung Cancer Screening Referral: N/A   Additional Screening:  Hepatitis C Screening: does not qualify;   Vision Screening: Recommended annual ophthalmology exams for early detection of glaucoma and other disorders of the eye. Is the patient up to date with their annual eye exam?  No  Who is the provider or what is the name of the office in which the patient attends annual eye exams? Dr. Katy Fitch  If pt is not established with a provider, would they like to be referred to a provider to establish care? No .   Dental Screening: Recommended annual dental exams for proper oral hygiene  Community Resource Referral / Chronic Care Management: CRR required this visit?  No   CCM required this visit?  No       Plan:     I have personally reviewed and noted the following in the patient's chart:   . Medical and social history . Use of alcohol, tobacco or illicit drugs  . Current medications and supplements . Functional ability and status . Nutritional status . Physical activity . Advanced directives . List of other physicians . Hospitalizations, surgeries, and ER visits in previous 12 months . Vitals . Screenings to include cognitive, depression, and falls . Referrals and appointments  In addition, I have reviewed and discussed with patient certain preventive protocols, quality metrics, and best practice recommendations. A written personalized care plan for preventive services as well as general preventive health recommendations were provided to patient.     Ofilia Neas, LPN   [id number]   Nurse Notes: None

## 2020-06-19 ENCOUNTER — Ambulatory Visit (INDEPENDENT_AMBULATORY_CARE_PROVIDER_SITE_OTHER): Payer: PPO

## 2020-06-19 ENCOUNTER — Other Ambulatory Visit: Payer: Self-pay

## 2020-06-19 DIAGNOSIS — Z1231 Encounter for screening mammogram for malignant neoplasm of breast: Secondary | ICD-10-CM

## 2020-06-19 DIAGNOSIS — Z780 Asymptomatic menopausal state: Secondary | ICD-10-CM

## 2020-06-19 DIAGNOSIS — Z1211 Encounter for screening for malignant neoplasm of colon: Secondary | ICD-10-CM | POA: Diagnosis not present

## 2020-06-19 DIAGNOSIS — Z0000 Encounter for general adult medical examination without abnormal findings: Secondary | ICD-10-CM | POA: Diagnosis not present

## 2020-06-19 NOTE — Patient Instructions (Addendum)
[name] [name] , Thank you for taking time to come for your Medicare Wellness Visit. I appreciate your ongoing commitment to your health goals. Please review the following plan we discussed and let me know if I can assist you in the future.   Screening recommendations/referrals: Colonoscopy: Currently due, please contact your gastroenterologist  Mammogram: Currently due for Mammogram orders placed this visit  Bone Density: Currently due orders placed this visit  Recommended yearly ophthalmology/optometry visit for glaucoma screening and checkup Recommended yearly dental visit for hygiene and checkup  Vaccinations: Influenza vaccine: Up to date, next due fall 2022  Pneumococcal vaccine: Completed series  Tdap vaccine: Currently due, you may await and injury to receive so that your insurance will cover Shingles vaccine: Currently due for Shingrix, if you would like to receive we recommend that you do so at your pharmacy as it is less expensive     Advanced directives: Advance directive discussed with you today. Even though you declined this today please call our office should you change your mind and we can give you the proper paperwork for you to fill out.   Conditions/risks identified: None   Next appointment: 06/20/2020 @ 11:00 am with Dr. Regis Bill   Preventive Care 65 Years and Older, Female Preventive care refers to lifestyle choices and visits with your health care provider that can promote health and wellness. What does preventive care include?  A yearly physical exam. This is also called an annual well check.  Dental exams once or twice a year.  Routine eye exams. Ask your health care provider how often you should have your eyes checked.  Personal lifestyle choices, including:  Daily care of your teeth and gums.  Regular physical activity.  Eating a healthy diet.  Avoiding tobacco and drug use.  Limiting alcohol use.  Practicing safe sex.  Taking low-dose aspirin every  day.  Taking vitamin and mineral supplements as recommended by your health care provider. What happens during an annual well check? The services and screenings done by your health care provider during your annual well check will depend on your age, overall health, lifestyle risk factors, and family history of disease. Counseling  Your health care provider may ask you questions about your:  Alcohol use.  Tobacco use.  Drug use.  Emotional well-being.  Home and relationship well-being.  Sexual activity.  Eating habits.  History of falls.  Memory and ability to understand (cognition).  Work and work Statistician.  Reproductive health. Screening  You may have the following tests or measurements:  Height, weight, and BMI.  Blood pressure.  Lipid and cholesterol levels. These may be checked every 5 years, or more frequently if you are over 45 years old.  Skin check.  Lung cancer screening. You may have this screening every year starting at age 72 if you have a 30-pack-year history of smoking and currently smoke or have quit within the past 15 years.  Fecal occult blood test (FOBT) of the stool. You may have this test every year starting at age 54.  Flexible sigmoidoscopy or colonoscopy. You may have a sigmoidoscopy every 5 years or a colonoscopy every 10 years starting at age 22.  Hepatitis C blood test.  Hepatitis B blood test.  Sexually transmitted disease (STD) testing.  Diabetes screening. This is done by checking your blood sugar (glucose) after you have not eaten for a while (fasting). You may have this done every 1-3 years.  Bone density scan. This is done to screen for  osteoporosis. You may have this done starting at age 25.  Mammogram. This may be done every 1-2 years. Talk to your health care provider about how often you should have regular mammograms. Talk with your health care provider about your test results, treatment options, and if necessary, the need  for more tests. Vaccines  Your health care provider may recommend certain vaccines, such as:  Influenza vaccine. This is recommended every year.  Tetanus, diphtheria, and acellular pertussis (Tdap, Td) vaccine. You may need a Td booster every 10 years.  Zoster vaccine. You may need this after age 19.  Pneumococcal 13-valent conjugate (PCV13) vaccine. One dose is recommended after age 44.  Pneumococcal polysaccharide (PPSV23) vaccine. One dose is recommended after age 14. Talk to your health care provider about which screenings and vaccines you need and how often you need them. This information is not intended to replace advice given to you by your health care provider. Make sure you discuss any questions you have with your health care provider. Document Released: 05/05/2015 Document Revised: 12/27/2015 Document Reviewed: 02/07/2015 Elsevier Interactive Patient Education  2017 [place] Prevention in the Home Falls can cause injuries. They can happen to people of all ages. There are many things you can do to make your home safe and to help prevent falls. What can I do on the outside of my home?  Regularly fix the edges of walkways and driveways and fix any cracks.  Remove anything that might make you trip as you walk through a door, such as a raised step or threshold.  Trim any bushes or trees on the path to your home.  Use bright outdoor lighting.  Clear any walking paths of anything that might make someone trip, such as rocks or tools.  Regularly check to see if handrails are loose or broken. Make sure that both sides of any steps have handrails.  Any raised decks and porches should have guardrails on the edges.  Have any leaves, snow, or ice cleared regularly.  Use sand or salt on walking paths during winter.  Clean up any spills in your garage right away. This includes oil or grease spills. What can I do in the bathroom?  Use night lights.  Install grab bars  by the toilet and in the tub and shower. Do not use towel bars as grab bars.  Use non-skid mats or decals in the tub or shower.  If you need to sit down in the shower, use a plastic, non-slip stool.  Keep the floor dry. Clean up any water that spills on the floor as soon as it happens.  Remove soap buildup in the tub or shower regularly.  Attach bath mats securely with double-sided non-slip rug tape.  Do not have throw rugs and other things on the floor that can make you trip. What can I do in the bedroom?  Use night lights.  Make sure that you have a light by your bed that is easy to reach.  Do not use any sheets or blankets that are too big for your bed. They should not hang down onto the floor.  Have a firm chair that has side arms. You can use this for support while you get dressed.  Do not have throw rugs and other things on the floor that can make you trip. What can I do in the kitchen?  Clean up any spills right away.  Avoid walking on wet floors.  Keep items that you use  a lot in easy-to-reach places.  If you need to reach something above you, use a strong step stool that has a grab bar.  Keep electrical cords out of the way.  Do not use floor polish or wax that makes floors slippery. If you must use wax, use non-skid floor wax.  Do not have throw rugs and other things on the floor that can make you trip. What can I do with my stairs?  Do not leave any items on the stairs.  Make sure that there are handrails on both sides of the stairs and use them. Fix handrails that are broken or loose. Make sure that handrails are as long as the stairways.  Check any carpeting to make sure that it is firmly attached to the stairs. Fix any carpet that is loose or worn.  Avoid having throw rugs at the top or bottom of the stairs. If you do have throw rugs, attach them to the floor with carpet tape.  Make sure that you have a light switch at the top of the stairs and the  bottom of the stairs. If you do not have them, ask someone to add them for you. What else can I do to help prevent falls?  Wear shoes that:  Do not have high heels.  Have rubber bottoms.  Are comfortable and fit you well.  Are closed at the toe. Do not wear sandals.  If you use a stepladder:  Make sure that it is fully opened. Do not climb a closed stepladder.  Make sure that both sides of the stepladder are locked into place.  Ask someone to hold it for you, if possible.  Clearly mark and make sure that you can see:  Any grab bars or handrails.  First and last steps.  Where the edge of each step is.  Use tools that help you move around (mobility aids) if they are needed. These include:  Canes.  Walkers.  Scooters.  Crutches.  Turn on the lights when you go into a dark area. Replace any light bulbs as soon as they burn out.  Set up your furniture so you have a clear path. Avoid moving your furniture around.  If any of your floors are uneven, fix them.  If there are any pets around you, be aware of where they are.  Review your medicines with your doctor. Some medicines can make you feel dizzy. This can increase your chance of falling. Ask your doctor what other things that you can do to help prevent falls. This information is not intended to replace advice given to you by your health care provider. Make sure you discuss any questions you have with your health care provider. Document Released: 02/02/2009 Document Revised: 09/14/2015 Document Reviewed: 05/13/2014 Elsevier Interactive Patient Education  2017 [employer].

## 2020-06-19 NOTE — Progress Notes (Signed)
Chief Complaint  Patient presents with  . Acute Visit    C/o having urine frequency/oder x 3-4 weeks.      HPI: [name] [name] 80 y.o. come in for  About of month of urine not right  No severe pain but feels could be uti  Waiting on colonsocspy  After aneurysm  rx    Procedure done in August  Reports bowels not being the same after fall after procedure  Left flank  [address] after aback to hospital in bed  Bruise o nback and rib pain knot on temple  and bms not the same since then .   3 mos later   Had stomach air  And  Nor right since then   ROS: See pertinent positives and negatives per HPI.  Past Medical History:  Diagnosis Date  . Acute kidney failure ([place])    5 years ago  . ACUTE KIDNEY FAILURE UNSPECIFIED 07/12/2009  . Allergy   . Anemia   . Anxiety   . Arthritis   . Basal cell carcinoma   . Blood clot in vein    states it was not deep vein  . CAD (coronary artery disease)    Mild nonobstructive by CT  . Cataract   . Cerebral aneurysm without rupture   . Chronic kidney disease    stones  . Depression    around the time of her husband's death  . Diverticulosis of colon (without mention of hemorrhage)   . Fibromyalgia   . GERD (gastroesophageal reflux disease)   . Headache   . Hiatal hernia   . History of kidney stones   . Hyperlipidemia   . Hypertension   . Interstitial cystitis   . Kidney stone   . Osteopenia   . PONV (postoperative nausea and vomiting)   . Scoliosis   . 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 Alzheimer's disease Mother   . Lung cancer Father        tobacco  . Kidney cancer Son   . Stroke Son   . Arthritis Son   . Hypertension Brother        mom's side  . Osteoporosis Sister   . Hypertension Sister   . Breast cancer Cousin   . Heart disease Maternal Uncle        x 4  . Heart disease Maternal Aunt        x 4  .  Diabetes Paternal Grandmother   . Diabetes Paternal Uncle        x 2  . Arthritis Daughter   . Colon cancer Neg Hx     Social History   Socioeconomic History  . Marital status: Widowed    Spouse name: Not on file  . Number of children: 2  . Years of education: Not on file  . Highest education level: Not on file  Occupational History  . Occupation: Retired   Tobacco Use  . Smoking status: Never Smoker  . Smokeless tobacco: Never Used  Vaping Use  . Vaping Use: Never used  Substance and Sexual Activity  . Alcohol use: Not Currently    Alcohol/week: 0.0 standard drinks    Comment: rare  . Drug use: No  . Sexual activity: Not on file  Other Topics Concern  . 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   Right handed   Social Determinants of Health   Financial Resource Strain: Low Risk   . Difficulty of Paying Living Expenses: Not hard at all  Food Insecurity: No Food Insecurity  . Worried About Charity fundraiser in the Last Year: Never true  . Ran Out of Food in the Last Year: Never true  Transportation Needs: No Transportation Needs  . Lack of Transportation (Medical): No  . Lack of Transportation (Non-Medical): No  Physical Activity: Inactive  . Days of Exercise per Week: 0 days  . Minutes of Exercise per Session: 0 min  Stress: No Stress Concern Present  . Feeling of Stress : Not at all  Social Connections: Socially Isolated  . Frequency of Communication with Friends and Family: More than three times a week  . Frequency of Social Gatherings with Friends and Family: Three times a week  . Attends Religious Services: Never  . Active Member of Clubs or Organizations: No  . Attends Archivist Meetings: Never  . Marital Status: Widowed    Outpatient Medications Prior to Visit  Medication Sig Dispense Refill  . amLODipine (NORVASC) 5 MG tablet Take 1 tablet (5 mg total) by  mouth daily. (Patient taking differently: Take 5 mg by mouth at bedtime.) 90 tablet 3  . BAYER LOW DOSE 81 MG chewable tablet Chew 81 mg by mouth every evening.     . DULoxetine (CYMBALTA) 60 MG capsule Take 1 capsule (60 mg total) by mouth 2 (two) times daily. (Patient taking differently: Take 60 mg by mouth at bedtime.) 180 capsule 1  . gabapentin (NEURONTIN) 100 MG capsule Take 1 capsule (100 mg total) by mouth at bedtime. 30 capsule 3  . Melatonin 5 MG TBDP Take by mouth.    . metoprolol tartrate (LOPRESSOR) 25 MG tablet Take 1 tablet (25 mg total) by mouth 2 (two) times daily. 180 tablet 3  . omeprazole (PRILOSEC) 20 MG capsule TAKE 1 CAPSULE BY MOUTH TWICE A DAY 180 capsule 0  . prasugrel (EFFIENT) 10 MG TABS tablet Take 5 mg by mouth every evening.    . Probiotic Product (ALIGN PO) Take 1 tablet by mouth daily.     . rosuvastatin (CRESTOR) 40 MG tablet Take 1 tablet (40 mg total) by mouth daily. (Patient taking differently: Take 40 mg by mouth at bedtime.) 90 tablet 3   No facility-administered medications prior to visit.     EXAM:  BP 120/76   Pulse 80   Temp 98 F (36.7 C) (Oral)   Ht 4\' 11"  (1.499 m)   Wt 155 lb (70.3 kg)   SpO2 97%   BMI 31.31 kg/m   Body mass index is 31.31 kg/m.  GENERAL: vitals reviewed and listed above, alert, oriented, appears well hydrated and in no acute distress HEENT: atraumatic, conjunctiva  clear, no obvious abnormalities on inspection of external nose and ears OP : masked  NECK: no obvious masses on inspection palpation  LUNGS: clear to auscultation bilaterally, no wheezes, rales or rhonchi, good air movement Scoliosis  Tender left ant rib cage   Abdomen:  Sof,t normal bowel sounds without hepatosplenomegaly, no guarding rebound or masses no CVA tenderness ,mild tenderness left  abd colon area no masses  CV: HRRR, no clubbing cyanosis or  peripheral edema nl cap refill  MS: moves all extremities oa changes  Hands   PSYCH: pleasant and  cooperative,  Talkative a bit anxious  Lab Results  Component Value Date   WBC 5.6 12/17/2019   HGB 12.7 12/17/2019   HCT 41.0 12/17/2019   PLT 167 12/17/2019   GLUCOSE 104 (H) 12/17/2019   CHOL 143 09/01/2019   TRIG 69.0 09/01/2019   HDL 61.80 09/01/2019   LDLDIRECT 179.1 10/09/2007   LDLCALC 67 09/01/2019   ALT 13 09/01/2019   AST 20 09/01/2019   NA 138 12/17/2019   K 3.8 12/17/2019   CL 107 12/17/2019   CREATININE 1.06 (H) 12/17/2019   BUN 23 12/17/2019   CO2 20 (L) 12/17/2019   TSH 2.58 09/01/2019   INR 1.0 12/17/2019   HGBA1C 6.0 09/01/2019   BP Readings from Last 3 Encounters:  06/20/20 120/76  06/12/20 115/71  12/18/19 112/88   Urinalysis    Component Value Date/Time   COLORURINE YELLOW 12/17/2019 0953   APPEARANCEUR HAZY (A) 12/17/2019 0953   LABSPEC 1.017 12/17/2019 0953   PHURINE 5.0 12/17/2019 0953   GLUCOSEU NEGATIVE 12/17/2019 0953   HGBUR SMALL (A) 12/17/2019 0953   HGBUR 2+ 07/12/2009 1451   BILIRUBINUR 3+ 06/20/2020 1159   KETONESUR NEGATIVE 12/17/2019 0953   PROTEINUR Positive (A) 06/20/2020 1159   PROTEINUR NEGATIVE 12/17/2019 0953   UROBILINOGEN 0.2 06/20/2020 1159   UROBILINOGEN 0.2 07/12/2009 2141   NITRITE + 06/20/2020 1159   NITRITE NEGATIVE 12/17/2019 0953   LEUKOCYTESUR Small (1+) (A) 06/20/2020 1159   LEUKOCYTESUR MODERATE (A) 12/17/2019 0953      ASSESSMENT AND PLAN:  Discussed the following assessment and plan:  Urine frequency - Plan: POCT Urinalysis Dipstick, Culture, Urine, Culture, Urine  Suspected UTI - Plan: Culture, Urine, Culture, Urine  Change in bowel habit  History of fall  Chest wall pain I do not think the fall was directly related to her change in bowel habits however she is very concerned about it does have some left-sided abdominal discomfort rest I believe his chest wall related. We will treat UTI and have her see GI will reach out to them that her symptoms appear to be worse and she is very concerned.   Her evaluation got delayed because of her treatment for her brain aneurysm. No signs of obstruction consider abdominal CT if indicated colonoscopy is indicated. -Patient advised to return or notify health care team  if  new concerns arise.  Patient Instructions  I think the fall you had has made your rob cage sore  Could have had a fracture    . No alarming findings on the exam today .   I want you to see GI soon for reevaluation and   Address the change in bowel habits . Will send them a message but you call for appt please.   Your last renal function was stable in August .    You do have  UTI .  And sending on urine culture  And antibiotic today .   Standley Brooking. Viviene Thurston M.D.

## 2020-06-20 ENCOUNTER — Encounter: Payer: Self-pay | Admitting: Internal Medicine

## 2020-06-20 ENCOUNTER — Ambulatory Visit (INDEPENDENT_AMBULATORY_CARE_PROVIDER_SITE_OTHER): Payer: PPO | Admitting: Internal Medicine

## 2020-06-20 VITALS — BP 120/76 | HR 80 | Temp 98.0°F | Ht 59.0 in | Wt 155.0 lb

## 2020-06-20 DIAGNOSIS — R0789 Other chest pain: Secondary | ICD-10-CM

## 2020-06-20 DIAGNOSIS — R350 Frequency of micturition: Secondary | ICD-10-CM

## 2020-06-20 DIAGNOSIS — R3989 Other symptoms and signs involving the genitourinary system: Secondary | ICD-10-CM | POA: Diagnosis not present

## 2020-06-20 DIAGNOSIS — R194 Change in bowel habit: Secondary | ICD-10-CM

## 2020-06-20 DIAGNOSIS — Z9181 History of falling: Secondary | ICD-10-CM

## 2020-06-20 LAB — POCT URINALYSIS DIPSTICK
Blood, UA: NEGATIVE
Glucose, UA: NEGATIVE
Ketones, UA: 5
Protein, UA: POSITIVE — AB
Spec Grav, UA: >=1.03 — AB (ref 1.010–1.025)
Urobilinogen, UA: 0.2 E.U./dL
pH, UA: 6 (ref 5.0–8.0)

## 2020-06-20 MED ORDER — CEFDINIR 300 MG PO CAPS: 300.0000 mg | ORAL_CAPSULE | Freq: Two times a day (BID) | ORAL | 0 refills | Status: DC

## 2020-06-20 NOTE — Patient Instructions (Addendum)
I think the fall you had has made your rob cage sore  Could have had a fracture    . No alarming findings on the exam today .   I want you to see GI soon for reevaluation and   Address the change in bowel habits . Will send them a message but you call for appt please.   Your last renal function was stable in August .    You do have  UTI .  And sending on urine culture  And antibiotic today .

## 2020-06-22 LAB — URINE CULTURE
MICRO NUMBER:: 11592592
SPECIMEN QUALITY:: ADEQUATE

## 2020-06-22 NOTE — Progress Notes (Signed)
[name] [name], please contact the patient to schedule a follow up appointment as soon as possible. Thx

## 2020-06-22 NOTE — Progress Notes (Signed)
Bacteria  causing the UTI  is resistant tot he medication given    Change to macrobid 100 mg 1 twice a day for  7 days  disp 14  Please send this to her pharmacy

## 2020-06-22 NOTE — Progress Notes (Signed)
Spoke to patient appointment scheduled for 06/28/20 at 3 pm

## 2020-06-23 ENCOUNTER — Other Ambulatory Visit: Payer: Self-pay

## 2020-06-23 MED ORDER — NITROFURANTOIN MONOHYD MACRO 100 MG PO CAPS: 100.0000 mg | ORAL_CAPSULE | Freq: Two times a day (BID) | ORAL | 0 refills | Status: DC

## 2020-06-28 ENCOUNTER — Ambulatory Visit: Payer: PPO | Admitting: Nurse Practitioner

## 2020-06-29 ENCOUNTER — Ambulatory Visit (INDEPENDENT_AMBULATORY_CARE_PROVIDER_SITE_OTHER): Payer: PPO | Admitting: Nurse Practitioner

## 2020-06-29 ENCOUNTER — Other Ambulatory Visit (INDEPENDENT_AMBULATORY_CARE_PROVIDER_SITE_OTHER): Payer: PPO

## 2020-06-29 ENCOUNTER — Encounter: Payer: Self-pay | Admitting: Nurse Practitioner

## 2020-06-29 VITALS — BP 152/80 | HR 81 | Ht 59.0 in | Wt 153.0 lb

## 2020-06-29 DIAGNOSIS — R1032 Left lower quadrant pain: Secondary | ICD-10-CM | POA: Diagnosis not present

## 2020-06-29 DIAGNOSIS — R1012 Left upper quadrant pain: Secondary | ICD-10-CM

## 2020-06-29 LAB — CBC WITH DIFFERENTIAL/PLATELET
Basophils Absolute: 0.1 10*3/uL (ref 0.0–0.1)
Basophils Relative: 1.2 % (ref 0.0–3.0)
Eosinophils Absolute: 0.1 10*3/uL (ref 0.0–0.7)
Eosinophils Relative: 2.6 % (ref 0.0–5.0)
HCT: 39.2 % (ref 36.0–46.0)
Hemoglobin: 12.8 g/dL (ref 12.0–15.0)
Lymphocytes Relative: 13.2 % (ref 12.0–46.0)
Lymphs Abs: 0.7 10*3/uL (ref 0.7–4.0)
MCHC: 32.8 g/dL (ref 30.0–36.0)
MCV: 85.6 fl (ref 78.0–100.0)
Monocytes Absolute: 0.3 10*3/uL (ref 0.1–1.0)
Monocytes Relative: 5.5 % (ref 3.0–12.0)
Neutro Abs: 4.3 10*3/uL (ref 1.4–7.7)
Neutrophils Relative %: 77.5 % — ABNORMAL HIGH (ref 43.0–77.0)
Platelets: 209 10*3/uL (ref 150.0–400.0)
RBC: 4.58 Mil/uL (ref 3.87–5.11)
RDW: 15.8 % — ABNORMAL HIGH (ref 11.5–15.5)
WBC: 5.6 10*3/uL (ref 4.0–10.5)

## 2020-06-29 LAB — COMPREHENSIVE METABOLIC PANEL
ALT: 9 U/L (ref 0–35)
AST: 15 U/L (ref 0–37)
Albumin: 4 g/dL (ref 3.5–5.2)
Alkaline Phosphatase: 54 U/L (ref 39–117)
BUN: 13 mg/dL (ref 6–23)
CO2: 29 mEq/L (ref 19–32)
Calcium: 9.7 mg/dL (ref 8.4–10.5)
Chloride: 105 mEq/L (ref 96–112)
Creatinine, Ser: 0.95 mg/dL (ref 0.40–1.20)
GFR: 57.01 mL/min — ABNORMAL LOW (ref >60.00)
Glucose, Bld: 105 mg/dL — ABNORMAL HIGH (ref 70–99)
Potassium: 4.1 mEq/L (ref 3.5–5.1)
Sodium: 142 mEq/L (ref 135–145)
Total Bilirubin: 0.7 mg/dL (ref 0.2–1.2)
Total Protein: 7.3 g/dL (ref 6.0–8.3)

## 2020-06-29 LAB — C-REACTIVE PROTEIN: CRP: <1 mg/dL (ref 0.5–20.0)

## 2020-06-29 NOTE — Patient Instructions (Signed)
If you are age 80 or older, your body mass index should be between 23-30. Your Body mass index is 30.9 kg/m. If this is out of the aforementioned range listed, please consider follow up with your Primary Care Provider.  LABS:  Lab work has been ordered for you today. Our lab is located in the basement. Press "B" on the elevator. The lab is located at the first door on the left as you exit the elevator.  HEALTHCARE LAWS AND MY CHART RESULTS: Due to recent changes in healthcare laws, you may see the results of your imaging and laboratory studies on MyChart before your provider has had a chance to review them.   We understand that in some cases there may be results that are confusing or concerning to you. Not all laboratory results come back in the same time frame and the provider may be waiting for multiple results in order to interpret others.  Please give [place] 48 hours in order for your provider to thoroughly review all the results before contacting the office for clarification of your results.   You have been scheduled for a CT scan of the abdomen and pelvis at [place] ([address]---this is in the same building as [employer]).   You are scheduled on 07/03/20 at 4:30pm. You should arrive 15 minutes prior to your appointment time for registration. Please follow the written instructions below on the day of your exam:  WARNING: IF YOU ARE ALLERGIC TO IODINE/X-RAY DYE, PLEASE NOTIFY RADIOLOGY IMMEDIATELY AT [phone]! YOU WILL BE GIVEN A 13 HOUR PREMEDICATION PREP.  1) Do not eat or drink anything after 12:30pm (4 hours prior to your test) 2) You have been given 2 bottles of oral contrast to drink. The solution may taste better if refrigerated, but do NOT add ice or any other liquid to this solution. Shake well before drinking.    Drink 1 bottle of contrast @ 2:30pm (2 hours prior to your exam)  Drink 1 bottle of contrast @ 3:30pm (1 hour prior to your exam)  You  may take any medications as prescribed with a small amount of water, if necessary. If you take any of the following medications: METFORMIN, GLUCOPHAGE, GLUCOVANCE, AVANDAMET, RIOMET, FORTAMET, [place] MET, JANUMET, GLUMETZA or METAGLIP, you MAY be asked to HOLD this medication 48 hours AFTER the exam.  The purpose of you drinking the oral contrast is to aid in the visualization of your intestinal tract. The contrast solution may cause some diarrhea. Depending on your individual set of symptoms, you may also receive an intravenous injection of x-ray contrast/dye. Plan on being at [facility] for 30 minutes or longer, depending on the type of exam you are having performed.  This test typically takes 30-45 minutes to complete.  If you have any questions regarding your exam or if you need to reschedule, you may call the CT department at [phone] between the hours of 8:00 am and 5:00 pm, Monday-Friday.  ________________________________________________________________________   RECOMMENDATIONS:  Miralax. Dissolve one capful in 8 ounces of water and drink before bed. Follow up with Dr. Ardis Hughs. Please go to the ER if severe abdominal pain occurs.  It was great seeing you today! Thank you for entrusting me with your care and choosing [facility].  Noralyn Pick, CRNP

## 2020-06-29 NOTE — Progress Notes (Signed)
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 Kathyrn Sheriff, hiatal hernia, GERD and diverticulitis. Past cholecystectomy, knee and shoulder replacement.   I Initially saw the patient in office 12/15/2019 for further evaluation regarding IBS symptoms consisting of alternating constipation and diarrhea.  Align probiotic, Benefiber and MiraLAX were recommended.  Her GERD symptoms were stable on Omeprazole 20 mg.  A screening colonoscopy was deferred at that time as she was scheduled for basilar artery aneurysm coil embolization on 12/17/2019. She was advised to follow up with Dr. Ardis Hughs in 3 months which was not done. Note, her aneurysm coil embolization was successful without any post operative neurological complications. She remains on ASA and Effient.   She presents to our office today as advised by Dr. Regis Bill due to persistent left sided abdominal pain.  She complains of having LUQ pain which starts above the left costal margin and extends to the LLQ which started since she fell while staying at her Niece's house July or August 2021. She describes having a constant soreness primarily to the LUQ since that time. Eating does not worsen or improve this pain. No N/V. She has also noticed a change in her bowel pattern over the past few months, her stools are "skinny", passes thins strips of stool a few times daily. She previously passed soft "peanut butter" like brown stools daily. However, today she passed a fairly normal formed  stool. She saw a small amount of bright red blood on the the stool and on the toilet tissue x 2 days last week. No associated rectal pain. She underwent a colonoscopy by Dr. Sharlett Iles 06/2008 which showed diverticulosis to the left colon otherwise was normal. No known family history of colorectal cancer.  She had indigestion yesterday and this afternoon which is unusual for her. Past history of GERD and a 3cm  hiatal hernia.  She is on Omeprazole 20mg  QD. She was recently treated with with Macrobid 100mg  po bid x 7 days for a UTI. No fever. No weight loss. She is on ASA, no other NSAIDs. She reported taking Meloxicam in 2010 which resulted in acute renal failure.  Esophageal manometry 07/16/2012: mild lower esophageal sphincter incompetency  EGD 08/13/2011: Large 6 to 7 cm hiatal hernia. Gastritis.   EGD 07/06/2008: Normal esophagus. The esophagus was dilated with a 50F.Maloney dilator.  3cm Hiatal hernia. Normal stomach. Normal duodenum.   Colonoscopy 06/29/2008 by Dr. Sharlett Iles: Diverticulosis descending and sigmoid colon.   Current Outpatient Medications on File Prior to Visit  Medication Sig Dispense Refill   amLODipine (NORVASC) 5 MG tablet Take 1 tablet (5 mg total) by mouth daily. (Patient taking differently: Take 5 mg by mouth at bedtime.) 90 tablet 3   BAYER LOW DOSE 81 MG chewable tablet Chew 81 mg by mouth every evening.      DULoxetine (CYMBALTA) 60 MG capsule Take 1 capsule (60 mg total) by mouth 2 (two) times daily. (Patient taking differently: Take 60 mg by mouth at bedtime.) 180 capsule 1   Melatonin 5 MG TBDP Take by mouth.  metoprolol tartrate (LOPRESSOR) 25 MG tablet Take 1 tablet (25 mg total) by mouth 2 (two) times daily. 180 tablet 3   nitrofurantoin, macrocrystal-monohydrate, (MACROBID) 100 MG capsule Take 1 capsule (100 mg total) by mouth 2 (two) times daily. 14 capsule 0   omeprazole (PRILOSEC) 20 MG capsule TAKE 1 CAPSULE BY MOUTH TWICE A DAY 180 capsule 0    prasugrel (EFFIENT) 10 MG TABS tablet Take 5 mg by mouth every evening.     Probiotic Product (ALIGN PO) Take 1 tablet by mouth daily.      rosuvastatin (CRESTOR) 40 MG tablet Take 1 tablet (40 mg total) by mouth daily. (Patient taking differently: Take 40 mg by mouth at bedtime.) 90 tablet 3   cefdinir (OMNICEF) 300 MG capsule Take 1 capsule (300 mg total) by mouth 2 (two) times daily. For uti (Patient not taking: Reported on 06/29/2020) 10 capsule 0   No current facility-administered medications on file prior to visit.     Current Medications, Allergies, Past Medical History, Past Surgical History, Family History and Social History were reviewed in [employer] record.   Review of Systems:   Constitutional: Negative for fever, sweats, chills or weight loss.  Respiratory: Negative for shortness of breath.   Cardiovascular: + Chest wall pain assessed by PCP. No palpitations and leg swelling.  Gastrointestinal: See HPI.  Musculoskeletal: + Back pain.  Neurological: Negative for dizziness, headaches or paresthesias.   Physical Exam: Ht 4\' 11"  (1.499 m)    Wt 153 lb (69.4 kg)    BMI 30.90 kg/m  General: 80 year old female in no acute distress. Head: Normocephalic and atraumatic. Eyes: No scleral icterus. Conjunctiva pink . Ears: Normal auditory acuity. Mouth: Dentition intact. No ulcers or lesions.  Lungs: Clear throughout to auscultation. Heart: Heart rhythm was briefly irregular with inspiration then consistently regular rate and rhythm, no murmur. Abdomen: Soft, mild gaseous distension. Mild LUQ and LLQ tenderness without rebound or guarding. Left lower costal margin tender. No masses or hepatomegaly. Normal bowel sounds x 4 quadrants.  Rectal: Deferred.  Musculoskeletal: Symmetrical with no gross deformities. Extremities: No edema. Neurological: Alert oriented x 4. No focal deficits.  Psychological: Alert and cooperative. Normal mood and  affect  Assessment and Recommendations:  24. 80 year old female with LUQ and LLQ pain x 6 to 7 months which started after a mechanical fall with new change in bowel pattern (stools with narrow diameter) for the past few months.  -CBC, CMP and  CRP -CTAP with oral contrast only, patient reluctant to have IV contrast with past history of renal failure secondary to NSAID use in setting of CKD stage III.  -EGD and colonoscopy to be scheduled after the above lab and CTAP results reviewed  -Miralax Q HS if tolerated  -Continue Omeprazole 20mg  QD -Patient to present to the ED if severe abdominal pain occurs  -Follow up in office in 2 to 3 weeks   2. Rectal bleeding x 2 episodes one week ago without recurrence  -See plan in # 1 -Patient to call our office if rectal bleeding recurs  3. History of GERD a large 6 to 7cm hiatal hernia -See plan in # 1  4. 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 Kathyrn Sheriff on Effient and ASA -Our office will contact Dr. Kathyrn Sheriff to verify Effient instructions prior to proceeding with an EGD and colonoscopy

## 2020-06-30 NOTE — Progress Notes (Signed)
I agree with the above note, plan 

## 2020-07-03 ENCOUNTER — Ambulatory Visit (INDEPENDENT_AMBULATORY_CARE_PROVIDER_SITE_OTHER)
Admission: RE | Admit: 2020-07-03 | Discharge: 2020-07-03 | Disposition: A | Discharge status: Home / Self Care | Payer: PPO | Source: Ambulatory Visit | Attending: Nurse Practitioner | Admitting: Nurse Practitioner

## 2020-07-03 ENCOUNTER — Other Ambulatory Visit: Payer: Self-pay

## 2020-07-03 DIAGNOSIS — R1012 Left upper quadrant pain: Secondary | ICD-10-CM | POA: Diagnosis not present

## 2020-07-03 DIAGNOSIS — R1032 Left lower quadrant pain: Secondary | ICD-10-CM

## 2020-07-03 DIAGNOSIS — K589 Irritable bowel syndrome without diarrhea: Secondary | ICD-10-CM | POA: Diagnosis not present

## 2020-07-03 DIAGNOSIS — K439 Ventral hernia without obstruction or gangrene: Secondary | ICD-10-CM | POA: Diagnosis not present

## 2020-07-03 DIAGNOSIS — R194 Change in bowel habit: Secondary | ICD-10-CM | POA: Diagnosis not present

## 2020-07-03 DIAGNOSIS — K449 Diaphragmatic hernia without obstruction or gangrene: Secondary | ICD-10-CM | POA: Diagnosis not present

## 2020-07-04 ENCOUNTER — Telehealth: Payer: Self-pay | Admitting: Nurse Practitioner

## 2020-07-04 NOTE — Telephone Encounter (Signed)
Patient is requesting a call today with CT results

## 2020-07-07 NOTE — Telephone Encounter (Signed)
I called the patient and sent her a my chart msg as well. I discussed the CT results results in full detail.  I explained the CT shows she has a hiatal hernia with associated wall thickening to the proximal stomach and evidence of thickening to the sigmoid colon in the setting of diverticulosis without diverticulitis.  Refer to last office visit for further details.  EGD and colonoscopy recommended.  Our office will contact her cardiologist to verify Effient instructions prior to EGD and colonoscopy.  Also forward the CT results to Dr. Ardis Hughs and he will further review if he has any other input I will contact the patient.  This is sent to the nursing staff to contact the patient to schedule an EGD and colonoscopy.

## 2020-07-24 ENCOUNTER — Other Ambulatory Visit (HOSPITAL_BASED_OUTPATIENT_CLINIC_OR_DEPARTMENT_OTHER): Payer: PPO

## 2020-07-24 ENCOUNTER — Ambulatory Visit (HOSPITAL_BASED_OUTPATIENT_CLINIC_OR_DEPARTMENT_OTHER): Payer: PPO | Admitting: Radiology

## 2020-08-14 ENCOUNTER — Other Ambulatory Visit: Payer: Self-pay | Admitting: Neurology

## 2020-08-20 NOTE — Progress Notes (Signed)
08/20/2020 [name] [name] [id number] April 24, 1940   Chief Complaint:   Schedule an EGD and colonoscopy   History of Present Illness: [name] [name] is a 80 year old female with a past medical history of anxiety, depression, arthritis, fibromyalgia,scoliosis,basal cell cancer, kidney stones,hypertension,nonobstructivecoronary artery disease,CKD stage III, vertigo,chronicheadachesx1 1/2 years, basilar tip aneurysmon Effient and ASA s/p stent supported coil embolization of the basilar artery aneurysm 12/17/2019 by Dr. Simonne Maffucci hernia,GERD and diverticulitis. Past cholecystectomy, knee and shoulder replacement.  She presents today to schedule an EGD and colonoscopy. Refer to office consult 06/29/2020 for comprehensive history. She is s/p stent supported coil embolization of the basilar artery aneurysm 12/17/2019 by Dr. Kathyrn Sheriff on ASA and Effient. When I saw her in office on 06/29/2020, she continued to have LUQ pain which started after she fell July or August 2021. An abd/pelvic CT scan was done on 07/04/2020 which showed a moderate hiatal hernia, apparent wall thickening of the herniated stomach possibly due to under distention.  Our nursing staff attempted to contact the patient by telephone on multiple occasions and sent a letter asking the patient to contact our office to schedule an EGD and colonoscopy, however, she denied receiving these communications.  She presents to our office today to schedule the previously recommended EGD and colonoscopy.  Her symptoms have not changed.  She continues to have left upper quadrant abdominal pain x 9 months which is not associated with eating or change of position at this point.  Approximately 2 weeks ago, she reported having severe left upper quadrant abdominal pain which lasted for 2 to 3 hours then diminished to a dull low level constant pain. No nausea or vomiting.  No dysphagia or heartburn.  She continues to pass small are more narrow  brown-colored soft stools.  No recent rectal bleeding as previously noted.  She underwent a colonoscopy by Dr. Sharlett Iles 06/2008 which showed diverticulosis to the left colon otherwise was normal. No known family history of colorectal cancer.  No chest pain or shortness of breath.  She has chronic back pain secondary to scoliosis.  No other complaints at this time.   CBC Latest Ref Rng & Units 06/29/2020 12/17/2019 11/02/2019  WBC 4.0 - 10.5 K/uL 5.6 5.6 5.4  Hemoglobin 12.0 - 15.0 g/dL 12.8 12.7 13.0  Hematocrit 36.0 - 46.0 % 39.2 41.0 41.7  Platelets 150.0 - 400.0 K/uL 209.0 167 165    CMP Latest Ref Rng & Units 06/29/2020 12/17/2019 11/02/2019  Glucose 70 - 99 mg/dL 105(H) 104(H) 121(H)  BUN 6 - 23 mg/dL 13 23 21   Creatinine 0.40 - 1.20 mg/dL 0.95 1.06(H) 1.25(H)  Sodium 135 - 145 mEq/L 142 138 139  Potassium 3.5 - 5.1 mEq/L 4.1 3.8 3.8  Chloride 96 - 112 mEq/L 105 107 104  CO2 19 - 32 mEq/L 29 20(L) 24  Calcium 8.4 - 10.5 mg/dL 9.7 9.7 9.7  Total Protein 6.0 - 8.3 g/dL 7.3 - -  Total Bilirubin 0.2 - 1.2 mg/dL 0.7 - -  Alkaline Phos 39 - 117 U/L 54 - -  AST 0 - 37 U/L 15 - -  ALT 0 - 35 U/L 9 - -   CTAP 07/04/2020: 1. No acute process in the abdomen or pelvis. 2. Moderate hiatal hernia. Apparent wall thickening of the herniated stomach could be due to underdistention. Correlate with any symptoms to suggest gastritis. 3. Pelvic floor laxity. 4. Coronary artery atherosclerosis. Aortic Atherosclerosis  Esophageal manometry 07/16/2012: mild lower esophageal sphincter incompetency  EGD 08/13/2011: Large 6 to 7 cm hiatal hernia. Gastritis.   EGD 07/06/2008: Normal esophagus. The esophagus was dilated with a 41F.Maloney dilator.  3cm Hiatal hernia. Normal stomach. Normal duodenum.   Colonoscopy 06/29/2008 by Dr. Sharlett Iles: Diverticulosis descending and sigmoid colon  Current Medications, Allergies, Past Medical History, Past Surgical History, Family History and Social History were  reviewed in [facility] record.   Review of Systems:   Constitutional: Negative for fever, sweats, chills or weight loss.  Respiratory: Negative for shortness of breath.   Cardiovascular: Negative for chest pain, palpitations and leg swelling.  Gastrointestinal: See HPI.  Musculoskeletal: + lower back pain, scoliosis.  Neurological: Negative for dizziness, headaches or paresthesias.    Physical Exam: BP (!) 152/82   Pulse 76 Comment: irregular  Ht 4\' 11"  (1.499 m)   Wt 151 lb 12.8 oz (68.9 kg)   BMI 30.66 kg/m   General: 80 year old female in no acute distress. Head: Normocephalic and atraumatic. Eyes: No scleral icterus. Conjunctiva pink . Ears: Normal auditory acuity. Mouth: Dentition intact. No ulcers or lesions.  Lungs: Clear throughout to auscultation. Heart: Regular rate and rhythm, no murmur. Abdomen: Soft, nondistended. Mild LUQ tenderness. No masses or hepatomegaly. Normal bowel sounds x 4 quadrants.  Rectal: Deferred.  Musculoskeletal: Symmetrical with no gross deformities. Extremities: No edema. Neurological: Alert oriented x 4. No focal deficits.  Psychological: Alert and cooperative. Normal mood and affect  Assessment and Recommendations:  1. GERD. LUQ pain. CTAP 07/04/2020 showed moderate hiatal hernia with apparent wall thickening of the herniated stomach, possible gastritis.  -EGD benefits and risks discussed including risk with sedation, risk of bleeding, perforation and infection  -Continue Omeprazole 20 mg daily  2. Rectal bleeding.  No recent rectal bleeding. -Miralax QHS to avoid straining. Benefiber 1 tbsp daily to bulk up stools.  -Colonoscopy benefits and risks discussed including risk with sedation, risk of bleeding, perforation and infection   3. 4. 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 Kathyrn Sheriff on Effient and ASA -Our office will contact Dr.  Kathyrn Sheriff to verify Effient instructions prior to proceeding with an EGD and colonoscopy   Further follow-up to be determined after the above evaluation completed

## 2020-08-21 ENCOUNTER — Ambulatory Visit: Payer: PPO | Admitting: Nurse Practitioner

## 2020-08-21 ENCOUNTER — Telehealth: Payer: Self-pay

## 2020-08-21 ENCOUNTER — Encounter: Payer: Self-pay | Admitting: Nurse Practitioner

## 2020-08-21 VITALS — BP 152/82 | HR 76 | Ht 59.0 in | Wt 151.8 lb

## 2020-08-21 DIAGNOSIS — K625 Hemorrhage of anus and rectum: Secondary | ICD-10-CM | POA: Diagnosis not present

## 2020-08-21 DIAGNOSIS — K219 Gastro-esophageal reflux disease without esophagitis: Secondary | ICD-10-CM | POA: Diagnosis not present

## 2020-08-21 DIAGNOSIS — R1012 Left upper quadrant pain: Secondary | ICD-10-CM | POA: Diagnosis not present

## 2020-08-21 DIAGNOSIS — K5900 Constipation, unspecified: Secondary | ICD-10-CM

## 2020-08-21 MED ORDER — SUPREP BOWEL PREP KIT 17.5-3.13-1.6 GM/177ML PO SOLN: 1.0000 | ORAL | 0 refills | Status: DC

## 2020-08-21 NOTE — Patient Instructions (Addendum)
If you are age 80 or older, your body mass index should be between 23-30. Your Body mass index is 30.66 kg/m. If this is out of the aforementioned range listed, please consider follow up with your Primary Care Provider.  PROCEDURES: You have been scheduled for an endoscopy and colonoscopy. Please follow the written instructions given to you at your visit today. Please pick up your prep supplies at the pharmacy within the next 1-3 days. If you use inhalers (even only as needed), please bring them with you on the day of your procedure.  You will be contacted by our office prior to your procedure for directions on holding your Effient.  If you do not hear from our office 1 week prior to your scheduled procedure, please call [phone] to discuss.   RECOMMENDATIONS: Benefiber- 1 tablespoon daily. Miralax- Dissolve one capful in 8 ounces of water and drink before bed.  It was great seeing you today! Thank you for entrusting me with your care and choosing [facility]  Noralyn Pick, CRNP

## 2020-08-21 NOTE — Telephone Encounter (Signed)
Request for surgical clearance:     Endoscopy Procedure  What type of surgery is being performed?     Colonoscopy/EGD  When is this surgery scheduled?     09/22/20  What type of clearance is required ?   Pharmacy  Are there any medications that need to be held prior to surgery and how long? Effient 2-3 day hold  Practice name and name of physician performing surgery?      [facility]  What is your office phone and fax number?      Phone- [phone]  [phone]  Anesthesia type (None, local, MAC, general) ?       MAC

## 2020-08-21 NOTE — Progress Notes (Signed)
I agree with the above note, plan 

## 2020-08-28 ENCOUNTER — Telehealth: Payer: Self-pay | Admitting: Nurse Practitioner

## 2020-08-28 NOTE — Telephone Encounter (Signed)
Patient called states the Suprep is too expensive for her please advise.

## 2020-08-30 NOTE — Telephone Encounter (Signed)
Would Dr. Ardis Hughs be ok with a Miralax prep for her?

## 2020-08-30 NOTE — Telephone Encounter (Signed)
Dr Ardis Hughs,  Are you OK with this patient using Miralax prep?

## 2020-08-30 NOTE — Telephone Encounter (Signed)
Yes, I am okay with any of the preps as long as they are split dose.

## 2020-09-01 NOTE — Telephone Encounter (Signed)
Dr. Regis Bill,  This mutual patient is scheduled with [place] 09/22/20 for a colonoscopy and EGD. We need clearance to hold her Effient for 2-3 days before the procedure. The patient had told us that Dr. Kathyrn Sheriff manages this but the prescription we see in her chart is marked as Historical Provider and I have tried reaching out to Dr. Kathyrn Sheriff without any success. I spoke with the patient again today to make sure this was the correct person managing her blood thinner but now she is unsure who manages it.  I am hoping you can offer some info on this as I am unable to locate anything in her chart. Thank you!

## 2020-09-01 NOTE — Telephone Encounter (Signed)
Hi [name] [name],  Miss [name] [name] has not been seen by [facility] since 2017. We do not manager her Effient and will not be able to provide recommendations.  It appears it was started after hospitalization 11/2019 when she underwent "uncomplicated stent supported basilar apex aneurysm coiling". I anticipate the prescription is managed by either neurosurgery or primary care. It may be beneficial to call her pharmacy to determine when she most recently picked it up and which provider prescribed.  Hope that is helpful.   Best,  Loel Dubonnet, NP

## 2020-09-01 NOTE — Telephone Encounter (Signed)
Spoke to patient and let her know I will mail her the new instructions.

## 2020-09-06 NOTE — Telephone Encounter (Signed)
LM for patient to call back. I have been unable to obtain clearance for her Effient.   Patient states Dr. Jairo Ben managed her BT, I have not been able to reach him.  I tried contacting PCP, I have not been able to reach either.

## 2020-09-07 ENCOUNTER — Other Ambulatory Visit: Payer: Self-pay | Admitting: Internal Medicine

## 2020-09-10 ENCOUNTER — Other Ambulatory Visit: Payer: Self-pay | Admitting: Internal Medicine

## 2020-09-11 NOTE — Telephone Encounter (Signed)
Melissa, I sent Dr. Kathyrn Sheriff a staff message regarding Effient instructions prior to patient's EGD/colonoscopy. I will let you know Dr. Garald Braver response when received. Thx

## 2020-09-14 ENCOUNTER — Other Ambulatory Visit: Payer: Self-pay

## 2020-09-14 ENCOUNTER — Ambulatory Visit
Admission: RE | Admit: 2020-09-14 | Discharge: 2020-09-14 | Disposition: A | Discharge status: Home / Self Care | Payer: PPO | Source: Ambulatory Visit | Attending: Internal Medicine | Admitting: Internal Medicine

## 2020-09-14 DIAGNOSIS — M8588 Other specified disorders of bone density and structure, other site: Secondary | ICD-10-CM | POA: Diagnosis not present

## 2020-09-14 DIAGNOSIS — Z780 Asymptomatic menopausal state: Secondary | ICD-10-CM

## 2020-09-14 DIAGNOSIS — Z1231 Encounter for screening mammogram for malignant neoplasm of breast: Secondary | ICD-10-CM

## 2020-09-14 DIAGNOSIS — M810 Age-related osteoporosis without current pathological fracture: Secondary | ICD-10-CM | POA: Diagnosis not present

## 2020-09-15 NOTE — Telephone Encounter (Signed)
Request faxed to [facility].

## 2020-09-15 NOTE — Telephone Encounter (Signed)
Melissa, I did not receive a message back from Dr. Kathyrn Sheriff. Pls try again to contact him for Effient instructions. Her procedures may need to be rescheduled to a later date if Dr. Kathyrn Sheriff does not provide these instructions. Keep me updated. thx

## 2020-09-18 NOTE — Progress Notes (Deleted)
No chief complaint on file.   HPI: [name] [name] 80 y.o. comes in today for Preventive Medicare exam/ wellness visit .Since last visit.  Health Maintenance  Topic Date Due  . Hepatitis C Screening  Never done  . Zoster Vaccines- Shingrix (1 of 2) Never done  . MAMMOGRAM  03/15/2014  . TETANUS/TDAP  11/20/2017  . INFLUENZA VACCINE  11/20/2020  . DEXA SCAN  Completed  . COVID-19 Vaccine  Completed  . PNA vac Low Risk Adult  Completed  . HPV VACCINES  Aged Out   Health Maintenance Review LIFESTYLE:  Exercise:   Tobacco/ETS: Alcohol:  Sugar beverages: Sleep: Drug use: no HH:      Hearing:   Vision:  No limitations at present . Last eye check UTD  Safety:  Has smoke detector and wears seat belts.  No firearms. No excess sun exposure. Sees dentist regularly.  Falls:   Advance directive :  Reviewed  Has one.  Memory: Felt to be good  , no concern from her or her family.  Depression: No anhedonia unusual crying or depressive symptoms  Nutrition: Eats well balanced diet; adequate calcium and vitamin D. No swallowing chewing problems.  Injury: no major injuries in the last six months.  Other healthcare providers:  Reviewed today .  Social:  Lives with spouse married. No pets.   Preventive parameters: up-to-date  Reviewed   ADLS:   There are no problems or need for assistance  driving, feeding, obtaining food, dressing, toileting and bathing, managing money using phone. She is independent.  EXERCISE/ HABITS  Per week   No tobacco    etoh   ROS:  GEN/ HEENT: No fever, significant weight changes sweats headaches vision problems hearing changes, CV/ PULM; No chest pain shortness of breath cough, syncope,edema  change in exercise tolerance. GI /GU: No adominal pain, vomiting, change in bowel habits. No blood in the stool. No significant GU symptoms. SKIN/HEME: ,no acute skin rashes suspicious lesions or bleeding. No lymphadenopathy, nodules, masses.  NEURO/  PSYCH:  No neurologic signs such as weakness numbness. No depression anxiety. IMM/ Allergy: No unusual infections.  Allergy .   REST of 12 system review negative except as per HPI   Past Medical History:  Diagnosis Date  . Acute kidney failure ([place])    5 years ago  . ACUTE KIDNEY FAILURE UNSPECIFIED 07/12/2009  . Allergy   . Anemia   . Anxiety   . Arthritis   . Basal cell carcinoma   . Blood clot in vein    states it was not deep vein  . CAD (coronary artery disease)    Mild nonobstructive by CT  . Cataract   . Cerebral aneurysm without rupture 2021  . Chronic kidney disease    stones  . Depression    around the time of her husband's death  . Diverticulosis of colon (without mention of hemorrhage)   . Fibromyalgia   . GERD (gastroesophageal reflux disease)   . Headache   . Hiatal hernia   . History of kidney stones   . Hyperlipidemia   . Hypertension   . Interstitial cystitis   . Kidney stone   . Osteopenia   . PONV (postoperative nausea and vomiting)   . Scoliosis   . 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 Alzheimer's disease Mother   .  Lung cancer Father        tobacco  . Kidney cancer Son   . Stroke Son   . Arthritis Son   . Hypertension Brother        mom's side  . Osteoporosis Sister   . Hypertension Sister   . Breast cancer Cousin   . Heart disease Maternal Uncle        x 4  . Heart disease Maternal Aunt        x 4  . Diabetes Paternal Grandmother   . Diabetes Paternal Uncle        x 2  . Arthritis Daughter   . Colon cancer Neg Hx     Social History   Socioeconomic History  . Marital status: Widowed    Spouse name: Not on file  . Number of children: 2  . Years of education: Not on file  . Highest education level: Not on file  Occupational History  . Occupation: Retired   Tobacco Use  . Smoking status: Never Smoker  .  Smokeless tobacco: Never Used  Vaping Use  . Vaping Use: Never used  Substance and Sexual Activity  . Alcohol use: Not Currently    Alcohol/week: 0.0 standard drinks    Comment: rare  . Drug use: No  . Sexual activity: Not on file  Other Topics Concern  . 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   Right handed   Social Determinants of Health   Financial Resource Strain: Low Risk   . Difficulty of Paying Living Expenses: Not hard at all  Food Insecurity: No Food Insecurity  . Worried About Charity fundraiser in the Last Year: Never true  . Ran Out of Food in the Last Year: Never true  Transportation Needs: No Transportation Needs  . Lack of Transportation (Medical): No  . Lack of Transportation (Non-Medical): No  Physical Activity: Inactive  . Days of Exercise per Week: 0 days  . Minutes of Exercise per Session: 0 min  Stress: No Stress Concern Present  . Feeling of Stress : Not at all  Social Connections: Socially Isolated  . Frequency of Communication with Friends and Family: More than three times a week  . Frequency of Social Gatherings with Friends and Family: Three times a week  . Attends Religious Services: Never  . Active Member of Clubs or Organizations: No  . Attends Archivist Meetings: Never  . Marital Status: Widowed    Outpatient Encounter Medications as of 09/19/2020  Medication Sig  . amLODipine (NORVASC) 5 MG tablet Take 1 tablet (5 mg total) by mouth daily. (Patient taking differently: Take 5 mg by mouth at bedtime.)  . BAYER LOW DOSE 81 MG chewable tablet Chew 81 mg by mouth every evening.   . DULoxetine (CYMBALTA) 60 MG capsule TAKE 1 CAPSULE (60 MG TOTAL) BY MOUTH 2 (TWO) TIMES DAILY.  . melatonin 5 MG TABS Take 5 mg by mouth at bedtime.  . metoprolol tartrate (LOPRESSOR) 25 MG tablet Take 1 tablet (25 mg total) by mouth 2 (two) times daily.  Marland Kitchen omeprazole  (PRILOSEC) 20 MG capsule TAKE 1 CAPSULE BY MOUTH TWICE A DAY  . prasugrel (EFFIENT) 10 MG TABS tablet Take 5 mg by mouth every evening.  . Probiotic Product (ALIGN PO) Take 1 tablet by mouth daily.   . rosuvastatin (CRESTOR) 40 MG tablet TAKE 1  TABLET BY MOUTH EVERY DAY  . SUPREP BOWEL PREP KIT 17.5-3.13-1.6 GM/177ML SOLN Take 1 kit by mouth as directed. For colonoscopy prep   No facility-administered encounter medications on file as of 09/19/2020.    EXAM:  There were no vitals taken for this visit.  There is no height or weight on file to calculate BMI.  Physical Exam: Vital signs reviewed TMY:TRZN is a well-developed well-nourished alert cooperative   who appears stated age in no acute distress.  HEENT: normocephalic atraumatic , Eyes: PERRL EOM's full, conjunctiva clear, Nares: paten,t no deformity discharge or tenderness., Ears: no deformity EAC's clear TMs with normal landmarks. Mouthmasked  NECK: supple without masses, thyromegaly or bruits. CHEST/PULM:  Clear to auscultation and percussion breath sounds equal no wheeze , rales or rhonchi. No chest wall deformities or tenderness. CV: PMI is nondisplaced, S1 S2 no gallops, murmurs, rubs. Peripheral pulses are full without delay.No JVD .  ABDOMEN: Bowel sounds normal nontender  No guard or rebound, no hepato splenomegal no CVA tenderness.   Extremtities:  No clubbing cyanosis or edema, no acute joint swelling or redness no focal atrophy NEURO:  Oriented x3, cranial nerves 3-12 appear to be intact, no obvious focal weakness,gait within normal limits no abnormal reflexes or asymmetrical SKIN: No acute rashes normal turgor, color, no bruising or petechiae. PSYCH: Oriented, good eye contact, no obvious depression anxiety, cognition and judgment appear normal. LN: no cervical axillary inguinal adenopathy No noted deficits in memory, attention, and speech.   Lab Results  Component Value Date   WBC 5.6 06/29/2020   HGB 12.8 06/29/2020    HCT 39.2 06/29/2020   PLT 209.0 06/29/2020   GLUCOSE 105 (H) 06/29/2020   CHOL 143 09/01/2019   TRIG 69.0 09/01/2019   HDL 61.80 09/01/2019   LDLDIRECT 179.1 10/09/2007   LDLCALC 67 09/01/2019   ALT 9 06/29/2020   AST 15 06/29/2020   NA 142 06/29/2020   K 4.1 06/29/2020   CL 105 06/29/2020   CREATININE 0.95 06/29/2020   BUN 13 06/29/2020   CO2 29 06/29/2020   TSH 2.58 09/01/2019   INR 1.0 12/17/2019   HGBA1C 6.0 09/01/2019    ASSESSMENT AND PLAN:  Discussed the following assessment and plan:  Visit for preventive health examination  Hyperlipidemia, unspecified hyperlipidemia type  Stage 3a chronic kidney disease ([place])  Essential hypertension  Primary osteoarthritis of right shoulder  Patient Care Team: Keiffer Piper, Standley Brooking, MD as PCP - Jacklynn Lewis, MD as Attending Physician (Orthopedic Surgery) Suella Broad, MD (Physical Medicine and Rehabilitation) Clent Jacks, MD as Consulting Physician (Ophthalmology) [facility] (Nephrology) Audery Amel Sharma Covert, [facility] (Inactive) as Pharmacist (Pharmacist)  There are no Patient Instructions on file for this visit.  Standley Brooking. Marcheta Horsey M.D.

## 2020-09-19 ENCOUNTER — Encounter: Payer: PPO | Admitting: Internal Medicine

## 2020-09-19 ENCOUNTER — Other Ambulatory Visit: Payer: Self-pay | Admitting: Internal Medicine

## 2020-09-19 DIAGNOSIS — Z0000 Encounter for general adult medical examination without abnormal findings: Secondary | ICD-10-CM

## 2020-09-19 DIAGNOSIS — N1831 Chronic kidney disease, stage 3a: Secondary | ICD-10-CM

## 2020-09-19 DIAGNOSIS — I10 Essential (primary) hypertension: Secondary | ICD-10-CM

## 2020-09-19 DIAGNOSIS — M19011 Primary osteoarthritis, right shoulder: Secondary | ICD-10-CM

## 2020-09-19 DIAGNOSIS — E785 Hyperlipidemia, unspecified: Secondary | ICD-10-CM

## 2020-09-19 NOTE — Telephone Encounter (Signed)
Dr. Ardis Hughs, pls review msgs below. Multiple attempts were made to contact Dr. Kathyrn Sheriff who prescribes her Effient without successfully reaching him. I also sent him a staff message without a reply. Shall we cancel her EGD/colonoscopy with you and reschedule to a later date? She will likely need to schedule an office appointment with Dr. Kathyrn Sheriff to verify Effient instructions. Typically Effient is held for 5 to 7 days prior to procedure. What are your recommendations?

## 2020-09-19 NOTE — Telephone Encounter (Signed)
Yes the Effient is managed by neurosurgery. Patient canceled her appointment today with me for physical CPX.

## 2020-09-19 NOTE — Telephone Encounter (Signed)
[name] [name], Still no answer from Neuro regarding holding her BT. At this point since her procedure is in 3 days I think we will need to call her and cancel the procedure until she can get in contact with Dr. Kathyrn Sheriff herself.  Please advise.

## 2020-09-19 NOTE — Telephone Encounter (Signed)
Spoke to patient, she states she has never seen Dr. Kathyrn Sheriff other than in the hospital. He RX's her the blood thinner with 5 refills. Patient states she was told to take it until "they" tell her stop. Patient just as confused as we are and I did tell her we will most likely need to cancel her procedure until we can get a hold of someone to address holding the Effient.

## 2020-09-20 NOTE — Telephone Encounter (Signed)
Melissa, pls cancel patient's procedures. Pls call her and let her know procedures canceled and she will need to make an appt with Dr. Kathyrn Sheriff to verify Effient instructions prior to rescheduling her procedures. Thx

## 2020-09-20 NOTE — Telephone Encounter (Signed)
Procedure canceled. Will contact patient to let her know she does need appt with who manages her BT

## 2020-09-20 NOTE — Telephone Encounter (Signed)
Called patient and left her a VM that the procedures have been canceled at this time and she will need to contact Dr. Kathyrn Sheriff office to speak with them regarding holding her medication.  I also left another VM [phone]) for his medical assistant to return my call.

## 2020-09-20 NOTE — Telephone Encounter (Signed)
Please cancel her upcoming procedures for this Friday, June 3.  She needs appointment with whichever doctor is in charge of this medicine for her.  It seems like it might be [facility].  It is not safe to proceed without their advice and it is now "too late" to start holding the medicine

## 2020-09-22 ENCOUNTER — Ambulatory Visit: Payer: PPO

## 2020-09-22 ENCOUNTER — Encounter: Payer: PPO | Admitting: Gastroenterology

## 2020-09-22 NOTE — Telephone Encounter (Signed)
Dr. Cleotilde Neer CMA called me back and they are declining to stop her blood thinner until they see her for a follow up.  I called the patient and left a detailed VM that she MUST call his office and schedule a follow up.

## 2020-09-23 ENCOUNTER — Other Ambulatory Visit: Payer: Self-pay | Admitting: Internal Medicine

## 2020-10-04 ENCOUNTER — Other Ambulatory Visit: Payer: Self-pay | Admitting: Internal Medicine

## 2020-10-09 ENCOUNTER — Other Ambulatory Visit: Payer: Self-pay | Admitting: Internal Medicine

## 2020-10-09 DIAGNOSIS — I671 Cerebral aneurysm, nonruptured: Secondary | ICD-10-CM | POA: Diagnosis not present

## 2020-10-10 ENCOUNTER — Other Ambulatory Visit: Payer: Self-pay | Admitting: Neurosurgery

## 2020-10-10 DIAGNOSIS — I671 Cerebral aneurysm, nonruptured: Secondary | ICD-10-CM

## 2020-10-11 ENCOUNTER — Other Ambulatory Visit: Payer: Self-pay | Admitting: Neurosurgery

## 2020-10-20 ENCOUNTER — Ambulatory Visit: Payer: PPO

## 2020-10-31 ENCOUNTER — Ambulatory Visit (HOSPITAL_COMMUNITY)
Admission: RE | Admit: 2020-10-31 | Discharge: 2020-10-31 | Disposition: A | Discharge status: Home / Self Care | Payer: PPO | Source: Ambulatory Visit | Attending: Neurosurgery | Admitting: Neurosurgery

## 2020-10-31 ENCOUNTER — Encounter (HOSPITAL_COMMUNITY): Payer: Self-pay

## 2020-10-31 ENCOUNTER — Other Ambulatory Visit: Payer: Self-pay | Admitting: Neurosurgery

## 2020-10-31 ENCOUNTER — Other Ambulatory Visit: Payer: Self-pay

## 2020-10-31 DIAGNOSIS — I671 Cerebral aneurysm, nonruptured: Secondary | ICD-10-CM

## 2020-10-31 DIAGNOSIS — Z48812 Encounter for surgical aftercare following surgery on the circulatory system: Secondary | ICD-10-CM | POA: Diagnosis not present

## 2020-10-31 DIAGNOSIS — Z882 Allergy status to sulfonamides status: Secondary | ICD-10-CM | POA: Insufficient documentation

## 2020-10-31 DIAGNOSIS — Z79899 Other long term (current) drug therapy: Secondary | ICD-10-CM | POA: Insufficient documentation

## 2020-10-31 DIAGNOSIS — Z888 Allergy status to other drugs, medicaments and biological substances status: Secondary | ICD-10-CM | POA: Insufficient documentation

## 2020-10-31 DIAGNOSIS — Z7982 Long term (current) use of aspirin: Secondary | ICD-10-CM | POA: Diagnosis not present

## 2020-10-31 DIAGNOSIS — Z886 Allergy status to analgesic agent status: Secondary | ICD-10-CM | POA: Insufficient documentation

## 2020-10-31 HISTORY — PX: IR ANGIO VERTEBRAL SEL VERTEBRAL UNI L MOD SED: IMG5367

## 2020-10-31 HISTORY — PX: IR ANGIO INTRA EXTRACRAN SEL INTERNAL CAROTID BILAT MOD SED: IMG5363

## 2020-10-31 LAB — CBC WITH DIFFERENTIAL/PLATELET
Abs Immature Granulocytes: 0.01 10*3/uL (ref 0.00–0.07)
Basophils Absolute: 0 10*3/uL (ref 0.0–0.1)
Basophils Relative: 1 %
Eosinophils Absolute: 0.1 10*3/uL (ref 0.0–0.5)
Eosinophils Relative: 3 %
HCT: 37.6 % (ref 36.0–46.0)
Hemoglobin: 12.1 g/dL (ref 12.0–15.0)
Immature Granulocytes: 0 %
Lymphocytes Relative: 23 %
Lymphs Abs: 0.9 10*3/uL (ref 0.7–4.0)
MCH: 28.4 pg (ref 26.0–34.0)
MCHC: 32.2 g/dL (ref 30.0–36.0)
MCV: 88.3 fL (ref 80.0–100.0)
Monocytes Absolute: 0.3 10*3/uL (ref 0.1–1.0)
Monocytes Relative: 9 %
Neutro Abs: 2.4 10*3/uL (ref 1.7–7.7)
Neutrophils Relative %: 64 %
Platelets: 141 10*3/uL — ABNORMAL LOW (ref 150–400)
RBC: 4.26 MIL/uL (ref 3.87–5.11)
RDW: 14 % (ref 11.5–15.5)
WBC: 3.8 10*3/uL — ABNORMAL LOW (ref 4.0–10.5)
nRBC: 0 % (ref 0.0–0.2)

## 2020-10-31 LAB — BASIC METABOLIC PANEL
Anion gap: 8 (ref 5–15)
BUN: 18 mg/dL (ref 8–23)
CO2: 26 mmol/L (ref 22–32)
Calcium: 9.5 mg/dL (ref 8.9–10.3)
Chloride: 104 mmol/L (ref 98–111)
Creatinine, Ser: 1.15 mg/dL — ABNORMAL HIGH (ref 0.44–1.00)
GFR, Estimated: 48 mL/min — ABNORMAL LOW (ref >60)
Glucose, Bld: 114 mg/dL — ABNORMAL HIGH (ref 70–99)
Potassium: 3.5 mmol/L (ref 3.5–5.1)
Sodium: 138 mmol/L (ref 135–145)

## 2020-10-31 LAB — PROTIME-INR
INR: 1 (ref 0.8–1.2)
Prothrombin Time: 12.8 seconds (ref 11.4–15.2)

## 2020-10-31 MED ORDER — HYDROCODONE-ACETAMINOPHEN 5-325 MG PO TABS: 1.0000 | ORAL_TABLET | ORAL | Status: DC | PRN

## 2020-10-31 MED ORDER — FENTANYL CITRATE (PF) 100 MCG/2ML IJ SOLN
INTRAMUSCULAR | Status: AC | PRN
  Administered 2020-10-31: 25 ug via INTRAVENOUS

## 2020-10-31 MED ORDER — IOHEXOL 300 MG/ML  SOLN
100.0000 mL | Freq: Once | INTRAMUSCULAR | Status: AC | PRN
  Administered 2020-10-31: 55 mL

## 2020-10-31 MED ORDER — MIDAZOLAM HCL 2 MG/2ML IJ SOLN
INTRAMUSCULAR | Status: AC
  Filled 2020-10-31: qty 2

## 2020-10-31 MED ORDER — LIDOCAINE HCL 1 % IJ SOLN
INTRAMUSCULAR | Status: AC | PRN
  Administered 2020-10-31: 10 mL

## 2020-10-31 MED ORDER — HEPARIN SODIUM (PORCINE) 1000 UNIT/ML IJ SOLN
INTRAMUSCULAR | Status: AC | PRN
  Administered 2020-10-31: 2000 [IU] via INTRAVENOUS

## 2020-10-31 MED ORDER — SODIUM CHLORIDE 0.9 % IV SOLN: INTRAVENOUS | Status: DC

## 2020-10-31 MED ORDER — HYDRALAZINE HCL 20 MG/ML IJ SOLN
INTRAMUSCULAR | Status: AC | PRN
  Administered 2020-10-31: 5 mg via INTRAVENOUS

## 2020-10-31 MED ORDER — HYDRALAZINE HCL 20 MG/ML IJ SOLN
INTRAMUSCULAR | Status: AC
  Filled 2020-10-31: qty 1

## 2020-10-31 MED ORDER — HEPARIN SODIUM (PORCINE) 1000 UNIT/ML IJ SOLN
INTRAMUSCULAR | Status: AC
  Filled 2020-10-31: qty 1

## 2020-10-31 MED ORDER — LIDOCAINE HCL 1 % IJ SOLN
INTRAMUSCULAR | Status: AC
  Filled 2020-10-31: qty 20

## 2020-10-31 MED ORDER — FENTANYL CITRATE (PF) 100 MCG/2ML IJ SOLN
INTRAMUSCULAR | Status: AC
  Filled 2020-10-31: qty 2

## 2020-10-31 MED ORDER — MIDAZOLAM HCL 2 MG/2ML IJ SOLN
INTRAMUSCULAR | Status: AC | PRN
  Administered 2020-10-31: 1 mg via INTRAVENOUS

## 2020-10-31 NOTE — Brief Op Note (Signed)
  NEUROSURGERY BRIEF OPERATIVE  NOTE   PREOP DX: Basilar aneurysm, Acom aneurysm  POSTOP DX: Same  PROCEDURE: Diagnostic cerebral angiogram  SURGEON: Dr. Consuella Lose, MD  ANESTHESIA: IV Sedation with Local  EBL: Minimal  SPECIMENS: None  COMPLICATIONS: None  CONDITION: Stable to recovery  FINDINGS (Full report in [facility]): 1. 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 No in-stent stenosis 2. 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

## 2020-10-31 NOTE — Sedation Documentation (Signed)
5 Fr Exoseal deployed

## 2020-10-31 NOTE — H&P (Signed)
Chief Complaint   Aneurysm  History of Present Illness  [name] [name] is a 80 y.o. female with a history of incidentally discovered large basilar apex aneurysm.  Patient underwent stent supported coil embolization of the aneurysm approximately 11 months ago.  She has been maintained on Effient and aspirin.  She presents today for routine follow-up angiography.  Past Medical History   Past Medical History:  Diagnosis Date   Acute kidney failure ([place])    5 years ago   ACUTE KIDNEY FAILURE UNSPECIFIED 07/12/2009   Allergy    Anemia    Anxiety    Arthritis    Basal cell carcinoma    Blood clot in vein    states it was not deep vein   CAD (coronary artery disease)    Mild nonobstructive by CT   Cataract    Cerebral aneurysm without rupture 2021   Chronic kidney disease    stones   Depression    around the time of her husband's death   Diverticulosis of colon (without mention of hemorrhage)    Fibromyalgia    GERD (gastroesophageal reflux disease)    Headache    Hiatal hernia    History of kidney stones    Hyperlipidemia    Hypertension    Interstitial cystitis    Kidney stone    Osteopenia    PONV (postoperative nausea and vomiting)    Scoliosis    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   Qualifier: Diagnosis of  By: Sharlett Iles MD [name]    Vertigo     Past Surgical History   Past Surgical History:  Procedure Laterality Date   BLADDER TUMOR EXCISION  2001   CATARACT EXTRACTION     bilateral   CHOLECYSTECTOMY     CYSTECTOMY  2001   benign   ESOPHAGEAL MANOMETRY N/A 07/13/2012   Procedure: ESOPHAGEAL MANOMETRY (EM);  Surgeon: Sable Feil, MD;  Location: [facility];  Service: Endoscopy;  Laterality: N/A;   HIATAL HERNIA REPAIR     IR ANGIO INTRA EXTRACRAN SEL INTERNAL CAROTID BILAT MOD SED  11/02/2019   IR ANGIO VERTEBRAL SEL VERTEBRAL BILAT MOD SED  12/17/2019   IR ANGIO VERTEBRAL SEL VERTEBRAL UNI R MOD SED  11/02/2019   IR ANGIOGRAM FOLLOW UP STUDY   12/17/2019   IR ANGIOGRAM FOLLOW UP STUDY  12/17/2019   IR ANGIOGRAM FOLLOW UP STUDY  12/17/2019   IR ANGIOGRAM FOLLOW UP STUDY  12/17/2019   IR NEURO EACH ADD'L AFTER BASIC UNI LEFT (MS)  12/17/2019   IR TRANSCATH/EMBOLIZ  12/17/2019   LUMBAR DISC SURGERY     x 2 elsner   RADIOLOGY WITH ANESTHESIA N/A 12/17/2019   Procedure: 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;  Surgeon: Consuella Lose, MD;  Location: [place];  Service: Radiology;  Laterality: N/A;   REVERSE SHOULDER ARTHROPLASTY Right 08/12/2014   Procedure: RIGHT REVERSE TOTAL SHOULDER ARTHROPLASTY;  Surgeon: Netta Cedars, MD;  Location: [place];  Service: Orthopedics;  Laterality: Right;   TENDON REPAIR Right 11/2005   right shoulder   TOTAL KNEE ARTHROPLASTY Left    left   TOTAL KNEE ARTHROPLASTY Right 11/17/2015   Procedure: RIGHT TOTAL KNEE ARTHROPLASTY;  Surgeon: Sydnee Cabal, MD;  Location: [facility];  Service: Orthopedics;  Laterality: Right;    Social History   Social History   Tobacco Use   Smoking status: Never   Smokeless tobacco: Never  Vaping Use   Vaping Use: Never used  Substance Use Topics  Alcohol use: Not Currently    Alcohol/week: 0.0 standard drinks    Comment: rare   Drug use: No    Medications   Prior to Admission medications   Medication Sig Start Date End Date Taking? Authorizing Provider  amLODipine (NORVASC) 5 MG tablet TAKE 1 TABLET BY MOUTH EVERY DAY 09/19/20  Yes Panosh, Standley Brooking, MD  BAYER LOW DOSE 81 MG chewable tablet Chew 81 mg by mouth every evening.  12/09/19  Yes [provider]  DULoxetine (CYMBALTA) 60 MG capsule TAKE 1 CAPSULE (60 MG TOTAL) BY MOUTH 2 (TWO) TIMES DAILY. 09/11/20  Yes Panosh, Standley Brooking, MD  melatonin 5 MG TABS Take 5 mg by mouth at bedtime.   Yes [provider]  metoprolol tartrate (LOPRESSOR) 25 MG tablet TAKE 1 TABLET BY MOUTH TWICE A DAY 09/25/20  Yes Panosh, Standley Brooking, MD  omeprazole (PRILOSEC) 20 MG capsule TAKE 1 CAPSULE BY MOUTH  TWICE A DAY 09/07/20  Yes Panosh, Standley Brooking, MD  prasugrel (EFFIENT) 10 MG TABS tablet Take 5 mg by mouth every evening.   Yes [provider]  Probiotic Product (ALIGN PO) Take 1 tablet by mouth daily.    Yes [provider]  rosuvastatin (CRESTOR) 40 MG tablet TAKE 1 TABLET BY MOUTH EVERY DAY 09/11/20  Yes Panosh, Standley Brooking, MD  SUPREP BOWEL PREP KIT 17.5-3.13-1.6 GM/177ML SOLN Take 1 kit by mouth as directed. For colonoscopy prep 08/21/20   Noralyn Pick, NP    Allergies   Allergies  Allergen Reactions   Gadolinium Derivatives Hives    PER DR Nelia Shi PT NEEDS 13HOUR PREP BEFORE FUTURE CONTRASTED MRI'S//KMS   Aspirin Other (See Comments)    Acute renal failure    Lactose Intolerance (Gi) Other (See Comments)    Gi upset   Meloxicam     REACTION: acute renal failure  with hypovolemic trigger Hospital  3 2011 Pt states allergic to all NSAIDS    Metoprolol    Sulfamethoxazole Hives   Tylenol [Acetaminophen] Other (See Comments)    Acute renal failure     Review of Systems  ROS  Neurologic Exam  Awake, alert, oriented Memory and concentration grossly intact Speech fluent, appropriate CN grossly intact Motor exam: Upper Extremities Deltoid Bicep Tricep Grip  Right 5/5 5/5 5/5 5/5  Left 5/5 5/5 5/5 5/5   Lower Extremities IP Quad PF DF EHL  Right 5/5 5/5 5/5 5/5 5/5  Left 5/5 5/5 5/5 5/5 5/5   Sensation grossly intact to LT  Impression  - 80 y.o. female nearly 1 year status post stent supported coil embolization of a large incidentally discovered basilar apex aneurysm.  She is neurologically well and remains on DAPT.  Plan  -We will plan on proceeding with routine follow-up diagnostic cerebral angiogram  I have reviewed the indications for the procedure as well as the expected postoperative course and recovery.  We have also discussed the inherent risks, benefits, and alternatives to the procedure.  All her questions have been answered.  She provided  informed consent to proceed.  Consuella Lose, MD [facility]

## 2020-10-31 NOTE — Sedation Documentation (Signed)
Patient transported to short stay. Anisha RN at the bedside to receive patient. Groin site assessed. Clean, dry and intact. No hematoma noted upon palpation. No drainage from dressing. Palpable distal pulses intact.

## 2020-11-03 ENCOUNTER — Other Ambulatory Visit: Payer: Self-pay | Admitting: Internal Medicine

## 2020-11-13 DIAGNOSIS — Z6829 Body mass index (BMI) 29.0-29.9, adult: Secondary | ICD-10-CM | POA: Diagnosis not present

## 2020-11-13 DIAGNOSIS — I10 Essential (primary) hypertension: Secondary | ICD-10-CM | POA: Diagnosis not present

## 2020-11-13 DIAGNOSIS — I671 Cerebral aneurysm, nonruptured: Secondary | ICD-10-CM | POA: Diagnosis not present

## 2020-11-20 ENCOUNTER — Other Ambulatory Visit: Payer: Self-pay | Admitting: Internal Medicine

## 2020-12-18 ENCOUNTER — Other Ambulatory Visit: Payer: Self-pay | Admitting: Internal Medicine

## 2020-12-21 ENCOUNTER — Other Ambulatory Visit: Payer: Self-pay | Admitting: Internal Medicine

## 2020-12-29 ENCOUNTER — Other Ambulatory Visit: Payer: Self-pay | Admitting: Internal Medicine

## 2020-12-30 NOTE — Progress Notes (Signed)
Have not had  the findings of osteoporosis  discussed  in a visit ( having had many other medical issues that needed to be addressed. ) at  her convenience have her make appt to discuss osteoporosis with me . Virtual ok  thanks

## 2021-01-02 ENCOUNTER — Telehealth: Payer: Self-pay

## 2021-01-02 NOTE — Telephone Encounter (Signed)
Patient called to inform Dr. Regis Bill that she will be scheduling her Colonostomy  soon.  Pt stated that if there is an office visit appt that needs to be made to call her.

## 2021-01-19 ENCOUNTER — Other Ambulatory Visit: Payer: Self-pay | Admitting: Internal Medicine

## 2021-01-28 ENCOUNTER — Other Ambulatory Visit: Payer: Self-pay | Admitting: Internal Medicine

## 2021-01-31 ENCOUNTER — Other Ambulatory Visit (HOSPITAL_BASED_OUTPATIENT_CLINIC_OR_DEPARTMENT_OTHER): Payer: Self-pay | Admitting: Internal Medicine

## 2021-01-31 DIAGNOSIS — Z1231 Encounter for screening mammogram for malignant neoplasm of breast: Secondary | ICD-10-CM

## 2021-02-11 NOTE — Telephone Encounter (Signed)
Have her make a yearly physical check  in January  or earlier if needed .

## 2021-02-12 NOTE — Telephone Encounter (Signed)
Left a message for the pt to return my call.  

## 2021-02-15 NOTE — Telephone Encounter (Signed)
Left a message for the pt to return my call.  

## 2021-02-20 NOTE — Telephone Encounter (Signed)
Pt informed of the message below. OV scheduled for 03/30/2021

## 2021-02-22 ENCOUNTER — Other Ambulatory Visit: Payer: Self-pay | Admitting: Internal Medicine

## 2021-04-09 ENCOUNTER — Ambulatory Visit: Payer: PPO | Admitting: Internal Medicine

## 2021-04-29 ENCOUNTER — Other Ambulatory Visit: Payer: Self-pay | Admitting: Internal Medicine

## 2021-05-03 ENCOUNTER — Other Ambulatory Visit: Payer: Self-pay | Admitting: Internal Medicine

## 2021-05-03 ENCOUNTER — Telehealth: Payer: Self-pay

## 2021-05-03 NOTE — Telephone Encounter (Signed)
---  Caller states about an hour and a half ago it felt like a big belch and yellow stuff came up in her mouth that tasted bad. She spit it out. She has not eaten today. No vomiting and diarrhea.  05/01/2021 5:31:39 PM [place], RN, Marjory Lies

## 2021-05-25 ENCOUNTER — Telehealth: Payer: Self-pay | Admitting: Internal Medicine

## 2021-05-25 NOTE — Telephone Encounter (Signed)
Left message for patient to call back and schedule Medicare Annual Wellness Visit (AWV) either virtually or in office. Left  my [name] [name] number [phone]   Last AWV 06/19/20  please schedule at anytime with [facility] Nurse Health Advisor 1 or 2   This should be a 45 minute visit.

## 2021-05-26 ENCOUNTER — Emergency Department (HOSPITAL_BASED_OUTPATIENT_CLINIC_OR_DEPARTMENT_OTHER)
Admission: EM | Admit: 2021-05-26 | Discharge: 2021-05-26 | Disposition: A | Discharge status: Home / Self Care | Payer: PPO | Attending: Emergency Medicine | Admitting: Emergency Medicine

## 2021-05-26 ENCOUNTER — Other Ambulatory Visit: Payer: Self-pay

## 2021-05-26 ENCOUNTER — Encounter (HOSPITAL_BASED_OUTPATIENT_CLINIC_OR_DEPARTMENT_OTHER): Payer: Self-pay | Admitting: Emergency Medicine

## 2021-05-26 DIAGNOSIS — R319 Hematuria, unspecified: Secondary | ICD-10-CM | POA: Insufficient documentation

## 2021-05-26 LAB — CBC
HCT: 41.7 % (ref 36.0–46.0)
Hemoglobin: 13.4 g/dL (ref 12.0–15.0)
MCH: 28.2 pg (ref 26.0–34.0)
MCHC: 32.1 g/dL (ref 30.0–36.0)
MCV: 87.6 fL (ref 80.0–100.0)
Platelets: 170 10*3/uL (ref 150–400)
RBC: 4.76 MIL/uL (ref 3.87–5.11)
RDW: 14.8 % (ref 11.5–15.5)
WBC: 4 10*3/uL (ref 4.0–10.5)
nRBC: 0 % (ref 0.0–0.2)

## 2021-05-26 LAB — BASIC METABOLIC PANEL
Anion gap: 6 (ref 5–15)
BUN: 19 mg/dL (ref 8–23)
CO2: 27 mmol/L (ref 22–32)
Calcium: 9.4 mg/dL (ref 8.9–10.3)
Chloride: 105 mmol/L (ref 98–111)
Creatinine, Ser: 1.05 mg/dL — ABNORMAL HIGH (ref 0.44–1.00)
GFR, Estimated: 54 mL/min — ABNORMAL LOW (ref >60)
Glucose, Bld: 111 mg/dL — ABNORMAL HIGH (ref 70–99)
Potassium: 4 mmol/L (ref 3.5–5.1)
Sodium: 138 mmol/L (ref 135–145)

## 2021-05-26 LAB — URINALYSIS, ROUTINE W REFLEX MICROSCOPIC
Bilirubin Urine: NEGATIVE
Glucose, UA: NEGATIVE mg/dL
Hgb urine dipstick: NEGATIVE
Ketones, ur: NEGATIVE mg/dL
Nitrite: NEGATIVE
Protein, ur: 30 mg/dL — AB
Specific Gravity, Urine: 1.015 (ref 1.005–1.030)
pH: 6.5 (ref 5.0–8.0)

## 2021-05-26 NOTE — ED Triage Notes (Signed)
Pt noticed blood in her urine today. Also reports not feeling well the last few days, including left flank pain.

## 2021-05-26 NOTE — ED Provider Notes (Signed)
[facility] EMERGENCY DEPT Provider Note   CSN: [id number] Arrival date & time: 05/26/21  1749     History  Chief Complaint  Patient presents with   Hematuria    [name] [name] is a 81 y.o. female.   Hematuria Pertinent negatives include no abdominal pain and no shortness of breath. Patient presents with blood in the urine.  States she had a single episode of it today.  States has been feeling a little off for the last couple weeks however.  No fevers or chills.  No coughing.  No abdominal pain.  No flank pain.  No other bleeding.  States she has urinated since the episode and no more blood.     Home Medications Prior to Admission medications   Medication Sig Start Date End Date Taking? Authorizing Provider  amLODipine (NORVASC) 5 MG tablet TAKE 1 TABLET BY MOUTH EVERY DAY 02/22/21   Panosh, Standley Brooking, MD  BAYER LOW DOSE 81 MG chewable tablet Chew 81 mg by mouth every evening.  12/09/19   [provider]  DULoxetine (CYMBALTA) 60 MG capsule TAKE 1 CAPSULE BY MOUTH TWICE A DAY 05/09/21   Panosh, Standley Brooking, MD  melatonin 5 MG TABS Take 5 mg by mouth at bedtime.    [provider]  metoprolol tartrate (LOPRESSOR) 25 MG tablet TAKE 1 TABLET BY MOUTH TWICE A DAY 02/22/21   Panosh, Standley Brooking, MD  omeprazole (PRILOSEC) 20 MG capsule TAKE 1 CAPSULE BY MOUTH TWICE A DAY 05/09/21   Panosh, Standley Brooking, MD  Probiotic Product (ALIGN PO) Take 1 tablet by mouth daily.     [provider]  rosuvastatin (CRESTOR) 40 MG tablet TAKE 1 TABLET BY MOUTH EVERY DAY 02/22/21   Panosh, Standley Brooking, MD  SUPREP BOWEL PREP KIT 17.5-3.13-1.6 GM/177ML SOLN Take 1 kit by mouth as directed. For colonoscopy prep 08/21/20   Noralyn Pick, NP      Allergies    Gadolinium derivatives, Aspirin, Lactose intolerance (gi), Meloxicam, Metoprolol, Sulfamethoxazole, and Tylenol [acetaminophen]    Review of Systems   Review of Systems  Constitutional:  Negative for appetite change.   Respiratory:  Negative for shortness of breath.   Gastrointestinal:  Negative for abdominal pain.  Genitourinary:  Positive for hematuria.  Musculoskeletal:  Negative for back pain.  Psychiatric/Behavioral:  Negative for confusion.    Physical Exam Updated Vital Signs BP (!) 151/73 (BP Location: Right Arm)    Pulse (!) 47    Temp (!) 97.5 F (36.4 C)    Resp 16    Ht 5' (1.524 m)    SpO2 100%    BMI 29.49 kg/m  Physical Exam Vitals and nursing note reviewed.  HENT:     Head: Atraumatic.  Cardiovascular:     Rate and Rhythm: Regular rhythm.  Pulmonary:     Breath sounds: No wheezing or rhonchi.  Abdominal:     Tenderness: There is no abdominal tenderness.  Musculoskeletal:        General: No tenderness.  Skin:    General: Skin is warm.  Neurological:     Mental Status: She is alert and oriented to person, place, and time.    ED Results / Procedures / Treatments   Labs (all labs ordered are listed, but only abnormal results are displayed) Labs Reviewed  URINALYSIS, ROUTINE W REFLEX MICROSCOPIC - Abnormal; Notable for the following components:      Result Value   APPearance HAZY (*)  ur 30 (*)   ° Leukocytes,Ua SMALL (*)   ° All other components within normal limits  °BASIC METABOLIC PANEL - Abnormal; Notable for the following components:  ° Glucose, Bld 111 (*)   ° Creatinine, Ser 1.05 (*)   ° GFR, Estimated 54 (*)   ° All other components within normal limits  °CBC  ° ° °EKG °None ° °Radiology °No results found. ° °Procedures °Procedures  ° ° °Medications Ordered in ED °Medications - No data to display ° °ED Course/ Medical Decision Making/ A&P °  °                        °Medical Decision Making °Amount and/or Complexity of Data Reviewed °Labs: ordered. ° ° °Patient presented with hematuria.  Initial differential diagnosis includes infection, kidney stones, other hematuria.  No abdominal pain.  No other bleeding.  Only on aspirin.  Has urinated since with no hematuria  or infection.  No flank pain.  Doubt kidney stone.  Reviewed previous CT scan also did not show stone.  Reviewed past PCP notes also.  Patient also feeling little bad all over.  Do not feels we need further work-up for it at this time.  Has short-term follow-up with PCP for wellness visit.  Can follow-up with PCP.  Not on blood thinners.  Discharge home. ° ° ° ° ° ° ° °Final Clinical Impression(s) / ED Diagnoses °Final diagnoses:  °Hematuria, unspecified type  ° ° °Rx / DC Orders °ED Discharge Orders   ° ° None  ° °  ° ° °  °, , MD °05/26/21 2015 ° °

## 2021-05-29 ENCOUNTER — Ambulatory Visit: Payer: PPO | Admitting: Internal Medicine

## 2021-05-29 NOTE — Progress Notes (Deleted)
No chief complaint on file.   HPI: [name] [name] 81 y.o. come in for Fu  ed uc for blood in urine that resolved   on evaluation see 2/4 note  ROS: See pertinent positives and negatives per HPI.  Past Medical History:  Diagnosis Date   Acute kidney failure ([place])    5 years ago   ACUTE KIDNEY FAILURE UNSPECIFIED 07/12/2009   Allergy    Anemia    Anxiety    Arthritis    Basal cell carcinoma    Blood clot in vein    states it was not deep vein   CAD (coronary artery disease)    Mild nonobstructive by CT   Cataract    Cerebral aneurysm without rupture 2021   Chronic kidney disease    stones   Depression    around the time of her husband's death   Diverticulosis of colon (without mention of hemorrhage)    Fibromyalgia    GERD (gastroesophageal reflux disease)    Headache    Hiatal hernia    History of kidney stones    Hyperlipidemia    Hypertension    Interstitial cystitis    Kidney stone    Osteopenia    PONV (postoperative nausea and vomiting)    Scoliosis    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   Qualifier: Diagnosis of  By: Sharlett Iles MD [name]    Vertigo     Family History  Problem Relation Age of Onset   Alzheimer's disease Mother    Lung cancer Father        tobacco   Kidney cancer Son    Stroke Son    Arthritis Son    Hypertension Brother        mom's side   Osteoporosis Sister    Hypertension Sister    Breast cancer Cousin    Heart disease Maternal Uncle        x 4   Heart disease Maternal Aunt        x 4   Diabetes Paternal Grandmother    Diabetes Paternal Uncle        x 2   Arthritis Daughter    Colon cancer Neg Hx     Social History   Socioeconomic History   Marital status: Widowed    Spouse name: Not on file   Number of children: 2   Years of education: Not on file   Highest education level: Not on file  Occupational History   Occupation: Retired   Tobacco Use   Smoking status: Never   Smokeless tobacco: Never  Vaping Use    Vaping Use: Never used  Substance and Sexual Activity   Alcohol use: Not Currently    Alcohol/week: 0.0 standard drinks    Comment: rare   Drug use: No   Sexual activity: Not on file  Other Topics Concern   Not on file  Social History Narrative   Widowed 10/09 husband [place] retired   Daily caffeine use.   Right handed   Social Determinants of Health   Financial Resource Strain: Low Risk    Difficulty of Paying Living Expenses: Not hard at all  Food Insecurity: No Food Insecurity   Worried About Charity fundraiser in the Last Year: Never true   [place] in the Last Year: Never true  Transportation Needs: No Transportation Needs   Lack of Transportation (Medical): No   Lack of  Transportation (Non-Medical): No  Physical Activity: Inactive   Days of Exercise per Week: 0 days   Minutes of Exercise per Session: 0 min  Stress: No Stress Concern Present   Feeling of Stress : Not at all  Social Connections: Socially Isolated   Frequency of Communication with Friends and Family: More than three times a week   Frequency of Social Gatherings with Friends and Family: Three times a week   Attends Religious Services: Never   Active Member of Clubs or Organizations: No   Attends Archivist Meetings: Never   Marital Status: Widowed    Outpatient Medications Prior to Visit  Medication Sig Dispense Refill   amLODipine (NORVASC) 5 MG tablet TAKE 1 TABLET BY MOUTH EVERY DAY 90 tablet 0   BAYER LOW DOSE 81 MG chewable tablet Chew 81 mg by mouth every evening.      DULoxetine (CYMBALTA) 60 MG capsule TAKE 1 CAPSULE BY MOUTH TWICE A DAY 180 capsule 1   melatonin 5 MG TABS Take 5 mg by mouth at bedtime.     metoprolol tartrate (LOPRESSOR) 25 MG tablet TAKE 1 TABLET BY MOUTH TWICE A DAY 180 tablet 0   omeprazole (PRILOSEC) 20 MG capsule TAKE 1 CAPSULE BY MOUTH TWICE A DAY 180 capsule 0   Probiotic Product (ALIGN PO) Take 1 tablet by mouth daily.       rosuvastatin (CRESTOR) 40 MG tablet TAKE 1 TABLET BY MOUTH EVERY DAY 90 tablet 0   SUPREP BOWEL PREP KIT 17.5-3.13-1.6 GM/177ML SOLN Take 1 kit by mouth as directed. For colonoscopy prep 354 mL 0   No facility-administered medications prior to visit.     EXAM:  There were no vitals taken for this visit.  There is no height or weight on file to calculate BMI.  GENERAL: vitals reviewed and listed above, alert, oriented, appears well hydrated and in no acute distress HEENT: atraumatic, conjunctiva  clear, no obvious abnormalities on inspection of external nose and ears OP : no lesion edema or exudate  NECK: no obvious masses on inspection palpation  LUNGS: clear to auscultation bilaterally, no wheezes, rales or rhonchi, good air movement CV: HRRR, no clubbing cyanosis or  peripheral edema nl cap refill  MS: moves all extremities without noticeable focal  abnormality PSYCH: pleasant and cooperative, no obvious depression or anxiety Lab Results  Component Value Date   WBC 4.0 05/26/2021   HGB 13.4 05/26/2021   HCT 41.7 05/26/2021   PLT 170 05/26/2021   GLUCOSE 111 (H) 05/26/2021   CHOL 143 09/01/2019   TRIG 69.0 09/01/2019   HDL 61.80 09/01/2019   LDLDIRECT 179.1 10/09/2007   LDLCALC 67 09/01/2019   ALT 9 06/29/2020   AST 15 06/29/2020   NA 138 05/26/2021   K 4.0 05/26/2021   CL 105 05/26/2021   CREATININE 1.05 (H) 05/26/2021   BUN 19 05/26/2021   CO2 27 05/26/2021   TSH 2.58 09/01/2019   INR 1.0 10/31/2020   HGBA1C 6.0 09/01/2019   BP Readings from Last 3 Encounters:  05/26/21 125/68  10/31/20 (!) 119/59  08/21/20 (!) 152/82  Urinalysis    Component Value Date/Time   COLORURINE YELLOW 05/26/2021 1757   APPEARANCEUR HAZY (A) 05/26/2021 1757   LABSPEC 1.015 05/26/2021 1757   PHURINE 6.5 05/26/2021 1757   GLUCOSEU NEGATIVE 05/26/2021 1757   HGBUR NEGATIVE 05/26/2021 1757   HGBUR 2+ 07/12/2009 1451   BILIRUBINUR NEGATIVE 05/26/2021 1757   BILIRUBINUR 3+ 06/20/2020  1159  KETONESUR NEGATIVE 05/26/2021 1757   PROTEINUR 30 (A) 05/26/2021 1757   UROBILINOGEN 0.2 06/20/2020 1159   UROBILINOGEN 0.2 07/12/2009 2141   NITRITE NEGATIVE 05/26/2021 1757   LEUKOCYTESUR SMALL (A) 05/26/2021 1757      ASSESSMENT AND PLAN:  Discussed the following assessment and plan:  No diagnosis found.  -Patient advised to return or notify health care team  if  new concerns arise.  There are no Patient Instructions on file for this visit.   Standley Brooking. Careena Degraffenreid M.D.

## 2021-06-04 ENCOUNTER — Ambulatory Visit (INDEPENDENT_AMBULATORY_CARE_PROVIDER_SITE_OTHER): Payer: PPO | Admitting: Internal Medicine

## 2021-06-04 ENCOUNTER — Encounter: Payer: Self-pay | Admitting: Internal Medicine

## 2021-06-04 VITALS — BP 136/86 | HR 72 | Temp 98.1°F | Ht 60.0 in | Wt 136.0 lb

## 2021-06-04 DIAGNOSIS — F419 Anxiety disorder, unspecified: Secondary | ICD-10-CM

## 2021-06-04 DIAGNOSIS — I10 Essential (primary) hypertension: Secondary | ICD-10-CM

## 2021-06-04 DIAGNOSIS — M159 Polyosteoarthritis, unspecified: Secondary | ICD-10-CM

## 2021-06-04 DIAGNOSIS — Z79899 Other long term (current) drug therapy: Secondary | ICD-10-CM

## 2021-06-04 DIAGNOSIS — R220 Localized swelling, mass and lump, head: Secondary | ICD-10-CM | POA: Diagnosis not present

## 2021-06-04 DIAGNOSIS — Z87898 Personal history of other specified conditions: Secondary | ICD-10-CM | POA: Diagnosis not present

## 2021-06-04 MED ORDER — AMLODIPINE BESYLATE 5 MG PO TABS: 5.0000 mg | ORAL_TABLET | Freq: Every day | ORAL | 0 refills | Status: DC

## 2021-06-04 MED ORDER — DULOXETINE HCL 60 MG PO CPEP: 60.0000 mg | ORAL_CAPSULE | Freq: Every day | ORAL | 1 refills | Status: DC

## 2021-06-04 MED ORDER — ROSUVASTATIN CALCIUM 40 MG PO TABS: 40.0000 mg | ORAL_TABLET | Freq: Every day | ORAL | 0 refills | Status: DC

## 2021-06-04 MED ORDER — DULOXETINE HCL 20 MG PO CPEP: 20.0000 mg | ORAL_CAPSULE | Freq: Every day | ORAL | 3 refills | Status: DC

## 2021-06-04 NOTE — Patient Instructions (Addendum)
Good to see  you today   Continue Cymbalta 60 mg per day and try adding on 20 mg per day to make total 80 mg per day for anxiety and pain management .  Will do  dermatology referral .   Blood that you can see in urine   without  known cause  we should have a urologist see  you.

## 2021-06-04 NOTE — Progress Notes (Signed)
Chief Complaint  Patient presents with   Anxiety   Hospitalization Follow-up    ER visit for hematuria on 05/26/21    HPI: [name] [name] 81 y.o. come in with daughter who drove today for a number of issues Tends to be anxious episodically described asEdginess  anxious . Once in a while gets edginess  .  But denies panic attack or being frozen of ability to get things done that is necessary.  Her daughter does most of the driving but she will do some short-term driving  Daughter feel she gets edgy or anxious when pressure  any thing to be done.   Daughter sharon says also .   If going out , she does not refrain from going out as appropriate Other issues went to the emergency room because of blood in her urine that was painless without symptoms when she got there her evaluation was negative for UTI.  She has remote history of kidney stones but no pain with this.  It has not recurred.  Daughter asks about ability to take ibuprofen even intermittently.  She is on Cymbalta 60 mg a day has not been taking it twice daily as it is as written.  It is for pain and anxiety and mood.  No recent falls but concerned about risk of falling does not use a cane or assistant.  She is under care after aneurysm evaluation repair and follow-up with neurology daughters mentions there is some slowness of memory and Ms. Krock herself feels that for being 66 it is not too bad.  And she is not scared or apprehensive of this.  Daughter and she ask if there is any medicine she should take for mental slowness or memory.  There is a bump on her right forehead near her eyebrow that has been growing in size may have slight erythema wonders what to do.  Last  visit with me  March 2022 ofr urie sx  uti Was seen in ed for poss hematuria  '2 4 23  ' resolved  ROS: See pertinent positives and negatives per HPI.  Past Medical History:  Diagnosis Date   Acute kidney failure ([place])    5 years ago   ACUTE KIDNEY FAILURE UNSPECIFIED  07/12/2009   Allergy    Anemia    Anxiety    Arthritis    Basal cell carcinoma    Blood clot in vein    states it was not deep vein   CAD (coronary artery disease)    Mild nonobstructive by CT   Cataract    Cerebral aneurysm without rupture 2021   Chronic kidney disease    stones   Depression    around the time of her husband's death   Diverticulosis of colon (without mention of hemorrhage)    Fibromyalgia    GERD (gastroesophageal reflux disease)    Headache    Hiatal hernia    History of kidney stones    Hyperlipidemia    Hypertension    Interstitial cystitis    Kidney stone    Osteopenia    PONV (postoperative nausea and vomiting)    Scoliosis    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   Qualifier: Diagnosis of  By: Sharlett Iles MD [name]    Vertigo     Family History  Problem Relation Age of Onset   Alzheimer's disease Mother    Lung cancer Father        tobacco   Kidney cancer Son  Stroke Son    Arthritis Son    Hypertension Brother        mom's side   Osteoporosis Sister    Hypertension Sister    Breast cancer Cousin    Heart disease Maternal Uncle        x 4   Heart disease Maternal Aunt        x 4   Diabetes Paternal Grandmother    Diabetes Paternal Uncle        x 2   Arthritis Daughter    Colon cancer Neg Hx     Social History   Socioeconomic History   Marital status: Widowed    Spouse name: Not on file   Number of children: 2   Years of education: Not on file   Highest education level: Not on file  Occupational History   Occupation: Retired   Tobacco Use   Smoking status: Never   Smokeless tobacco: Never  Vaping Use   Vaping Use: Never used  Substance and Sexual Activity   Alcohol use: Not Currently    Alcohol/week: 0.0 standard drinks    Comment: rare   Drug use: No   Sexual activity: Not on file  Other Topics Concern   Not on file  Social History Narrative   Widowed 10/09 husband [place] retired   Daily  caffeine use.   Right handed   Social Determinants of Health   Financial Resource Strain: Low Risk    Difficulty of Paying Living Expenses: Not hard at all  Food Insecurity: No Food Insecurity   Worried About Charity fundraiser in the Last Year: Never true   Ran Out of Food in the Last Year: Never true  Transportation Needs: No Transportation Needs   Lack of Transportation (Medical): No   Lack of Transportation (Non-Medical): No  Physical Activity: Inactive   Days of Exercise per Week: 0 days   Minutes of Exercise per Session: 0 min  Stress: No Stress Concern Present   Feeling of Stress : Not at all  Social Connections: Socially Isolated   Frequency of Communication with Friends and Family: More than three times a week   Frequency of Social Gatherings with Friends and Family: Three times a week   Attends Religious Services: Never   Active Member of Clubs or Organizations: No   Attends Archivist Meetings: Never   Marital Status: Widowed    Outpatient Medications Prior to Visit  Medication Sig Dispense Refill   BAYER LOW DOSE 81 MG chewable tablet Chew 81 mg by mouth every evening.      melatonin 5 MG TABS Take 5 mg by mouth at bedtime.     metoprolol tartrate (LOPRESSOR) 25 MG tablet TAKE 1 TABLET BY MOUTH TWICE A DAY 180 tablet 0   omeprazole (PRILOSEC) 20 MG capsule TAKE 1 CAPSULE BY MOUTH TWICE A DAY 180 capsule 0   Probiotic Product (ALIGN PO) Take 1 tablet by mouth daily.      SUPREP BOWEL PREP KIT 17.5-3.13-1.6 GM/177ML SOLN Take 1 kit by mouth as directed. For colonoscopy prep 354 mL 0   amLODipine (NORVASC) 5 MG tablet TAKE 1 TABLET BY MOUTH EVERY DAY 90 tablet 0   DULoxetine (CYMBALTA) 60 MG capsule TAKE 1 CAPSULE BY MOUTH TWICE A DAY 180 capsule 1   rosuvastatin (CRESTOR) 40 MG tablet TAKE 1 TABLET BY MOUTH EVERY DAY 90 tablet 0   No facility-administered medications prior to visit.  EXAM:  BP 136/86 (BP Location: Right Arm, Patient Position:  Sitting, Cuff Size: Large)    Pulse 72    Temp 98.1 F (36.7 C) (Oral)    Ht 5' (1.524 m)    Wt 136 lb (61.7 kg)    SpO2 99%    BMI 26.56 kg/m   Body mass index is 26.56 kg/m. BP Readings from Last 3 Encounters:  06/04/21 136/86  05/26/21 125/68  10/31/20 (!) 119/59    GENERAL: vitals reviewed and listed above, alert, oriented, appears well hydrated and in no acute distress HEENT: atraumatic, conjunctiva  clear, no obvious abnormalities on inspection of external nose and ears over right forehead near eyebrow there is a 1+ centimeter cystic firm lump with a poor but no redness.  OP : masked  NECK: no obvious masses on inspection palpation  LUNGS: clear to auscultation bilaterally, no wheezes, rales or rhonchi, good air movement CV: HRRR, no clubbing cyanosis or  peripheral edema nl cap refill  MS: moves all extremities without noticeable focal  abnormality has obvious deformity arthritis hands but is ambulatory independent PSYCH: pleasant and cooperative,  Lab Results  Component Value Date   WBC 4.0 05/26/2021   HGB 13.4 05/26/2021   HCT 41.7 05/26/2021   PLT 170 05/26/2021   GLUCOSE 111 (H) 05/26/2021   CHOL 143 09/01/2019   TRIG 69.0 09/01/2019   HDL 61.80 09/01/2019   LDLDIRECT 179.1 10/09/2007   LDLCALC 67 09/01/2019   ALT 9 06/29/2020   AST 15 06/29/2020   NA 138 05/26/2021   K 4.0 05/26/2021   CL 105 05/26/2021   CREATININE 1.05 (H) 05/26/2021   BUN 19 05/26/2021   CO2 27 05/26/2021   TSH 2.58 09/01/2019   INR 1.0 10/31/2020   HGBA1C 6.0 09/01/2019   BP Readings from Last 3 Encounters:  06/04/21 136/86  05/26/21 125/68  10/31/20 (!) 119/59    ASSESSMENT AND PLAN:  Discussed the following assessment and plan:  Medication management  History of gross hematuria - Plan: Ambulatory referral to Urology  Essential hypertension  Osteoarthritis of multiple joints, unspecified osteoarthritis type  Lump on face - poss cyst growing  - Plan: Ambulatory referral to  Dermatology  Anxiety - Acceptable on 60 mg Cymbalta we will try adding 20 to 80 mg a day to see if it helps with pain and anxiety. Concerned about memory from daughter ;can ask some questions to the neurologist when she has her follow-up.  Patient herself is not that worried. Agree she should add a cane when she is outside on uneven ground to go up and down curbs etc. We will refill medication today amlodipine and rosuvastatin. And increase  cymbalta to 80 mg trial  -Patient advised to return or notify health care team  if  new concerns arise.  Patient Instructions  Good to see  you today   Continue Cymbalta 60 mg per day and try adding on 20 mg per day to make total 80 mg per day for anxiety and pain management .  Will do  dermatology referral .   Blood that you can see in urine   without  known cause  we should have a urologist see  you.      Standley Brooking. Miachel Nardelli M.D.

## 2021-06-18 ENCOUNTER — Other Ambulatory Visit: Payer: Self-pay | Admitting: Internal Medicine

## 2021-06-18 ENCOUNTER — Telehealth: Payer: Self-pay

## 2021-06-18 NOTE — Telephone Encounter (Signed)
Pt informed of the message and verbalized understanding. Pt stated she would call the office back if pain worsens tomorrow

## 2021-06-18 NOTE — Telephone Encounter (Signed)
I spoke with the pt in regards to discoloration and bruising on her right ring finger. Pt reported that her finger is purple underneath the nail and partially down her finger. Pt stated that she noticed it on Friday and has gotten slightly better since. Pt states that there is a small white dot in the middle of her finger which she denies seeing before. Pt denies any known injury at this time. I offered pt a virtual visit to discuss but pt declined at this time to get further advice from PCP.

## 2021-06-18 NOTE — Telephone Encounter (Signed)
Would need a visit or visual to give more advice  if she has a camera she can take a picture on a smart phone.  If she has severe pain should seek urgent care.

## 2021-06-21 ENCOUNTER — Other Ambulatory Visit: Payer: Self-pay | Admitting: Internal Medicine

## 2021-06-26 ENCOUNTER — Ambulatory Visit (INDEPENDENT_AMBULATORY_CARE_PROVIDER_SITE_OTHER): Payer: PPO

## 2021-06-26 VITALS — Ht 60.0 in | Wt 136.0 lb

## 2021-06-26 DIAGNOSIS — Z0000 Encounter for general adult medical examination without abnormal findings: Secondary | ICD-10-CM | POA: Diagnosis not present

## 2021-06-26 NOTE — Patient Instructions (Addendum)
[name] [name] , Thank you for taking time to come for your Medicare Wellness Visit. I appreciate your ongoing commitment to your health goals. Please review the following plan we discussed and let me know if I can assist you in the future.   These are the goals we discussed:  Goals       Exercise 3x per week (30 min per time)      patient      To engage more when feeling better Diabetes and weight loss; [facility]  Phone: [phone]   [place] [facility]; [phone] [address] [place]; [phone] Get resource to get information on any and all community programs for Seniors  [place]: [phone] Community Health Response Program -762-831-5176 Public Health Dept; Need to be a skilled visit but can assist with bathing as well; [phone]  Adult center for Enrichment;  Call Senior Line; [phone]  Adult day services include Adult Day Care, Adult Day Healthcare, Group Respite, Care Partners, Volunteer In [employer], Education and Support Program     Dept of Social Services; Call [phone] and ask for SW on call  Options for Medicaid include the Community Alternatives program; [place] (personal care services) or PACE program, which is a medical and social program combined  Braulio Conte manages the community Alternatives program at the [employer]; [phone] (this is a program with a waiting list but provides SNF care at home;  [facility] required; Call Claiborne Billings and she will send out packet of information   Caregiver support group and information regarding [place] is at the; [facility] Address: [address] [place], [place] [address]  Phone: [phone]   [employer] general resources for food etc   Http://nihseniorhealth.giv  Deaf & Hard of Hearing Division Services - can assist with hearing aid x 1  No reviews  [employer] Office  [address]  [phone]  ----       Patient Stated (pt-stated)      I would like to travel to [facility].        This is a list of the screening recommended for you and due dates:  Health Maintenance  Topic Date Due   Mammogram  03/15/2014   Zoster (Shingles) Vaccine (1 of 2) 09/26/2021*   Tetanus Vaccine  06/27/2022*   Pneumonia Vaccine  Completed   Flu Shot  Completed   DEXA scan (bone density measurement)  Completed   COVID-19 Vaccine  Completed   HPV Vaccine  Aged Out  *Topic was postponed. The date shown is not the original due date.    Advanced directives: No Patient deferred  Conditions/risks identified: None  Next appointment: Follow up in one year for your annual wellness visit    Preventive Care 65 Years and Older, Female Preventive care refers to lifestyle choices and visits with your health care provider that can promote health and wellness. What does preventive care include? A yearly physical exam. This is also called an annual well check. Dental exams once or twice a year. Routine eye exams. Ask your health care provider how often you should have your eyes checked. Personal lifestyle choices, including: Daily care of your teeth and gums. Regular physical activity. Eating a healthy diet. Avoiding tobacco and drug use. Limiting alcohol use. Practicing safe sex. Taking low-dose aspirin every day. Taking vitamin and mineral supplements as recommended by your health care provider. What happens during an annual well check? The  services and screenings done by your health care provider during your annual well check will depend on your age, overall health, lifestyle risk factors, and family history of disease. Counseling  Your health care provider may ask you questions about your: Alcohol use. Tobacco use. Drug use. Emotional well-being. Home and relationship well-being. Sexual activity. Eating habits. History of falls. Memory and ability to understand  (cognition). Work and work Statistician. Reproductive health. Screening  You may have the following tests or measurements: Height, weight, and BMI. Blood pressure. Lipid and cholesterol levels. These may be checked every 5 years, or more frequently if you are over 36 years old. Skin check. Lung cancer screening. You may have this screening every year starting at age 43 if you have a 30-pack-year history of smoking and currently smoke or have quit within the past 15 years. Fecal occult blood test (FOBT) of the stool. You may have this test every year starting at age 31. Flexible sigmoidoscopy or colonoscopy. You may have a sigmoidoscopy every 5 years or a colonoscopy every 10 years starting at age 63. Hepatitis C blood test. Hepatitis B blood test. Sexually transmitted disease (STD) testing. Diabetes screening. This is done by checking your blood sugar (glucose) after you have not eaten for a while (fasting). You may have this done every 1-3 years. Bone density scan. This is done to screen for osteoporosis. You may have this done starting at age 1. Mammogram. This may be done every 1-2 years. Talk to your health care provider about how often you should have regular mammograms. Talk with your health care provider about your test results, treatment options, and if necessary, the need for more tests. Vaccines  Your health care provider may recommend certain vaccines, such as: Influenza vaccine. This is recommended every year. Tetanus, diphtheria, and acellular pertussis (Tdap, Td) vaccine. You may need a Td booster every 10 years. Zoster vaccine. You may need this after age 26. Pneumococcal 13-valent conjugate (PCV13) vaccine. One dose is recommended after age 66. Pneumococcal polysaccharide (PPSV23) vaccine. One dose is recommended after age 66. Talk to your health care provider about which screenings and vaccines you need and how often you need them. This information is not intended to  replace advice given to you by your health care provider. Make sure you discuss any questions you have with your health care provider. Document Released: 05/05/2015 Document Revised: 12/27/2015 Document Reviewed: 02/07/2015 Elsevier Interactive Patient Education  2017 [place] Prevention in the Home Falls can cause injuries. They can happen to people of all ages. There are many things you can do to make your home safe and to help prevent falls. What can I do on the outside of my home? Regularly fix the edges of walkways and driveways and fix any cracks. Remove anything that might make you trip as you walk through a door, such as a raised step or threshold. Trim any bushes or trees on the path to your home. Use bright outdoor lighting. Clear any walking paths of anything that might make someone trip, such as rocks or tools. Regularly check to see if handrails are loose or broken. Make sure that both sides of any steps have handrails. Any raised decks and porches should have guardrails on the edges. Have any leaves, snow, or ice cleared regularly. Use sand or salt on walking paths during winter. Clean up any spills in your garage right away. This includes oil or grease spills. What can I do in the bathroom?  Use night lights. Install grab bars by the toilet and in the tub and shower. Do not use towel bars as grab bars. Use non-skid mats or decals in the tub or shower. If you need to sit down in the shower, use a plastic, non-slip stool. Keep the floor dry. Clean up any water that spills on the floor as soon as it happens. Remove soap buildup in the tub or shower regularly. Attach bath mats securely with double-sided non-slip rug tape. Do not have throw rugs and other things on the floor that can make you trip. What can I do in the bedroom? Use night lights. Make sure that you have a light by your bed that is easy to reach. Do not use any sheets or blankets that are too big for  your bed. They should not hang down onto the floor. Have a firm chair that has side arms. You can use this for support while you get dressed. Do not have throw rugs and other things on the floor that can make you trip. What can I do in the kitchen? Clean up any spills right away. Avoid walking on wet floors. Keep items that you use a lot in easy-to-reach places. If you need to reach something above you, use a strong step stool that has a grab bar. Keep electrical cords out of the way. Do not use floor polish or wax that makes floors slippery. If you must use wax, use non-skid floor wax. Do not have throw rugs and other things on the floor that can make you trip. What can I do with my stairs? Do not leave any items on the stairs. Make sure that there are handrails on both sides of the stairs and use them. Fix handrails that are broken or loose. Make sure that handrails are as long as the stairways. Check any carpeting to make sure that it is firmly attached to the stairs. Fix any carpet that is loose or worn. Avoid having throw rugs at the top or bottom of the stairs. If you do have throw rugs, attach them to the floor with carpet tape. Make sure that you have a light switch at the top of the stairs and the bottom of the stairs. If you do not have them, ask someone to add them for you. What else can I do to help prevent falls? Wear shoes that: Do not have high heels. Have rubber bottoms. Are comfortable and fit you well. Are closed at the toe. Do not wear sandals. If you use a stepladder: Make sure that it is fully opened. Do not climb a closed stepladder. Make sure that both sides of the stepladder are locked into place. Ask someone to hold it for you, if possible. Clearly mark and make sure that you can see: Any grab bars or handrails. First and last steps. Where the edge of each step is. Use tools that help you move around (mobility aids) if they are needed. These  include: Canes. Walkers. Scooters. Crutches. Turn on the lights when you go into a dark area. Replace any light bulbs as soon as they burn out. Set up your furniture so you have a clear path. Avoid moving your furniture around. If any of your floors are uneven, fix them. If there are any pets around you, be aware of where they are. Review your medicines with your doctor. Some medicines can make you feel dizzy. This can increase your chance of falling. Ask your doctor what other  things that you can do to help prevent falls. This information is not intended to replace advice given to you by your health care provider. Make sure you discuss any questions you have with your health care provider. Document Released: 02/02/2009 Document Revised: 09/14/2015 Document Reviewed: 05/13/2014 Elsevier Interactive Patient Education  2017 [employer].

## 2021-06-26 NOTE — Progress Notes (Signed)
Subjective:   [name] [name] is a 81 y.o. female who presents for Medicare Annual (Subsequent) preventive examination.  Review of Systems    Virtual Visit via Telephone Note  I connected with  [name] on 06/26/21 at  2:45 PM EST by telephone and verified that I am speaking with the correct person using two identifiers.  Location: Patient: Home Provider: Office Persons participating in the virtual visit: patient/Nurse Health Advisor   I discussed the limitations, risks, security and privacy concerns of performing an evaluation and management service by telephone and the availability of in person appointments. The patient expressed understanding and agreed to proceed.  Interactive audio and video telecommunications were attempted between this nurse and patient, however failed, due to patient having technical difficulties OR patient did not have access to video capability.  We continued and completed visit with audio only.  Some vital signs may be absent or patient reported.   Criselda Peaches, LPN  Cardiac Risk Factors include: advanced age (>22mn, >>58women);hypertension     Objective:    Today's Vitals   06/26/21 1450  Weight: 136 lb (61.7 kg)  Height: 5' (1.524 m)   Body mass index is 26.56 kg/m.  Advanced Directives 06/26/2021 10/31/2020 06/19/2020 12/17/2019 11/02/2019 10/06/2019 11/17/2015  Does Patient Have a Medical Advance Directive? No No No No No No No  Would patient like information on creating a medical advance directive? No - Patient declined No - Patient declined No - Patient declined No - Patient declined No - Patient declined - No - patient declined information    Current Medications (verified) Outpatient Encounter Medications as of 06/26/2021  Medication Sig   amLODipine (NORVASC) 5 MG tablet Take 1 tablet (5 mg total) by mouth daily.   BAYER LOW DOSE 81 MG chewable tablet Chew 81 mg by mouth every evening.    DULoxetine (CYMBALTA) 20 MG capsule Take 1 capsule  (20 mg total) by mouth daily. In addition to 60 mg per day to total 80 mg per day   DULoxetine (CYMBALTA) 60 MG capsule Take 1 capsule (60 mg total) by mouth daily. Take with 20 mg per day cymbalta to total 80 mg per day   melatonin 5 MG TABS Take 5 mg by mouth at bedtime.   metoprolol tartrate (LOPRESSOR) 25 MG tablet TAKE 1 TABLET BY MOUTH TWICE A DAY   omeprazole (PRILOSEC) 20 MG capsule TAKE 1 CAPSULE BY MOUTH TWICE A DAY   Probiotic Product (ALIGN PO) Take 1 tablet by mouth daily.    rosuvastatin (CRESTOR) 40 MG tablet Take 1 tablet (40 mg total) by mouth daily.   SUPREP BOWEL PREP KIT 17.5-3.13-1.6 GM/177ML SOLN Take 1 kit by mouth as directed. For colonoscopy prep   No facility-administered encounter medications on file as of 06/26/2021.    Allergies (verified) Gadolinium derivatives, Aspirin, Lactose intolerance (gi), Meloxicam, Metoprolol, Sulfamethoxazole, and Tylenol [acetaminophen]   History: Past Medical History:  Diagnosis Date   [place]   ACUTE KIDNEY FAILURE UNSPECIFIED 07/12/2009   Allergy    Anemia    Anxiety    Arthritis    Basal cell carcinoma    Blood clot in vein    states it was not deep vein   CAD (coronary artery disease)    Mild nonobstructive by CT   Cataract    Cerebral aneurysm without rupture 2021   Chronic kidney disease    stones   Depression  around the time of her husband's death   Diverticulosis of colon (without mention of hemorrhage)    Fibromyalgia    GERD (gastroesophageal reflux disease)    Headache    Hiatal hernia    History of kidney stones    Hyperlipidemia    Hypertension    Interstitial cystitis    Kidney stone    Osteopenia    PONV (postoperative nausea and vomiting)    Scoliosis    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   Qualifier: Diagnosis of  By: Sharlett Iles MD [name]    Vertigo    Past Surgical History:  Procedure Laterality Date   BLADDER TUMOR EXCISION  2001   CATARACT  EXTRACTION     bilateral   CHOLECYSTECTOMY     CYSTECTOMY  2001   benign   ESOPHAGEAL MANOMETRY N/A 07/13/2012   Procedure: ESOPHAGEAL MANOMETRY (EM);  Surgeon: Sable Feil, MD;  Location: [facility];  Service: Endoscopy;  Laterality: N/A;   HIATAL HERNIA REPAIR     IR ANGIO INTRA EXTRACRAN SEL INTERNAL CAROTID BILAT MOD SED  11/02/2019   IR ANGIO INTRA EXTRACRAN SEL INTERNAL CAROTID BILAT MOD SED  10/31/2020   IR ANGIO VERTEBRAL SEL VERTEBRAL BILAT MOD SED  12/17/2019   IR ANGIO VERTEBRAL SEL VERTEBRAL UNI L MOD SED  10/31/2020   IR ANGIO VERTEBRAL SEL VERTEBRAL UNI R MOD SED  11/02/2019   IR ANGIOGRAM FOLLOW UP STUDY  12/17/2019   IR ANGIOGRAM FOLLOW UP STUDY  12/17/2019   IR ANGIOGRAM FOLLOW UP STUDY  12/17/2019   IR ANGIOGRAM FOLLOW UP STUDY  12/17/2019   IR NEURO EACH ADD'L AFTER BASIC UNI LEFT (MS)  12/17/2019   IR TRANSCATH/EMBOLIZ  12/17/2019   LUMBAR DISC SURGERY     x 2 [facility]   RADIOLOGY WITH ANESTHESIA N/A 12/17/2019   Procedure: 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;  Surgeon: Consuella Lose, MD;  Location: [place];  Service: Radiology;  Laterality: N/A;   REVERSE SHOULDER ARTHROPLASTY Right 08/12/2014   Procedure: RIGHT REVERSE TOTAL SHOULDER ARTHROPLASTY;  Surgeon: Netta Cedars, MD;  Location: [place];  Service: Orthopedics;  Laterality: Right;   TENDON REPAIR Right 11/2005   right shoulder   TOTAL KNEE ARTHROPLASTY Left    left   TOTAL KNEE ARTHROPLASTY Right 11/17/2015   Procedure: RIGHT TOTAL KNEE ARTHROPLASTY;  Surgeon: Sydnee Cabal, MD;  Location: [facility];  Service: Orthopedics;  Laterality: Right;   Family History  Problem Relation Age of Onset   Alzheimer's disease Mother    Lung cancer Father        tobacco   Kidney cancer Son    Stroke Son    Arthritis Son    Hypertension Brother        mom's side   Osteoporosis Sister    Hypertension Sister    Breast cancer Cousin    Heart disease Maternal Uncle        x 4   Heart disease  Maternal Aunt        x 4   Diabetes Paternal Grandmother    Diabetes Paternal Uncle        x 2   Arthritis Daughter    Colon cancer Neg Hx    Social History   Socioeconomic History   Marital status: Widowed    Spouse name: Not on file   Number of children: 2   Years of education: Not on file   Highest education level: Not on file  Occupational  History   Occupation: Retired   Tobacco Use   Smoking status: Never   Smokeless tobacco: Never  Vaping Use   Vaping Use: Never used  Substance and Sexual Activity   Alcohol use: Not Currently    Alcohol/week: 0.0 standard drinks    Comment: rare   Drug use: No   Sexual activity: Not on file  Other Topics Concern   Not on file  Social History Narrative   Widowed 10/09 husband [place] retired   Daily caffeine use.   Right handed   Social Determinants of Health   Financial Resource Strain: Low Risk    Difficulty of Paying Living Expenses: Not hard at all  Food Insecurity: No Food Insecurity   Worried About Charity fundraiser in the Last Year: Never true   Ran Out of Food in the Last Year: Never true  Transportation Needs: No Transportation Needs   Lack of Transportation (Medical): No   Lack of Transportation (Non-Medical): No  Physical Activity: Inactive   Days of Exercise per Week: 0 days   Minutes of Exercise per Session: 0 min  Stress: No Stress Concern Present   Feeling of Stress : Not at all  Social Connections: Moderately Integrated   Frequency of Communication with Friends and Family: More than three times a week   Frequency of Social Gatherings with Friends and Family: More than three times a week   Attends Religious Services: 1 to 4 times per year   Active Member of [employer] or Organizations: Yes   Attends Archivist Meetings: 1 to 4 times per year   Marital Status: Widowed     Clinical Intake:  Pre-visit preparation completed: YesDiabetic?  No  Interpreter Needed?: NoActivities  of Daily Living In your present state of health, do you have any difficulty performing the following activities: 06/26/2021 10/31/2020  Hearing? N N  Vision? N N  Difficulty concentrating or making decisions? N N  Walking or climbing stairs? N N  Dressing or bathing? N N  Doing errands, shopping? N -  Preparing Food and eating ? N -  Using the Toilet? N -  In the past six months, have you accidently leaked urine? N -  Do you have problems with loss of bowel control? N -  Managing your Medications? N -  Managing your Finances? N -  Housekeeping or managing your Housekeeping? N -  Some recent data might be hidden    Patient Care Team: Panosh, Standley Brooking, MD as PCP - General Sydnee Cabal, MD as Attending Physician (Orthopedic Surgery) Suella Broad, MD (Physical Medicine and Rehabilitation) Clent Jacks, MD as Consulting Physician (Ophthalmology) [facility] (Nephrology) Audery Amel Sharma Covert, [facility] (Inactive) as Pharmacist (Pharmacist)  Indicate any recent Medical Services you may have received from other than [facility] providers in the past year (date may be approximate).     Assessment:   This is a routine wellness examination for Farris.  Hearing/Vision screen Hearing Screening - Comments:: No difficulty hearing Vision Screening - Comments:: Wears reading glasses. Followed by Dr Katy Fitch  Dietary issues and exercise activities discussed: Exercise limited by: None identified   Goals Addressed               This Visit's Progress     Patient Stated (pt-stated)        I would like to travel to [facility].       Depression Screen PHQ 2/9 Scores 06/26/2021 06/19/2020 09/01/2019 02/20/2016 10/12/2014  06/16/2013  PHQ - 2 Score 0 0 0 2 0 1  PHQ- 9 Score - - 0 6 - -    Fall Risk Fall Risk  06/26/2021 06/19/2020 10/06/2019 09/01/2019 11/19/2018  Falls in the past year? 0 0 0 0 0  Comment - - - - Emmi Telephone Survey: data to providers prior to load  Number falls in past yr: 0 0 0 - -   Injury with Fall? 0 0 0 - -  Risk for fall due to : No Fall Risks History of fall(s) - - -  Follow up - Falls evaluation completed;Falls prevention discussed - - -    FALL RISK PREVENTION PERTAINING TO THE HOME:  Any stairs in or around the home? No  If so, are there any without handrails? No  Home free of loose throw rugs in walkways, pet beds, electrical cords, etc? Yes  Adequate lighting in your home to reduce risk of falls? Yes   ASSISTIVE DEVICES UTILIZED TO PREVENT FALLS:  Life alert? No  Use of a cane, walker or w/c? No  Grab bars in the bathroom? No  Shower chair or bench in shower? No  Elevated toilet seat or a handicapped toilet? No   TIMED UP AND GO:  Was the test performed? No . Audio Visit  Cognitive Function:   6CIT Screen 06/26/2021  What Year? 0 points  What month? 0 points  What time? 0 points  Count back from 20 0 points  Months in reverse 0 points  Repeat phrase 0 points  Total Score 0    Immunizations Immunization History  Administered Date(s) Administered   Influenza Split 03/22/2011, 01/16/2012, 01/20/2013   Influenza Whole 02/08/2010   Influenza, High Dose Seasonal PF 03/14/2014, 05/29/2015, 02/13/2016, 05/01/2018, 02/09/2019, 04/07/2021   Influenza-Unspecified 06/12/2020   PFIZER(Purple Top)SARS-COV-2 Vaccination 05/13/2019, 06/02/2019, 02/05/2020   PPD Test 11/20/2015   Pfizer Covid-19 Vaccine Bivalent Booster 82yr & up 04/07/2021   Pneumococcal Conjugate-13 06/16/2013   Pneumococcal Polysaccharide-23 07/25/2008   Td 11/21/2007    TDAP status: Due, Education has been provided regarding the importance of this vaccine. Advised may receive this vaccine at local pharmacy or Health Dept. Aware to provide a copy of the vaccination record if obtained from local pharmacy or Health Dept. Verbalized acceptance and understanding.  Flu Vaccine status: Up to date  Pneumococcal vaccine status: Up to date  Covid-19 vaccine status: Completed  vaccines  Qualifies for Shingles Vaccine? Yes   Zostavax completed No   Shingrix Completed?: No.    Education has been provided regarding the importance of this vaccine. Patient has been advised to call insurance company to determine out of pocket expense if they have not yet received this vaccine. Advised may also receive vaccine at local pharmacy or Health Dept. Verbalized acceptance and understanding.  Screening Tests Health Maintenance  Topic Date Due   MAMMOGRAM  03/15/2014   Zoster Vaccines- Shingrix (1 of 2) 09/26/2021 (Originally 01/16/1960)   TETANUS/TDAP  06/27/2022 (Originally 11/20/2017)   Pneumonia Vaccine 81 Years old  Completed   INFLUENZA VACCINE  Completed   DEXA SCAN  Completed   COVID-19 Vaccine  Completed   HPV VACCINES  Aged Out    Health Maintenance  Health Maintenance Due  Topic Date Due   MAMMOGRAM  03/15/2014    Colonoscopy: Patient deferred pending appt  Mammogram status: No longer required due to Age.  Bone Density status: Completed 02/23/13. Results reflect: Bone density results: OSTEOPOROSIS. Repeat every 2 years.  Lung Cancer Screening: (Low Dose CT Chest recommended if Age 43-80 years, 30 pack-year currently smoking OR have quit w/in 15years.) does not qualify.    Additional Screening:  Hepatitis C Screening: does not qualify; Completed   Vision Screening: Recommended annual ophthalmology exams for early detection of glaucoma and other disorders of the eye. Is the patient up to date with their annual eye exam?  Yes  Who is the provider or what is the name of the office in which the patient attends annual eye exams? Dr Katy Fitch If pt is not established with a provider, would they like to be referred to a provider to establish care? No .   Dental Screening: Recommended annual dental exams for proper oral hygiene  Community Resource Referral / Chronic Care Management:  CRR required this visit?  No   CCM required this visit?  No      Plan:      I have personally reviewed and noted the following in the patients chart:   Medical and social history Use of alcohol, tobacco or illicit drugs  Current medications and supplements including opioid prescriptions.  Functional ability and status Nutritional status Physical activity Advanced directives List of other physicians Hospitalizations, surgeries, and ER visits in previous 12 months Vitals Screenings to include cognitive, depression, and falls Referrals and appointments  In addition, I have reviewed and discussed with patient certain preventive protocols, quality metrics, and best practice recommendations. A written personalized care plan for preventive services as well as general preventive health recommendations were provided to patient.     Criselda Peaches, LPN   04/30/4035   Nurse Notes: None

## 2021-06-29 ENCOUNTER — Telehealth: Payer: Self-pay | Admitting: Internal Medicine

## 2021-06-29 NOTE — Telephone Encounter (Signed)
Last Ov 06/04/21 ? ?

## 2021-06-29 NOTE — Telephone Encounter (Signed)
Pt is calling and would like to know if dr Regis Bill would call into pharm  [facility] she is having trouble falling to sleep. Pt forgot to mention to md and beverly  ?[id number] [place], [place]:  [phone] ?Fax:  [phone] ?  ? ?

## 2021-07-01 MED ORDER — RAMELTEON 8 MG PO TABS: 8.0000 mg | ORAL_TABLET | Freq: Every day | ORAL | 1 refills | Status: DC

## 2021-07-02 NOTE — Telephone Encounter (Signed)
Left voicemail for patient to call the office 

## 2021-07-03 NOTE — Telephone Encounter (Signed)
Patient informed of message  

## 2021-07-04 ENCOUNTER — Other Ambulatory Visit: Payer: Self-pay | Admitting: Internal Medicine

## 2021-07-04 DIAGNOSIS — Z1231 Encounter for screening mammogram for malignant neoplasm of breast: Secondary | ICD-10-CM

## 2021-07-10 ENCOUNTER — Ambulatory Visit: Payer: PPO

## 2021-07-20 DIAGNOSIS — L728 Other follicular cysts of the skin and subcutaneous tissue: Secondary | ICD-10-CM | POA: Diagnosis not present

## 2021-07-25 NOTE — Progress Notes (Signed)
? ? ?07/26/2021 ?[name] [name] ?[id number] ?November 26, 1940 ? ? ?ASSESSMENT AND PLAN:  ? ?LLQ pain ?Irritable bowel syndrome with both constipation and diarrhea ?Will add on fiber to help form, given information ?Possible component of pelvic floor dysfunction, will proceed with colonoscopy, if negative can consider anal manometry/CT scan ? ?Dyspepsia ?Has moderate hiatal hernia, on Prilosec 20 mg BID ?Sounds like could be from H/H but possible gastritis on CT ?Will schedule for EGD ?I discussed risks of EGD with patient today, including risk of sedation, bleeding or perforation.  ?Patient provides understanding and gave verbal consent to proceed. ? ? ?History of Present Illness:  ?81 y.o. 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 Kathyrn Sheriff, hiatal hernia, GERD and diverticulitis. Past cholecystectomy, and others listed below, known to Dr. Ardis Hughs returns to clinic today for evaluation of left upper quadrant AB pain. ? ?Nephew, sister's son, is with her and gives some of the history.  ?Patient was set up for colon/EGD to evaluate for AB pain and gastritis but had to be canceled due to delay of effient instructions per Dr. Ralene Ok.  ?She is no longer on effient and is able to get the colon/EGD, only seeing neuro once a year for residual small aneurysm that is being monitored.  ?She has AB fullness, with AB pressure, constant for at least 6 months.  ?Denies nausea, vomiting. Use to have very bad reflux but she is on prilosec and this controls it.  ?She is in the bathroom for long periods, is in BM every day, has very messy/soft BM's that are to clean, no hematochezia.  ?No known family history of GI malignancy.  ?She has 2 sons, denies urinary incontinence.  ? ?Previous GI history: ?CTAP 07/04/2020: ?1. No acute process in the abdomen or pelvis. ?2. Moderate  hiatal hernia. Apparent wall thickening of the herniated ?stomach could be due to underdistention. Correlate with any symptoms ?to suggest gastritis. ?3. Pelvic floor laxity. ?4. Coronary artery atherosclerosis. Aortic Atherosclerosis ?  ?Esophageal manometry 07/16/2012: ?mild lower esophageal sphincter incompetency ?  ?EGD 08/13/2011: ?Large 6 to 7 cm hiatal hernia. ?Gastritis.  ?  ?EGD 07/06/2008: ?Normal esophagus. The esophagus was dilated with a 63F.Maloney dilator.  ?3cm Hiatal hernia. ?Normal stomach. ?Normal duodenum.  ?  ?Colonoscopy 06/29/2008 by Dr. Sharlett Iles: ?Diverticulosis descending and sigmoid colon ?  ? ? ?Current Medications:  ? ? ?Current Outpatient Medications (Cardiovascular):  ?  amLODipine (NORVASC) 5 MG tablet, Take 1 tablet (5 mg total) by mouth daily. ?  metoprolol tartrate (LOPRESSOR) 25 MG tablet, TAKE 1 TABLET BY MOUTH TWICE A DAY ?  rosuvastatin (CRESTOR) 40 MG tablet, Take 1 tablet (40 mg total) by mouth daily. ? ? ? ? ?Current Outpatient Medications (Other):  ?  DULoxetine (CYMBALTA) 60 MG capsule, Take 1 capsule (60 mg total) by mouth daily. Take with 20 mg per day cymbalta to total 80 mg per day ?  melatonin 5 MG TABS, Take 5 mg by mouth at bedtime. ?  omeprazole (PRILOSEC) 20 MG capsule, TAKE 1 CAPSULE BY MOUTH TWICE A DAY ?  Probiotic Product (ALIGN PO), Take 1 tablet by mouth daily.  ?  ramelteon (ROZEREM) 8 MG tablet, Take 1 tablet (8 mg total) by mouth at bedtime. For sleep. ?  DULoxetine (CYMBALTA) 20 MG capsule, Take 1 capsule (20 mg total) by mouth daily. In  addition to 60 mg per day to total 80 mg per day (Patient not taking: Reported on 07/26/2021) ? ?Surgical History:  ?She  has a past surgical history that includes Cholecystectomy; Total knee arthroplasty (Left); Cystectomy (2001); Bladder tumor excision (2001); Tendon repair (Right, 11/2005); Lumbar disc surgery; Cataract extraction; Esophageal manometry (N/A, 07/13/2012); Reverse shoulder arthroplasty (Right, 08/12/2014);  Total knee arthroplasty (Right, 11/17/2015); IR ANGIO INTRA EXTRACRAN SEL INTERNAL CAROTID BILAT MOD SED (11/02/2019); IR ANGIO VERTEBRAL SEL VERTEBRAL UNI R MOD SED (11/02/2019); Hiatal hernia repair; IR Transcath/Emboliz (12/17/2019); IR Angiogram Follow Up Study (12/17/2019); IR NEURO EACH ADD'L AFTER BASIC UNI LEFT (MS) (12/17/2019); IR Angiogram Follow Up Study (12/17/2019); IR Angiogram Follow Up Study (12/17/2019); IR ANGIO VERTEBRAL SEL VERTEBRAL BILAT MOD SED (12/17/2019); IR Angiogram Follow Up Study (12/17/2019); Radiology with anesthesia (N/A, 12/17/2019); IR ANGIO INTRA EXTRACRAN SEL INTERNAL CAROTID BILAT MOD SED (10/31/2020); and IR ANGIO VERTEBRAL SEL VERTEBRAL UNI L MOD SED (10/31/2020). ?Family History:  ?Her family history includes Alzheimer's disease in her mother; Arthritis in her daughter and son; Breast cancer in her cousin; Diabetes in her paternal grandmother and paternal uncle; Heart disease in her maternal aunt and maternal uncle; Hypertension in her brother and sister; Kidney cancer in her son; Lung cancer in her father; Osteoporosis in her sister; Stroke in her son. ?Social History:  ? reports that she has never smoked. She has never used smokeless tobacco. She reports that she does not currently use alcohol. She reports that she does not use drugs. ? ?Current Medications, Allergies, Past Medical History, Past Surgical History, Family History and Social History were reviewed in [employer] record. ? ?Physical Exam: ?BP (!) 94/54   Pulse 67   Ht '4\' 11"'$  (1.499 m)   Wt 143 lb (64.9 kg)   BMI 28.88 kg/m?  ?General:   Pleasant, well developed female in no acute distress ?Heart:  Regular rate and rhythm; extra systole rare, slight systolic murmur ?Pulm: Clear anteriorly; no wheezing ?Abdomen:  Soft, Obese AB, Active bowel sounds. mild tenderness in the LUQ. Without guarding and Without rebound, No organomegaly appreciated. ?Extremities:  with 2+ edema. ?Neurologic:  Alert and   oriented x4;  No focal deficits.  ?Psych:  Cooperative. Normal mood and affect. ? ? ?Vladimir Crofts, PA-C ?07/26/21 ?

## 2021-07-26 ENCOUNTER — Ambulatory Visit: Payer: PPO | Admitting: Physician Assistant

## 2021-07-26 ENCOUNTER — Encounter: Payer: Self-pay | Admitting: Physician Assistant

## 2021-07-26 VITALS — BP 94/54 | HR 67 | Ht 59.0 in | Wt 143.0 lb

## 2021-07-26 DIAGNOSIS — K582 Mixed irritable bowel syndrome: Secondary | ICD-10-CM

## 2021-07-26 DIAGNOSIS — R1032 Left lower quadrant pain: Secondary | ICD-10-CM

## 2021-07-26 DIAGNOSIS — R1013 Epigastric pain: Secondary | ICD-10-CM

## 2021-07-26 NOTE — Patient Instructions (Addendum)
?If you are age 81 or older, your body mass index should be between 23-30. Your Body mass index is 28.88 kg/m?Marland Kitchen If this is out of the aforementioned range listed, please consider follow up with your Primary Care Provider. ? ?If you are age 3 or younger, your body mass index should be between 19-25. Your Body mass index is 28.88 kg/m?Marland Kitchen If this is out of the aformentioned range listed, please consider follow up with your Primary Care Provider.  ? ?________________________________________________________ ? ?The [facility] providers would like to encourage you to use [facility] to communicate with providers for non-urgent requests or questions.  Due to long hold times on the telephone, sending your provider a message by [facility] may be a faster and more efficient way to get a response.  Please allow 48 business hours for a response.  Please remember that this is for non-urgent requests.  ?_______________________________________________________ ? ?You have been scheduled for an endoscopy. Please follow written instructions given to you at your visit today. ?If you use inhalers (even only as needed), please bring them with you on the day of your procedure. ? ?FIBER SUPPLEMENT ?You can do metamucil or fibercon once or twice a day but if this causes gas/bloating please switch to Benefiber or Citracel.  ?Fiber is good for constipation/diarrhea/irritable bowel syndrome.  ?It can also help with weight loss and can help lower your bad cholesterol.  ?Please do 1 TBSP in the morning in water, coffee, or tea. It can take up to a month before you can see a difference with your bowel movements.  ?It is cheapest from [employer], [employer], [employer].  ? ?Toileting tips  ?GET A STOOL OR SQUATTY POTTY-- Raising your feet with a step stool/squatty potty can be helpful to improve the angle that allows your stool to pass through the rectum. ? ?- Establish a time to try to move your bowels every day.  For many people, this is after a cup of coffee or  after a meal such as breakfast. ?- Sit all of the way back on the toilet keeping your back fairly straight and while sitting up, try to rest the tops of your forearms on your upper thighs.   ?- Relax the rectum feeling it bulge toward the toilet water.  If you feel your rectum raising toward your body, you are contracting rather than relaxing. ?- Breathe in and slowly exhale. "Belly breath" by expanding your belly towards your belly button. Keep belly expanded as you gently direct pressure down and back to the anus.  A low pitched GRRR sound can assist with increasing intra-abdominal pressure.  ?- Repeat 3-4 times. If unsuccessful, contract the pelvic floor to restore normal tone and get off the toilet.  Avoid excessive straining. ?- To reduce excessive wiping by teaching your anus to normally contract, place hands on outer aspect of knees and resist knee movement outward.  Hold 5-10 second then place hands just inside of knees and resist inward movement of knees.  Hold 5 seconds.  Repeat a few times each way. ? ?Please take your proton pump inhibitor medication 30 minutes to 1 hour before meals- this makes it more effective.  ?Avoid spicy and acidic foods ?Avoid fatty foods ?Limit your intake of coffee, tea, alcohol, and carbonated drinks ?Work to maintain a healthy weight ?Keep the head of the bed elevated at least 3 inches with blocks or a wedge pillow if you are having any nighttime symptoms ?Stay upright for 2 hours after eating ?Avoid  meals and snacks three to four hours before bedtime ? ?Gastroesophageal Reflux Disease, Adult ?Gastroesophageal reflux (GER) happens when acid from the stomach flows up into the tube that connects the mouth and the stomach (esophagus). Normally, food travels down the esophagus and stays in the stomach to be digested. However, when a person has GER, food and stomach acid sometimes move back up into the esophagus. If this becomes a more serious problem, the person may be diagnosed  with a disease called gastroesophageal reflux disease (GERD). GERD occurs when the reflux: ?Happens often. ?Causes frequent or severe symptoms. ?Causes problems such as damage to the esophagus. ?When stomach acid comes in contact with the esophagus, the acid may cause inflammation in the esophagus. Over time, GERD may create small holes (ulcers) in the lining of the esophagus. ?What are the causes? ?This condition is caused by a problem with the muscle between the esophagus and the stomach (lower esophageal sphincter, or LES). Normally, the LES muscle closes after food passes through the esophagus to the stomach. When the LES is weakened or abnormal, it does not close properly, and that allows food and stomach acid to go back up into the esophagus. ?The LES can be weakened by certain dietary substances, medicines, and medical conditions, including: ?Tobacco use. ?Pregnancy. ?Having a hiatal hernia. ?Alcohol use. ?Certain foods and beverages, such as coffee, chocolate, onions, and peppermint. ?What increases the risk? ?You are more likely to develop this condition if you: ?Have an increased body weight. ?Have a connective tissue disorder. ?Take NSAIDs, such as ibuprofen. ?What are the signs or symptoms? ?Symptoms of this condition include: ?Heartburn. ?Difficult or painful swallowing and the feeling of having a lump in the throat. ?A bitter taste in the mouth. ?Bad breath and having a large amount of saliva. ?Having an upset or bloated stomach and belching. ?Chest pain. Different conditions can cause chest pain. Make sure you see your health care provider if you experience chest pain. ?Shortness of breath or wheezing. ?Ongoing (chronic) cough or a nighttime cough. ?Wearing away of tooth enamel. ?Weight loss. ?How is this diagnosed? ?This condition may be diagnosed based on a medical history and a physical exam. To determine if you have mild or severe GERD, your health care provider may also monitor how you respond to  treatment. You may also have tests, including: ?A test to examine your stomach and esophagus with a small camera (endoscopy). ?A test that measures the acidity level in your esophagus. ?A test that measures how much pressure is on your esophagus. ?A barium swallow or modified barium swallow test to show the shape, size, and functioning of your esophagus. ?How is this treated? ?Treatment for this condition may vary depending on how severe your symptoms are. Your health care provider may recommend: ?Changes to your diet. ?Medicine. ?Surgery. ?The goal of treatment is to help relieve your symptoms and to prevent complications. ?Follow these instructions at home: ?Eating and drinking ? ?Follow a diet as recommended by your health care provider. This may involve avoiding foods and drinks such as: ?Coffee and tea, with or without caffeine. ?Drinks that contain alcohol. ?Energy drinks and sports drinks. ?Carbonated drinks or sodas. ?Chocolate and cocoa. ?Peppermint and mint flavorings. ?Garlic and onions. ?Horseradish. ?Spicy and acidic foods, including peppers, chili powder, curry powder, vinegar, hot sauces, and barbecue sauce. ?Citrus fruit juices and citrus fruits, such as oranges, lemons, and limes. ?Tomato-based foods, such as red sauce, chili, salsa, and pizza with red sauce. ?Fried and fatty  foods, such as donuts, french fries, potato chips, and high-fat dressings. ?High-fat meats, such as hot dogs and fatty cuts of red and white meats, such as rib eye steak, sausage, ham, and bacon. ?High-fat dairy items, such as whole milk, butter, and cream cheese. ?Eat small, frequent meals instead of large meals. ?Avoid drinking large amounts of liquid with your meals. ?Avoid eating meals during the 2-3 hours before bedtime. ?Avoid lying down right after you eat. ?Do not exercise right after you eat. ?Lifestyle ? ?Do not use any products that contain nicotine or tobacco. These products include cigarettes, chewing tobacco, and  vaping devices, such as e-cigarettes. If you need help quitting, ask your health care provider. ?Try to reduce your stress by using methods such as yoga or meditation. If you need help reducing stres

## 2021-07-29 NOTE — Progress Notes (Signed)
I agree with the above note, plan 

## 2021-07-31 DIAGNOSIS — L538 Other specified erythematous conditions: Secondary | ICD-10-CM | POA: Diagnosis not present

## 2021-07-31 DIAGNOSIS — L298 Other pruritus: Secondary | ICD-10-CM | POA: Diagnosis not present

## 2021-07-31 DIAGNOSIS — L728 Other follicular cysts of the skin and subcutaneous tissue: Secondary | ICD-10-CM | POA: Diagnosis not present

## 2021-07-31 DIAGNOSIS — R208 Other disturbances of skin sensation: Secondary | ICD-10-CM | POA: Diagnosis not present

## 2021-08-02 ENCOUNTER — Other Ambulatory Visit: Payer: Self-pay | Admitting: Internal Medicine

## 2021-08-17 ENCOUNTER — Other Ambulatory Visit: Payer: Self-pay | Admitting: Internal Medicine

## 2021-08-17 NOTE — Telephone Encounter (Signed)
Last Ov 06/04/21 ?Filled 06/04/21 ?Is it ok to refill? ?

## 2021-08-22 ENCOUNTER — Other Ambulatory Visit: Payer: Self-pay | Admitting: Internal Medicine

## 2021-08-28 ENCOUNTER — Telehealth: Payer: Self-pay | Admitting: Internal Medicine

## 2021-08-28 NOTE — Telephone Encounter (Signed)
Called patient to discuss medication and patient is confused as to what she is suppose to take she has old Rx with different instructions and doesn't know why the dose was lowered.  I informed patient of the new instructions.  Patient does have appt scheduled 09/03/21 @ 2pm asked patient to bring Rx to appt. ? ?

## 2021-08-28 NOTE — Telephone Encounter (Signed)
Pt confused as to how she should be taking her DULoxetine (CYMBALTA) 20 MG capsule. Also taking DULoxetine (CYMBALTA) 60 MG capsule Seeking advise on what her dose should be.  ?

## 2021-09-03 ENCOUNTER — Ambulatory Visit: Payer: PPO | Admitting: Internal Medicine

## 2021-09-03 ENCOUNTER — Encounter: Payer: Self-pay | Admitting: Internal Medicine

## 2021-09-03 ENCOUNTER — Ambulatory Visit (INDEPENDENT_AMBULATORY_CARE_PROVIDER_SITE_OTHER): Payer: PPO | Admitting: Internal Medicine

## 2021-09-03 VITALS — BP 126/74 | HR 97 | Temp 98.6°F | Ht 59.0 in | Wt 133.6 lb

## 2021-09-03 DIAGNOSIS — N183 Chronic kidney disease, stage 3 unspecified: Secondary | ICD-10-CM

## 2021-09-03 DIAGNOSIS — F419 Anxiety disorder, unspecified: Secondary | ICD-10-CM

## 2021-09-03 DIAGNOSIS — M159 Polyosteoarthritis, unspecified: Secondary | ICD-10-CM | POA: Diagnosis not present

## 2021-09-03 DIAGNOSIS — Z79899 Other long term (current) drug therapy: Secondary | ICD-10-CM

## 2021-09-03 MED ORDER — BELSOMRA 10 MG PO TABS: 10.0000 mg | ORAL_TABLET | Freq: Every evening | ORAL | 0 refills | Status: DC

## 2021-09-03 NOTE — Patient Instructions (Signed)
Can try 500 mg  to 650 total at night to see if helsp pain enough to sleep ? ?Stay on cymbalta  60 + 20 mg per day ? ?I sent in a different sleep med to try but will see if too expensive or if helps  may need to use for week or so  ? ? ?

## 2021-09-03 NOTE — Progress Notes (Signed)
? ?Chief Complaint  ?Patient presents with  ? Follow-up  ?  Discuss sleep and medication dosing  ? ? ?HPI: ?[name] [name] 81 y.o. come in for Chronic disease management here with daughter again. ?Last visit 2 23 inc  cymbalta to 80 mg to see if would help pain and anxiety  ?There was some confusion about what dosing she was on see messages bottle says 2 x 60 mg a day but she had told that she was only taking 1 pill a day so we were increasing dose slowly. ?Her previous crying all the time apparently has improved and she feels somewhat better. ? ?Rozerem for sleep was very expensive and although she slept she was very drowsy the next day.  We will asking for help with sleep ?In regard to memory daughter is somewhat frustrated about her not using a calendar and she has to remind her the details of any visit but patient remembers that she has a visit. ?If she is a concern about date and time she checks her phone.  But does not plan ahead very much ?She is social and enjoys going out but lives alone sometimes that makes her down.  Does not avoid social interaction ? ?Sometimes pain in middle of the night can she take Tylenol. ?She is aware she should not take an anti-inflammatory. ? ?Patient's mother had Alzheimer's. ? ? ?ROS: See pertinent positives and negatives per HPI. ? ?Past Medical History:  ?Diagnosis Date  ? Acute kidney failure ([place])   ? 5 years ago  ? ACUTE KIDNEY FAILURE UNSPECIFIED 07/12/2009  ? Allergy   ? Anemia   ? Anxiety   ? Arthritis   ? Basal cell carcinoma   ? Blood clot in vein   ? states it was not deep vein  ? CAD (coronary artery disease)   ? Mild nonobstructive by CT  ? Cataract   ? Cerebral aneurysm without rupture 2021  ? Chronic kidney disease   ? stones  ? Depression   ? around the time of her husband's death  ? Diverticulosis of colon (without mention of hemorrhage)   ? Fibromyalgia   ? GERD (gastroesophageal reflux disease)   ? Headache   ? Hiatal hernia   ? History of kidney stones   ?  Hyperlipidemia   ? Hypertension   ? Interstitial cystitis   ? Kidney stone   ? Osteopenia   ? PONV (postoperative nausea and vomiting)   ? Scoliosis   ? 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 Qualifier: Diagnosis of  By: Sharlett Iles MD [name]   ? Vertigo   ? ? ?Family History  ?Problem Relation Age of Onset  ? Alzheimer's disease Mother   ? Lung cancer Father   ?     tobacco  ? Kidney cancer Son   ? Stroke Son   ? Arthritis Son   ? Hypertension Brother   ?     mom's side  ? Osteoporosis Sister   ? Hypertension Sister   ? Breast cancer Cousin   ? Heart disease Maternal Uncle   ?     x 4  ? Heart disease Maternal Aunt   ?     x 4  ? Diabetes Paternal Grandmother   ? Diabetes Paternal Uncle   ?     x 2  ? Arthritis Daughter   ? Colon cancer Neg Hx   ? ? ?Social History  ? ?Socioeconomic History  ? Marital status: Widowed  ?  Spouse name: Not on file  ? Number of children: 2  ? Years of education: Not on file  ? Highest education level: Not on file  ?Occupational History  ? Occupation: Retired   ?Tobacco Use  ? Smoking status: Never  ? Smokeless tobacco: Never  ?Vaping Use  ? Vaping Use: Never used  ?Substance and Sexual Activity  ? Alcohol use: Not Currently  ?  Alcohol/week: 0.0 standard drinks  ?  Comment: rare  ? Drug use: No  ? Sexual activity: Not on file  ?Other Topics Concern  ? Not on file  ?Social History Narrative  ? Widowed 10/09 husband scd  ? 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 Daily caffeine use.  ? Right handed  ? ?Social Determinants of Health  ? ?Financial Resource Strain: Low Risk   ? Difficulty of Paying Living Expenses: Not hard at all  ?Food Insecurity: No Food Insecurity  ? Worried About Charity fundraiser in the Last Year: Never true  ? Ran Out of Food in the Last Year: Never true  ?Transportation Needs: No Transportation Needs  ? Lack of Transportation (Medical): No  ? Lack of Transportation (Non-Medical): No  ?Physical Activity: Inactive  ? Days of Exercise per Week: 0 days  ? Minutes of Exercise  per Session: 0 min  ?Stress: No Stress Concern Present  ? Feeling of Stress : Not at all  ?Social Connections: Moderately Integrated  ? Frequency of Communication with Friends and Family: More than three times a week  ? Frequency of Social Gatherings with Friends and Family: More than three times a week  ? Attends Religious Services: 1 to 4 times per year  ? Active Member of Clubs or Organizations: Yes  ? Attends Archivist Meetings: 1 to 4 times per year  ? Marital Status: Widowed  ? ? ?Outpatient Medications Prior to Visit  ?Medication Sig Dispense Refill  ? amLODipine (NORVASC) 5 MG tablet Take 1 tablet (5 mg total) by mouth daily. 90 tablet 0  ? DULoxetine (CYMBALTA) 20 MG capsule TAKE 1 CAPSULE (20 MG TOTAL) BY MOUTH DAILY. IN ADDITION TO 60 MG PER DAY TO TOTAL 80 MG PER DAY 90 capsule 2  ? DULoxetine (CYMBALTA) 60 MG capsule Take 1 capsule (60 mg total) by mouth daily. Take with 20 mg per day cymbalta to total 80 mg per day 90 capsule 1  ? melatonin 5 MG TABS Take 5 mg by mouth at bedtime.    ? metoprolol tartrate (LOPRESSOR) 25 MG tablet TAKE 1 TABLET BY MOUTH TWICE A DAY 180 tablet 0  ? omeprazole (PRILOSEC) 20 MG capsule TAKE 1 CAPSULE BY MOUTH TWICE A DAY 180 capsule 0  ? Probiotic Product (ALIGN PO) Take 1 tablet by mouth daily.     ? rosuvastatin (CRESTOR) 40 MG tablet Take 1 tablet (40 mg total) by mouth daily. 90 tablet 0  ? ramelteon (ROZEREM) 8 MG tablet TAKE 1 TABLET (8 MG TOTAL) BY MOUTH AT BEDTIME. FOR SLEEP. (Patient not taking: Reported on 09/03/2021) 90 tablet 1  ? ?No facility-administered medications prior to visit.  ? ? ? ?EXAM: ? ?BP 126/74 (BP Location: Left Arm, Patient Position: Sitting, Cuff Size: Normal)   Pulse 97   Temp 98.6 ?F (37 ?C) (Oral)   Ht '4\' 11"'$  (1.499 m)   Wt 133 lb 9.6 oz (60.6 kg)   SpO2 96%   BMI 26.98 kg/m?  ? ?Body mass index is 26.98 kg/m?. ? ?GENERAL: vitals reviewed  and listed above, alert, oriented, appears well hydrated and in no acute  distress ?HEENT: atraumatic, conjunctiva  clear, no obvious abnormalities on inspection of external nose and ears NECK: no obvious masses on inspection palpation  ?MS: moves all extremities without noticeable focal  abnormality no acute swelling but arthritic changes.  Ambulatory independent. ?PSYCH: pleasant and cooperative, no obvious depression or anxiety ?Lab Results  ?Component Value Date  ? WBC 4.0 05/26/2021  ? HGB 13.4 05/26/2021  ? HCT 41.7 05/26/2021  ? PLT 170 05/26/2021  ? GLUCOSE 111 (H) 05/26/2021  ? CHOL 143 09/01/2019  ? TRIG 69.0 09/01/2019  ? HDL 61.80 09/01/2019  ? LDLDIRECT 179.1 10/09/2007  ? [place] 67 09/01/2019  ? ALT 9 06/29/2020  ? AST 15 06/29/2020  ? NA 138 05/26/2021  ? K 4.0 05/26/2021  ? CL 105 05/26/2021  ? CREATININE 1.05 (H) 05/26/2021  ? BUN 19 05/26/2021  ? CO2 27 05/26/2021  ? TSH 2.58 09/01/2019  ? INR 1.0 10/31/2020  ? HGBA1C 6.0 09/01/2019  ? ?BP Readings from Last 3 Encounters:  ?09/03/21 126/74  ?07/26/21 (!) 94/54  ?06/04/21 136/86  ? ? ?ASSESSMENT AND PLAN: ? ?Discussed the following assessment and plan: ? ?Medication management ? ?Anxiety w depression - Appears improved on the 80 mg however we discussed the confusion about what was written on the bottle and what the plan was ? ?Osteoarthritis of multiple joints, unspecified osteoarthritis type ? ?Stage 3 chronic kidney disease, unspecified whether stage 3a or 3b CKD ([place]) ?At this point will remain on the 80 mg of Cymbalta ?Had drowsiness side effect of the expensive Rozerem.  Can try Belsomra 10 mg uncertain of cost etc. ?Plan follow-up in about 4 months unless new problems arise she will let us know about the sleep not optimistic that we will have an easy answer. ?Mickel Baas is avoiding a serotonin medicines because of the higher dose Cymbalta. ? ?It should be safe for her to take Tylenol at night for pain uncertain why Tylenol is on her list for acute renal failure.  She knows not to take any NSAIDs. ? ?Disc memory ideas to  help with family communication.  ?-Patient advised to return or notify health care team  if  new concerns arise. ?35 minutes review  counsel appt  manage plan about meds and   techniques going forward.  ?Patient Instruc

## 2021-09-11 ENCOUNTER — Encounter: Payer: Self-pay | Admitting: Gastroenterology

## 2021-09-11 ENCOUNTER — Ambulatory Visit (AMBULATORY_SURGERY_CENTER): Payer: PPO | Admitting: Gastroenterology

## 2021-09-11 VITALS — BP 123/57 | HR 78 | Temp 97.5°F | Resp 18 | Ht 59.0 in | Wt 143.0 lb

## 2021-09-11 DIAGNOSIS — D122 Benign neoplasm of ascending colon: Secondary | ICD-10-CM

## 2021-09-11 DIAGNOSIS — K2970 Gastritis, unspecified, without bleeding: Secondary | ICD-10-CM | POA: Diagnosis not present

## 2021-09-11 DIAGNOSIS — D120 Benign neoplasm of cecum: Secondary | ICD-10-CM | POA: Diagnosis not present

## 2021-09-11 DIAGNOSIS — R1032 Left lower quadrant pain: Secondary | ICD-10-CM | POA: Diagnosis not present

## 2021-09-11 DIAGNOSIS — D123 Benign neoplasm of transverse colon: Secondary | ICD-10-CM

## 2021-09-11 DIAGNOSIS — K648 Other hemorrhoids: Secondary | ICD-10-CM | POA: Diagnosis not present

## 2021-09-11 DIAGNOSIS — K2950 Unspecified chronic gastritis without bleeding: Secondary | ICD-10-CM | POA: Diagnosis not present

## 2021-09-11 DIAGNOSIS — R1013 Epigastric pain: Secondary | ICD-10-CM | POA: Diagnosis not present

## 2021-09-11 DIAGNOSIS — I10 Essential (primary) hypertension: Secondary | ICD-10-CM | POA: Diagnosis not present

## 2021-09-11 DIAGNOSIS — K5730 Diverticulosis of large intestine without perforation or abscess without bleeding: Secondary | ICD-10-CM

## 2021-09-11 DIAGNOSIS — N183 Chronic kidney disease, stage 3 unspecified: Secondary | ICD-10-CM | POA: Diagnosis not present

## 2021-09-11 DIAGNOSIS — K582 Mixed irritable bowel syndrome: Secondary | ICD-10-CM

## 2021-09-11 MED ORDER — SODIUM CHLORIDE 0.9 % IV SOLN: 500.0000 mL | Freq: Once | INTRAVENOUS | Status: DC

## 2021-09-11 NOTE — Op Note (Signed)
[facility] Patient Name: [name] [name] Procedure Date: 09/11/2021 7:59 AM MRN: [id number] Endoscopist: Milus Banister , MD Age: 81 Referring MD:  Date of Birth: 30-Aug-1940 Gender: Female Account #: [id number] Procedure:                Upper GI endoscopy Indications:              Dyspepsia Medicines:                Monitored Anesthesia Care Procedure:                Pre-Anesthesia Assessment:                           - Prior to the procedure, a History and Physical                            was performed, and patient medications and                            allergies were reviewed. The patient's tolerance of                            previous anesthesia was also reviewed. The risks                            and benefits of the procedure and the sedation                            options and risks were discussed with the patient.                            All questions were answered, and informed consent                            was obtained. Prior Anticoagulants: The patient has                            taken no previous anticoagulant or antiplatelet                            agents. ASA Grade Assessment: III - A patient with                            severe systemic disease. After reviewing the risks                            and benefits, the patient was deemed in                            satisfactory condition to undergo the procedure.                           After obtaining informed consent, the endoscope was  passed under direct vision. Throughout the                            procedure, the patient's blood pressure, pulse, and                            oxygen saturations were monitored continuously. The                            GIF [id number] #[id number] was introduced through the                            mouth, and advanced to the second part of duodenum.                            The upper GI endoscopy was accomplished  without                            difficulty. The patient tolerated the procedure                            well. Scope In: Scope Out: Findings:                 Mild inflammation characterized by erythema and                            friability was found in the gastric antrum.                            Biopsies were taken with a cold forceps for                            histology.                           A 7 cm hiatal hernia was noted, causing typical                            tourtous and foreshortened esophagus. No Cameron's                            type erosions.                           The exam was otherwise without abnormality. Complications:            No immediate complications. Estimated blood loss:                            None. Estimated Blood Loss:     Estimated blood loss: none. Impression:               - Mild, non-specific distal gastritis. Biopsied to                            check for  H. pylori.                           - A 7 cm hiatal hernia was noted, causing typical                            tourtous and foreshortened esophagus. No Cameron's                            type erosions.                           - The examination was otherwise normal. Recommendation:           - Patient has a contact number available for                            emergencies. The signs and symptoms of potential                            delayed complications were discussed with the                            patient. Return to normal activities tomorrow.                            Written discharge instructions were provided to the                            patient.                           - Resume previous diet.                           - Continue present medications.                           - Await pathology results. Milus Banister, MD 09/11/2021 8:49:48 AM This report has been signed electronically.

## 2021-09-11 NOTE — Progress Notes (Signed)
VS completed by CW.  Medical history reviewed and updated.

## 2021-09-11 NOTE — Patient Instructions (Signed)
Please read handouts provided. Continue present medications. Await pathology results. Clip Card.   YOU HAD AN ENDOSCOPIC PROCEDURE TODAY AT [facility]:   Refer to the procedure report that was given to you for any specific questions about what was found during the examination.  If the procedure report does not answer your questions, please call your gastroenterologist to clarify.  If you requested that your care partner not be given the details of your procedure findings, then the procedure report has been included in a sealed envelope for you to review at your convenience later.  YOU SHOULD EXPECT: Some feelings of bloating in the abdomen. Passage of more gas than usual.  Walking can help get rid of the air that was put into your GI tract during the procedure and reduce the bloating. If you had a lower endoscopy (such as a colonoscopy or flexible sigmoidoscopy) you may notice spotting of blood in your stool or on the toilet paper. If you underwent a bowel prep for your procedure, you may not have a normal bowel movement for a few days.  Please Note:  You might notice some irritation and congestion in your nose or some drainage.  This is from the oxygen used during your procedure.  There is no need for concern and it should clear up in a day or so.  SYMPTOMS TO REPORT IMMEDIATELY:  Following lower endoscopy (colonoscopy or flexible sigmoidoscopy):  Excessive amounts of blood in the stool  Significant tenderness or worsening of abdominal pains  Swelling of the abdomen that is new, acute  Fever of 100F or higher  Following upper endoscopy (EGD)  Vomiting of blood or coffee ground material  New chest pain or pain under the shoulder blades  Painful or persistently difficult swallowing  New shortness of breath  Fever of 100F or higher  Black, tarry-looking stools  For urgent or emergent issues, a gastroenterologist can be reached at any hour by calling [phone]. Do  not use MyChart messaging for urgent concerns.    DIET:  We do recommend a small meal at first, but then you may proceed to your regular diet.  Drink plenty of fluids but you should avoid alcoholic beverages for 24 hours.  ACTIVITY:  You should plan to take it easy for the rest of today and you should NOT DRIVE or use heavy machinery until tomorrow (because of the sedation medicines used during the test).    FOLLOW UP: Our staff will call the number listed on your records 48-72 hours following your procedure to check on you and address any questions or concerns that you may have regarding the information given to you following your procedure. If we do not reach you, we will leave a message.  We will attempt to reach you two times.  During this call, we will ask if you have developed any symptoms of COVID 19. If you develop any symptoms (ie: fever, flu-like symptoms, shortness of breath, cough etc.) before then, please call [phone].  If you test positive for Covid 19 in the 2 weeks post procedure, please call and report this information to [place].    If any biopsies were taken you will be contacted by phone or by letter within the next 1-3 weeks.  Please call us at [phone] if you have not heard about the biopsies in 3 weeks.    SIGNATURES/CONFIDENTIALITY: You and/or your care partner have signed paperwork which will be entered into your electronic medical record.  These signatures attest to the fact that that the information above on your After Visit Summary has been reviewed and is understood.  Full responsibility of the confidentiality of this discharge information lies with you and/or your care-partner.  

## 2021-09-11 NOTE — Progress Notes (Signed)
Called to room to assist during endoscopic procedure.  Patient ID and intended procedure confirmed with present staff. Received instructions for my participation in the procedure from the performing physician.  

## 2021-09-11 NOTE — Op Note (Signed)
[facility] Patient Name: [name] [name] Procedure Date: 09/11/2021 8:00 AM MRN: [id number] Endoscopist: Milus Banister , MD Age: 81 Referring MD:  Date of Birth: 07-14-1940 Gender: Female Account #: [id number] Procedure:                Colonoscopy Indications:              Abdominal pain in the left lower quadrant since                            traumatic fall about a year ago Medicines:                Monitored Anesthesia Care Procedure:                Pre-Anesthesia Assessment:                           - Prior to the procedure, a History and Physical                            was performed, and patient medications and                            allergies were reviewed. The patient's tolerance of                            previous anesthesia was also reviewed. The risks                            and benefits of the procedure and the sedation                            options and risks were discussed with the patient.                            All questions were answered, and informed consent                            was obtained. Prior Anticoagulants: The patient has                            taken no previous anticoagulant or antiplatelet                            agents. ASA Grade Assessment: III - A patient with                            severe systemic disease. After reviewing the risks                            and benefits, the patient was deemed in                            satisfactory condition to undergo the procedure.  After obtaining informed consent, the colonoscope                            was passed under direct vision. Throughout the                            procedure, the patient's blood pressure, pulse, and                            oxygen saturations were monitored continuously. The                            CF [id number] #[id number] was introduced through the anus                            and advanced to the the  cecum, identified by                            appendiceal orifice and ileocecal valve. The                            colonoscopy was performed without difficulty. The                            patient tolerated the procedure well. The quality                            of the bowel preparation was good. The ileocecal                            valve, appendiceal orifice, and rectum were                            photographed. Scope In: 8:10:48 AM Scope Out: 8:34:25 AM Scope Withdrawal Time: 0 hours 19 minutes 19 seconds  Total Procedure Duration: 0 hours 23 minutes 37 seconds  Findings:                 Five sessile polyps were found in the ascending                            colon and cecum. The polyps were 2 to 12 mm in                            size. These polyps were removed with a cold snare.                            Resection and retrieval were complete. jar 1. The                            largest polyp, in the cecum, bled more than usual                            after  resection and required placement of a single                            endoclip with immediate hemostasis.                           A 24 mm polyp was found in the ascending colon. The                            polyp was semi-pedunculated. The polyp was removed                            with a hot snare. Resection and retrieval were                            complete. jar 2                           Three sessile polyps were found in the transverse                            colon. The polyps were 3 to 7 mm in size. These                            polyps were removed with a cold snare. Resection                            and retrieval were complete. jar 3                           Multiple small and large-mouthed diverticula were                            found in the entire colon.                           Internal hemorrhoids were found. The hemorrhoids                            were small.                            The exam was otherwise without abnormality on                            direct and retroflexion views. Complications:            No immediate complications. Estimated blood loss:                            None. Estimated Blood Loss:     Estimated blood loss: none. Impression:               - Five 2 to 12 mm polyps in the ascending colon and  in the cecum, removed with a cold snare. Resected                            and retrieved. The largest of these polyp, in the                            cecum, bled more than usual after resection and                            required placement of a single endoclip with                            immediate hemostasis.                           - One 24 mm polyp in the ascending colon, removed                            with a hot snare. Resected and retrieved.                           - Three 3 to 7 mm polyps in the transverse colon,                            removed with a cold snare. Resected and retrieved.                           - Diverticulosis in the entire examined colon.                           - Internal hemorrhoids.                           - The examination was otherwise normal on direct                            and retroflexion views. Recommendation:           - Await pathology results.                           - EGD now. Milus Banister, MD 09/11/2021 8:39:47 AM This report has been signed electronically.

## 2021-09-11 NOTE — Progress Notes (Signed)
Report to PACU, RN, vss, BBS= Clear.  

## 2021-09-11 NOTE — Progress Notes (Signed)
HPI: This is a woman with left lower quadrant pain, constipation, diarrhea, dyspepsia   ROS: complete GI ROS as described in HPI, all other review negative.  Constitutional:  No unintentional weight loss   Past Medical History:  Diagnosis Date   Acute kidney failure ([place])    5 years ago   ACUTE KIDNEY FAILURE UNSPECIFIED 07/12/2009   Allergy    Anemia    Anxiety    Arthritis    Basal cell carcinoma    Blood clot in vein    states it was not deep vein   CAD (coronary artery disease)    Mild nonobstructive by CT   Cataract    Cerebral aneurysm without rupture 2021   Chronic kidney disease    stones   Depression    around the time of her husband's death   Diverticulosis of colon (without mention of hemorrhage)    Fibromyalgia    GERD (gastroesophageal reflux disease)    Headache    Hiatal hernia    History of kidney stones    Hyperlipidemia    Hypertension    Interstitial cystitis    Kidney stone    Osteopenia    PONV (postoperative nausea and vomiting)    Scoliosis    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   Qualifier: Diagnosis of  By: Sharlett Iles MD [name]    Vertigo     Past Surgical History:  Procedure Laterality Date   BLADDER TUMOR EXCISION  2001   CATARACT EXTRACTION     bilateral   CHOLECYSTECTOMY     CYSTECTOMY  2001   benign   ESOPHAGEAL MANOMETRY N/A 07/13/2012   Procedure: ESOPHAGEAL MANOMETRY (EM);  Surgeon: Sable Feil, MD;  Location: [facility];  Service: Endoscopy;  Laterality: N/A;   HIATAL HERNIA REPAIR     IR ANGIO INTRA EXTRACRAN SEL INTERNAL CAROTID BILAT MOD SED  11/02/2019   IR ANGIO INTRA EXTRACRAN SEL INTERNAL CAROTID BILAT MOD SED  10/31/2020   IR ANGIO VERTEBRAL SEL VERTEBRAL BILAT MOD SED  12/17/2019   IR ANGIO VERTEBRAL SEL VERTEBRAL UNI L MOD SED  10/31/2020   IR ANGIO VERTEBRAL SEL VERTEBRAL UNI R MOD SED  11/02/2019   IR ANGIOGRAM FOLLOW UP STUDY  12/17/2019   IR ANGIOGRAM FOLLOW UP STUDY  12/17/2019   IR ANGIOGRAM FOLLOW UP  STUDY  12/17/2019   IR ANGIOGRAM FOLLOW UP STUDY  12/17/2019   IR NEURO EACH ADD'L AFTER BASIC UNI LEFT (MS)  12/17/2019   IR TRANSCATH/EMBOLIZ  12/17/2019   LUMBAR DISC SURGERY     x 2 [facility]ner   RADIOLOGY WITH ANESTHESIA N/A 12/17/2019   Procedure: 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;  Surgeon: Consuella Lose, MD;  Location: [facility];  Service: Radiology;  Laterality: N/A;   REVERSE SHOULDER ARTHROPLASTY Right 08/12/2014   Procedure: RIGHT REVERSE TOTAL SHOULDER ARTHROPLASTY;  Surgeon: Netta Cedars, MD;  Location: [facility];  Service: Orthopedics;  Laterality: Right;   TENDON REPAIR Right 11/2005   right shoulder   TOTAL KNEE ARTHROPLASTY Left    left   TOTAL KNEE ARTHROPLASTY Right 11/17/2015   Procedure: RIGHT TOTAL KNEE ARTHROPLASTY;  Surgeon: Sydnee Cabal, MD;  Location: [facility];  Service: Orthopedics;  Laterality: Right;    Current Outpatient Medications  Medication Sig Dispense Refill   amLODipine (NORVASC) 5 MG tablet Take 1 tablet (5 mg total) by mouth daily. 90 tablet 0   DULoxetine (CYMBALTA) 20 MG capsule TAKE 1 CAPSULE (20 MG TOTAL) BY  MOUTH DAILY. IN ADDITION TO 60 MG PER DAY TO TOTAL 80 MG PER DAY 90 capsule 2   DULoxetine (CYMBALTA) 60 MG capsule Take 1 capsule (60 mg total) by mouth daily. Take with 20 mg per day cymbalta to total 80 mg per day 90 capsule 1   melatonin 5 MG TABS Take 5 mg by mouth at bedtime.     metoprolol tartrate (LOPRESSOR) 25 MG tablet TAKE 1 TABLET BY MOUTH TWICE A DAY 180 tablet 0   omeprazole (PRILOSEC) 20 MG capsule TAKE 1 CAPSULE BY MOUTH TWICE A DAY 180 capsule 0   Probiotic Product (ALIGN PO) Take 1 tablet by mouth daily.      rosuvastatin (CRESTOR) 40 MG tablet Take 1 tablet (40 mg total) by mouth daily. 90 tablet 0   Suvorexant (BELSOMRA) 10 MG TABS Take 10 mg by mouth at bedtime. For sleep 30 tablet 0   Current Facility-Administered Medications  Medication Dose Route Frequency Provider Last Rate Last Admin   0.9 %  sodium  chloride infusion  500 mL Intravenous Once Milus Banister, MD        Allergies as of 09/11/2021 - Review Complete 09/03/2021  Allergen Reaction Noted   Gadolinium derivatives Hives 10/07/2019   Aspirin Other (See Comments) 11/10/2015   Lactose intolerance (gi) Other (See Comments) 03/31/2012   Meloxicam  07/26/2009   Metoprolol  06/19/2020   Sulfamethoxazole Hives 02/11/2005    Family History  Problem Relation Age of Onset   Alzheimer's disease Mother    Lung cancer Father        tobacco   Kidney cancer Son    Stroke Son    Arthritis Son    Hypertension Brother        mom's side   Osteoporosis Sister    Hypertension Sister    Breast cancer Cousin    Heart disease Maternal Uncle        x 4   Heart disease Maternal Aunt        x 4   Diabetes Paternal Grandmother    Diabetes Paternal Uncle        x 2   Arthritis Daughter    Colon cancer Neg Hx     Social History   Socioeconomic History   Marital status: Widowed    Spouse name: Not on file   Number of children: 2   Years of education: Not on file   Highest education level: Not on file  Occupational History   Occupation: Retired   Tobacco Use   Smoking status: Never   Smokeless tobacco: Never  Vaping Use   Vaping Use: Never used  Substance and Sexual Activity   Alcohol use: Not Currently    Alcohol/week: 0.0 standard drinks    Comment: rare   Drug use: No   Sexual activity: Not on file  Other Topics Concern   Not on file  Social History Narrative   Widowed 10/09 husband Austell retired   Daily caffeine use.   Right handed   Social Determinants of Health   Financial Resource Strain: Low Risk    Difficulty of Paying Living Expenses: Not hard at all  Food Insecurity: No Food Insecurity   Worried About Charity fundraiser in the Last Year: Never true   [place] in the Last Year: Never true  Transportation Needs: No Transportation Needs   Lack of Transportation (Medical): No    Lack of Transportation (  Non-Medical): No  Physical Activity: Inactive   Days of Exercise per Week: 0 days   Minutes of Exercise per Session: 0 min  Stress: No Stress Concern Present   Feeling of Stress : Not at all  Social Connections: Moderately Integrated   Frequency of Communication with Friends and Family: More than three times a week   Frequency of Social Gatherings with Friends and Family: More than three times a week   Attends Religious Services: 1 to 4 times per year   Active Member of [employer] or Organizations: Yes   Attends Archivist Meetings: 1 to 4 times per year   Marital Status: Widowed  Human resources officer Violence: Not At Risk   Fear of Current or Ex-Partner: No   Emotionally Abused: No   Physically Abused: No   Sexually Abused: No     Physical Exam:  Constitutional: generally well-appearing Psychiatric: alert and oriented x3 Lungs: CTA bilaterally Heart: no MCR  Assessment and plan: 81 y.o. female with left lower quadrant pain, constipation, diarrhea, dyspepsia  Upper endoscopy and colonoscopy today  Care is appropriate for the ambulatory setting.  Owens Loffler, MD [facility] 09/11/2021, 7:34 AM

## 2021-09-12 ENCOUNTER — Telehealth: Payer: Self-pay | Admitting: *Deleted

## 2021-09-12 NOTE — Telephone Encounter (Signed)
  Follow up Call-     09/11/2021    7:35 AM  Call back number  Post procedure Call Back phone  # [phone]  Permission to leave phone message Yes    No answer at 2nd attempt follow up phone call.  Left message on voicemail.

## 2021-09-16 ENCOUNTER — Other Ambulatory Visit: Payer: Self-pay | Admitting: Internal Medicine

## 2021-09-17 ENCOUNTER — Other Ambulatory Visit: Payer: Self-pay | Admitting: Internal Medicine

## 2021-09-18 ENCOUNTER — Other Ambulatory Visit: Payer: Self-pay

## 2021-09-18 MED ORDER — OMEPRAZOLE 40 MG PO CPDR: 40.0000 mg | DELAYED_RELEASE_CAPSULE | Freq: Two times a day (BID) | ORAL | 3 refills | Status: DC

## 2021-10-15 ENCOUNTER — Other Ambulatory Visit: Payer: Self-pay | Admitting: Neurosurgery

## 2021-10-15 DIAGNOSIS — I671 Cerebral aneurysm, nonruptured: Secondary | ICD-10-CM

## 2021-10-24 ENCOUNTER — Other Ambulatory Visit: Payer: Self-pay | Admitting: Internal Medicine

## 2021-12-04 ENCOUNTER — Ambulatory Visit: Payer: PPO | Admitting: Internal Medicine

## 2021-12-05 ENCOUNTER — Other Ambulatory Visit: Payer: Self-pay | Admitting: Internal Medicine

## 2021-12-08 ENCOUNTER — Other Ambulatory Visit: Payer: Self-pay | Admitting: Internal Medicine

## 2021-12-12 NOTE — Telephone Encounter (Signed)
Med list is confusiing ;please clarify  last rx was with dr Ardis Hughs team  as 40 bid?  Bu rx with 20 mg bid  under my name  . What is she actually taking now .  thanks

## 2022-02-19 ENCOUNTER — Telehealth: Payer: Self-pay | Admitting: *Deleted

## 2022-02-19 NOTE — Patient Outreach (Signed)
  Care Coordination   02/19/2022 Name: [name] [name] MRN: [id number] DOB: [birth year]   Care Coordination Outreach Attempts:  An unsuccessful telephone outreach was attempted today to offer the patient information about available care coordination services as a benefit of their health plan.   Follow Up Plan:  Additional outreach attempts will be made to offer the patient care coordination information and services.   Encounter Outcome:  No Answer  Care Coordination Interventions Activated:  No   Care Coordination Interventions:  No, not indicated    Raina Mina, RN Care Management Coordinator [place] Office [phone]

## 2022-02-21 ENCOUNTER — Encounter: Payer: Self-pay | Admitting: *Deleted

## 2022-02-21 ENCOUNTER — Telehealth: Payer: Self-pay | Admitting: *Deleted

## 2022-02-21 NOTE — Patient Instructions (Signed)
Visit Information  Thank you for taking time to visit with me today. Please don't hesitate to contact me if I can be of assistance to you.   Following are the goals we discussed today:   Goals Addressed               This Visit's Progress     no needs (pt-stated)        Care Coordination Interventions: Reviewed medications with patient and discussed adherence with all prescribed medications Reviewed scheduled/upcoming provider appointments including pending appointments with sufficient transportation Screening for signs and symptoms of depression related to chronic disease state  Assessed social determinant of health barriers         Please call the care guide team at [phone] if you need to cancel or reschedule your appointment.   If you are experiencing a Mental Health or [place] or need someone to talk to, please call the Suicide and Crisis Lifeline: 988  Patient verbalizes understanding of instructions and care plan provided today and agrees to view in [place]. Active MyChart status and patient understanding of how to access instructions and care plan via MyChart confirmed with patient.     No follow up needs at this time.  Raina Mina, RN Care Management Coordinator [place] Office [phone]

## 2022-02-21 NOTE — Patient Outreach (Signed)
  Care Coordination   Initial Visit Note   02/21/2022 Name: [name] [name] MRN: [id number] DOB: 11/10/1940  [name] [name] is a 81 y.o. year old female who sees Panosh, Standley Brooking, MD for primary care. I spoke with  [name] [name] by phone today.  What matters to the patients health and wellness today?  No needs    Goals Addressed               This Visit's Progress     no needs (pt-stated)        Care Coordination Interventions: Reviewed medications with patient and discussed adherence with all prescribed medications Reviewed scheduled/upcoming provider appointments including pending appointments with sufficient transportation Screening for signs and symptoms of depression related to chronic disease state  Assessed social determinant of health barriers         SDOH assessments and interventions completed:  Yes  SDOH Interventions Today    Flowsheet Row Most Recent Value  SDOH Interventions   Food Insecurity Interventions Intervention Not Indicated  Housing Interventions Intervention Not Indicated  Transportation Interventions Intervention Not Indicated  Utilities Interventions Intervention Not Indicated        Care Coordination Interventions Activated:  Yes  Care Coordination Interventions:  Yes, provided   Follow up plan: No further intervention required.   Encounter Outcome:  Pt. Visit Completed   Raina Mina, RN Care Management Coordinator [place] Office [phone]

## 2022-04-09 ENCOUNTER — Other Ambulatory Visit: Payer: Self-pay | Admitting: Internal Medicine

## 2022-04-10 ENCOUNTER — Other Ambulatory Visit: Payer: Self-pay | Admitting: Internal Medicine

## 2022-04-21 ENCOUNTER — Other Ambulatory Visit: Payer: Self-pay | Admitting: Internal Medicine

## 2022-04-24 ENCOUNTER — Other Ambulatory Visit: Payer: Self-pay | Admitting: Internal Medicine

## 2022-06-28 ENCOUNTER — Telehealth: Payer: Self-pay

## 2022-06-28 NOTE — Telephone Encounter (Signed)
Unsuccessful attempt to reach patient on preferred number listed in notes for scheduled AWV. Left message on voicemail okay to reschedule. 

## 2022-07-08 ENCOUNTER — Telehealth: Payer: Self-pay | Admitting: Internal Medicine

## 2022-07-08 NOTE — Telephone Encounter (Signed)
Called patient to schedule Medicare Annual Wellness Visit (AWV). Left message for patient to call back and schedule Medicare Annual Wellness Visit (AWV).  Last date of AWV: 06/26/21  Please schedule an appointment at any time with [facility] or [facility].  If any questions, please contact me at [phone].  Thank you ,  Barkley Boards AWV direct phone # [phone]

## 2022-07-20 ENCOUNTER — Other Ambulatory Visit: Payer: Self-pay | Admitting: Internal Medicine

## 2022-08-06 ENCOUNTER — Other Ambulatory Visit: Payer: Self-pay | Admitting: Internal Medicine

## 2022-08-06 ENCOUNTER — Telehealth: Payer: Self-pay | Admitting: Internal Medicine

## 2022-08-06 NOTE — Telephone Encounter (Signed)
Called patient to schedule Medicare Annual Wellness Visit (AWV). Left message for patient to call back and schedule Medicare Annual Wellness Visit (AWV).  Last date of AWV: 06/26/21  Please schedule an appointment at any time with [facility] or [employer].  If any questions, please contact me at [phone].  Thank you ,  Rudell Cobb AWV direct phone # [phone]

## 2022-09-04 ENCOUNTER — Ambulatory Visit: Payer: PPO | Admitting: Internal Medicine

## 2022-09-10 ENCOUNTER — Telehealth: Payer: Self-pay | Admitting: Internal Medicine

## 2022-09-10 ENCOUNTER — Encounter: Payer: Self-pay | Admitting: Internal Medicine

## 2022-09-10 ENCOUNTER — Ambulatory Visit (INDEPENDENT_AMBULATORY_CARE_PROVIDER_SITE_OTHER): Payer: Medicare HMO | Admitting: Internal Medicine

## 2022-09-10 VITALS — BP 114/84 | HR 99 | Temp 98.0°F | Ht 59.0 in | Wt 122.2 lb

## 2022-09-10 DIAGNOSIS — R4189 Other symptoms and signs involving cognitive functions and awareness: Secondary | ICD-10-CM | POA: Diagnosis not present

## 2022-09-10 DIAGNOSIS — N183 Chronic kidney disease, stage 3 unspecified: Secondary | ICD-10-CM | POA: Diagnosis not present

## 2022-09-10 DIAGNOSIS — R413 Other amnesia: Secondary | ICD-10-CM

## 2022-09-10 DIAGNOSIS — I10 Essential (primary) hypertension: Secondary | ICD-10-CM

## 2022-09-10 DIAGNOSIS — M159 Polyosteoarthritis, unspecified: Secondary | ICD-10-CM

## 2022-09-10 DIAGNOSIS — Z79899 Other long term (current) drug therapy: Secondary | ICD-10-CM | POA: Diagnosis not present

## 2022-09-10 MED ORDER — METOPROLOL SUCCINATE ER 25 MG PO TB24
25.0000 mg | ORAL_TABLET | Freq: Every day | ORAL | 0 refills | Status: DC
Start: 2022-09-10 — End: 2022-11-08

## 2022-09-10 NOTE — Progress Notes (Signed)
Chief Complaint  Patient presents with   Memory Loss    Pt came with daughter. Concerns of memory loss, forgetting things more often. progressively in the 9 months.  Noticed sx 2 years ago. Has family hx of alzheimer.      HPI: [name] [name] 82 y.o. come in for Chronic med condition management  and increasing  memory concerns .  Here with daughter today .  last visit with me was may 23   Has ckd ,OA ,and sleep memory  did have 2 falls  one she didn't remember . Wa shard to get up  crawled to the phone. To call daughter  no loc but was Weak and hard to get off floor.  Says mom had alzhiemers  and Ms Kucinski has had sig deterioration in memory  over the last 9 months Has had some strange scenarios where patient said like a dream but not a dream . Daughter says it is very odd behavior  . Concerns currently no obv danger,  episodes  no falling out of bed . Or gait changes  Daughter brings food eats once a day.  Not eating more than once a day she reports  Daughter  says finances on autopay.  Forgets medicine but daughter monitors.  Hx of could embolized aneurysm 2021 last scan 22  ha fu in July.  Cymbalta  80 per day .  Seems to help with pain  Takes meds once a day  would forget if bid  lopressor taking only once a day in Jan to 1 qd   ROS: See pertinent positives and negatives per HPI. Dreaming and seems real  months    Past Medical History:  Diagnosis Date   Acute kidney failure (HCC)    5 years ago   ACUTE KIDNEY FAILURE UNSPECIFIED 07/12/2009   Allergy    Anemia    Anxiety    Arthritis    Basal cell carcinoma    Blood clot in vein    states it was not deep vein   CAD (coronary artery disease)    Mild nonobstructive by CT   Cataract    Cerebral aneurysm without rupture 2021   Chronic kidney disease    stones   Depression    around the time of her husband's death   Diverticulosis of colon (without mention of hemorrhage)    Fibromyalgia    GERD (gastroesophageal reflux  disease)    Headache    Hiatal hernia    History of kidney stones    Hyperlipidemia    Hypertension    Interstitial cystitis    Kidney stone    Osteopenia    PONV (postoperative nausea and vomiting)    Scoliosis    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   Qualifier: Diagnosis of  By: Jarold Motto MD Lang Snow    Vertigo     Family History  Problem Relation Age of Onset   Alzheimer's disease Mother    Lung cancer Father        tobacco   Kidney cancer Son    Stroke Son    Arthritis Son    Hypertension Brother        mom's side   Osteoporosis Sister    Hypertension Sister    Breast cancer Cousin    Heart disease Maternal Uncle        x 4   Heart disease Maternal Aunt        x 4   Diabetes Paternal  Grandmother    Diabetes Paternal Uncle        x 2   Arthritis Daughter    Colon cancer Neg Hx     Social History   Socioeconomic History   Marital status: Widowed    Spouse name: Not on file   Number of children: 2   Years of education: Not on file   Highest education level: Not on file  Occupational History   Occupation: Retired   Tobacco Use   Smoking status: Never   Smokeless tobacco: Never  Vaping Use   Vaping Use: Never used  Substance and Sexual Activity   Alcohol use: Not Currently    Alcohol/week: 0.0 standard drinks of alcohol    Comment: rare   Drug use: No   Sexual activity: Not on file  Other Topics Concern   Not on file  Social History Narrative   Widowed 10/09 husband scd   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   Right handed   Social Determinants of Health   Financial Resource Strain: Low Risk  (06/26/2021)   Overall Financial Resource Strain (CARDIA)    Difficulty of Paying Living Expenses: Not hard at all  Food Insecurity: No Food Insecurity (02/21/2022)   Hunger Vital Sign    Worried About Running Out of Food in the Last Year: Never true    Ran Out of Food in the Last Year: Never true  Transportation Needs: No Transportation  Needs (02/21/2022)   PRAPARE - Administrator, Civil Service (Medical): No    Lack of Transportation (Non-Medical): No  Physical Activity: Inactive (06/26/2021)   Exercise Vital Sign    Days of Exercise per Week: 0 days    Minutes of Exercise per Session: 0 min  Stress: No Stress Concern Present (06/26/2021)   [employer] of Occupational Health - Occupational Stress Questionnaire    Feeling of Stress : Not at all  Social Connections: Moderately Integrated (06/26/2021)   Social Connection and Isolation Panel [NHANES]    Frequency of Communication with Friends and Family: More than three times a week    Frequency of Social Gatherings with Friends and Family: More than three times a week    Attends Religious Services: 1 to 4 times per year    Active Member of [employer] or Organizations: Yes    Attends Banker Meetings: 1 to 4 times per year    Marital Status: Widowed    Outpatient Medications Prior to Visit  Medication Sig Dispense Refill   amLODipine (NORVASC) 5 MG tablet TAKE 1 TABLET (5 MG TOTAL) BY MOUTH DAILY. 90 tablet 0   DULoxetine (CYMBALTA) 20 MG capsule TAKE 1 CAPSULE (20 MG TOTAL) BY MOUTH DAILY. IN ADDITION TO 60 MG PER DAY TO TOTAL 80 MG PER DAY 90 capsule 2   DULoxetine (CYMBALTA) 60 MG capsule TAKE 1 CAPSULE BY MOUTH TWICE A DAY (Patient taking differently: Take 60 mg by mouth daily.) 180 capsule 1   Probiotic Product (ALIGN PO) Take 1 tablet by mouth daily.      rosuvastatin (CRESTOR) 40 MG tablet TAKE 1 TABLET BY MOUTH EVERY DAY 90 tablet 0   metoprolol tartrate (LOPRESSOR) 25 MG tablet TAKE 1 TABLET BY MOUTH TWICE A DAY (Patient taking differently: daily.) 180 tablet 0   melatonin 5 MG TABS Take 5 mg by mouth at bedtime. (Patient not taking: Reported on 09/10/2022)     omeprazole (PRILOSEC) 40 MG capsule Take 1 capsule (  40 mg total) by mouth 2 (two) times daily. 180 capsule 3   Suvorexant (BELSOMRA) 10 MG TABS Take 10 mg by mouth at bedtime. For sleep  (Patient not taking: Reported on 09/10/2022) 30 tablet 0   No facility-administered medications prior to visit.     EXAM:  BP 114/84   Pulse 99   Temp 98 F (36.7 C) (Oral)   Ht 4\' 11"  (1.499 m)   Wt 122 lb 3.2 oz (55.4 kg)   SpO2 100%   BMI 24.68 kg/m   Body mass index is 24.68 kg/m.  GENERAL: vitals reviewed and listed above, alert, , and in no acute distress no dates   HEENT: atraumatic, conjunctiva  clear, no obvious abnormalities on inspection of external nose and ears tm clear OP : no lesion edema or exudate  NECK: no obvious masses on inspection palpation  LUNGS: clear to auscultation bilaterally, no wheezes, rales or rhonchi, good air movement CV: HRRR, no clubbing cyanosis or  peripheral edema nl cap refill  MS: moves all extremities gait un assisted slow  push off djd changes but steady thickened  overgrown toenails and dry skin but no feet lesions  PSYCH: pleasant and cooperative, no obvious depression or anxiety formal memory testing not done but  has obvious memory decrease from day Neuro non focal no tremor or rigidity noted  Lab Results  Component Value Date   WBC 4.0 05/26/2021   HGB 13.4 05/26/2021   HCT 41.7 05/26/2021   PLT 170 05/26/2021   GLUCOSE 111 (H) 05/26/2021   CHOL 143 09/01/2019   TRIG 69.0 09/01/2019   HDL 61.80 09/01/2019   LDLDIRECT 179.1 10/09/2007   LDLCALC 67 09/01/2019   ALT 9 06/29/2020   AST 15 06/29/2020   NA 138 05/26/2021   K 4.0 05/26/2021   CL 105 05/26/2021   CREATININE 1.05 (H) 05/26/2021   BUN 19 05/26/2021   CO2 27 05/26/2021   TSH 2.58 09/01/2019   INR 1.0 10/31/2020   HGBA1C 6.0 09/01/2019   BP Readings from Last 3 Encounters:  09/10/22 114/84  09/11/21 (!) 123/57  09/03/21 126/74    ASSESSMENT AND PLAN:  Discussed the following assessment and plan:  Complaints of memory disturbance - Plan: Vitamin B12, Basic metabolic panel, CBC with Differential/Platelet, Hemoglobin A1c, Hepatic function panel, Lipid  panel, TSH, Ambulatory referral to Neurology  Complaint related to dreams - Plan: Vitamin B12, Basic metabolic panel, CBC with Differential/Platelet, Hemoglobin A1c, Hepatic function panel, Lipid panel, TSH, Ambulatory referral to Neurology  Medication management - Plan: Vitamin B12, Basic metabolic panel, CBC with Differential/Platelet, Hemoglobin A1c, Hepatic function panel, Lipid panel, TSH  Stage 3 chronic kidney disease, unspecified whether stage 3a or 3b CKD (HCC) - Plan: Vitamin B12, Basic metabolic panel, CBC with Differential/Platelet, Hemoglobin A1c, Hepatic function panel, Lipid panel, TSH  Essential hypertension - Plan: Vitamin B12, Basic metabolic panel, CBC with Differential/Platelet, Hemoglobin A1c, Hepatic function panel, Lipid panel, TSH  Osteoarthritis of multiple joints, unspecified osteoarthritis type Lab monitoring advised  for ht renal function  hld change metoprolol to xl so can take once a day. Odd sx of dream state she says seems real.   Neuro consult to assess memory decline  medication help ? May be having fu aneurysm in summer . Balance seems reasonable  -Patient advised to return or notify health care team  if  new concerns arise.  Patient Instructions  Good to see you today   Maybe change metoprolol to  extended  release to avoid up and down effect and take once a day.  Lab today  and plan neuro consult.   Continue memory observations. See podiatrist about toe nails help.    Neta Mends. Dekker Verga M.D.

## 2022-09-10 NOTE — Patient Instructions (Addendum)
Good to see you today   Maybe change metoprolol to  extended release to avoid up and down effect and take once a day.  Lab today  and plan neuro consult.   Continue memory observations. See podiatrist about toe nails help.

## 2022-09-10 NOTE — Telephone Encounter (Signed)
Patients daughter Cordelia Pen would like for neuro to call her at [phone] to schedule appointment.

## 2022-09-11 ENCOUNTER — Ambulatory Visit: Payer: PPO | Admitting: Internal Medicine

## 2022-09-11 LAB — CBC WITH DIFFERENTIAL/PLATELET
Basophils Absolute: 0.1 10*3/uL (ref 0.0–0.1)
Basophils Relative: 0.9 % (ref 0.0–3.0)
Eosinophils Absolute: 0.1 10*3/uL (ref 0.0–0.7)
Eosinophils Relative: 0.9 % (ref 0.0–5.0)
HCT: 43.3 % (ref 36.0–46.0)
Hemoglobin: 14.1 g/dL (ref 12.0–15.0)
Lymphocytes Relative: 16.8 % (ref 12.0–46.0)
Lymphs Abs: 1.1 10*3/uL (ref 0.7–4.0)
MCHC: 32.5 g/dL (ref 30.0–36.0)
MCV: 91.2 fl (ref 78.0–100.0)
Monocytes Absolute: 0.4 10*3/uL (ref 0.1–1.0)
Monocytes Relative: 5.9 % (ref 3.0–12.0)
Neutro Abs: 4.8 10*3/uL (ref 1.4–7.7)
Neutrophils Relative %: 75.5 % (ref 43.0–77.0)
Platelets: 196 10*3/uL (ref 150.0–400.0)
RBC: 4.75 Mil/uL (ref 3.87–5.11)
RDW: 15.2 % (ref 11.5–15.5)
WBC: 6.3 10*3/uL (ref 4.0–10.5)

## 2022-09-11 LAB — VITAMIN B12: Vitamin B-12: 147 pg/mL — ABNORMAL LOW (ref 211–911)

## 2022-09-11 LAB — BASIC METABOLIC PANEL
BUN: 21 mg/dL (ref 6–23)
CO2: 27 mEq/L (ref 19–32)
Calcium: 9.7 mg/dL (ref 8.4–10.5)
Chloride: 105 mEq/L (ref 96–112)
Creatinine, Ser: 1.03 mg/dL (ref 0.40–1.20)
GFR: 50.94 mL/min — ABNORMAL LOW (ref >60.00)
Glucose, Bld: 93 mg/dL (ref 70–99)
Potassium: 4.1 mEq/L (ref 3.5–5.1)
Sodium: 141 mEq/L (ref 135–145)

## 2022-09-11 LAB — HEPATIC FUNCTION PANEL
ALT: 10 U/L (ref 0–35)
AST: 18 U/L (ref 0–37)
Albumin: 4 g/dL (ref 3.5–5.2)
Alkaline Phosphatase: 46 U/L (ref 39–117)
Bilirubin, Direct: 0.1 mg/dL (ref 0.0–0.3)
Total Bilirubin: 0.6 mg/dL (ref 0.2–1.2)
Total Protein: 7 g/dL (ref 6.0–8.3)

## 2022-09-11 LAB — LIPID PANEL
Cholesterol: 234 mg/dL — ABNORMAL HIGH (ref 0–200)
HDL: 64.8 mg/dL (ref >39.00)
LDL Cholesterol: 145 mg/dL — ABNORMAL HIGH (ref 0–99)
NonHDL: 169
Total CHOL/HDL Ratio: 4
Triglycerides: 119 mg/dL (ref 0.0–149.0)
VLDL: 23.8 mg/dL (ref 0.0–40.0)

## 2022-09-11 LAB — TSH: TSH: 1.69 u[IU]/mL (ref 0.35–5.50)

## 2022-09-11 LAB — HEMOGLOBIN A1C: Hgb A1c MFr Bld: 5.5 % (ref 4.6–6.5)

## 2022-09-11 NOTE — Progress Notes (Signed)
B12  level is low   please begin b12 1000 mcg or higher every day  sublingual or dissolvable preferred for better absorption .  Rest of results are in range except ldl cholesterol suspecting that has not been taking rosuvastatin regularly.

## 2022-09-25 ENCOUNTER — Encounter: Payer: Self-pay | Admitting: Physician Assistant

## 2022-10-15 ENCOUNTER — Other Ambulatory Visit: Payer: Self-pay | Admitting: Gastroenterology

## 2022-10-16 ENCOUNTER — Ambulatory Visit: Payer: Medicare HMO | Admitting: Physician Assistant

## 2022-10-16 ENCOUNTER — Other Ambulatory Visit (INDEPENDENT_AMBULATORY_CARE_PROVIDER_SITE_OTHER): Payer: Medicare HMO

## 2022-10-16 ENCOUNTER — Ambulatory Visit: Payer: Medicare HMO

## 2022-10-16 ENCOUNTER — Encounter: Payer: Self-pay | Admitting: Physician Assistant

## 2022-10-16 VITALS — BP 132/84 | HR 74 | Ht 59.0 in | Wt 122.0 lb

## 2022-10-16 DIAGNOSIS — R413 Other amnesia: Secondary | ICD-10-CM

## 2022-10-16 NOTE — Patient Instructions (Addendum)
It was a pleasure to see you today at our office.   Recommendations:   MRI of the brain, the radiology office will call you to arrange you appointment   Check labs today Thyroid Follow up  Nov 14. 11:30    For assessment of decision of mental capacity and competency:  Call Dr. Erick Blinks, geriatric psychiatrist at [phone] Counseling regarding caregiver distress, including caregiver depression, anxiety and issues regarding community resources, adult day care programs, adult living facilities, or memory care questions:  please contact your  Primary Doctor's Social Worker   Whom to call: Memory  decline, memory medications: Call our office [phone]   For psychiatric meds, mood meds: Please have your primary care physician manage these medications.  If you have any severe symptoms of a stroke, or other severe issues such as confusion,severe chills or fever, etc call 911 or go to the ER as you may need to be evaluated further    RECOMMENDATIONS FOR ALL PATIENTS WITH MEMORY PROBLEMS: 1. Continue to exercise (Recommend 30 minutes of walking everyday, or 3 hours every week) 2. Increase social interactions - continue going to [place] and enjoy social gatherings with friends and family 3. Eat healthy, avoid fried foods and eat more fruits and vegetables 4. Maintain adequate blood pressure, blood sugar, and blood cholesterol level. Reducing the risk of stroke and cardiovascular disease also helps promoting better memory. 5. Avoid stressful situations. Live a simple life and avoid aggravations. Organize your time and prepare for the next day in anticipation. 6. Sleep well, avoid any interruptions of sleep and avoid any distractions in the bedroom that may interfere with adequate sleep quality 7. Avoid sugar, avoid sweets as there is a strong link between excessive sugar intake, diabetes, and cognitive impairment We discussed the Mediterranean diet, which has been shown to help patients  reduce the risk of progressive memory disorders and reduces cardiovascular risk. This includes eating fish, eat fruits and green leafy vegetables, nuts like almonds and hazelnuts, walnuts, and also use olive oil. Avoid fast foods and fried foods as much as possible. Avoid sweets and sugar as sugar use has been linked to worsening of memory function.  There is always a concern of gradual progression of memory problems. If this is the case, then we may need to adjust level of care according to patient needs. Support, both to the patient and caregiver, should then be put into place.      You have been referred for a neuropsychological evaluation (i.e., evaluation of memory and thinking abilities). Please bring someone with you to this appointment if possible, as it is helpful for the doctor to hear from both you and another adult who knows you well. Please bring eyeglasses and hearing aids if you wear them.    The evaluation will take approximately 3 hours and has two parts:   The first part is a clinical interview with the neuropsychologist (Dr. Milbert Coulter or Dr. Roseanne Reno). During the interview, the neuropsychologist will speak with you and the individual you brought to the appointment.    The second part of the evaluation is testing with the doctor's technician Annabelle Harman or Selena Batten). During the testing, the technician will ask you to remember different types of material, solve problems, and answer some questionnaires. Your family member will not be present for this portion of the evaluation.   Please note: We must reserve several hours of the neuropsychologist's time and the psychometrician's time for your evaluation appointment. As such, there is  a No-Show fee of $100. If you are unable to attend any of your appointments, please contact our office as soon as possible to reschedule.    FALL PRECAUTIONS: Be cautious when walking. Scan the area for obstacles that may increase the risk of trips and falls. When  getting up in the mornings, sit up at the edge of the bed for a few minutes before getting out of bed. Consider elevating the bed at the head end to avoid drop of blood pressure when getting up. Walk always in a well-lit room (use night lights in the walls). Avoid area rugs or power cords from appliances in the middle of the walkways. Use a walker or a cane if necessary and consider physical therapy for balance exercise. Get your eyesight checked regularly.  FINANCIAL OVERSIGHT: Supervision, especially oversight when making financial decisions or transactions is also recommended.  HOME SAFETY: Consider the safety of the kitchen when operating appliances like stoves, microwave oven, and blender. Consider having supervision and share cooking responsibilities until no longer able to participate in those. Accidents with firearms and other hazards in the house should be identified and addressed as well.   ABILITY TO BE LEFT ALONE: If patient is unable to contact 911 operator, consider using LifeLine, or when the need is there, arrange for someone to stay with patients. Smoking is a fire hazard, consider supervision or cessation. Risk of wandering should be assessed by caregiver and if detected at any point, supervision and safe proof recommendations should be instituted.  MEDICATION SUPERVISION: Inability to self-administer medication needs to be constantly addressed. Implement a mechanism to ensure safe administration of the medications.   DRIVING: Regarding driving, in patients with progressive memory problems, driving will be impaired. We advise to have someone else do the driving if trouble finding directions or if minor accidents are reported. Independent driving assessment is available to determine safety of driving.   If you are interested in the driving assessment, you can contact the following:  [facility] in [place] [phone]  [facility]  [phone]  [facility] [phone] or [phone]    Mediterranean Diet A Mediterranean diet refers to food and lifestyle choices that are based on the traditions of countries located on the [employer] [place]. This way of eating has been shown to help prevent certain conditions and improve outcomes for people who have chronic diseases, like kidney disease and heart disease. What are tips for following this plan? Lifestyle  Cook and eat meals together with your family, when possible. Drink enough fluid to keep your urine clear or pale yellow. Be physically active every day. This includes: Aerobic exercise like running or swimming. Leisure activities like gardening, walking, or housework. Get 7-8 hours of sleep each night. If recommended by your health care provider, drink red wine in moderation. This means 1 glass a day for nonpregnant women and 2 glasses a day for men. A glass of wine equals 5 oz (150 mL). Reading food labels  Check the serving size of packaged foods. For foods such as rice and pasta, the serving size refers to the amount of cooked product, not dry. Check the total fat in packaged foods. Avoid foods that have saturated fat or trans fats. Check the ingredients list for added sugars, such as corn syrup. Shopping  At the grocery store, buy most of your food from the areas near the walls of the store. This includes: Fresh fruits and vegetables (produce). Grains,  beans, nuts, and seeds. Some of these may be available in unpackaged forms or large amounts (in bulk). Fresh seafood. Poultry and eggs. Low-fat dairy products. Buy whole ingredients instead of prepackaged foods. Buy fresh fruits and vegetables in-season from local farmers markets. Buy frozen fruits and vegetables in resealable bags. If you do not have access to quality fresh seafood, buy precooked frozen shrimp or canned fish, such as tuna, salmon, or sardines. Buy  small amounts of raw or cooked vegetables, salads, or olives from the deli or salad bar at your store. Stock your pantry so you always have certain foods on hand, such as olive oil, canned tuna, canned tomatoes, rice, pasta, and beans. Cooking  Cook foods with extra-virgin olive oil instead of using butter or other vegetable oils. Have meat as a side dish, and have vegetables or grains as your main dish. This means having meat in small portions or adding small amounts of meat to foods like pasta or stew. Use beans or vegetables instead of meat in common dishes like chili or lasagna. Experiment with different cooking methods. Try roasting or broiling vegetables instead of steaming or sauteing them. Add frozen vegetables to soups, stews, pasta, or rice. Add nuts or seeds for added healthy fat at each meal. You can add these to yogurt, salads, or vegetable dishes. Marinate fish or vegetables using olive oil, lemon juice, garlic, and fresh herbs. Meal planning  Plan to eat 1 vegetarian meal one day each week. Try to work up to 2 vegetarian meals, if possible. Eat seafood 2 or more times a week. Have healthy snacks readily available, such as: Vegetable sticks with hummus. Greek yogurt. Fruit and nut trail mix. Eat balanced meals throughout the week. This includes: Fruit: 2-3 servings a day Vegetables: 4-5 servings a day Low-fat dairy: 2 servings a day Fish, poultry, or lean meat: 1 serving a day Beans and legumes: 2 or more servings a week Nuts and seeds: 1-2 servings a day Whole grains: 6-8 servings a day Extra-virgin olive oil: 3-4 servings a day Limit red meat and sweets to only a few servings a month What are my food choices? Mediterranean diet Recommended Grains: Whole-grain pasta. Brown rice. Bulgar wheat. Polenta. Couscous. Whole-wheat bread. Orpah Cobb. Vegetables: Artichokes. Beets. Broccoli. Cabbage. Carrots. Eggplant. Green beans. Chard. Kale. Spinach. Onions. Leeks. Peas.  Squash. Tomatoes. Peppers. Radishes. Fruits: Apples. Apricots. Avocado. Berries. Bananas. Cherries. Dates. Figs. Grapes. Lemons. Melon. Oranges. Peaches. Plums. Pomegranate. Meats and other protein foods: Beans. Almonds. Sunflower seeds. Pine nuts. Peanuts. Cod. Salmon. Scallops. Shrimp. Tuna. Tilapia. Clams. Oysters. Eggs. Dairy: Low-fat milk. Cheese. Greek yogurt. Beverages: Water. Red wine. Herbal tea. Fats and oils: Extra virgin olive oil. Avocado oil. Grape seed oil. Sweets and desserts: [place] yogurt with honey. Baked apples. Poached pears. Trail mix. Seasoning and other foods: Basil. Cilantro. Coriander. Cumin. Mint. Parsley. Sage. Rosemary. Tarragon. Garlic. Oregano. Thyme. Pepper. Balsalmic vinegar. Tahini. Hummus. Tomato sauce. Olives. Mushrooms. Limit these Grains: Prepackaged pasta or rice dishes. Prepackaged cereal with added sugar. Vegetables: Deep fried potatoes (french fries). Fruits: Fruit canned in syrup. Meats and other protein foods: Beef. Pork. Lamb. Poultry with skin. Hot dogs. Tomasa Blase. Dairy: Ice cream. Sour cream. Whole milk. Beverages: Juice. Sugar-sweetened soft drinks. Beer. Liquor and spirits. Fats and oils: Butter. Canola oil. Vegetable oil. Beef fat (tallow). Lard. Sweets and desserts: Cookies. Cakes. Pies. Candy. Seasoning and other foods: Mayonnaise. Premade sauces and marinades. The items listed may not be a complete list. Talk with your dietitian about  what dietary choices are right for you. Summary The Mediterranean diet includes both food and lifestyle choices. Eat a variety of fresh fruits and vegetables, beans, nuts, seeds, and whole grains. Limit the amount of red meat and sweets that you eat. Talk with your health care provider about whether it is safe for you to drink red wine in moderation. This means 1 glass a day for nonpregnant women and 2 glasses a day for men. A glass of wine equals 5 oz (150 mL). This information is not intended to replace advice  given to you by your health care provider. Make sure you discuss any questions you have with your health care provider. Document Released: 11/30/2015 Document Revised: 01/02/2016 Document Reviewed: 11/30/2015 Elsevier Interactive Patient Education  2017 [employer].      Labs today [address] MRI at [facility] [phone]

## 2022-10-16 NOTE — Progress Notes (Signed)
Assessment/Plan:    The patient is seen in neurologic consultation at the request of Panosh, Neta Mends, MD for the evaluation of memory.  [name] [name] is a very pleasant 82 y.o. year old RH female with a history of hypertension, B12 deficiency, arthritis, vitamin D deficiency, history of embolized aneurysm in 2021, anxiety, depression, fibromyalgia, GERD, CAD, CKD stage III seen today for evaluation of memory loss. MoCA today is  15/30.Marland Kitchen Patient is able to participate on her  IADLs without significant difficulties. She no longer drives. 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 Pending on the results will start antidementia agent .  Check TSH Recommend good control of cardiovascular risk factors.   Continue to control mood as per PCP Folllow up in 4 months  Subjective:    The patient is accompanied by her daughter  who supplements the history.    How long did patient have memory difficulties? Within the last 1.5 years especially after the aneurysm embolization. Patient has some difficulty remembering recent conversations and people names. STM worse that LTM.  repeats oneself?  Endorsed. During the visit she did repeat herself often. Disoriented when walking into a room?  Patient denies    Leaving objects in unusual places?  denies   Wandering behavior?  One time a neighbor had to bring her back home because she was knocking on doors looking for her children.  Any personality changes ? Endorsed, over the last 6 months, with more irritability.   Any history of depression?: Endorsed, especially after her husband died 14 years ago.  Hallucinations or paranoia? Endorsed for the last 2 months. She saw her children as young. She always worries about the little children. She told her daughter that she was in a  storage facility -she has not been there in 84 years-then realized that she was at home.  She has seen dead family members. "Grandma Pansie was visiting" Seizures? denies    Any sleep changes? She reports sleeping well, but her daughter reports that she has an odd sleeping pattern, sometimes she may be up all night.   Denies vivid dreams, REM behavior or sleepwalking   Sleep apnea? denies   Any hygiene concerns?  denies   Independent of bathing and dressing? Endorsed  Does the patient need help with medications? Daughter is in charge   Who is in charge of the finances? Daughter is in charge     Any changes in appetite?   Daughter says she eats one meal a day that she brings to her. She may have forgotten that she did not eat otherwise    Patient have trouble swallowing?  denies   Does the patient cook? No  Any headaches?  denies   Chronic pain? denies   Ambulates with difficulty?  Needs a cane but does not want to use it.  Recent falls or head injuries? Yesterday she fell on the yard hurting the fingers with thorns, but not hitting her head or LOC Vision changes? Denies Unilateral weakness, numbness or tingling? denies   Any tremors? denies   Any anosmia? denies   Any incontinence of urine? Endorsed, refuses to wear diapers  Any bowel dysfunction? denies      Patient lives alone.   History of heavy alcohol intake? denies   History of heavy tobacco use? denies  Family history of dementia?   Her mother and aunts with dementia AD Does patient drive? No    Allergies  Allergen Reactions   Gadolinium Derivatives Hives    PER DR DERRY PT NEEDS 13HOUR PREP BEFORE FUTURE CONTRASTED MRI'S//[facility]   Aspirin Other (See Comments)    Acute renal failure    Lactose Intolerance (Gi) Other (See Comments)    Gi upset   Meloxicam     REACTION: acute renal failure  with hypovolemic trigger [facility]  3 2011 Pt states allergic to all NSAIDS    Metoprolol    Sulfamethoxazole Hives    Current  Outpatient Medications  Medication Instructions   amLODipine (NORVASC) 5 mg, Oral, Daily   Belsomra 10 mg, Oral, Nightly, For sleep   DULoxetine (CYMBALTA) 60 mg, Oral, 2 times daily   DULoxetine (CYMBALTA) 20 mg, Oral, Daily, In addition to 60 mg per day to total 80 mg per day   melatonin 5 mg, Oral, Daily at bedtime   metoprolol succinate (TOPROL-XL) 25 mg, Oral, Daily, Stop the lopressor (to avoid twice a day dosing.)   omeprazole (PRILOSEC) 40 mg, Oral, 2 times daily   Probiotic Product (ALIGN PO) 1 tablet, Oral, Daily   rosuvastatin (CRESTOR) 40 mg, Oral, Daily     VITALS:   Vitals:   10/16/22 1316  BP: 132/84  Pulse: 74  SpO2: 98%  Weight: 122 lb (55.3 kg)  Height: 4\' 11"  (1.499 m)      PHYSICAL EXAM   HEENT:  Normocephalic, atraumatic.  The superficial temporal arteries are without ropiness or tenderness. Cardiovascular: Regular rate and rhythm. Lungs: Clear to auscultation bilaterally. Neck: There are no carotid bruits noted bilaterally.  NEUROLOGICAL:    10/16/2022    4:00 PM  Montreal Cognitive Assessment   Visuospatial/ Executive (0/5) 1  Naming (0/3) 2  Attention: Read list of digits (0/2) 2  Attention: Read list of letters (0/1) 1  Attention: Serial 7 subtraction starting at 100 (0/3) 1  Language: Repeat phrase (0/2) 2  Language : Fluency (0/1) 1  Abstraction (0/2) 1  Delayed Recall (0/5) 0  Orientation (0/6) 3  Total 14  Adjusted Score (based on education) 15        No data to display           Orientation:  Alert and oriented to person, place and not to time. No aphasia or dysarthria. Fund of knowledge is appropriate. Recent and remote memory impaired.  Attention and concentration are reduced.  Able to name objects and repeat phrases. Delayed recall 0/5 Cranial nerves: There is good facial symmetry. Extraocular muscles are intact and visual fields are full to confrontational testing. Speech is fluent and clear. No tongue deviation. Hearing is  intact to conversational tone.  Tone: Tone is good throughout. Sensation: Sensation is intact to light touch.  Vibration is intact  Coordination: The patient has no difficulty with RAM's or FNF bilaterally. Normal finger to nose  Motor: Strength is 5/5 in the bilateral upper and lower extremities. There is no pronator drift. There are no fasciculations noted. DTR's: Deep tendon reflexes are 2/4 bilaterally. Gait and Station: The patient is able to ambulate without difficulty. Gait is cautious and narrow. Stride length is normal        Thank you for allowing [place] the opportunity to participate in the care of this nice patient. Please do not hesitate to contact us for any questions or concerns.   Total time spent on today's visit was  57 minutes dedicated to this patient today, preparing to see patient, examining the patient, ordering tests and/or medications and counseling the patient, documenting clinical information in the EHR or other health record, independently interpreting results and communicating results to the patient/family, discussing treatment and goals, answering patient's questions and coordinating care.  Cc:  Madelin Headings, MD  Marlowe Kays 10/16/2022 4:49 PM

## 2022-10-16 NOTE — Telephone Encounter (Signed)
Dr Russella Dar,  This is a patient of Dr Christella Hartigan.  Please advise if OK to refill as you are DOD pm.  Thank you, Joni Reining

## 2022-10-17 LAB — TSH: TSH: 1.35 u[IU]/mL (ref 0.35–5.50)

## 2022-10-17 NOTE — Progress Notes (Signed)
Thyroid levels are normal.

## 2022-11-02 ENCOUNTER — Other Ambulatory Visit: Payer: Self-pay | Admitting: Internal Medicine

## 2022-11-07 ENCOUNTER — Other Ambulatory Visit: Payer: Self-pay | Admitting: Internal Medicine

## 2022-11-26 ENCOUNTER — Encounter: Payer: Self-pay | Admitting: Physician Assistant

## 2022-11-27 ENCOUNTER — Ambulatory Visit
Admission: RE | Admit: 2022-11-27 | Discharge: 2022-11-27 | Disposition: A | Discharge status: Home / Self Care | Payer: Medicare HMO | Source: Ambulatory Visit | Attending: Physician Assistant | Admitting: Physician Assistant

## 2022-11-27 DIAGNOSIS — I6782 Cerebral ischemia: Secondary | ICD-10-CM | POA: Diagnosis not present

## 2022-11-27 DIAGNOSIS — R413 Other amnesia: Secondary | ICD-10-CM | POA: Diagnosis not present

## 2022-12-09 NOTE — Progress Notes (Signed)
MRI brain shows chronic age related circulation issues. No acute findings such as stroke or masses. Thanks

## 2023-01-12 ENCOUNTER — Other Ambulatory Visit: Payer: Self-pay | Admitting: Family

## 2023-02-05 ENCOUNTER — Other Ambulatory Visit: Payer: Self-pay | Admitting: Family

## 2023-03-05 ENCOUNTER — Other Ambulatory Visit: Payer: Self-pay | Admitting: Family

## 2023-03-06 ENCOUNTER — Ambulatory Visit: Payer: Medicare HMO | Admitting: Physician Assistant

## 2023-03-06 ENCOUNTER — Encounter: Payer: Self-pay | Admitting: Physician Assistant

## 2023-03-06 VITALS — BP 153/92 | HR 60 | Resp 20 | Ht 59.0 in | Wt 129.0 lb

## 2023-03-06 DIAGNOSIS — F03918 Unspecified dementia, unspecified severity, with other behavioral disturbance: Secondary | ICD-10-CM

## 2023-03-06 MED ORDER — DONEPEZIL HCL 10 MG PO TABS: ORAL_TABLET | ORAL | 3 refills | Status: DC

## 2023-03-06 MED ORDER — DONEPEZIL HCL 10 MG PO TABS
ORAL_TABLET | ORAL | 11 refills | Status: DC
Start: 2023-03-06 — End: 2023-06-26

## 2023-03-06 NOTE — Patient Instructions (Addendum)
It was a pleasure to see you today at our office.   Recommendations:  Start donepezil 10  mg , take half pill for 2 weeks and then increase to 1 full tab daily  Continue B12 supplements  Follow up March 6 at  11:30    For assessment of decision of mental capacity and competency:  Call Dr. Erick Blinks, geriatric psychiatrist at [phone] Counseling regarding caregiver distress, including caregiver depression, anxiety and issues regarding community resources, adult day care programs, adult living facilities, or memory care questions:  please contact your  Primary Doctor's Social Worker   Whom to call: Memory  decline, memory medications: Call our office [phone]   For psychiatric meds, mood meds: Please have your primary care physician manage these medications.  If you have any severe symptoms of a stroke, or other severe issues such as confusion,severe chills or fever, etc call 911 or go to the ER as you may need to be evaluated further    RECOMMENDATIONS FOR ALL PATIENTS WITH MEMORY PROBLEMS: 1. Continue to exercise (Recommend 30 minutes of walking everyday, or 3 hours every week) 2. Increase social interactions - continue going to [facility] [place] and enjoy social gatherings with friends and family 3. Eat healthy, avoid fried foods and eat more fruits and vegetables 4. Maintain adequate blood pressure, blood sugar, and blood cholesterol level. Reducing the risk of stroke and cardiovascular disease also helps promoting better memory. 5. Avoid stressful situations. Live a simple life and avoid aggravations. Organize your time and prepare for the next day in anticipation. 6. Sleep well, avoid any interruptions of sleep and avoid any distractions in the bedroom that may interfere with adequate sleep quality 7. Avoid sugar, avoid sweets as there is a strong link between excessive sugar intake, diabetes, and cognitive impairment We discussed the Mediterranean diet, which has been shown to help  patients reduce the risk of progressive memory disorders and reduces cardiovascular risk. This includes eating fish, eat fruits and green leafy vegetables, nuts like almonds and hazelnuts, walnuts, and also use olive oil. Avoid fast foods and fried foods as much as possible. Avoid sweets and sugar as sugar use has been linked to worsening of memory function.  There is always a concern of gradual progression of memory problems. If this is the case, then we may need to adjust level of care according to patient needs. Support, both to the patient and caregiver, should then be put into place.       FALL PRECAUTIONS: Be cautious when walking. Scan the area for obstacles that may increase the risk of trips and falls. When getting up in the mornings, sit up at the edge of the bed for a few minutes before getting out of bed. Consider elevating the bed at the head end to avoid drop of blood pressure when getting up. Walk always in a well-lit room (use night lights in the walls). Avoid area rugs or power cords from appliances in the middle of the walkways. Use a walker or a cane if necessary and consider physical therapy for balance exercise. Get your eyesight checked regularly.  FINANCIAL OVERSIGHT: Supervision, especially oversight when making financial decisions or transactions is also recommended.  HOME SAFETY: Consider the safety of the kitchen when operating appliances like stoves, microwave oven, and blender. Consider having supervision and share cooking responsibilities until no longer able to participate in those. Accidents with firearms and other hazards in the house should be identified and addressed as well.  ABILITY TO BE LEFT ALONE: If patient is unable to contact 911 operator, consider using LifeLine, or when the need is there, arrange for someone to stay with patients. Smoking is a fire hazard, consider supervision or cessation. Risk of wandering should be assessed by caregiver and if detected  at any point, supervision and safe proof recommendations should be instituted.  MEDICATION SUPERVISION: Inability to self-administer medication needs to be constantly addressed. Implement a mechanism to ensure safe administration of the medications.   DRIVING: Regarding driving, in patients with progressive memory problems, driving will be impaired. We advise to have someone else do the driving if trouble finding directions or if minor accidents are reported. Independent driving assessment is available to determine safety of driving.   If you are interested in the driving assessment, you can contact the following:  [employer] in [place] [phone]  [facility] [phone]  [facility] [phone] or [phone]    Mediterranean Diet A Mediterranean diet refers to food and lifestyle choices that are based on the traditions of countries located on the [employer]. This way of eating has been shown to help prevent certain conditions and improve outcomes for people who have chronic diseases, like kidney disease and heart disease. What are tips for following this plan? Lifestyle  Cook and eat meals together with your family, when possible. Drink enough fluid to keep your urine clear or pale yellow. Be physically active every day. This includes: Aerobic exercise like running or swimming. Leisure activities like gardening, walking, or housework. Get 7-8 hours of sleep each night. If recommended by your health care provider, drink red wine in moderation. This means 1 glass a day for nonpregnant women and 2 glasses a day for men. A glass of wine equals 5 oz (150 mL). Reading food labels  Check the serving size of packaged foods. For foods such as rice and pasta, the serving size refers to the amount of cooked product, not dry. Check the total fat in packaged foods. Avoid foods that have saturated fat or  trans fats. Check the ingredients list for added sugars, such as corn syrup. Shopping  At the grocery store, buy most of your food from the areas near the walls of the store. This includes: Fresh fruits and vegetables (produce). Grains, beans, nuts, and seeds. Some of these may be available in unpackaged forms or large amounts (in bulk). Fresh seafood. Poultry and eggs. Low-fat dairy products. Buy whole ingredients instead of prepackaged foods. Buy fresh fruits and vegetables in-season from local farmers markets. Buy frozen fruits and vegetables in resealable bags. If you do not have access to quality fresh seafood, buy precooked frozen shrimp or canned fish, such as tuna, salmon, or sardines. Buy small amounts of raw or cooked vegetables, salads, or olives from the deli or salad bar at your store. Stock your pantry so you always have certain foods on hand, such as olive oil, canned tuna, canned tomatoes, rice, pasta, and beans. Cooking  Cook foods with extra-virgin olive oil instead of using butter or other vegetable oils. Have meat as a side dish, and have vegetables or grains as your main dish. This means having meat in small portions or adding small amounts of meat to foods like pasta or stew. Use beans or vegetables instead of meat in common dishes like chili or lasagna. Experiment with different cooking methods. Try roasting or broiling vegetables instead of steaming or sauteing them. Add frozen vegetables  to soups, stews, pasta, or rice. Add nuts or seeds for added healthy fat at each meal. You can add these to yogurt, salads, or vegetable dishes. Marinate fish or vegetables using olive oil, lemon juice, garlic, and fresh herbs. Meal planning  Plan to eat 1 vegetarian meal one day each week. Try to work up to 2 vegetarian meals, if possible. Eat seafood 2 or more times a week. Have healthy snacks readily available, such as: Vegetable sticks with hummus. Greek yogurt. Fruit and  nut trail mix. Eat balanced meals throughout the week. This includes: Fruit: 2-3 servings a day Vegetables: 4-5 servings a day Low-fat dairy: 2 servings a day Fish, poultry, or lean meat: 1 serving a day Beans and legumes: 2 or more servings a week Nuts and seeds: 1-2 servings a day Whole grains: 6-8 servings a day Extra-virgin olive oil: 3-4 servings a day Limit red meat and sweets to only a few servings a month What are my food choices? Mediterranean diet Recommended Grains: Whole-grain pasta. Brown rice. Bulgar wheat. Polenta. Couscous. Whole-wheat bread. Orpah Cobb. Vegetables: Artichokes. Beets. Broccoli. Cabbage. Carrots. Eggplant. Green beans. Chard. Kale. Spinach. Onions. Leeks. Peas. Squash. Tomatoes. Peppers. Radishes. Fruits: Apples. Apricots. Avocado. Berries. Bananas. Cherries. Dates. Figs. Grapes. Lemons. Melon. Oranges. Peaches. Plums. Pomegranate. Meats and other protein foods: Beans. Almonds. Sunflower seeds. Pine nuts. Peanuts. Cod. Salmon. Scallops. Shrimp. Tuna. Tilapia. Clams. Oysters. Eggs. Dairy: Low-fat milk. Cheese. Greek yogurt. Beverages: Water. Red wine. Herbal tea. Fats and oils: Extra virgin olive oil. Avocado oil. Grape seed oil. Sweets and desserts: [place] yogurt with honey. Baked apples. Poached pears. Trail mix. Seasoning and other foods: Basil. Cilantro. Coriander. Cumin. Mint. Parsley. Sage. Rosemary. Tarragon. Garlic. Oregano. Thyme. Pepper. Balsalmic vinegar. Tahini. Hummus. Tomato sauce. Olives. Mushrooms. Limit these Grains: Prepackaged pasta or rice dishes. Prepackaged cereal with added sugar. Vegetables: Deep fried potatoes (french fries). Fruits: Fruit canned in syrup. Meats and other protein foods: Beef. Pork. Lamb. Poultry with skin. Hot dogs. Tomasa Blase. Dairy: Ice cream. Sour cream. Whole milk. Beverages: Juice. Sugar-sweetened soft drinks. Beer. Liquor and spirits. Fats and oils: Butter. Canola oil. Vegetable oil. Beef fat (tallow).  Lard. Sweets and desserts: Cookies. Cakes. Pies. Candy. Seasoning and other foods: Mayonnaise. Premade sauces and marinades. The items listed may not be a complete list. Talk with your dietitian about what dietary choices are right for you. Summary The Mediterranean diet includes both food and lifestyle choices. Eat a variety of fresh fruits and vegetables, beans, nuts, seeds, and whole grains. Limit the amount of red meat and sweets that you eat. Talk with your health care provider about whether it is safe for you to drink red wine in moderation. This means 1 glass a day for nonpregnant women and 2 glasses a day for men. A glass of wine equals 5 oz (150 mL). This information is not intended to replace advice given to you by your health care provider. Make sure you discuss any questions you have with your health care provider. Document Released: 11/30/2015 Document Revised: 01/02/2016 Document Reviewed: 11/30/2015 Elsevier Interactive Patient Education  2017 [employer].      Labs today [address] MRI at [facility] [phone]

## 2023-03-06 NOTE — Progress Notes (Signed)
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 RH female with a history of hypertension, B12 deficiency, arthritis, vitamin D deficiency, history of embolized aneurysm in 2021, anxiety, depression, fibromyalgia, GERD, CAD, CKD stage III seen today in follow up for memory loss. Patient is not on antidementia medication.  MRI of the brain 12/09/2022, personally reviewed remarkable for chronic small vessel ischemic disease mildly progressed since 2021, as well as evidence of interval vascular tip aneurysm embolization, no acute intracranial abnormalities.  She is able to participate on her ADLs without significant difficulties, no longer drives.  Discussed with her daughter initiating donepezil in an effort to slow down any cognitive decline, daughter agrees.    Follow up in 6 months. Start donepezil 10 mg daily, side effects discussed Continue B12 supplements Recommend good control of her cardiovascular risk factors Continue to control mood as per PCP     Subjective:    This patient is accompanied in the office by her daughter who supplements the history.  Previous records as well as any outside records available were reviewed prior to todays visit. Patient was last seen on 10/16/2022 with a MoCA 15/30.   Any changes in memory since last visit? "About the same".  She has some difficulty remembering recent conversations, people's names.  Short-term memory is worse than long-term memory. Uses a calendar  repeats oneself?  Endorsed, during the visit she did repeat herself often. Disoriented when walking into a room?  Patient denies    Leaving objects?  May misplace things occasionally in unusual locations.   Wandering behavior?  She has wandering tendencies, she needs close monitoring.  There is considering may camera at the door for better monitoring. Any personality changes since last  visit?  As before, she has moments of irritability. Any worsening depression?:  Endorsed, after the death of her husband 10 years ago. Hallucinations or paranoia?  Endorsed, sometimes sees her children as young and is always worried about them.  She has seen some dead family members, for example her grandma Pansie. Seizures? denies    Any sleep changes?  Sometimes she may have an old sleeping pattern resulting in being up all night that most of the time she sleeps well. Denies vivid dreams, REM behavior or sleepwalking Sleep apnea?   Denies.   Any hygiene concerns? Denies.  Independent of bathing and dressing?  Endorsed  Does the patient needs help with medications?  Daughter is in charge  Who is in charge of the finances?  Daughter is in charge    Any changes in appetite?  He may forget that she did not eat so food has to be presented to her.   Patient have trouble swallowing? Denies.   Does the patient cook? No Any headaches?   denies   Chronic back pain  denies   Ambulates with difficulty?  Endorsed, one rolling off the bed, and one last night a mechanical fall and hit the "funny bone", no  LOC. She needs a cane for stability but does not like to use it.   Recent falls or head injuries? denies     Unilateral weakness, numbness or tingling? denies   Any tremors?  Denies   Any anosmia?  Denies   Any incontinence of urine?  Endorsed, refuses to wear diapers  Any bowel dysfunction?   Denies      Patient lives alone, has a Comptroller  for safety and companionship.    Does the patient drive? No longer drives    Initial visit June 2024  How long did patient have memory difficulties? Within the last 1.5 years especially after the aneurysm embolization. Patient has some difficulty remembering recent conversations and people names. STM worse that LTM.  repeats oneself?  Endorsed. During the visit she did repeat herself often. Disoriented when walking into a room?  Patient denies    Leaving objects  in unusual places?  denies   Wandering behavior?  One time a neighbor had to bring her back home because she was knocking on doors looking for her children.  Any personality changes ? Endorsed, over the last 6 months, with more irritability.   Any history of depression?: Endorsed, especially after her husband died 14 years ago.  Hallucinations or paranoia? Endorsed for the last 2 months. She saw her children as young. She always worries about the little children. She told her daughter that she was in a storage facility -she has not been there in 38 years-then realized that she was at home.  She has seen dead family members. "Grandma Pansie was visiting" Seizures? denies    Any sleep changes? She reports sleeping well, but her daughter reports that she has an odd sleeping pattern, sometimes she may be up all night.   Denies vivid dreams, REM behavior or sleepwalking   Sleep apnea? denies   Any hygiene concerns?  denies   Independent of bathing and dressing? Endorsed  Does the patient need help with medications? Daughter is in charge   Who is in charge of the finances? Daughter is in charge     Any changes in appetite?   Daughter says she eats one meal a day that she brings to her. She may have forgotten that she did not eat otherwise    Patient have trouble swallowing?  denies   Does the patient cook? No  Any headaches?  denies   Chronic pain? denies   Ambulates with difficulty?  Needs a cane but does not want to use it.  Recent falls or head injuries? Yesterday she fell on the yard hurting the fingers with thorns, but not hitting her head or LOC Vision changes? Denies Unilateral weakness, numbness or tingling? denies   Any tremors? denies   Any anosmia? denies   Any incontinence of urine? Endorsed, refuses to wear diapers  Any bowel dysfunction? denies      Patient lives alone.   History of heavy alcohol intake? denies   History of heavy tobacco use? denies   Family history of dementia?    Her mother and aunts with dementia AD Does patient drive? No     PREVIOUS MEDICATIONS:   CURRENT MEDICATIONS:  Outpatient Encounter Medications as of 03/06/2023  Medication Sig   amLODipine (NORVASC) 5 MG tablet TAKE 1 TABLET (5 MG TOTAL) BY MOUTH DAILY.   DULoxetine (CYMBALTA) 20 MG capsule TAKE 1 CAPSULE (20 MG TOTAL) BY MOUTH DAILY. IN ADDITION TO 60 MG PER DAY TO TOTAL 80 MG PER DAY   DULoxetine (CYMBALTA) 60 MG capsule TAKE 1 CAPSULE BY MOUTH TWICE A DAY (Patient taking differently: Take 60 mg by mouth daily.)   melatonin 5 MG TABS Take 5 mg by mouth at bedtime.   metoprolol succinate (TOPROL-XL) 25 MG 24 hr tablet TAKE 1 TABLET (25 MG TOTAL) BY MOUTH DAILY. STOP THE LOPRESSOR (TO AVOID TWICE A DAY DOSING.)   omeprazole (PRILOSEC) 40 MG capsule TAKE 1  CAPSULE BY MOUTH TWICE A DAY   Probiotic Product (ALIGN PO) Take 1 tablet by mouth daily.    rosuvastatin (CRESTOR) 40 MG tablet TAKE 1 TABLET BY MOUTH EVERY DAY   Suvorexant (BELSOMRA) 10 MG TABS Take 10 mg by mouth at bedtime. For sleep   No facility-administered encounter medications on file as of 03/06/2023.        No data to display            10/16/2022    4:00 PM  Montreal Cognitive Assessment   Visuospatial/ Executive (0/5) 1  Naming (0/3) 2  Attention: Read list of digits (0/2) 2  Attention: Read list of letters (0/1) 1  Attention: Serial 7 subtraction starting at 100 (0/3) 1  Language: Repeat phrase (0/2) 2  Language : Fluency (0/1) 1  Abstraction (0/2) 1  Delayed Recall (0/5) 0  Orientation (0/6) 3  Total 14  Adjusted Score (based on education) 15    Objective:     PHYSICAL EXAMINATION:    VITALS:   Vitals:   03/06/23 1140  BP: (!) 153/92  Pulse: 60  Resp: 20  SpO2: 99%  Weight: 129 lb (58.5 kg)  Height: 4\' 11"  (1.499 m)    GEN:  The patient appears stated age and is in NAD. HEENT:  Normocephalic, atraumatic.   Neurological examination:  General: NAD, well-groomed, appears stated  age. Orientation: The patient is alert. Oriented to person, not to place and date Cranial nerves: There is good facial symmetry.The speech is fluent and clear. No aphasia or dysarthria. Fund of knowledge is appropriate. Recent and remote memory are impaired. Attention and concentration are reduced.  Able to name objects and repeat phrases.  Hearing is intact to conversational tone.   Sensation: Sensation is intact to light touch throughout Motor: Strength is at least antigravity x4. DTR's 2/4 in UE/LE     Movement examination: Tone: There is normal tone in the UE/LE Abnormal movements:  no tremor.  No myoclonus.  No asterixis.   Coordination:  There is no decremation with RAM's. Normal finger to nose  Gait and Station: The patient has no difficulty arising out of a deep-seated chair without the use of the hands. The patient's stride length is good.  Gait is cautious and narrow. Uses a cane for stability    Thank you for allowing [place] the opportunity to participate in the care of this nice patient. Please do not hesitate to contact us for any questions or concerns.   Total time spent on today's visit was 31 minutes dedicated to this patient today, preparing to see patient, examining the patient, ordering tests and/or medications and counseling the patient, documenting clinical information in the EHR or other health record, independently interpreting results and communicating results to the patient/family, discussing treatment and goals, answering patient's questions and coordinating care.  Cc:  Panosh, Neta Mends, MD  Marlowe Kays 03/06/2023 11:56 AM

## 2023-05-11 ENCOUNTER — Other Ambulatory Visit: Payer: Self-pay | Admitting: Family

## 2023-06-13 ENCOUNTER — Other Ambulatory Visit: Payer: Self-pay | Admitting: Internal Medicine

## 2023-06-13 NOTE — Telephone Encounter (Signed)
 Attempted to reach pt about a follow up appointment from May of 2024. Left a voicemail to call us back.

## 2023-06-26 ENCOUNTER — Ambulatory Visit: Payer: Medicare HMO | Admitting: Physician Assistant

## 2023-06-26 ENCOUNTER — Encounter: Payer: Self-pay | Admitting: Physician Assistant

## 2023-06-26 VITALS — BP 152/74 | HR 67 | Resp 18 | Ht 59.0 in | Wt 122.0 lb

## 2023-06-26 DIAGNOSIS — F03918 Unspecified dementia, unspecified severity, with other behavioral disturbance: Secondary | ICD-10-CM | POA: Diagnosis not present

## 2023-06-26 MED ORDER — MEMANTINE HCL 10 MG PO TABS: ORAL_TABLET | ORAL | 3 refills | Status: DC

## 2023-06-26 NOTE — Patient Instructions (Addendum)
 It was a pleasure to see you today at our office.   Recommendations: Take half  tablet (5 mg at night) for 2 weeks, then increase to 1 tablet (5 mg) twice a day .Then increase to 1 tablet (10 mg)  twice a day  Discontinue donepezil  Continue B12 supplements  Drink more water  Follow up Sept 10 at  11:30    For assessment of decision of mental capacity and competency:  Call Dr. Erick Blinks, geriatric psychiatrist at [phone] Counseling regarding caregiver distress, including caregiver depression, anxiety and issues regarding community resources, adult day care programs, adult living facilities, or memory care questions:  please contact your  Primary Doctor's Social Worker   Whom to call: Memory  decline, memory medications: Call our office [phone]   For psychiatric meds, mood meds: Please have your primary care physician manage these medications.  If you have any severe symptoms of a stroke, or other severe issues such as confusion,severe chills or fever, etc call 911 or go to the ER as you may need to be evaluated further    RECOMMENDATIONS FOR ALL PATIENTS WITH MEMORY PROBLEMS: 1. Continue to exercise (Recommend 30 minutes of walking everyday, or 3 hours every week) 2. Increase social interactions - continue going to [place] and enjoy social gatherings with friends and family 3. Eat healthy, avoid fried foods and eat more fruits and vegetables 4. Maintain adequate blood pressure, blood sugar, and blood cholesterol level. Reducing the risk of stroke and cardiovascular disease also helps promoting better memory. 5. Avoid stressful situations. Live a simple life and avoid aggravations. Organize your time and prepare for the next day in anticipation. 6. Sleep well, avoid any interruptions of sleep and avoid any distractions in the bedroom that may interfere with adequate sleep quality 7. Avoid sugar, avoid sweets as there is a strong link between excessive sugar intake, diabetes,  and cognitive impairment We discussed the Mediterranean diet, which has been shown to help patients reduce the risk of progressive memory disorders and reduces cardiovascular risk. This includes eating fish, eat fruits and green leafy vegetables, nuts like almonds and hazelnuts, walnuts, and also use olive oil. Avoid fast foods and fried foods as much as possible. Avoid sweets and sugar as sugar use has been linked to worsening of memory function.  There is always a concern of gradual progression of memory problems. If this is the case, then we may need to adjust level of care according to patient needs. Support, both to the patient and caregiver, should then be put into place.       FALL PRECAUTIONS: Be cautious when walking. Scan the area for obstacles that may increase the risk of trips and falls. When getting up in the mornings, sit up at the edge of the bed for a few minutes before getting out of bed. Consider elevating the bed at the head end to avoid drop of blood pressure when getting up. Walk always in a well-lit room (use night lights in the walls). Avoid area rugs or power cords from appliances in the middle of the walkways. Use a walker or a cane if necessary and consider physical therapy for balance exercise. Get your eyesight checked regularly.  FINANCIAL OVERSIGHT: Supervision, especially oversight when making financial decisions or transactions is also recommended.  HOME SAFETY: Consider the safety of the kitchen when operating appliances like stoves, microwave oven, and blender. Consider having supervision and share cooking responsibilities until no longer able to participate in those.  Accidents with firearms and other hazards in the house should be identified and addressed as well.   ABILITY TO BE LEFT ALONE: If patient is unable to contact 911 operator, consider using LifeLine, or when the need is there, arrange for someone to stay with patients. Smoking is a fire hazard, consider  supervision or cessation. Risk of wandering should be assessed by caregiver and if detected at any point, supervision and safe proof recommendations should be instituted.  MEDICATION SUPERVISION: Inability to self-administer medication needs to be constantly addressed. Implement a mechanism to ensure safe administration of the medications.       Mediterranean Diet A Mediterranean diet refers to food and lifestyle choices that are based on the traditions of countries located on the [employer]. This way of eating has been shown to help prevent certain conditions and improve outcomes for people who have chronic diseases, like kidney disease and heart disease. What are tips for following this plan? Lifestyle  Cook and eat meals together with your family, when possible. Drink enough fluid to keep your urine clear or pale yellow. Be physically active every day. This includes: Aerobic exercise like running or swimming. Leisure activities like gardening, walking, or housework. Get 7-8 hours of sleep each night. If recommended by your health care provider, drink red wine in moderation. This means 1 glass a day for nonpregnant women and 2 glasses a day for men. A glass of wine equals 5 oz (150 mL). Reading food labels  Check the serving size of packaged foods. For foods such as rice and pasta, the serving size refers to the amount of cooked product, not dry. Check the total fat in packaged foods. Avoid foods that have saturated fat or trans fats. Check the ingredients list for added sugars, such as corn syrup. Shopping  At the grocery store, buy most of your food from the areas near the walls of the store. This includes: Fresh fruits and vegetables (produce). Grains, beans, nuts, and seeds. Some of these may be available in unpackaged forms or large amounts (in bulk). Fresh seafood. Poultry and eggs. Low-fat dairy products. Buy whole ingredients instead of prepackaged foods. Buy fresh  fruits and vegetables in-season from local farmers markets. Buy frozen fruits and vegetables in resealable bags. If you do not have access to quality fresh seafood, buy precooked frozen shrimp or canned fish, such as tuna, salmon, or sardines. Buy small amounts of raw or cooked vegetables, salads, or olives from the deli or salad bar at your store. Stock your pantry so you always have certain foods on hand, such as olive oil, canned tuna, canned tomatoes, rice, pasta, and beans. Cooking  Cook foods with extra-virgin olive oil instead of using butter or other vegetable oils. Have meat as a side dish, and have vegetables or grains as your main dish. This means having meat in small portions or adding small amounts of meat to foods like pasta or stew. Use beans or vegetables instead of meat in common dishes like chili or lasagna. Experiment with different cooking methods. Try roasting or broiling vegetables instead of steaming or sauteing them. Add frozen vegetables to soups, stews, pasta, or rice. Add nuts or seeds for added healthy fat at each meal. You can add these to yogurt, salads, or vegetable dishes. Marinate fish or vegetables using olive oil, lemon juice, garlic, and fresh herbs. Meal planning  Plan to eat 1 vegetarian meal one day each week. Try to work up to 2 vegetarian meals, if  possible. Eat seafood 2 or more times a week. Have healthy snacks readily available, such as: Vegetable sticks with hummus. Greek yogurt. Fruit and nut trail mix. Eat balanced meals throughout the week. This includes: Fruit: 2-3 servings a day Vegetables: 4-5 servings a day Low-fat dairy: 2 servings a day Fish, poultry, or lean meat: 1 serving a day Beans and legumes: 2 or more servings a week Nuts and seeds: 1-2 servings a day Whole grains: 6-8 servings a day Extra-virgin olive oil: 3-4 servings a day Limit red meat and sweets to only a few servings a month What are my food choices? Mediterranean  diet Recommended Grains: Whole-grain pasta. Brown rice. Bulgar wheat. Polenta. Couscous. Whole-wheat bread. Orpah Cobb. Vegetables: Artichokes. Beets. Broccoli. Cabbage. Carrots. Eggplant. Green beans. Chard. Kale. Spinach. Onions. Leeks. Peas. Squash. Tomatoes. Peppers. Radishes. Fruits: Apples. Apricots. Avocado. Berries. Bananas. Cherries. Dates. Figs. Grapes. Lemons. Melon. Oranges. Peaches. Plums. Pomegranate. Meats and other protein foods: Beans. Almonds. Sunflower seeds. Pine nuts. Peanuts. Cod. Salmon. Scallops. Shrimp. Tuna. Tilapia. Clams. Oysters. Eggs. Dairy: Low-fat milk. Cheese. Greek yogurt. Beverages: Water. Red wine. Herbal tea. Fats and oils: Extra virgin olive oil. Avocado oil. Grape seed oil. Sweets and desserts: [place] yogurt with honey. Baked apples. Poached pears. Trail mix. Seasoning and other foods: Basil. Cilantro. Coriander. Cumin. Mint. Parsley. Sage. Rosemary. Tarragon. Garlic. Oregano. Thyme. Pepper. Balsalmic vinegar. Tahini. Hummus. Tomato sauce. Olives. Mushrooms. Limit these Grains: Prepackaged pasta or rice dishes. Prepackaged cereal with added sugar. Vegetables: Deep fried potatoes (french fries). Fruits: Fruit canned in syrup. Meats and other protein foods: Beef. Pork. Lamb. Poultry with skin. Hot dogs. Tomasa Blase. Dairy: Ice cream. Sour cream. Whole milk. Beverages: Juice. Sugar-sweetened soft drinks. Beer. Liquor and spirits. Fats and oils: Butter. Canola oil. Vegetable oil. Beef fat (tallow). Lard. Sweets and desserts: Cookies. Cakes. Pies. Candy. Seasoning and other foods: Mayonnaise. Premade sauces and marinades. The items listed may not be a complete list. Talk with your dietitian about what dietary choices are right for you. Summary The Mediterranean diet includes both food and lifestyle choices. Eat a variety of fresh fruits and vegetables, beans, nuts, seeds, and whole grains. Limit the amount of red meat and sweets that you eat. Talk with your  health care provider about whether it is safe for you to drink red wine in moderation. This means 1 glass a day for nonpregnant women and 2 glasses a day for men. A glass of wine equals 5 oz (150 mL). This information is not intended to replace advice given to you by your health care provider. Make sure you discuss any questions you have with your health care provider. Document Released: 11/30/2015 Document Revised: 01/02/2016 Document Reviewed: 11/30/2015 Elsevier Interactive Patient Education  2017 [employer].      Labs today [address] MRI at [facility] [phone]

## 2023-06-26 NOTE — Progress Notes (Signed)
 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 RH female with a history of hypertension, B12 deficiency, arthritis, vitamin D deficiency, history of embolized aneurysm in 2021, anxiety, depression, fibromyalgia, GERD, CAD, CKD stage III seen today in follow up for memory loss.  Memory decline is noted. She is able to participate in most of her ADLs without difficulties, no longer drives.  She is on donepezil 10 mg daily, unable to tolerate well due to GI side effects and bad dreams.  Discussed discontinuing the medication and initiating memantine, titrating up to 10 mg twice daily, daughter agrees..   Follow up in  6 months. Discontinue donepezil 10 mg daily due to intolerance to the medicine (vivid dreams, diarrhea) Start memantine, titrating it up to 10 mg twice daily   continue B12 Recommend good control of her cardiovascular risk factors Continue to control mood as per PCP     Subjective:    This patient is accompanied in the office by her daughter who supplements the history.  Previous records as well as any outside records available were reviewed prior to todays visit. Patient was last seen on 03/06/2023 with [facility] 15/30    Any changes in memory since last visit? " It may be worse"-daughter says.  She has more issues with short-term memory, especially remembering new information.  Long-term memory is affected as well.   She has not been doing brain stimulating activities  repeats oneself?  Endorsed, she did so during this visit. Disoriented when walking into a room?  Patient denies    Leaving objects?  May misplace things in different locations per daughter's report, patient denies.    Wandering behavior?  She has wandering tendencies, needs close monitoring.  Family considering a camera at the door for safety.  Any personality changes since last visit?  Denies.   Any  worsening depression?:  Denies.  There are some moments of irritability as prior.  She does carry a history of depression after the death of her husband 10 years ago. Hallucinations or paranoia?  Sometimes she sees the children as when they were young, always worried about them,as well as "grandma Pansie" Seizures? denies    Any sleep changes?  As before, she continues to sleep during the day and being up all night (that was her old sleeping pattern).She reports "crazy" dreams, mostly about family or being in other places, denies REM behavior or sleepwalking .  Sleep apnea?   Denies.   Any hygiene concerns? Denies.  Independent of bathing and dressing?  Endorsed  Does the patient needs help with medications?  Daughter is in charge   Who is in charge of the finances?  Daughter is in charge     Any changes in appetite?  denies.  Admits to not drinking enough water     Patient have trouble swallowing? Denies.   Does the patient cook? No Any headaches?   denies   Chronic back pain  denies   Ambulates with difficulty? Denies.  She uses a cane for stability  Recent falls or head injuries? denies     Unilateral weakness, numbness or tingling? denies   Any tremors?  Denies   Any anosmia?  Denies   Any incontinence of urine?  Endorsed, refuses to wear diapers Any bowel dysfunction?   Denies      Patient lives alone, has a Comptroller for safety and companionship.  Does the patient drive? No longer drives     Initial visit June 2024  How long did patient have memory difficulties? Within the last 1.5 years especially after the aneurysm embolization. Patient has some difficulty remembering recent conversations and people names. STM worse that LTM.  repeats oneself?  Endorsed. During the visit she did repeat herself often. Disoriented when walking into a room?  Patient denies    Leaving objects in unusual places?  denies   Wandering behavior?  One time a neighbor had to bring her back home because she  was knocking on doors looking for her children.  Any personality changes ? Endorsed, over the last 6 months, with more irritability.   Any history of depression?: Endorsed, especially after her husband died 14 years ago.  Hallucinations or paranoia? Endorsed for the last 2 months. She saw her children as young. She always worries about the little children. She told her daughter that she was in a storage facility -she has not been there in 74 years-then realized that she was at home.  She has seen dead family members. "Grandma Pansie was visiting" Seizures? denies    Any sleep changes? She reports sleeping well, but her daughter reports that she has an odd sleeping pattern, sometimes she may be up all night.   Denies vivid dreams, REM behavior or sleepwalking   Sleep apnea? denies   Any hygiene concerns?  denies   Independent of bathing and dressing? Endorsed  Does the patient need help with medications? Daughter is in charge   Who is in charge of the finances? Daughter is in charge     Any changes in appetite?   Daughter says she eats one meal a day that she brings to her. She may have forgotten that she did not eat otherwise    Patient have trouble swallowing?  denies   Does the patient cook? No  Any headaches?  denies   Chronic pain? denies   Ambulates with difficulty?  Needs a cane but does not want to use it.  Recent falls or head injuries? Yesterday she fell on the yard hurting the fingers with thorns, but not hitting her head or LOC Vision changes? Denies Unilateral weakness, numbness or tingling? denies   Any tremors? denies   Any anosmia? denies   Any incontinence of urine? Endorsed, refuses to wear diapers  Any bowel dysfunction? denies      Patient lives alone.   History of heavy alcohol intake? denies   History of heavy tobacco use? denies   Family history of dementia?   Her mother and aunts with dementia AD Does patient drive? No    PREVIOUS MEDICATIONS: Donepezil (GI and  bad dreams)  CURRENT MEDICATIONS:  Outpatient Encounter Medications as of 06/26/2023  Medication Sig   amLODipine (NORVASC) 5 MG tablet TAKE 1 TABLET (5 MG TOTAL) BY MOUTH DAILY.   DULoxetine (CYMBALTA) 20 MG capsule TAKE 1 CAPSULE (20 MG TOTAL) BY MOUTH DAILY. IN ADDITION TO 60 MG PER DAY TO TOTAL 80 MG PER DAY   DULoxetine (CYMBALTA) 60 MG capsule TAKE 1 CAPSULE BY MOUTH TWICE A DAY (Patient taking differently: Take 60 mg by mouth daily.)   melatonin 5 MG TABS Take 5 mg by mouth at bedtime.   memantine (NAMENDA) 10 MG tablet Take half  tablet (5 mg at night) for 2 weeks, then increase to 1 tablet (5 mg) twice a day  Then increase to 1 tablet (10 mg)  twice a day  metoprolol succinate (TOPROL-XL) 25 MG 24 hr tablet TAKE 1 TABLET (25 MG TOTAL) BY MOUTH DAILY. STOP THE LOPRESSOR (TO AVOID TWICE A DAY DOSING.)   omeprazole (PRILOSEC) 40 MG capsule TAKE 1 CAPSULE BY MOUTH TWICE A DAY   Probiotic Product (ALIGN PO) Take 1 tablet by mouth daily.    rosuvastatin (CRESTOR) 40 MG tablet TAKE 1 TABLET BY MOUTH EVERY DAY   Suvorexant (BELSOMRA) 10 MG TABS Take 10 mg by mouth at bedtime. For sleep   [DISCONTINUED] donepezil (ARICEPT) 10 MG tablet Take half tablet (5 mg) daily for 2 weeks, then increase to the full tablet at 10 mg daily   No facility-administered encounter medications on file as of 06/26/2023.        No data to display            10/16/2022    4:00 PM  Montreal Cognitive Assessment   Visuospatial/ Executive (0/5) 1  Naming (0/3) 2  Attention: Read list of digits (0/2) 2  Attention: Read list of letters (0/1) 1  Attention: Serial 7 subtraction starting at 100 (0/3) 1  Language: Repeat phrase (0/2) 2  Language : Fluency (0/1) 1  Abstraction (0/2) 1  Delayed Recall (0/5) 0  Orientation (0/6) 3  Total 14  Adjusted Score (based on education) 15    Objective:     PHYSICAL EXAMINATION:    VITALS:   Vitals:   06/26/23 1129  BP: (!) 152/74  Pulse: 67  Resp: 18  SpO2:  98%  Weight: 122 lb (55.3 kg)  Height: 4\' 11"  (1.499 m)    GEN:  The patient appears stated age and is in NAD. HEENT:  Normocephalic, atraumatic.   Neurological examination:  General: NAD, well-groomed, appears stated age. Orientation: The patient is alert. Oriented to person, not place and date Cranial nerves: There is good facial symmetry.The speech is fluent and clear. No aphasia or dysarthria. Fund of knowledge is reduced. Recent and remote memory are impaired. Attention and concentration are reduced.  Able to name objects and repeat phrases.  Hearing is intact to conversational tone.   Sensation: Sensation is intact to light touch throughout Motor: Strength is at least antigravity x4. DTR's 2/4 in UE/LE     Movement examination: Tone: There is normal tone in the UE/LE Abnormal movements:  no tremor.  No myoclonus.  No asterixis.   Coordination:  There is no decremation with RAM's. Normal finger to nose  Gait and Station: The patient has no  difficulty arising out of a deep-seated chair without the use of the hands. The patient's stride length is good.  Gait is cautious and narrow, uses a cane for stability.    Thank you for allowing [place] the opportunity to participate in the care of this nice patient. Please do not hesitate to contact us for any questions or concerns.   Total time spent on today's visit was 39 minutes dedicated to this patient today, preparing to see patient, examining the patient, ordering tests and/or medications and counseling the patient, documenting clinical information in the EHR or other health record, independently interpreting results and communicating results to the patient/family, discussing treatment and goals, answering patient's questions and coordinating care.  Cc:  Panosh, Neta Mends, MD  Marlowe Kays 06/26/2023 12:38 PM  ,

## 2023-09-19 ENCOUNTER — Ambulatory Visit: Admitting: Podiatry

## 2023-09-29 ENCOUNTER — Ambulatory Visit: Admitting: Podiatry

## 2023-09-29 ENCOUNTER — Encounter: Payer: Self-pay | Admitting: Podiatry

## 2023-09-29 DIAGNOSIS — B351 Tinea unguium: Secondary | ICD-10-CM | POA: Diagnosis not present

## 2023-09-29 DIAGNOSIS — M79674 Pain in right toe(s): Secondary | ICD-10-CM

## 2023-09-29 DIAGNOSIS — M79675 Pain in left toe(s): Secondary | ICD-10-CM

## 2023-09-29 DIAGNOSIS — L853 Xerosis cutis: Secondary | ICD-10-CM

## 2023-10-01 NOTE — Progress Notes (Signed)
 Subjective:   Patient ID: [name] [name], female   DOB: 83 y.o.   MRN: [id number]   HPI Chief Complaint  Patient presents with   Nail Problem    RM# Patient presents today with nail concerns states long and discolored.   83 year old female presents the office today with concerns of thick, elongated nails that she is not able to trim herself.  They have not been trimmed in some time they are quite long.  She also has dry skin present.  She does not report any open lesions or any drainage.   Review of Systems  All other systems reviewed and are negative.       Objective:  Physical Exam  General: NAD- family member present.   Dermatological: All the nails are quite hypertrophic, dystrophic and significantly elongated with yellow discoloration and subungual debris present.  There is no edema, erythema or signs of infection of the toenail sites.  There is dry skin present bilaterally without any skin fissures or open lesions.  Vascular: Dorsalis Pedis artery and Posterior Tibial artery pedal pulses are 2/4 bilateral with immedate capillary fill time.  There is no pain with calf compression, swelling, warmth, erythema.   Neruologic: Grossly intact via light touch bilateral.   Musculoskeletal: Tenderness of the toenails.  No other areas of discomfort noted.     Assessment:   83 year old female with symptomatic onychomycosis, dry skin     Plan:  -Treatment options discussed including all alternatives, risks, and complications -Etiology of symptoms were discussed -Nails debrided 10 without complications or bleeding. -Discussed moisturizers to apply to the feet daily.  -Daily foot inspection -Follow-up in 3 months or sooner if any problems arise. In the meantime, encouraged to call the office with any questions, concerns, change in symptoms.   Bobbie Burows, DPM

## 2023-11-12 ENCOUNTER — Ambulatory Visit: Admitting: Physician Assistant

## 2023-11-19 ENCOUNTER — Telehealth: Payer: Self-pay | Admitting: Physician Assistant

## 2023-11-19 NOTE — Telephone Encounter (Signed)
 She needs to contact her medical insurance to see if this is an option, if so we can try and order home health.

## 2023-11-19 NOTE — Telephone Encounter (Signed)
 Pt's daughter called in wanting to see what resources are available to her in regards to home health care. She is afraid her mom won't be able to live by herself in the next 6 months or so and she is not sure where to start.

## 2023-11-20 NOTE — Telephone Encounter (Signed)
 She will call me back and I will ask for home health referral when requested to get order from provider at that time.

## 2023-12-01 ENCOUNTER — Inpatient Hospital Stay (HOSPITAL_COMMUNITY)
Admission: EM | Admit: 2023-12-01 | Discharge: 2023-12-05 | DRG: 064 | Disposition: A | Discharge status: Rehabilitation Facility | Attending: Neurology | Admitting: Neurology

## 2023-12-01 ENCOUNTER — Emergency Department (HOSPITAL_COMMUNITY)

## 2023-12-01 ENCOUNTER — Other Ambulatory Visit: Payer: Self-pay

## 2023-12-01 ENCOUNTER — Encounter (HOSPITAL_COMMUNITY): Payer: Self-pay

## 2023-12-01 DIAGNOSIS — M79661 Pain in right lower leg: Secondary | ICD-10-CM | POA: Diagnosis not present

## 2023-12-01 DIAGNOSIS — R410 Disorientation, unspecified: Secondary | ICD-10-CM | POA: Diagnosis present

## 2023-12-01 DIAGNOSIS — Z8249 Family history of ischemic heart disease and other diseases of the circulatory system: Secondary | ICD-10-CM | POA: Diagnosis not present

## 2023-12-01 DIAGNOSIS — G309 Alzheimer's disease, unspecified: Secondary | ICD-10-CM | POA: Diagnosis not present

## 2023-12-01 DIAGNOSIS — Z888 Allergy status to other drugs, medicaments and biological substances status: Secondary | ICD-10-CM

## 2023-12-01 DIAGNOSIS — R531 Weakness: Principal | ICD-10-CM | POA: Diagnosis present

## 2023-12-01 DIAGNOSIS — I69120 Aphasia following nontraumatic intracerebral hemorrhage: Secondary | ICD-10-CM | POA: Diagnosis not present

## 2023-12-01 DIAGNOSIS — Z96611 Presence of right artificial shoulder joint: Secondary | ICD-10-CM | POA: Diagnosis present

## 2023-12-01 DIAGNOSIS — K219 Gastro-esophageal reflux disease without esophagitis: Secondary | ICD-10-CM | POA: Diagnosis present

## 2023-12-01 DIAGNOSIS — F419 Anxiety disorder, unspecified: Secondary | ICD-10-CM | POA: Diagnosis present

## 2023-12-01 DIAGNOSIS — E859 Amyloidosis, unspecified: Secondary | ICD-10-CM | POA: Diagnosis not present

## 2023-12-01 DIAGNOSIS — R4701 Aphasia: Secondary | ICD-10-CM | POA: Diagnosis present

## 2023-12-01 DIAGNOSIS — I739 Peripheral vascular disease, unspecified: Secondary | ICD-10-CM | POA: Diagnosis not present

## 2023-12-01 DIAGNOSIS — M8580 Other specified disorders of bone density and structure, unspecified site: Secondary | ICD-10-CM | POA: Diagnosis present

## 2023-12-01 DIAGNOSIS — R1310 Dysphagia, unspecified: Secondary | ICD-10-CM | POA: Diagnosis present

## 2023-12-01 DIAGNOSIS — I680 Cerebral amyloid angiopathy: Secondary | ICD-10-CM | POA: Diagnosis not present

## 2023-12-01 DIAGNOSIS — R29706 NIHSS score 6: Secondary | ICD-10-CM | POA: Diagnosis not present

## 2023-12-01 DIAGNOSIS — I671 Cerebral aneurysm, nonruptured: Secondary | ICD-10-CM | POA: Diagnosis present

## 2023-12-01 DIAGNOSIS — Z79899 Other long term (current) drug therapy: Secondary | ICD-10-CM | POA: Diagnosis not present

## 2023-12-01 DIAGNOSIS — Z91041 Radiographic dye allergy status: Secondary | ICD-10-CM | POA: Diagnosis not present

## 2023-12-01 DIAGNOSIS — I10 Essential (primary) hypertension: Secondary | ICD-10-CM | POA: Diagnosis present

## 2023-12-01 DIAGNOSIS — G936 Cerebral edema: Secondary | ICD-10-CM | POA: Diagnosis present

## 2023-12-01 DIAGNOSIS — F450 Somatization disorder: Secondary | ICD-10-CM | POA: Diagnosis present

## 2023-12-01 DIAGNOSIS — I672 Cerebral atherosclerosis: Secondary | ICD-10-CM | POA: Diagnosis not present

## 2023-12-01 DIAGNOSIS — I610 Nontraumatic intracerebral hemorrhage in hemisphere, subcortical: Secondary | ICD-10-CM | POA: Diagnosis present

## 2023-12-01 DIAGNOSIS — E785 Hyperlipidemia, unspecified: Secondary | ICD-10-CM | POA: Diagnosis present

## 2023-12-01 DIAGNOSIS — Z8673 Personal history of transient ischemic attack (TIA), and cerebral infarction without residual deficits: Secondary | ICD-10-CM | POA: Diagnosis not present

## 2023-12-01 DIAGNOSIS — F0390 Unspecified dementia without behavioral disturbance: Secondary | ICD-10-CM | POA: Diagnosis not present

## 2023-12-01 DIAGNOSIS — N189 Chronic kidney disease, unspecified: Secondary | ICD-10-CM | POA: Diagnosis not present

## 2023-12-01 DIAGNOSIS — Z882 Allergy status to sulfonamides status: Secondary | ICD-10-CM

## 2023-12-01 DIAGNOSIS — Z933 Colostomy status: Secondary | ICD-10-CM | POA: Diagnosis not present

## 2023-12-01 DIAGNOSIS — I2510 Atherosclerotic heart disease of native coronary artery without angina pectoris: Secondary | ICD-10-CM | POA: Diagnosis present

## 2023-12-01 DIAGNOSIS — M797 Fibromyalgia: Secondary | ICD-10-CM | POA: Diagnosis present

## 2023-12-01 DIAGNOSIS — Z634 Disappearance and death of family member: Secondary | ICD-10-CM

## 2023-12-01 DIAGNOSIS — K5901 Slow transit constipation: Secondary | ICD-10-CM | POA: Diagnosis not present

## 2023-12-01 DIAGNOSIS — R29701 NIHSS score 1: Secondary | ICD-10-CM | POA: Diagnosis not present

## 2023-12-01 DIAGNOSIS — I129 Hypertensive chronic kidney disease with stage 1 through stage 4 chronic kidney disease, or unspecified chronic kidney disease: Secondary | ICD-10-CM | POA: Diagnosis not present

## 2023-12-01 DIAGNOSIS — F0393 Unspecified dementia, unspecified severity, with mood disturbance: Secondary | ICD-10-CM | POA: Diagnosis present

## 2023-12-01 DIAGNOSIS — I639 Cerebral infarction, unspecified: Secondary | ICD-10-CM | POA: Diagnosis present

## 2023-12-01 DIAGNOSIS — I69198 Other sequelae of nontraumatic intracerebral hemorrhage: Secondary | ICD-10-CM | POA: Diagnosis not present

## 2023-12-01 DIAGNOSIS — N3010 Interstitial cystitis (chronic) without hematuria: Secondary | ICD-10-CM | POA: Diagnosis present

## 2023-12-01 DIAGNOSIS — Z87442 Personal history of urinary calculi: Secondary | ICD-10-CM

## 2023-12-01 DIAGNOSIS — I619 Nontraumatic intracerebral hemorrhage, unspecified: Secondary | ICD-10-CM | POA: Diagnosis present

## 2023-12-01 DIAGNOSIS — M79604 Pain in right leg: Secondary | ICD-10-CM | POA: Diagnosis not present

## 2023-12-01 DIAGNOSIS — F02B0 Dementia in other diseases classified elsewhere, moderate, without behavioral disturbance, psychotic disturbance, mood disturbance, and anxiety: Secondary | ICD-10-CM | POA: Diagnosis not present

## 2023-12-01 DIAGNOSIS — I6622 Occlusion and stenosis of left posterior cerebral artery: Secondary | ICD-10-CM | POA: Diagnosis present

## 2023-12-01 DIAGNOSIS — S06310A Contusion and laceration of right cerebrum without loss of consciousness, initial encounter: Secondary | ICD-10-CM | POA: Diagnosis not present

## 2023-12-01 DIAGNOSIS — R29702 NIHSS score 2: Secondary | ICD-10-CM | POA: Diagnosis present

## 2023-12-01 DIAGNOSIS — G8194 Hemiplegia, unspecified affecting left nondominant side: Secondary | ICD-10-CM | POA: Diagnosis not present

## 2023-12-01 DIAGNOSIS — Z8679 Personal history of other diseases of the circulatory system: Secondary | ICD-10-CM

## 2023-12-01 DIAGNOSIS — I6389 Other cerebral infarction: Secondary | ICD-10-CM | POA: Diagnosis not present

## 2023-12-01 DIAGNOSIS — I611 Nontraumatic intracerebral hemorrhage in hemisphere, cortical: Secondary | ICD-10-CM | POA: Diagnosis not present

## 2023-12-01 DIAGNOSIS — R9082 White matter disease, unspecified: Secondary | ICD-10-CM | POA: Diagnosis not present

## 2023-12-01 DIAGNOSIS — R221 Localized swelling, mass and lump, neck: Secondary | ICD-10-CM | POA: Diagnosis not present

## 2023-12-01 DIAGNOSIS — I6523 Occlusion and stenosis of bilateral carotid arteries: Secondary | ICD-10-CM | POA: Diagnosis not present

## 2023-12-01 DIAGNOSIS — Z96653 Presence of artificial knee joint, bilateral: Secondary | ICD-10-CM | POA: Diagnosis present

## 2023-12-01 DIAGNOSIS — F32A Depression, unspecified: Secondary | ICD-10-CM | POA: Diagnosis present

## 2023-12-01 DIAGNOSIS — Z85828 Personal history of other malignant neoplasm of skin: Secondary | ICD-10-CM | POA: Diagnosis not present

## 2023-12-01 DIAGNOSIS — R4182 Altered mental status, unspecified: Secondary | ICD-10-CM | POA: Diagnosis not present

## 2023-12-01 DIAGNOSIS — I69391 Dysphagia following cerebral infarction: Secondary | ICD-10-CM | POA: Diagnosis not present

## 2023-12-01 DIAGNOSIS — R569 Unspecified convulsions: Secondary | ICD-10-CM | POA: Diagnosis not present

## 2023-12-01 DIAGNOSIS — R29818 Other symptoms and signs involving the nervous system: Secondary | ICD-10-CM | POA: Diagnosis not present

## 2023-12-01 DIAGNOSIS — I629 Nontraumatic intracranial hemorrhage, unspecified: Secondary | ICD-10-CM | POA: Diagnosis not present

## 2023-12-01 DIAGNOSIS — R262 Difficulty in walking, not elsewhere classified: Secondary | ICD-10-CM | POA: Diagnosis present

## 2023-12-01 DIAGNOSIS — R159 Full incontinence of feces: Secondary | ICD-10-CM | POA: Diagnosis not present

## 2023-12-01 DIAGNOSIS — Z8262 Family history of osteoporosis: Secondary | ICD-10-CM

## 2023-12-01 DIAGNOSIS — E739 Lactose intolerance, unspecified: Secondary | ICD-10-CM | POA: Diagnosis present

## 2023-12-01 DIAGNOSIS — R0902 Hypoxemia: Secondary | ICD-10-CM | POA: Diagnosis not present

## 2023-12-01 DIAGNOSIS — Z886 Allergy status to analgesic agent status: Secondary | ICD-10-CM

## 2023-12-01 DIAGNOSIS — M419 Scoliosis, unspecified: Secondary | ICD-10-CM | POA: Diagnosis present

## 2023-12-01 DIAGNOSIS — Z743 Need for continuous supervision: Secondary | ICD-10-CM | POA: Diagnosis not present

## 2023-12-01 DIAGNOSIS — M1990 Unspecified osteoarthritis, unspecified site: Secondary | ICD-10-CM | POA: Diagnosis present

## 2023-12-01 LAB — URINALYSIS, W/ REFLEX TO CULTURE (INFECTION SUSPECTED)
Bacteria, UA: NONE SEEN
Bilirubin Urine: NEGATIVE
Glucose, UA: NEGATIVE mg/dL
Hgb urine dipstick: NEGATIVE
Ketones, ur: NEGATIVE mg/dL
Leukocytes,Ua: NEGATIVE
Nitrite: NEGATIVE
Protein, ur: NEGATIVE mg/dL
Specific Gravity, Urine: 1.018 (ref 1.005–1.030)
pH: 5 (ref 5.0–8.0)

## 2023-12-01 LAB — CBC WITH DIFFERENTIAL/PLATELET
Abs Immature Granulocytes: 0 K/uL (ref 0.00–0.07)
Basophils Absolute: 0 K/uL (ref 0.0–0.1)
Basophils Relative: 1 %
Eosinophils Absolute: 0.1 K/uL (ref 0.0–0.5)
Eosinophils Relative: 3 %
HCT: 42 % (ref 36.0–46.0)
Hemoglobin: 13.6 g/dL (ref 12.0–15.0)
Immature Granulocytes: 0 %
Lymphocytes Relative: 27 %
Lymphs Abs: 1.1 K/uL (ref 0.7–4.0)
MCH: 29.9 pg (ref 26.0–34.0)
MCHC: 32.4 g/dL (ref 30.0–36.0)
MCV: 92.3 fL (ref 80.0–100.0)
Monocytes Absolute: 0.3 K/uL (ref 0.1–1.0)
Monocytes Relative: 8 %
Neutro Abs: 2.5 K/uL (ref 1.7–7.7)
Neutrophils Relative %: 61 %
Platelets: 177 K/uL (ref 150–400)
RBC: 4.55 MIL/uL (ref 3.87–5.11)
RDW: 13.4 % (ref 11.5–15.5)
WBC: 4.2 K/uL (ref 4.0–10.5)
nRBC: 0 % (ref 0.0–0.2)

## 2023-12-01 LAB — COMPREHENSIVE METABOLIC PANEL WITH GFR
ALT: 11 U/L (ref 0–44)
AST: 19 U/L (ref 15–41)
Albumin: 3.6 g/dL (ref 3.5–5.0)
Alkaline Phosphatase: 49 U/L (ref 38–126)
Anion gap: 7 (ref 5–15)
BUN: 19 mg/dL (ref 8–23)
CO2: 25 mmol/L (ref 22–32)
Calcium: 9.2 mg/dL (ref 8.9–10.3)
Chloride: 106 mmol/L (ref 98–111)
Creatinine, Ser: 0.95 mg/dL (ref 0.44–1.00)
GFR, Estimated: 60 mL/min — ABNORMAL LOW (ref >60)
Glucose, Bld: 96 mg/dL (ref 70–99)
Potassium: 3.8 mmol/L (ref 3.5–5.1)
Sodium: 138 mmol/L (ref 135–145)
Total Bilirubin: 0.4 mg/dL (ref 0.0–1.2)
Total Protein: 6.7 g/dL (ref 6.5–8.1)

## 2023-12-01 NOTE — ED Notes (Signed)
 Assisted pt to restroom. Pt is one person assist, shift-pivot.

## 2023-12-01 NOTE — ED Provider Notes (Signed)
 [place] EMERGENCY DEPARTMENT AT [facility] Provider Note  CSN: [id number] Arrival date & time: 12/01/23 1721  Chief Complaint(s) Aphasia  HPI [name] [name] is a 83 y.o. female history of dementia, CKD, prior aneurysm, hypertension, lipidemia presenting to the emergency department with generalized weakness, confusion.  Patient's family reports that she has had decline over the past 2 weeks, seem to be speaking less, he did more help with daily activities.  Patient lives alone.  No fevers or chills, nausea or vomiting, chest pain, painful urination.  Has had some gait problems, needing to use a walker more.  Family is concerned something could be going on.  Patient reports that she has been very sad because her sister died 2 months ago and she cries most the time she is alone.  Denies any thoughts of wanting to hurt herself.   Past Medical History Past Medical History:  Diagnosis Date   Acute kidney failure (HCC)    5 years ago   ACUTE KIDNEY FAILURE UNSPECIFIED 07/12/2009   Allergy    Anemia    Anxiety    Arthritis    Basal cell carcinoma    Blood clot in vein    states it was not deep vein   CAD (coronary artery disease)    Mild nonobstructive by CT   Cataract    Cerebral aneurysm without rupture 2021   Chronic kidney disease    stones   Depression    around the time of her husband's death   Diverticulosis of colon (without mention of hemorrhage)    Fibromyalgia    GERD (gastroesophageal reflux disease)    Headache    Hiatal hernia    History of kidney stones    Hyperlipidemia    Hypertension    Interstitial cystitis    Kidney stone    Osteopenia    PONV (postoperative nausea and vomiting)    Scoliosis    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   Qualifier: Diagnosis of  By: Jakie MD NOLIA Alm SAUNDERS    Vertigo    Patient Active Problem List   Diagnosis Date Noted   Memory impairment 10/16/2022   Brain aneurysm 12/17/2019   Cerebral aneurysm 12/17/2019    Altered bowel function 12/15/2019   S/P knee replacement 11/17/2015   Primary osteoarthritis of right knee 11/17/2015   S/P shoulder replacement 08/12/2014   Primary osteoarthritis involving multiple joints 03/21/2014   CKD (chronic kidney disease), stage III (HCC) 12/16/2013   Medication management 09/17/2013   Visit for preventive health examination 06/16/2013   Hyperglycemia 06/16/2013   Agatston coronary artery calcium  score between 200 and 399 04/13/2012   Breast lump on left side at 9 o'clock position 02/12/2012   Skin lesion 02/12/2012   Vitamin B 12 deficiency 11/14/2011   Gynecomastia 07/18/2011   Fatigue 06/01/2011   Chest tightness or pressure 06/01/2011   Sleep difficulties 03/24/2011   Intentional underdosing of medication regimen by patient due to financial hardship 03/24/2011   ABDOMINAL BLOATING 10/10/2009   LUMP OR MASS IN BREAST 08/28/2009   ANEMIA 07/26/2009   ABDOMINAL PAIN OTHER SPECIFIED SITE 07/12/2009   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/2009   RENAL CALCULUS, HX OF 01/04/2009   IRON DEFICIENCY 11/15/2008   FIBROMYALGIA 11/15/2008   VITAMIN D  DEFICIENCY 10/10/2008   ANEMIA-B12 DEFICIENCY 05/10/2008   ADJ DISORDER WITH MIXED ANXIETY & DEPRESSED MOOD 05/05/2008   DYSPHAGIA UNSPECIFIED 05/02/2008   History of IBS 05/02/2008   VARICOSE VEINS, LOWER EXTREMITIES 04/12/2008   IBS  04/12/2008   OSTEOPENIA 04/12/2008   Adjustment disorder with depressed mood 02/16/2008   CHEST PAIN 02/16/2008   Hyperlipidemia 12/24/2006   ANXIETY 12/24/2006   Essential hypertension 12/24/2006   GERD 12/24/2006   HIATAL HERNIA 12/24/2006   Osteoarthritis 12/24/2006   Home Medication(s) Prior to Admission medications   Medication Sig Start Date End Date Taking? Authorizing Provider  amLODipine  (NORVASC ) 5 MG tablet TAKE 1 TABLET (5 MG TOTAL) BY MOUTH DAILY. 11/03/22   Webb, Padonda B, FNP  DULoxetine  (CYMBALTA ) 20 MG capsule TAKE 1 CAPSULE (20 MG TOTAL) BY MOUTH DAILY. IN ADDITION TO 60  MG PER DAY TO TOTAL 80 MG PER DAY 08/08/22   Webb, Padonda B, FNP  DULoxetine  (CYMBALTA ) 60 MG capsule TAKE 1 CAPSULE BY MOUTH TWICE A DAY Patient taking differently: Take 60 mg by mouth daily. 08/08/22   Webb, Padonda B, FNP  melatonin 5 MG TABS Take 5 mg by mouth at bedtime.    [provider]  memantine  (NAMENDA ) 10 MG tablet Take half  tablet (5 mg at night) for 2 weeks, then increase to 1 tablet (5 mg) twice a day  Then increase to 1 tablet (10 mg)  twice a day 06/26/23   Wertman, Sara E, PA-C  metoprolol  succinate (TOPROL -XL) 25 MG 24 hr tablet TAKE 1 TABLET (25 MG TOTAL) BY MOUTH DAILY. STOP THE LOPRESSOR  (TO AVOID TWICE A DAY DOSING.) 06/13/23   Panosh, Apolinar POUR, MD  omeprazole  (PRILOSEC) 40 MG capsule TAKE 1 CAPSULE BY MOUTH TWICE A DAY 10/17/22   Avram Lupita BRAVO, MD  Probiotic Product (ALIGN PO) Take 1 tablet by mouth daily.     [provider]  rosuvastatin  (CRESTOR ) 40 MG tablet TAKE 1 TABLET BY MOUTH EVERY DAY 07/22/22   Webb, Padonda B, FNP  Suvorexant  (BELSOMRA ) 10 MG TABS Take 10 mg by mouth at bedtime. For sleep 09/03/21   Panosh, Apolinar POUR, MD                                                                                                                                    Past Surgical History Past Surgical History:  Procedure Laterality Date   BLADDER TUMOR EXCISION  2001   CATARACT EXTRACTION     bilateral   CHOLECYSTECTOMY     COLOSTOMY     CYSTECTOMY  2001   benign   ESOPHAGEAL MANOMETRY N/A 07/13/2012   Procedure: ESOPHAGEAL MANOMETRY (EM);  Surgeon: Alm JONELLE Gander, MD;  Location: [facility];  Service: Endoscopy;  Laterality: N/A;   HIATAL HERNIA REPAIR     IR ANGIO INTRA EXTRACRAN SEL INTERNAL CAROTID BILAT MOD SED  11/02/2019   IR ANGIO INTRA EXTRACRAN SEL INTERNAL CAROTID BILAT MOD SED  10/31/2020   IR ANGIO VERTEBRAL SEL VERTEBRAL BILAT MOD SED  12/17/2019   IR ANGIO VERTEBRAL SEL VERTEBRAL UNI L MOD SED  10/31/2020   IR ANGIO VERTEBRAL SEL VERTEBRAL  UNI R MOD SED  11/02/2019   IR ANGIOGRAM FOLLOW UP STUDY  12/17/2019   IR ANGIOGRAM FOLLOW UP STUDY  12/17/2019   IR ANGIOGRAM FOLLOW UP STUDY  12/17/2019   IR ANGIOGRAM FOLLOW UP STUDY  12/17/2019   IR NEURO EACH ADD'L AFTER BASIC UNI LEFT (MS)  12/17/2019   IR TRANSCATH/EMBOLIZ  12/17/2019   LUMBAR DISC SURGERY     x 2 elsner   RADIOLOGY WITH ANESTHESIA N/A 12/17/2019   Procedure: 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;  Surgeon: Lanis Pupa, MD;  [facility] OR;  Service: Radiology;  Laterality: N/A;   REVERSE SHOULDER ARTHROPLASTY Right 08/12/2014   Procedure: RIGHT REVERSE TOTAL SHOULDER ARTHROPLASTY;  Surgeon: Marcey Her, MD;  Location: [facility] OR;  Service: Orthopedics;  Laterality: Right;   TENDON REPAIR Right 11/2005   right shoulder   TOTAL KNEE ARTHROPLASTY Left    left   TOTAL KNEE ARTHROPLASTY Right 11/17/2015   Procedure: RIGHT TOTAL KNEE ARTHROPLASTY;  Surgeon: Lamar Collet, MD;  Location: [facility] ORS;  Service: Orthopedics;  Laterality: Right;   UPPER GASTROINTESTINAL ENDOSCOPY     Family History Family History  Problem Relation Age of Onset   Alzheimer's disease Mother    Lung cancer Father        tobacco   Kidney cancer Son    Stroke Son    Arthritis Son    Hypertension Brother        mom's side   Osteoporosis Sister    Hypertension Sister    Breast cancer Cousin    Heart disease Maternal Uncle        x 4   Heart disease Maternal Aunt        x 4   Diabetes Paternal Grandmother    Diabetes Paternal Uncle        x 2   Arthritis Daughter    Colon cancer Neg Hx     Social History Social History   Tobacco Use   Smoking status: Never   Smokeless tobacco: Never  Vaping Use   Vaping status: Never Used  Substance Use Topics   Alcohol use: Not Currently    Alcohol/week: 0.0 standard drinks of alcohol    Comment: rare   Drug use: No   Allergies Gadolinium derivatives, Aspirin , Lactose intolerance (gi), Meloxicam, Metoprolol ,  and Sulfamethoxazole  Review of Systems Review of Systems  All other systems reviewed and are negative.   Physical Exam Vital Signs  I have reviewed the triage vital signs BP (!) 151/82   Pulse (!) 56   Temp 98.3 F (36.8 C) (Oral)   Resp 19   SpO2 98%  Physical Exam Vitals and nursing note reviewed.  Constitutional:      General: She is not in acute distress.    Appearance: She is well-developed.  HENT:     Head: Normocephalic and atraumatic.     Mouth/Throat:     Mouth: Mucous membranes are moist.  Eyes:     Pupils: Pupils are equal, round, and reactive to light.  Cardiovascular:     Rate and Rhythm: Normal rate and regular rhythm.     Heart sounds: No murmur heard. Pulmonary:     Effort: Pulmonary effort is normal. No respiratory distress.     Breath sounds: Normal breath sounds.  Abdominal:     General: Abdomen is flat.     Palpations: Abdomen is soft.     Tenderness: There is no abdominal tenderness.  Musculoskeletal:  General: No tenderness.     Right lower leg: No edema.     Left lower leg: No edema.  Skin:    General: Skin is warm and dry.  Neurological:     General: No focal deficit present.     Mental Status: She is alert. Mental status is at baseline.     Comments: Cranial nerves II through XII intact, strength 5 out of 5 in the bilateral upper and lower extremities, no sensory deficit to light touch, no dysmetria on finger-nose-finger testing, ambulatory with steady gait.  Fluent speech, no aphasia, no slurred speech.  Patient oriented to self and situation as well as place, not oriented to year.   Psychiatric:        Mood and Affect: Mood normal.        Behavior: Behavior normal.     ED Results and Treatments Labs (all labs ordered are listed, but only abnormal results are displayed) Labs Reviewed  COMPREHENSIVE METABOLIC PANEL WITH GFR - Abnormal; Notable for the following components:      Result Value   GFR, Estimated 60 (*)    All  other components within normal limits  CBC WITH DIFFERENTIAL/PLATELET  URINALYSIS, W/ REFLEX TO CULTURE (INFECTION SUSPECTED)                                                                                                                          Radiology No results found.  Pertinent labs & imaging results that were available during my care of the patient were reviewed by me and considered in my medical decision making (see MDM for details).  Medications Ordered in ED Medications - No data to display                                                                                                                                   Procedures Procedures  (including critical care time)  Medical Decision Making / ED Course   MDM:  83 year old presenting to the emergency department with weakness.  Patient is overall well-appearing, vital signs are reassuring.  Examination is nonfocal.  She is oriented other than date and speaking fluently.  Family are a bit surprised and reports that this is the most she said in 2 weeks.  Differential includes toxic or metabolic abnormality, however laboratory testing is overall reassuring.  Patient has no history of cirrhosis or thyroid  dysfunction to  suggest these.  Differential also includes intracranial process such as stroke however neurologic examination is overall reassuring without focal abnormality.  Will check CT head.  Patient also endorses feeling very depressed recently, could possibly be related to depression in the setting of her sister dying.  Will reassess.  Also check urinalysis as family concerned about UTI but patient denies urinary symptoms.  Clinical Course as of 12/01/23 2337  Mon Dec 01, 2023  2336 Signed out to Dr. Nettie pending CT head. UA reassuring.  [WS]    Clinical Course User Index [WS] Francesca Elsie CROME, MD     Additional history obtained: -Additional history obtained from son, daughter -External records from  outside source obtained and reviewed including: Chart review including previous notes, labs, imaging, consultation notes including prior neurology notes    Lab Tests: -I ordered, reviewed, and interpreted labs.   The pertinent results include:   Labs Reviewed  COMPREHENSIVE METABOLIC PANEL WITH GFR - Abnormal; Notable for the following components:      Result Value   GFR, Estimated 60 (*)    All other components within normal limits  CBC WITH DIFFERENTIAL/PLATELET  URINALYSIS, W/ REFLEX TO CULTURE (INFECTION SUSPECTED)     EKG   EKG Interpretation Date/Time:  Monday December 01 2023 21:26:07 EDT Ventricular Rate:  62 PR Interval:  201 QRS Duration:  125 QT Interval:  409 QTC Calculation: 416 R Axis:   -64  Text Interpretation: Sinus rhythm Atrial premature complexes Left bundle branch block Confirmed by Francesca Elsie ([id number]) on 12/01/2023 10:17:30 PM          Medicines ordered and prescription drug management: No orders of the defined types were placed in this encounter.   -I have reviewed the patients home medicines and have made adjustments as needed     Reevaluation: After the interventions noted above, I reevaluated the patient and found that their symptoms have improved  Co morbidities that complicate the patient evaluation  Past Medical History:  Diagnosis Date   Acute kidney failure (HCC)    5 years ago   ACUTE KIDNEY FAILURE UNSPECIFIED 07/12/2009   Allergy    Anemia    Anxiety    Arthritis    Basal cell carcinoma    Blood clot in vein    states it was not deep vein   CAD (coronary artery disease)    Mild nonobstructive by CT   Cataract    Cerebral aneurysm without rupture 2021   Chronic kidney disease    stones   Depression    around the time of her husband's death   Diverticulosis of colon (without mention of hemorrhage)    Fibromyalgia    GERD (gastroesophageal reflux disease)    Headache    Hiatal hernia    History of kidney stones     Hyperlipidemia    Hypertension    Interstitial cystitis    Kidney stone    Osteopenia    PONV (postoperative nausea and vomiting)    Scoliosis    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    Final Clinical Impression(s) / ED Diagnoses Final diagnoses:  Weakness     This chart was dictated using voice recognition software.  Despite best efforts to proofread,  errors can occur which can  change the documentation meaning.    Francesca Elsie CROME, MD 12/01/23 2337

## 2023-12-01 NOTE — Telephone Encounter (Signed)
 Pt.s wife calling to further discuss Pt.s Dementia diagnosis

## 2023-12-01 NOTE — ED Triage Notes (Signed)
 Pt's daughter states that she has been having difficulty walking and speaking like she normally does for 2 weeks. Pt has dementia.

## 2023-12-01 NOTE — ED Notes (Addendum)
 Patient transported to CT

## 2023-12-01 NOTE — Telephone Encounter (Signed)
 Joen was called she is going to take patient to ER for evaluation

## 2023-12-01 NOTE — ED Notes (Signed)
 Back from CT

## 2023-12-02 ENCOUNTER — Inpatient Hospital Stay (HOSPITAL_COMMUNITY)

## 2023-12-02 DIAGNOSIS — I6622 Occlusion and stenosis of left posterior cerebral artery: Secondary | ICD-10-CM | POA: Diagnosis present

## 2023-12-02 DIAGNOSIS — I672 Cerebral atherosclerosis: Secondary | ICD-10-CM | POA: Diagnosis not present

## 2023-12-02 DIAGNOSIS — F0393 Unspecified dementia, unspecified severity, with mood disturbance: Secondary | ICD-10-CM | POA: Diagnosis present

## 2023-12-02 DIAGNOSIS — Z85828 Personal history of other malignant neoplasm of skin: Secondary | ICD-10-CM | POA: Diagnosis not present

## 2023-12-02 DIAGNOSIS — K219 Gastro-esophageal reflux disease without esophagitis: Secondary | ICD-10-CM | POA: Diagnosis present

## 2023-12-02 DIAGNOSIS — M797 Fibromyalgia: Secondary | ICD-10-CM | POA: Diagnosis present

## 2023-12-02 DIAGNOSIS — E785 Hyperlipidemia, unspecified: Secondary | ICD-10-CM | POA: Diagnosis present

## 2023-12-02 DIAGNOSIS — R569 Unspecified convulsions: Secondary | ICD-10-CM

## 2023-12-02 DIAGNOSIS — Z96653 Presence of artificial knee joint, bilateral: Secondary | ICD-10-CM | POA: Diagnosis present

## 2023-12-02 DIAGNOSIS — I69391 Dysphagia following cerebral infarction: Secondary | ICD-10-CM | POA: Diagnosis not present

## 2023-12-02 DIAGNOSIS — Z743 Need for continuous supervision: Secondary | ICD-10-CM | POA: Diagnosis not present

## 2023-12-02 DIAGNOSIS — I611 Nontraumatic intracerebral hemorrhage in hemisphere, cortical: Secondary | ICD-10-CM | POA: Diagnosis not present

## 2023-12-02 DIAGNOSIS — Z8673 Personal history of transient ischemic attack (TIA), and cerebral infarction without residual deficits: Secondary | ICD-10-CM | POA: Diagnosis not present

## 2023-12-02 DIAGNOSIS — R1310 Dysphagia, unspecified: Secondary | ICD-10-CM | POA: Diagnosis present

## 2023-12-02 DIAGNOSIS — F02B0 Dementia in other diseases classified elsewhere, moderate, without behavioral disturbance, psychotic disturbance, mood disturbance, and anxiety: Secondary | ICD-10-CM | POA: Diagnosis not present

## 2023-12-02 DIAGNOSIS — I2510 Atherosclerotic heart disease of native coronary artery without angina pectoris: Secondary | ICD-10-CM | POA: Diagnosis present

## 2023-12-02 DIAGNOSIS — R410 Disorientation, unspecified: Secondary | ICD-10-CM | POA: Diagnosis present

## 2023-12-02 DIAGNOSIS — I671 Cerebral aneurysm, nonruptured: Secondary | ICD-10-CM | POA: Diagnosis present

## 2023-12-02 DIAGNOSIS — G309 Alzheimer's disease, unspecified: Secondary | ICD-10-CM | POA: Diagnosis not present

## 2023-12-02 DIAGNOSIS — R29702 NIHSS score 2: Secondary | ICD-10-CM | POA: Diagnosis present

## 2023-12-02 DIAGNOSIS — Z91041 Radiographic dye allergy status: Secondary | ICD-10-CM | POA: Diagnosis not present

## 2023-12-02 DIAGNOSIS — G936 Cerebral edema: Secondary | ICD-10-CM | POA: Diagnosis present

## 2023-12-02 DIAGNOSIS — G8194 Hemiplegia, unspecified affecting left nondominant side: Secondary | ICD-10-CM | POA: Diagnosis not present

## 2023-12-02 DIAGNOSIS — R29701 NIHSS score 1: Secondary | ICD-10-CM | POA: Diagnosis not present

## 2023-12-02 DIAGNOSIS — F0390 Unspecified dementia without behavioral disturbance: Secondary | ICD-10-CM | POA: Diagnosis not present

## 2023-12-02 DIAGNOSIS — F32A Depression, unspecified: Secondary | ICD-10-CM | POA: Diagnosis present

## 2023-12-02 DIAGNOSIS — R531 Weakness: Secondary | ICD-10-CM | POA: Diagnosis present

## 2023-12-02 DIAGNOSIS — I69198 Other sequelae of nontraumatic intracerebral hemorrhage: Secondary | ICD-10-CM | POA: Diagnosis not present

## 2023-12-02 DIAGNOSIS — Z886 Allergy status to analgesic agent status: Secondary | ICD-10-CM | POA: Diagnosis not present

## 2023-12-02 DIAGNOSIS — M79661 Pain in right lower leg: Secondary | ICD-10-CM | POA: Diagnosis not present

## 2023-12-02 DIAGNOSIS — I6389 Other cerebral infarction: Secondary | ICD-10-CM | POA: Diagnosis not present

## 2023-12-02 DIAGNOSIS — R0902 Hypoxemia: Secondary | ICD-10-CM | POA: Diagnosis not present

## 2023-12-02 DIAGNOSIS — I680 Cerebral amyloid angiopathy: Secondary | ICD-10-CM | POA: Diagnosis not present

## 2023-12-02 DIAGNOSIS — I6523 Occlusion and stenosis of bilateral carotid arteries: Secondary | ICD-10-CM | POA: Diagnosis not present

## 2023-12-02 DIAGNOSIS — I639 Cerebral infarction, unspecified: Secondary | ICD-10-CM | POA: Diagnosis present

## 2023-12-02 DIAGNOSIS — E859 Amyloidosis, unspecified: Secondary | ICD-10-CM | POA: Diagnosis not present

## 2023-12-02 DIAGNOSIS — R29706 NIHSS score 6: Secondary | ICD-10-CM | POA: Diagnosis not present

## 2023-12-02 DIAGNOSIS — I619 Nontraumatic intracerebral hemorrhage, unspecified: Secondary | ICD-10-CM

## 2023-12-02 DIAGNOSIS — R221 Localized swelling, mass and lump, neck: Secondary | ICD-10-CM | POA: Diagnosis not present

## 2023-12-02 DIAGNOSIS — I610 Nontraumatic intracerebral hemorrhage in hemisphere, subcortical: Secondary | ICD-10-CM | POA: Diagnosis present

## 2023-12-02 DIAGNOSIS — I739 Peripheral vascular disease, unspecified: Secondary | ICD-10-CM | POA: Diagnosis not present

## 2023-12-02 DIAGNOSIS — Z933 Colostomy status: Secondary | ICD-10-CM | POA: Diagnosis not present

## 2023-12-02 DIAGNOSIS — I10 Essential (primary) hypertension: Secondary | ICD-10-CM

## 2023-12-02 DIAGNOSIS — Z79899 Other long term (current) drug therapy: Secondary | ICD-10-CM | POA: Diagnosis not present

## 2023-12-02 DIAGNOSIS — R4701 Aphasia: Secondary | ICD-10-CM | POA: Diagnosis present

## 2023-12-02 DIAGNOSIS — I629 Nontraumatic intracranial hemorrhage, unspecified: Secondary | ICD-10-CM | POA: Diagnosis not present

## 2023-12-02 DIAGNOSIS — Z8249 Family history of ischemic heart disease and other diseases of the circulatory system: Secondary | ICD-10-CM | POA: Diagnosis not present

## 2023-12-02 DIAGNOSIS — K5901 Slow transit constipation: Secondary | ICD-10-CM | POA: Diagnosis not present

## 2023-12-02 LAB — COMPREHENSIVE METABOLIC PANEL WITH GFR
ALT: 12 U/L (ref 0–44)
AST: 19 U/L (ref 15–41)
Albumin: 3.1 g/dL — ABNORMAL LOW (ref 3.5–5.0)
Alkaline Phosphatase: 44 U/L (ref 38–126)
Anion gap: 9 (ref 5–15)
BUN: 12 mg/dL (ref 8–23)
CO2: 19 mmol/L — ABNORMAL LOW (ref 22–32)
Calcium: 8.9 mg/dL (ref 8.9–10.3)
Chloride: 109 mmol/L (ref 98–111)
Creatinine, Ser: 1.03 mg/dL — ABNORMAL HIGH (ref 0.44–1.00)
GFR, Estimated: 54 mL/min — ABNORMAL LOW (ref >60)
Glucose, Bld: 78 mg/dL (ref 70–99)
Potassium: 4 mmol/L (ref 3.5–5.1)
Sodium: 137 mmol/L (ref 135–145)
Total Bilirubin: 0.8 mg/dL (ref 0.0–1.2)
Total Protein: 5.8 g/dL — ABNORMAL LOW (ref 6.5–8.1)

## 2023-12-02 LAB — ECHOCARDIOGRAM COMPLETE
AR max vel: 2.17 cm2
AV Area VTI: 2.19 cm2
AV Area mean vel: 2.21 cm2
AV Mean grad: 4 mmHg
AV Peak grad: 8 mmHg
Ao pk vel: 1.41 m/s
Area-P 1/2: 4.83 cm2
Height: 63 in
S' Lateral: 2.1 cm
Weight: 1887.14 [oz_av]

## 2023-12-02 LAB — MRSA NEXT GEN BY PCR, NASAL: MRSA by PCR Next Gen: NOT DETECTED

## 2023-12-02 LAB — GLUCOSE, CAPILLARY
Glucose-Capillary: 152 mg/dL — ABNORMAL HIGH (ref 70–99)
Glucose-Capillary: 66 mg/dL — ABNORMAL LOW (ref 70–99)
Glucose-Capillary: 131 mg/dL — ABNORMAL HIGH (ref 70–99)
Glucose-Capillary: 75 mg/dL (ref 70–99)
Glucose-Capillary: 82 mg/dL (ref 70–99)
Glucose-Capillary: 168 mg/dL — ABNORMAL HIGH (ref 70–99)
Glucose-Capillary: 130 mg/dL — ABNORMAL HIGH (ref 70–99)
Glucose-Capillary: 108 mg/dL — ABNORMAL HIGH (ref 70–99)

## 2023-12-02 LAB — CBC
HCT: 41.3 % (ref 36.0–46.0)
Hemoglobin: 13.6 g/dL (ref 12.0–15.0)
MCH: 29.9 pg (ref 26.0–34.0)
MCHC: 32.9 g/dL (ref 30.0–36.0)
MCV: 90.8 fL (ref 80.0–100.0)
Platelets: 162 K/uL (ref 150–400)
RBC: 4.55 MIL/uL (ref 3.87–5.11)
RDW: 13.4 % (ref 11.5–15.5)
WBC: 4.1 K/uL (ref 4.0–10.5)
nRBC: 0 % (ref 0.0–0.2)

## 2023-12-02 LAB — LIPID PANEL
Cholesterol: 182 mg/dL (ref 0–200)
HDL: 60 mg/dL (ref >40)
LDL Cholesterol: 108 mg/dL — ABNORMAL HIGH (ref 0–99)
Total CHOL/HDL Ratio: 3 ratio
Triglycerides: 71 mg/dL (ref <150)
VLDL: 14 mg/dL (ref 0–40)

## 2023-12-02 MED ORDER — DEXTROSE 50 % IV SOLN
INTRAVENOUS | Status: AC
  Administered 2023-12-02 (×2): 50 mL
  Filled 2023-12-02: qty 50

## 2023-12-02 MED ORDER — STROKE: EARLY STAGES OF RECOVERY BOOK
Freq: Once | Status: AC
  Filled 2023-12-02: qty 1

## 2023-12-02 MED ORDER — LABETALOL HCL 5 MG/ML IV SOLN
10.0000 mg | INTRAVENOUS | Status: DC | PRN
Start: 2023-12-02 — End: 2023-12-05
  Administered 2023-12-03 (×2): 10 mg via INTRAVENOUS
  Filled 2023-12-02: qty 4

## 2023-12-02 MED ORDER — CLEVIDIPINE BUTYRATE 0.5 MG/ML IV EMUL
0.0000 mg/h | INTRAVENOUS | Status: DC
  Administered 2023-12-02 (×2): 2 mg/h via INTRAVENOUS
  Administered 2023-12-02 (×2): 0.5 mg/h via INTRAVENOUS
  Filled 2023-12-02: qty 50
  Filled 2023-12-02: qty 100

## 2023-12-02 MED ORDER — ACETAMINOPHEN 650 MG RE SUPP: 650.0000 mg | RECTAL | Status: DC | PRN

## 2023-12-02 MED ORDER — ORAL CARE MOUTH RINSE: 15.0000 mL | OROMUCOSAL | Status: DC | PRN

## 2023-12-02 MED ORDER — DIPHENHYDRAMINE HCL 50 MG/ML IJ SOLN
25.0000 mg | Freq: Once | INTRAMUSCULAR | Status: AC
  Administered 2023-12-02 (×2): 25 mg via INTRAVENOUS
  Filled 2023-12-02: qty 1

## 2023-12-02 MED ORDER — ACETAMINOPHEN 160 MG/5ML PO SOLN: 650.0000 mg | ORAL | Status: DC | PRN

## 2023-12-02 MED ORDER — IOHEXOL 350 MG/ML SOLN
75.0000 mL | Freq: Once | INTRAVENOUS | Status: AC | PRN
  Administered 2023-12-02 (×2): 75 mL via INTRAVENOUS

## 2023-12-02 MED ORDER — METOPROLOL SUCCINATE ER 25 MG PO TB24
25.0000 mg | ORAL_TABLET | Freq: Every day | ORAL | Status: DC
  Administered 2023-12-02 – 2023-12-04 (×5): 25 mg via ORAL
  Filled 2023-12-02 (×4): qty 1

## 2023-12-02 MED ORDER — ACETAMINOPHEN 325 MG PO TABS: 650.0000 mg | ORAL_TABLET | ORAL | Status: DC | PRN

## 2023-12-02 MED ORDER — CHLORHEXIDINE GLUCONATE CLOTH 2 % EX PADS
6.0000 | MEDICATED_PAD | CUTANEOUS | Status: DC
  Administered 2023-12-02 – 2023-12-04 (×5): 6 via TOPICAL

## 2023-12-02 MED ORDER — PANTOPRAZOLE SODIUM 40 MG IV SOLR
40.0000 mg | Freq: Every day | INTRAVENOUS | Status: DC
  Administered 2023-12-02 (×4): 40 mg via INTRAVENOUS
  Filled 2023-12-02 (×2): qty 10

## 2023-12-02 MED ORDER — SENNOSIDES-DOCUSATE SODIUM 8.6-50 MG PO TABS
1.0000 | ORAL_TABLET | Freq: Two times a day (BID) | ORAL | Status: DC
  Administered 2023-12-02 – 2023-12-05 (×13): 1 via ORAL
  Filled 2023-12-02 (×8): qty 1

## 2023-12-02 MED ORDER — SODIUM CHLORIDE 0.9 % IV BOLUS
250.0000 mL | Freq: Once | INTRAVENOUS | Status: AC
  Administered 2023-12-02 (×2): 250 mL via INTRAVENOUS

## 2023-12-02 MED ORDER — AMLODIPINE BESYLATE 5 MG PO TABS
5.0000 mg | ORAL_TABLET | Freq: Every day | ORAL | Status: DC
  Administered 2023-12-02 – 2023-12-04 (×5): 5 mg via ORAL
  Filled 2023-12-02 (×3): qty 1

## 2023-12-02 MED ORDER — HYDRALAZINE HCL 20 MG/ML IJ SOLN
10.0000 mg | Freq: Four times a day (QID) | INTRAMUSCULAR | Status: DC | PRN
Start: 2023-12-02 — End: 2023-12-05

## 2023-12-02 NOTE — Progress Notes (Signed)
  Inpatient Rehab Admissions Coordinator :  Per therapy recommendations, patient was screened for CIR candidacy by Ottie Glazier RN MSN.  At this time patient appears to be a potential candidate for CIR. I will place a rehab consult per protocol for full assessment. Please call me with any questions.  Ottie Glazier RN MSN Admissions Coordinator [phone]

## 2023-12-02 NOTE — ED Notes (Signed)
 [facility] called for transport.

## 2023-12-02 NOTE — Evaluation (Signed)
 Physical Therapy Evaluation Patient Details Name: [name] [name] MRN: [id number] DOB: 1941-02-08 Today's Date: 12/02/2023  History of Present Illness  Patient is an 83 y/o female admitted 12/02/23 with one and half weeks of confusion and difficulty ambulating.  Daughter brought her to the ED and pt found to have R frontal hemorrhage.  PMH positive for CAD, CKD, GERD, arthritis, HTN, fibromyalgia, interstitial cystitis.  Clinical Impression  Patient presents with decreased mobility due to generalized weakness, decreased balance, decreased activity tolerance, apraxia and decreased cognition.  Daughter reports pt with progressing decline at home though was still living alone.  Currently mod to max A for bed mobility, mod A for step pivot to [facility] and recliner with RW.  Daughter states she and her brother can provide supervision initially at d/c.  Feel patient will benefit from continued skilled PT in the acute setting and from post-acute inpatient rehab (>3 hours/day) at d/c.       If plan is discharge home, recommend the following: A lot of help with bathing/dressing/bathroom;A lot of help with walking and/or transfers;Assistance with cooking/housework;Supervision due to cognitive status   Can travel by private vehicle        Equipment Recommendations Other (comment) (TBA)  Recommendations for Other Services  Rehab consult    Functional Status Assessment Patient has had a recent decline in their functional status and demonstrates the ability to make significant improvements in function in a reasonable and predictable amount of time.     Precautions / Restrictions Precautions Precautions: Fall Recall of Precautions/Restrictions: Impaired Precaution/Restrictions Comments: SBP<160      Mobility  Bed Mobility Overal bed mobility: Needs Assistance Bed Mobility: Supine to Sit     Supine to sit: Mod assist, Max assist     General bed mobility comments: increased time, step by step  instructions needed and A for initiation for moving legs off EOB, lifting trunk and scooting hips    Transfers Overall transfer level: Needs assistance Equipment used: Rolling walker (2 wheels) Transfers: Sit to/from Stand, Bed to chair/wheelchair/BSC Sit to Stand: Min assist   Step pivot transfers: Mod assist       General transfer comment: using wide walker that was in her room, A for balance with sit to stand and mod cues for sequencing, assist for stepping to recliner, pt incontinent of stool then A for stepping to [facility] and back to recliner    Ambulation/Gait                  Stairs            Wheelchair Mobility     Tilt Bed    Modified Rankin (Stroke Patients Only)       Balance Overall balance assessment: Needs assistance   Sitting balance-Leahy Scale: Fair     Standing balance support: Bilateral upper extremity supported Standing balance-Leahy Scale: Poor Standing balance comment: some posterior bias                             Pertinent Vitals/Pain Pain Assessment Pain Assessment: No/denies pain    Home Living Family/patient expects to be discharged to:: Private residence Living Arrangements: Alone Available Help at Discharge: Family Type of Home: House Home Access: Stairs to enter   Secretary/administrator of Steps: 1   Home Layout: One level Home Equipment: Agricultural consultant (2 wheels);Cane - single point Additional Comments: daughter recently got her RW due to imbalance  Prior Function Prior Level of Function : Independent/Modified Independent             Mobility Comments: daughter checked on her frequently; though usually completed ADL's and some IADL's independent       Extremity/Trunk Assessment   Upper Extremity Assessment Upper Extremity Assessment: Generalized weakness    Lower Extremity Assessment Lower Extremity Assessment: Generalized weakness       Communication    Communication Communication: No apparent difficulties    Cognition Arousal: Alert Behavior During Therapy: WFL for tasks assessed/performed   PT - Cognitive impairments: Attention, Problem solving, Sequencing, Safety/Judgement                       PT - Cognition Comments: sustained attention, slow processing, some apraxia noted. Following commands: Impaired Following commands impaired: Follows one step commands inconsistently, Follows one step commands with increased time     Cueing Cueing Techniques: Verbal cues, Tactile cues, Gestural cues     General Comments General comments (skin integrity, edema, etc.): daughter in the room and supportive, BP within parameters though barely (155/94 on EOB), RN Aware    Exercises     Assessment/Plan    PT Assessment Patient needs continued PT services  PT Problem List Decreased strength;Decreased cognition;Decreased activity tolerance;Decreased balance;Decreased mobility;Decreased safety awareness;Decreased knowledge of use of DME       PT Treatment Interventions DME instruction;Gait training;Patient/family education;Cognitive remediation;Neuromuscular re-education;Therapeutic activities;Functional mobility training;Therapeutic exercise;Balance training    PT Goals (Current goals can be found in the Care Plan section)  Acute Rehab PT Goals Patient Stated Goal: return home with intermittent assist eventually PT Goal Formulation: With patient/family Time For Goal Achievement: 12/16/23 Potential to Achieve Goals: Fair    Frequency Min 3X/week     Co-evaluation               AM-PAC PT 6 Clicks Mobility  Outcome Measure Help needed turning from your back to your side while in a flat bed without using bedrails?: A Lot Help needed moving from lying on your back to sitting on the side of a flat bed without using bedrails?: Total Help needed moving to and from a bed to a chair (including a wheelchair)?: A Lot Help  needed standing up from a chair using your arms (e.g., wheelchair or bedside chair)?: A Lot Help needed to walk in hospital room?: Total Help needed climbing 3-5 steps with a railing? : Total 6 Click Score: 9    End of Session Equipment Utilized During Treatment: Gait belt;Oxygen Activity Tolerance: Patient limited by fatigue Patient left: in chair;with call bell/phone within reach;with chair alarm set;with family/visitor present;with nursing/sitter in room Nurse Communication: Mobility status PT Visit Diagnosis: Other abnormalities of gait and mobility (R26.89);Other symptoms and signs involving the nervous system (R29.898);Apraxia (R48.2)    Time: 1220-1250 PT Time Calculation (min) (ACUTE ONLY): 30 min   Charges:   PT Evaluation $PT Eval Moderate Complexity: 1 Mod PT Treatments $Therapeutic Activity: 8-22 mins PT General Charges $$ ACUTE PT VISIT: 1 Visit         Micheline Portal, PT Acute Rehabilitation Services Office:[phone] 12/02/2023   Montie Portal 12/02/2023, 5:36 PM

## 2023-12-02 NOTE — TOC CM/SW Note (Signed)
 Transition of Care [facility]) - Inpatient Brief Assessment   Patient Details  Name: [name] [name] MRN: [id number] Date of Birth: 1941/01/18  [facility] CM/SW Contact:    Lauraine FORBES Saa, LCSWA Phone Number: 12/02/2023, 9:06 AM   Clinical Narrative:  9:07 AM Per chart review, patient resides at home independently where family checks in on her. Patient has a PCP and insurance. Patient has SNF history with [facility]. Patient has HH history with [facility] and DME (rolling walker) history. Patient's preferred pharmacy is [facility] [phone] [facility]. No TOC needs were identified at this time. TOC will continue to follow and be available to assist.  Transition of Care Asessment: Insurance and Status: Insurance coverage has been reviewed Patient has primary care physician: Yes Home environment has been reviewed: Private Residence Prior level of function:: N/A Prior/Current Home Services: No current home services (Has HH/DME history) Social Drivers of Health Review: SDOH reviewed no interventions necessary Readmission risk has been reviewed: Yes (Currently Green 9%) Transition of care needs: no transition of care needs at this time

## 2023-12-02 NOTE — Procedures (Signed)
 Patient Name: [name] [name]  MRN: [id number]  Epilepsy Attending: Arlin MALVA Krebs  Referring Physician/Provider: Khaliqdina, Salman, MD  Date: 12/02/2023 Duration: 22.40 seconds  Patient history: 83 y.o. female with hx of dementia, GERD, hypertension, CAD, fibromyalgia, basilar tip aneurysm s/p embolization in 2021 who is brought in with about a week and a half history of confusion and difficulty with ambulation. EEG to evaluate for seizure.  Level of alertness: Awake  AEDs during EEG study: None  Technical aspects: This EEG study was done with scalp electrodes positioned according to the 10-20 International system of electrode placement. Electrical activity was reviewed with band pass filter of 1-70Hz , sensitivity of 7 uV/mm, display speed of 13mm/sec with a 60Hz  notched filter applied as appropriate. EEG data were recorded continuously and digitally stored.  Video monitoring was available and reviewed as appropriate.  Description: The posterior dominant rhythm consists of 8 Hz activity of moderate voltage (25-35 uV) seen predominantly in posterior head regions, symmetric and reactive to eye opening and eye closing. Hyperventilation and photic stimulation were not performed.     IMPRESSION: This study is within normal limits. No seizures or epileptiform discharges were seen throughout the recording.  A normal interictal EEG does not exclude the diagnosis of epilepsy.   [name] [name]

## 2023-12-02 NOTE — Progress Notes (Addendum)
 STROKE TEAM PROGRESS NOTE    SUBJECTIVE (INTERIM HISTORY) Daughter present at bedside.  Patient has dementia at baseline, and most of the history is provided by her daughter.  The daughter reports that the patient lives independently, with family checking in daily.  Over the past few days, she noticed her mother was less talkative and had been stumbling when walking, and was not completing her usual laundry.  Due to these changes, she brought her mother to the ED for further evaluation where CT imaging revealed a multifocal right frontal hemorrhage with cytotoxic edema.  On examination today, the only abnormal finding was weakness in left lower extremity.    OBJECTIVE  CBC    Component Value Date/Time   WBC 4.1 12/02/2023 1023   RBC 4.55 12/02/2023 1023   HGB 13.6 12/02/2023 1023   HCT 41.3 12/02/2023 1023   PLT 162 12/02/2023 1023   MCV 90.8 12/02/2023 1023   MCH 29.9 12/02/2023 1023   MCHC 32.9 12/02/2023 1023   RDW 13.4 12/02/2023 1023   LYMPHSABS 1.1 12/01/2023 1741   MONOABS 0.3 12/01/2023 1741   EOSABS 0.1 12/01/2023 1741   BASOSABS 0.0 12/01/2023 1741    BMET    Component Value Date/Time   NA 137 12/02/2023 1023   NA 138 05/01/2018 0000   K 4.0 12/02/2023 1023   CL 109 12/02/2023 1023   CO2 19 (L) 12/02/2023 1023   GLUCOSE 78 12/02/2023 1023   BUN 12 12/02/2023 1023   BUN 28 (A) 05/01/2018 0000   CREATININE 1.03 (H) 12/02/2023 1023   CREATININE 0.92 11/02/2015 1538   CALCIUM  8.9 12/02/2023 1023   CALCIUM  9.6 08/10/2012 0945   GFRNONAA 54 (L) 12/02/2023 1023   GFRNONAA 62 11/02/2015 1538    IMAGING past 24 hours CT ANGIO HEAD NECK W WO CM Result Date: 12/02/2023 EXAM: CTA Head and Neck with Intravenous Contrast. CT Head without Contrast. CLINICAL HISTORY: Neuro deficit, acute, stroke suspected; Stroke, hemorrhagic; follow up non contrast CT Head and a CT Angio of head and neck. TECHNIQUE: Axial CTA images of the head and neck performed with intravenous contrast.  MIP reconstructed images were created and reviewed. Axial computed tomography images of the head/brain performed without intravenous contrast. Note: Per PQRS, the description of internal carotid artery percent stenosis, including 0 percent or normal exam, is based on [place] American Symptomatic Carotid Endarterectomy Trial (NASCET) criteria. Dose reduction technique was used including one or more of the following: automated exposure control, adjustment of mA and kV according to patient size, and/or iterative reconstruction. CONTRAST: 75 mL of iohexol  (OMNIPAQUE ) 350 MG/ML injection. COMPARISON: MRA of the head dated 12/02/2023 and a conventional cerebral arteriogram dated 12/01/2020. FINDINGS: CT HEAD: BRAIN: No acute intraparenchymal hemorrhage. No mass lesion. No CT evidence for acute territorial infarct. No midline shift or extra-axial collection. VENTRICLES: No hydrocephalus. ORBITS: The orbits are unremarkable. SINUSES AND MASTOIDS: The paranasal sinuses and mastoid air cells are clear. CTA NECK: COMMON CAROTID ARTERIES: No significant stenosis. No dissection or occlusion. Moderate calcific plaque present within the aortic arch which appears normal in caliber. There is 3-vessel origin of the great arteries. INTERNAL CAROTID ARTERIES: No stenosis by NASCET criteria. No dissection or occlusion. The right internal carotid artery is tortuous. The left internal carotid artery is also mildly tortuous. Mild-to-moderate calcific plaque within the right and left carotid bulbs, but no associated stenosis. VERTEBRAL ARTERIES: No significant stenosis. No dissection or occlusion. The vertebral arteries are codominant and normal in caliber throughout their  respective courses. CTA HEAD: ANTERIOR CEREBRAL ARTERIES: No significant stenosis. No occlusion. 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 MIDDLE CEREBRAL ARTERIES: No significant stenosis. No occlusion. No  aneurysm. Mild atheromatous irregularity of the M1 segments, but no flow-limiting stenosis. POSTERIOR CEREBRAL ARTERIES: No significant stenosis. No occlusion. No aneurysm. The right posterior cerebral artery is normal in caliber and unremarkable. Moderate calcific atheromatous disease at the junction of the left P1 and P2 segments, causing moderate-to-severe focal stenosis, as demonstrated on the previous MRI. BASILAR ARTERY: No significant stenosis. No occlusion. Status post coiling of the basilar tip. There is calcific atheromatous disease within the distal basilar artery, causing mild-to-moderate stenosis, less than 50%. No apparent residual or recurrent aneurysm. OTHER: There is moderate calcific plaque within the carotid siphons, with no flow-limiting stenosis. Moderate spray artifact from embolization coils within the tip of the basilar artery largely obscuring the anterior communicating artery. SOFT TISSUES: There appears to be a solid nodule within the right lobe of the thyroid  measuring approximately 11 x 8 mm. According to the American Thyroid  Association guidelines, the finding is consistent with an incidental thyroid  nodule >1 cm and the recommendation is dedicated thyroid  ultrasound and serum TSH measurement. BONES: There is degenerative endolisthesis present at C3-4. There is also reversal of the normal cervical lordosis and diffuse multilevel chronic degenerative disc disease. LIMITATIONS/ARTIFACTS: There is free artifact obscuring and distorting the lower neck. IMPRESSION: 1. No acute intracranial hemorrhage or ischemic change. 2. Status post coiling of the basilar tip with no apparent residual or recurrent aneurysm. 3. 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 4. Moderate-to-severe focal stenosis at the junction of the left P1 and P2 segments, as demonstrated on the previous MRI. 5. Incidental solid nodule within the right lobe  of the thyroid  measuring approximately 11 x 8 mm. Recommend dedicated thyroid  ultrasound and serum TSH measurement. Electronically signed by: evalene coho 12/02/2023 10:10 AM EDT RP Workstation: [id number]   MR ANGIO HEAD WO CONTRAST Result Date: 12/02/2023 CLINICAL DATA:  83 year old female with superior right frontal lobe hemorrhage on Head CT yesterday. Neurologic deficit. 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 EXAM: MRA HEAD WITHOUT CONTRAST TECHNIQUE: Angiographic images of the Circle of Willis were acquired using MRA technique without intravenous contrast. COMPARISON:  Brain MRI today reported separately. No prior CT or MR angiogram. FINDINGS: Anterior circulation: Antegrade flow signal in both ICA siphons. Ophthalmic and right posterior communicating artery origins appear normal. Left posterior communicating artery diminutive or absent. Mild ICA siphon irregularity without stenosis. Patent carotid termini, MCA and ACA origins. Mildly ectatic anterior communicating artery and difficult to exclude a small 2 mm Acomm aneurysm as seen on series 4, image 63. This could alternatively be infundibulum. Tortuous proximal A2 segments. Visible ACA branches otherwise within normal limits. Left MCA M1 bifurcates early without stenosis. Right MCA M1 and bifurcation appear patent without stenosis. Visible MCA branches are within normal limits. Posterior circulation: Antegrade flow in the posterior circulation. Codominant distal vertebral arteries and vertebrobasilar junction appear patent without stenosis. Patent PICA origins. Patent basilar artery without stenosis. Artifact at the basilar tip from embolization coil pack. Small area of 2 mm flow signal along the inferior surface of the coil pack, series 4, image 64 and series 455, image 9. Adjacent PCA origins remain patent. Right posterior communicating artery is visible. SCA origins are patent. Right PCA branches are within  normal limits. There is a short segment moderate to severe stenosis of the left PCA P1/P2 junction (series 454, image 6). With maintained distal left PCA branch flow. Anatomic variants: None significant. Other: Brain MRI today is reported separately. IMPRESSION: 1. 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 2. Difficult to exclude a small 2 mm A-comm aneurysm, but might an infundibulum. 3. No large vessel occlusion. 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 4. Abnormal Brain MRI today reported separately. Electronically Signed   By: VEAR Hurst M.D.   On: 12/02/2023 09:23   MR BRAIN WO CONTRAST Result Date: 12/02/2023 CLINICAL DATA:  83 year old female with superior right frontal lobe hemorrhage on Head CT yesterday. Neurologic deficit. 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 EXAM: MRI HEAD WITHOUT CONTRAST TECHNIQUE: Multiplanar, multiecho pulse sequences of the brain and surrounding structures were obtained without intravenous contrast. COMPARISON:  Head CT yesterday.  Brain MRI 11/27/2022. FINDINGS: Brain: Significant streak artifact through the anterior cranial fossa on CT yesterday from large basilar tip aneurysm coil pack. MRI reveals similar appearing right inferior frontal gyrus and superior frontal gyrus intra-axial hemorrhage, with T1 and T2 signal heterogeneity (series 9, image 45 in the right inferior frontal gyrus. That hematoma is 17 x 26 by 21 mm (AP by transverse by CC, volume estimated at 5 mL) where as the left superior frontal gyrus hematoma visible by CT is 38 x 22 x 31 mm (AP by transverse by CC, volume estimated at 13 mL) and extends to the superior surface of the brain. Comparing across modalities, neither hemorrhage appears significantly changed from the CT yesterday. Mild regional T2 and FLAIR hyperintense edema at both sites. No extra-axial or  intraventricular blood is identified. DWI susceptibility at both sites with no other restricted diffusion suggestive of acute ischemia. No significant midline shift or intracranial mass effect, basilar cisterns remain normal. Scattered chronic microhemorrhages in the left parietal and occipital lobes also appear increased from the SWI last year. Curvilinear chronic hemosiderin in the inferior left cerebellum is stable. Elsewhere confluent bilateral cerebral white matter T2 and FLAIR hyperintensity has not significantly changed. Deep gray nuclei T2 heterogeneity appears stable. Negative pituitary and cervicomedullary junction. Vascular: Major intracranial vascular flow voids are preserved. Heterogeneity at the basilar artery tip appears stable from the MRI last year. Skull and upper cervical spine: Stable. Background bone marrow signal is within normal limits. Sinuses/Orbits: Stable, negative. Other: Mastoids are clear. IMPRESSION: 1. MRI reveals two acute/subacute intra-axial hemorrhages in the right hemisphere: Known superior frontal gyrus hematoma (13 mL stable since CT yesterday), and a smaller right inferior frontal gyrus hematoma (5 mL) which was obscured on CT by basilar tip coil pack artifact. Underlying micro-hemorrhages in the left parietal and occipital lobes appear increased from MRI last year. 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 2. Mild associated cerebral edema with no significant intracranial mass effect or other complicating features. 3. No other acute intracranial abnormality. Previously treated basilar tip aneurysm. Advanced chronic white matter disease. Electronically Signed   By: VEAR Hurst M.D.   On: 12/02/2023 09:16   CT Head Wo Contrast Addendum Date: 12/01/2023 ADDENDUM REPORT: 12/01/2023 23:57 ADDENDUM: These results were called by telephone at the time of interpretation on 12/01/2023 at 11:56 pm to provider April Palumbo, MD, who verbally  acknowledged these results. Electronically Signed  By: Dorethia Molt M.D.   On: 12/01/2023 23:57   Result Date: 12/01/2023 CLINICAL DATA:  Mental status change, unknown cause, dementia EXAM: CT HEAD WITHOUT CONTRAST TECHNIQUE: Contiguous axial images were obtained from the base of the skull through the vertex without intravenous contrast. RADIATION DOSE REDUCTION: This exam was performed according to the departmental dose-optimization program which includes automated exposure control, adjustment of the mA and/or kV according to patient size and/or use of iterative reconstruction technique. COMPARISON:  MRI 11/27/2022 FINDINGS: Brain: Acute intraparenchymal hematoma is seen involving the superior frontal gyrus of the right frontal lobe measuring 3.1 x 3.6 x 1.5 cm in greatest dimension (23/6, 22/2). Small amount of surrounding cytotoxic. Mild mass effect with effacement of the overlying sulci. No associated subdural or subarachnoid hemorrhage. No intraventricular hemorrhage. No midline shift. Significant streak artifact related to embolization coils in the region of the basilar tip. Mild parenchymal volume loss is commensurate with the patient's age. Extensive periventricular white matter changes are identified, stable since prior examination, possibly related to small vessel ischemia. Ventricular size is normal. Cerebellum is unremarkable. Vascular: No hyperdense vessel or unexpected calcification. Embolization coils noted within the basilar tip. Skull: Normal. Negative for fracture or focal lesion. Sinuses/Orbits: No acute finding. Other: Mastoid air cells and middle ear cavities are clear. IMPRESSION: 1. Acute intraparenchymal hematoma involving the superior frontal gyrus of the right frontal lobe measuring 3.1 x 3.6 x 1.5 cm in greatest dimension. Mild mass effect with effacement of the overlying sulci. No midline shift. 2. Extensive periventricular white matter changes, stable since prior examination,  possibly related to small vessel ischemia. 3. Embolization coils in the region of the basilar tip. Electronically Signed: By: Dorethia Molt M.D. On: 12/01/2023 23:50    Vitals:   12/02/23 0800 12/02/23 1000 12/02/23 1146 12/02/23 1200  BP: 135/66 135/71  (!) 168/77  Pulse: 74 68  66  Resp: 17 17  19   Temp:   98.3 F (36.8 C)   TempSrc:   Oral   SpO2: 100% 98%  100%  Weight:      Height:         PHYSICAL EXAM General:  Alert, well-nourished, well-developed patient in no acute distress CV: Regular rate and rhythm on monitor Respiratory:  Regular, unlabored respirations on room air   NEURO:  Mental Status: AA&Ox3, patient is able to give clear and coherent history Speech/Language: speech is without dysarthria or aphasia.  Naming, repetition, fluency, and comprehension intact.  Cranial Nerves:  II: PERRL. Visual fields full.  III, IV, VI: EOMI. Eyelids elevate symmetrically.  V: Sensation is intact to light touch and symmetrical to face.  VII: Face is symmetrical resting and smiling VIII: hearing intact to voice. IX, X: Palate elevates symmetrically. Phonation is normal.  KP:Dynloizm shrug 5/5. XII: tongue is midline without fasciculations. Motor: LLE weakness 3/5, all other extremities 5/5 Tone: is normal and bulk is normal Sensation- Intact to light touch bilaterally. Extinction absent to light touch to DSS.   Coordination: FTN intact bilaterally, HKS: no ataxia in BLE.No drift.  Gait- deferred  Most Recent NIH  Dizziness Present: No (08/12 1200) Headache Present: No (08/12 1200) Interval: Other (Comment) (08/12 1200) Level of Consciousness (1a.)   : Alert, keenly responsive (08/12 1200) LOC Questions (1b. )   : Answers one question correctly (08/12 1200) LOC Commands (1c. )   : Performs both tasks correctly (08/12 1200) Best Gaze (2. )  : Normal (08/12 1200) Visual (3. )  :  No visual loss (08/12 1200) Facial Palsy (4. )    : Normal symmetrical movements (08/12  1200) Motor Arm, Left (5a. )   : No drift (08/12 1200) Motor Arm, Right (5b. ) : No drift (08/12 1200) Motor Leg, Left (6a. )  : No drift (08/12 1200) Motor Leg, Right (6b. ) : No drift (08/12 1200) Limb Ataxia (7. ): Absent (08/12 1200) Sensory (8. )  : Normal, no sensory loss (08/12 1200) Best Language (9. )  : Mild-to-moderate aphasia (08/12 1200) Dysarthria (10. ): Normal (08/12 1200) Extinction/Inattention (11.)   : No Abnormality (08/12 1200) Complete NIHSS TOTAL: 2 (08/12 1200)    ASSESSMENT/PLAN Ms. SUNSHYNE HORVATH is a 83 y.o. female with history of dementia, GERD, hypertension, CAD, fibromyalgia, basilar tip aneurysm s/p plantation in 2021 who was admitted for a week and a half history of confusion and difficulty with ambulation.  CT head revealed a right frontal hemorrhage.  Neurological exam was notable only for weakness isolated to the left lower extremity.  Differential considerations include small vessel disease versus cerebral amyloid angiopathy.  Intraparenchymal Hemorrhage:  right frontal lobe, superior frontal gyrus Etiology: Hypertensive versus amyloid angiopathy Code Stroke CT head acute intraparenchymal hematoma involving the superior frontal gyrus of the right frontal lobe measuring 3.1 x 3.6 x 1.5 cm.  Mild mass effect with effacement of the overlying sulci.  No midline shift.  Extensive periventricular white matter changes, stable since prior examination.  Embolization coils in the region of the basilar tip. CTA head & neck no acute intracranial hemorrhage or ischemic change.  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  Moderate to severe focal stenosis at the junction of the left P1 and P2 segment, associated on the previous MRI.  Incidental nodule within the right lobe of thyroid  11 x 8 mm. MRI 2 acute/subacute intra-axial hemorrhages in the right hemisphere-known superior frontal gyrus hematoma, 13 mm stable since CT yesterday, and a smaller right inferior  frontal gyrus hematoma, 5 mm, which is obscured on CT but basilar tip coil pack artifact.  Underlying microhemorrhages in the left parietal frontal lobes appear increased from MRI last year. MRA treated basilar tip aneurysm with a small 2 mm area of possible pleural compaction along the base of the aneurysm.  No LVO.  But positive for moderate to severe stenosis of the left PCA P1/P2 junction. 2D Echo pending LDL 108 HgbA1c 5.5 VTE prophylaxis - SCDs No antithrombotic prior to admission, now on No antithrombotic  Therapy recommendations:  Pending Disposition: pending  Hx of Stroke/TIA 2021: Cerebral aneurysm without rupture s/p plantation   Hypertension Home meds: Amlodipine  5 mg, Toprol -XL 25 mg Stop Cleviprex , restarted home medications Unstable Blood Pressure Goal: SBP less than 160   Hyperlipidemia No home cholesterol medications LDL 108, goal < 70   Dysphagia Patient has post-stroke dysphagia, SLP consulted    Diet   Diet regular Room service appropriate? Yes; Fluid consistency: Thin   Advance diet as tolerated  Other Stroke Risk Factors Coronary artery disease  Other Active Problems Fibromyalgia GERD  Hospital day # 0  Alan Maiden, MD PGY-1  I have personally obtained history,examined this patient, reviewed notes, independently viewed imaging studies, participated in medical decision making and plan of care.ROS completed by me personally and pertinent positives fully documented  I have made any additions or clarifications directly to the above note. Agree with note above.  Patient presented with several day history of stumbling and leg weakness and has  baseline dementia.  CT head shows right frontal subacute hematoma with cytotoxic edema.  MR angiogram shows coiled basilar tip aneurysm without any recurrence or new aneurysm.  Neurological exam significant for baseline dementia with only mild left lower extremity weakness.  Continue strict blood pressure control  with systolic goal below 160.  Mobilize out of bed.  Therapy consults.  Long discussion with patient and daughter at the bedside and answered questions. This patient is critically ill and at significant risk of neurological worsening, death and care requires constant monitoring of vital signs, hemodynamics,respiratory and cardiac monitoring, extensive review of multiple databases, frequent neurological assessment, discussion with family, other specialists and medical decision making of high complexity.I have made any additions or clarifications directly to the above note.This critical care time does not reflect procedure time, or teaching time or supervisory time of PA/NP/Med Resident etc but could involve care discussion time.  I spent 30 minutes of neurocritical care time  in the care of  this patient.      Eather Popp, MD Medical Director [facility] Pager: [phone] 12/02/2023 2:26 PM    To contact Stroke Continuity provider, please refer to [email or web address]. After hours, contact General Neurology

## 2023-12-02 NOTE — Progress Notes (Signed)
 Arrived to room for EEG pt is going for CT and MRI 8am. Will try back as schedule permits.

## 2023-12-02 NOTE — H&P (Signed)
 NEUROLOGY H&P NOTE   Date of service: December 02, 2023 Patient Name: [name] [name] MRN:  [id number] DOB:  August 17, 1940 Chief Complaint: R frontal ICH  History of Present Illness  [name] [name] is a 83 y.o. female with hx of dementia, GERD, hypertension, CAD, fibromyalgia, basilar tip aneurysm s/p embolization in 2021 who is brought in with about a week and a half history of confusion and difficulty with ambulation.  Patient has dementia at baseline and most of the history is provided by her daughter at the bedside.  Daughter reports that patient lives independently but family checks in on her at least a couple hours every day and she spends every evening with the patient and makes dinner for her.  She reports that she has noticed that over the last week and a half, patient is less talkative and has been stumbling while she was walking.  She has not been doing her laundry.  She initially attributed this to her dementia as patient is known to have good and bad days.  However, given this was persistent, she decided to bring her into the ED for further evaluation and workup.  While in the ED, patient had workup with CT head without contrast which was notable for an acute appearing right frontal hemorrhage.  Patient has not had any falls that patient or her daughter is aware of.  They deny any prior history of strokes or hemorrhage.  Patient is not on anticoagulation or platelet agent.  Last known well: 11/22/2023 Modified rankin score: 2-Slight disability-UNABLE to perform all activities but does not need assistance ICH Score: 1 tNKASE: Not offered due to ICH Thrombectomy: not offered due to ICH NIHSS components Score: Comment  1a Level of Conscious 0[x]  1[]  2[]  3[]      1b LOC Questions 0[]  1[]  2[x]       1c LOC Commands 0[x]  1[]  2[]       2 Best Gaze 0[x]  1[]  2[]       3 Visual 0[x]  1[]  2[]  3[]      4 Facial Palsy 0[x]  1[]  2[]  3[]      5a Motor Arm - left 0[x]  1[]  2[]  3[]  4[]  UN[]    5b Motor Arm - Right  0[x]  1[]  2[]  3[]  4[]  UN[]    6a Motor Leg - Left 0[x]  1[]  2[]  3[]  4[]  UN[]    6b Motor Leg - Right 0[x]  1[]  2[]  3[]  4[]  UN[]    7 Limb Ataxia 0[x]  1[]  2[]  UN[]      8 Sensory 0[x]  1[]  2[]  UN[]      9 Best Language 0[x]  1[]  2[]  3[]      10 Dysarthria 0[x]  1[]  2[]  UN[]      11 Extinct. and Inattention 0[x]  1[]  2[]       TOTAL: 2      ROS  Comprehensive ROS performed and pertinent positives documented in the HPI.  Past History   Past Medical History:  Diagnosis Date   Acute kidney failure (HCC)    5 years ago   ACUTE KIDNEY FAILURE UNSPECIFIED 07/12/2009   Allergy    Anemia    Anxiety    Arthritis    Basal cell carcinoma    Blood clot in vein    states it was not deep vein   CAD (coronary artery disease)    Mild nonobstructive by CT   Cataract    Cerebral aneurysm without rupture 2021   Chronic kidney disease    stones   Depression    around the time of her husband's death  Diverticulosis of colon (without mention of hemorrhage)    Fibromyalgia    GERD (gastroesophageal reflux disease)    Headache    Hiatal hernia    History of kidney stones    Hyperlipidemia    Hypertension    Interstitial cystitis    Kidney stone    Osteopenia    PONV (postoperative nausea and vomiting)    Scoliosis    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   Qualifier: Diagnosis of  By: Jakie MD NOLIA Alm SAUNDERS    Vertigo    Past Surgical History:  Procedure Laterality Date   BLADDER TUMOR EXCISION  2001   CATARACT EXTRACTION     bilateral   CHOLECYSTECTOMY     COLOSTOMY     CYSTECTOMY  2001   benign   ESOPHAGEAL MANOMETRY N/A 07/13/2012   Procedure: ESOPHAGEAL MANOMETRY (EM);  Surgeon: Alm SAUNDERS Jakie, MD;  Location: [facility];  Service: Endoscopy;  Laterality: N/A;   HIATAL HERNIA REPAIR     IR ANGIO INTRA EXTRACRAN SEL INTERNAL CAROTID BILAT MOD SED  11/02/2019   IR ANGIO INTRA EXTRACRAN SEL INTERNAL CAROTID BILAT MOD SED  10/31/2020   IR ANGIO VERTEBRAL SEL VERTEBRAL BILAT MOD SED   12/17/2019   IR ANGIO VERTEBRAL SEL VERTEBRAL UNI L MOD SED  10/31/2020   IR ANGIO VERTEBRAL SEL VERTEBRAL UNI R MOD SED  11/02/2019   IR ANGIOGRAM FOLLOW UP STUDY  12/17/2019   IR ANGIOGRAM FOLLOW UP STUDY  12/17/2019   IR ANGIOGRAM FOLLOW UP STUDY  12/17/2019   IR ANGIOGRAM FOLLOW UP STUDY  12/17/2019   IR NEURO EACH ADD'L AFTER BASIC UNI LEFT (MS)  12/17/2019   IR TRANSCATH/EMBOLIZ  12/17/2019   LUMBAR DISC SURGERY     x 2 el[facility]ner   RADIOLOGY WITH ANESTHESIA N/A 12/17/2019   Procedure: 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;  Surgeon: Lanis Pupa, MD;  Location: [facility] OR;  Service: Radiology;  Laterality: N/A;   REVERSE SHOULDER ARTHROPLASTY Right 08/12/2014   Procedure: RIGHT REVERSE TOTAL SHOULDER ARTHROPLASTY;  Surgeon: Marcey Her, MD;  Location: [facility] OR;  Service: Orthopedics;  Laterality: Right;   TENDON REPAIR Right 11/2005   right shoulder   TOTAL KNEE ARTHROPLASTY Left    left   TOTAL KNEE ARTHROPLASTY Right 11/17/2015   Procedure: RIGHT TOTAL KNEE ARTHROPLASTY;  Surgeon: Lamar Collet, MD;  Location: [facility];  Service: Orthopedics;  Laterality: Right;   UPPER GASTROINTESTINAL ENDOSCOPY     Family History  Problem Relation Age of Onset   Alzheimer's disease Mother    Lung cancer Father        tobacco   Kidney cancer Son    Stroke Son    Arthritis Son    Hypertension Brother        mom's side   Osteoporosis Sister    Hypertension Sister    Breast cancer Cousin    Heart disease Maternal Uncle        x 4   Heart disease Maternal Aunt        x 4   Diabetes Paternal Grandmother    Diabetes Paternal Uncle        x 2   Arthritis Daughter    Colon cancer Neg Hx    Social History   Socioeconomic History   Marital status: Widowed    Spouse name: Not on file   Number of children: 2   Years of education: 26   Highest education level: Not on file  Occupational  History   Occupation: Retired   Tobacco Use   Smoking status: Never    Smokeless tobacco: Never  Vaping Use   Vaping status: Never Used  Substance and Sexual Activity   Alcohol use: Not Currently    Alcohol/week: 0.0 standard drinks of alcohol    Comment: rare   Drug use: No   Sexual activity: Not on file  Other Topics Concern   Not on file  Social History Narrative   Widowed 10/09 husband scd   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   Right handed   Lives alone   Daughter sherry   Social Drivers of Health   Financial Resource Strain: Low Risk  (06/26/2021)   Overall Financial Resource Strain (CARDIA)    Difficulty of Paying Living Expenses: Not hard at all  Food Insecurity: No Food Insecurity (12/02/2023)   Hunger Vital Sign    Worried About Running Out of Food in the Last Year: Never true    Ran Out of Food in the Last Year: Never true  Transportation Needs: No Transportation Needs (12/02/2023)   PRAPARE - Administrator, Civil Service (Medical): No    Lack of Transportation (Non-Medical): No  Physical Activity: Inactive (06/26/2021)   Exercise Vital Sign    Days of Exercise per Week: 0 days    Minutes of Exercise per Session: 0 min  Stress: No Stress Concern Present (06/26/2021)   [employer] of Occupational Health - Occupational Stress Questionnaire    Feeling of Stress : Not at all  Social Connections: Moderately Integrated (12/02/2023)   Social Connection and Isolation Panel    Frequency of Communication with Friends and Family: More than three times a week    Frequency of Social Gatherings with Friends and Family: More than three times a week    Attends Religious Services: 1 to 4 times per year    Active Member of [employer] or Organizations: Yes    Attends Banker Meetings: 1 to 4 times per year    Marital Status: Widowed   Allergies  Allergen Reactions   Gadolinium Derivatives Hives    PER DR DERRY PT NEEDS 13HOUR PREP BEFORE FUTURE CONTRASTED MRI'S//[facility]   Aspirin  Other (See Comments)    Acute  renal failure    Lactose Intolerance (Gi) Other (See Comments)    Gi upset   Meloxicam     REACTION: acute renal failure  with hypovolemic trigger Hospital  3 2011 Pt states allergic to all NSAIDS    Metoprolol      No issues with Metoprolol  extended release   Sulfamethoxazole Hives    Medications   Medications Prior to Admission  Medication Sig Dispense Refill Last Dose/Taking   amLODipine  (NORVASC ) 5 MG tablet TAKE 1 TABLET (5 MG TOTAL) BY MOUTH DAILY. 90 tablet 0 11/30/2023   DULoxetine  (CYMBALTA ) 60 MG capsule TAKE 1 CAPSULE BY MOUTH TWICE A DAY (Patient taking differently: Take 60 mg by mouth daily.) 180 capsule 1 Taking Differently   memantine  (NAMENDA ) 10 MG tablet Take half  tablet (5 mg at night) for 2 weeks, then increase to 1 tablet (5 mg) twice a day  Then increase to 1 tablet (10 mg)  twice a day 180 tablet 3 11/30/2023   metoprolol  succinate (TOPROL -XL) 25 MG 24 hr tablet TAKE 1 TABLET (25 MG TOTAL) BY MOUTH DAILY. STOP THE LOPRESSOR  (TO AVOID TWICE A DAY DOSING.) 90 tablet 1 11/30/2023   Probiotic Product (ALIGN PO) Take  1 tablet by mouth daily.    11/30/2023   donepezil  (ARICEPT ) 10 MG tablet Take 10 mg by mouth at bedtime. (Patient not taking: Reported on 12/02/2023)   Not Taking   melatonin 5 MG TABS Take 5 mg by mouth at bedtime. (Patient not taking: Reported on 12/02/2023)   Not Taking   omeprazole  (PRILOSEC) 40 MG capsule TAKE 1 CAPSULE BY MOUTH TWICE A DAY (Patient not taking: Reported on 12/02/2023) 180 capsule 3 Not Taking   rosuvastatin  (CRESTOR ) 40 MG tablet TAKE 1 TABLET BY MOUTH EVERY DAY (Patient not taking: Reported on 12/02/2023) 90 tablet 0 Not Taking   Suvorexant  (BELSOMRA ) 10 MG TABS Take 10 mg by mouth at bedtime. For sleep (Patient not taking: Reported on 12/02/2023) 30 tablet 0 Not Taking     Vitals   Vitals:   12/02/23 0445 12/02/23 0500 12/02/23 0515 12/02/23 0518  BP: (!) 159/113 (!) 145/85 (!) 88/54 118/70  Pulse: 77 76 82 77  Resp: 19 (!) 23 18 16    Temp:      TempSrc:      SpO2: 100% 100% 100% 100%  Weight:      Height:         Body mass index is 20.89 kg/m.  Physical Exam   General: Laying comfortably in bed; in no acute distress.  HENT: Normal oropharynx and mucosa. Normal external appearance of ears and nose.  Neck: Supple, no pain or tenderness  CV: No JVD. No peripheral edema.  Pulmonary: Symmetric Chest rise. Normal respiratory effort.  Abdomen: Soft to touch, non-tender.  Ext: No cyanosis, edema, or deformity Skin: No rash. Normal palpation of skin.   Musculoskeletal: ulnar deviation of digits and toes. No clubbing.   Neurologic Examination  Mental status/Cognition: Alert, oriented to self, place, but not to month and year, good attention. Speech/language: Fluent, comprehension intact, object naming intact, repetition intact.  Cranial nerves:   CN II Pupils equal and reactive to light, no VF deficits    CN III,IV,VI EOM intact, no gaze preference or deviation, no nystagmus    CN V normal sensation in V1, V2, and V3 segments bilaterally    CN VII no asymmetry, no nasolabial fold flattening    CN VIII normal hearing to speech    CN IX & X normal palatal elevation, no uvular deviation    CN XI 5/5 head turn and 5/5 shoulder shrug bilaterally    CN XII midline tongue protrusion    Motor:  Muscle bulk: poor, tone normal. Mvmt Root Nerve  Muscle Right Left Comments  SA C5/6 Ax Deltoid     EF C5/6 Mc Biceps 5 5   EE C6/7/8 Rad Triceps 5 5   WF C6/7 Med FCR     WE C7/8 PIN ECU     F Ab C8/T1 U ADM/FDI 5 5   HF L1/2/3 Fem Illopsoas 5 4   KE L2/3/4 Fem Quad     DF L4/5 D Peron Tib Ant 5 5   PF S1/2 Tibial Grc/Sol 5 5    Sensation:  Light touch Intact throughout   Pin prick    Temperature    Vibration   Proprioception    Coordination/Complex Motor:  - Finger to Nose intact bilaterally - Heel to shin intact bilaterally - Rapid alternating movement are slowed throughout - Gait: Deferred for patient  safety. Labs   CBC:  Recent Labs  Lab 12/01/23 1741  WBC 4.2  NEUTROABS 2.5  HGB 13.6  HCT  42.0  MCV 92.3  PLT 177    Basic Metabolic Panel:  Lab Results  Component Value Date   NA 138 12/01/2023   K 3.8 12/01/2023   CO2 25 12/01/2023   GLUCOSE 96 12/01/2023   BUN 19 12/01/2023   CREATININE 0.95 12/01/2023   CALCIUM  9.2 12/01/2023   GFRNONAA 60 (L) 12/01/2023   GFRAA 58 (L) 12/17/2019   Lipid Panel:  Lab Results  Component Value Date   LDLCALC 145 (H) 09/10/2022   HgbA1c:  Lab Results  Component Value Date   HGBA1C 5.5 09/10/2022   Urine Drug Screen: No results found for: LABOPIA, COCAINSCRNUR, LABBENZ, AMPHETMU, THCU, LABBARB  Alcohol Level No results found for: [facility] INR  Lab Results  Component Value Date   INR 1.0 10/31/2020   APTT  Lab Results  Component Value Date   APTT 21 (L) 12/17/2019     CT Head without contrast(Personally reviewed): R frontal ICH.  CT angio Head and Neck with contrast(Personally reviewed): Pending  MRI Brain(Personally reviewed): Pending  rEEG:  Pending  Impression   [name] [name] is a 83 y.o. female with hx of dementia, GERD, hypertension, CAD, fibromyalgia, basilar tip aneurysm s/p embolization in 2021 who is brought in with about a week and a half history of confusion and difficulty with ambulation.  She was found to have an acute intraparenchymal hematoma in the right frontal lobe with mild mass effect and effacement of the overlying sulci.  Etiology of her hemorrhage is unclear.  There is no intraventricular extension or subdural or subarachnoid hemorrhage.  There is no trauma noted on inspection on her forehead.  Patient is not on anticoagulation and is only mildly hypertensive.  Primary Diagnosis:  Nontraumatic intracerebral hemorrhage in hemisphere, subcortical  Recommendations  - Admit to ICU - Stability scan in 6-8 hours or STAT with any neurological decline - Frequent neuro checks; q31min  for 1 hour, then q1hour - No antiplatelets or anticoagulants due to ICH - SCD for DVT prophylaxis, pharmacological DVT ppx at 24 hours if ICH is stable - Blood pressure control with goal systolic 130 - 150, cleverplex and labetalol  PRN - Stroke labs, HgbA1c, fasting lipid panel - MRI brain without contrast.  Patient is allergic to gadolinium contrast. - CTA of the head and neck to evaluate for underlying vascular abnormality. - Risk factor modification - Echocardiogram - PT consult, OT consult, Speech consult. - Stroke team to follow  Hyperlipidemia: Continue home rosuvastatin .  LDL is pending. Hypertension: Hold home amlodipine .  SBP goal as above. GERD: Continue home PPI. Depression: Continue home duloxetine . Dementia: Continue home donepezil   ______________________________________________________________________  CODE STATUS discussed with patient and daughter.  Patient is full code.  Allergies verified and updated.  This patient is critically ill and at significant risk of neurological worsening, death and care requires constant monitoring of vital signs, hemodynamics,respiratory and cardiac monitoring, neurological assessment, discussion with family, other specialists and medical decision making of high complexity. I spent 60 minutes of neurocritical care time  in the care of  this patient. This was time spent independent of any time provided by nurse practitioner or PA.  Einer Meals [facility] 12/02/2023  6:08 AM   Signed,  Chrstopher Malenfant, MD [facility]

## 2023-12-02 NOTE — Progress Notes (Signed)
 EEG complete - results pending

## 2023-12-03 ENCOUNTER — Inpatient Hospital Stay (HOSPITAL_COMMUNITY)

## 2023-12-03 DIAGNOSIS — K219 Gastro-esophageal reflux disease without esophagitis: Secondary | ICD-10-CM

## 2023-12-03 DIAGNOSIS — F32A Depression, unspecified: Secondary | ICD-10-CM

## 2023-12-03 DIAGNOSIS — I69391 Dysphagia following cerebral infarction: Secondary | ICD-10-CM

## 2023-12-03 DIAGNOSIS — I611 Nontraumatic intracerebral hemorrhage in hemisphere, cortical: Secondary | ICD-10-CM | POA: Diagnosis not present

## 2023-12-03 DIAGNOSIS — M797 Fibromyalgia: Secondary | ICD-10-CM | POA: Diagnosis not present

## 2023-12-03 DIAGNOSIS — F0390 Unspecified dementia without behavioral disturbance: Secondary | ICD-10-CM | POA: Diagnosis not present

## 2023-12-03 DIAGNOSIS — I680 Cerebral amyloid angiopathy: Secondary | ICD-10-CM

## 2023-12-03 DIAGNOSIS — M79661 Pain in right lower leg: Secondary | ICD-10-CM

## 2023-12-03 DIAGNOSIS — I619 Nontraumatic intracerebral hemorrhage, unspecified: Secondary | ICD-10-CM | POA: Diagnosis not present

## 2023-12-03 DIAGNOSIS — E859 Amyloidosis, unspecified: Secondary | ICD-10-CM

## 2023-12-03 DIAGNOSIS — R29702 NIHSS score 2: Secondary | ICD-10-CM | POA: Diagnosis not present

## 2023-12-03 DIAGNOSIS — I10 Essential (primary) hypertension: Secondary | ICD-10-CM

## 2023-12-03 LAB — GLUCOSE, CAPILLARY
Glucose-Capillary: 118 mg/dL — ABNORMAL HIGH (ref 70–99)
Glucose-Capillary: 115 mg/dL — ABNORMAL HIGH (ref 70–99)
Glucose-Capillary: 106 mg/dL — ABNORMAL HIGH (ref 70–99)
Glucose-Capillary: 157 mg/dL — ABNORMAL HIGH (ref 70–99)

## 2023-12-03 LAB — HEMOGLOBIN A1C
Hgb A1c MFr Bld: 5.2 % (ref 4.8–5.6)
Mean Plasma Glucose: 103 mg/dL

## 2023-12-03 MED ORDER — DULOXETINE HCL 60 MG PO CPEP
60.0000 mg | ORAL_CAPSULE | Freq: Every day | ORAL | Status: DC
  Administered 2023-12-03 – 2023-12-05 (×4): 60 mg via ORAL
  Filled 2023-12-03: qty 2
  Filled 2023-12-03 (×2): qty 1

## 2023-12-03 MED ORDER — MEMANTINE HCL 10 MG PO TABS
10.0000 mg | ORAL_TABLET | Freq: Two times a day (BID) | ORAL | Status: DC
  Administered 2023-12-03 – 2023-12-05 (×7): 10 mg via ORAL
  Filled 2023-12-03 (×6): qty 1

## 2023-12-03 MED ORDER — HEPARIN SODIUM (PORCINE) 5000 UNIT/ML IJ SOLN
5000.0000 [IU] | Freq: Three times a day (TID) | INTRAMUSCULAR | Status: DC
  Administered 2023-12-03 – 2023-12-05 (×9): 5000 [IU] via SUBCUTANEOUS
  Filled 2023-12-03 (×7): qty 1

## 2023-12-03 NOTE — Progress Notes (Signed)
 BLE venous duplex has been completed.   Results can be found under chart review under CV PROC. 12/03/2023 4:02 PM Wajiha Versteeg RVT, RDMS

## 2023-12-03 NOTE — PMR Pre-admission (Signed)
 PMR Admission Coordinator Pre-Admission Assessment  Patient: [name] [name] is an 83 y.o., female MRN: [id number] DOB: 23-May-1940 Height: 5' 3 (160 cm) Weight: 53.5 kg              Insurance Information HMO: yes    PPO:      PCP:      IPA:      80/20:      OTHER:  PRIMARY: [employer]      Policy#: [id number]       Subscriber: pt CM Name: [name] [name]      Phone#: [phone] ext [phone]     Fax#: [phone] Pre-Cert#: [id number] approved for 7 days 8/15 until 8/22 f/u with [name] [name] phone [phone] ext [phone] fax [phone]      Employer:  Benefits:  Phone #: [phone]     Name:  Eff. Date: 04/23/23     Deduct: none      Out of Pocket Max: $9350      Life Max: none  CIR: $399 co pay per day days 1 until 6      SNF: no co pay per day days1 until 20; $214 co pay per day days 21 until 100 Outpatient: $ 25 per visit     Co-Pay: none Home Health: 100%      Co-Pay:  DME: 80%     Co-Pay: 20% Providers:  SECONDARY:       Policy#:       Phone#:   Artist:       Phone#:   The Engineer, materials Information Summary" for patients in Inpatient Rehabilitation Facilities with attached "Privacy Act Statement-Health Care Records" was provided and verbally reviewed with: Family  Emergency Contact Information Contact Information     Name Relation Home Work Mobile   [name] [name] Daughter [phone]  [phone]   [name] [name] [phone]        Other Contacts   None on File    Current Medical History  Patient Admitting Diagnosis: ICH   History of Present Illness: Pt is an 83 y/o female with PMH of CAD, CKD, HTN, fibromyalgia, basilar tip aneurysm s/p embolization in 2021, and interstitial cystitis who presented to [facility] on 8/12 with difficulty ambulating.  CT head showed acute right frontal hemorrhage with mild mass effect with effacement of the overlying sulci.  No MLS.  CTA head/neck no acute hemorrhage or ischemic change but did show 2 mm aneurysm in the Acomm  artery as well as moderate to severe focal stenosis at the junction of the left P1 and P2 segment.  MRI confirmed intra axial hemorrhages in the right hemisphere including the superior frontal gyrus, right inferior frontal gyrus, and underlying microhemorrhages in the left parietal and frontal lobes which appear increased from previous imaging.  ECHO showed EF 60-70%.  She did require cleviprex  for tight BP control, goal <160.  Therapy evaluations completed and pt was recommended for CIR due to functional decline.   Complete NIHSS TOTAL: 0 Glasgow Coma Scale Score: 14  Patient's medical record from [name] [name] has been reviewed by the rehabilitation admission coordinator and physician.  Past Medical History  Past Medical History:  Diagnosis Date   Acute kidney failure (HCC)    5 years ago   ACUTE KIDNEY FAILURE UNSPECIFIED 07/12/2009   Allergy    Anemia    Anxiety    Arthritis    Basal cell carcinoma    Blood clot in vein    states it was not deep vein  CAD (coronary artery disease)    Mild nonobstructive by CT   Cataract    Cerebral aneurysm without rupture 2021   Chronic kidney disease    stones   Depression    around the time of her husband's death   Diverticulosis of colon (without mention of hemorrhage)    Fibromyalgia    GERD (gastroesophageal reflux disease)    Headache    Hiatal hernia    History of kidney stones    Hyperlipidemia    Hypertension    Interstitial cystitis    Kidney stone    Osteopenia    PONV (postoperative nausea and vomiting)    Scoliosis    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   Qualifier: Diagnosis of  By: Jakie MD NOLIA Alm SAUNDERS    Vertigo     Has the patient had major surgery during 100 days prior to admission? No  Family History  family history includes Alzheimer's disease in her mother; Arthritis in her daughter and son; Breast cancer in her cousin; Diabetes in her paternal grandmother and paternal uncle; Heart disease in her maternal aunt  and maternal uncle; Hypertension in her brother and sister; Kidney cancer in her son; Lung cancer in her father; Osteoporosis in her sister; Stroke in her son.   Current Medications   Current Facility-Administered Medications:    acetaminophen  (TYLENOL ) tablet 650 mg, 650 mg, Oral, Q4H PRN **OR** acetaminophen  (TYLENOL ) 160 MG/5ML solution 650 mg, 650 mg, Per Tube, Q4H PRN **OR** acetaminophen  (TYLENOL ) suppository 650 mg, 650 mg, Rectal, Q4H PRN, Khaliqdina, Salman, MD   amLODipine  (NORVASC ) tablet 10 mg, 10 mg, Oral, Daily, Gonfa, Taye T, MD, 10 mg at 12/05/23 9070   Chlorhexidine  Gluconate Cloth 2 % PADS 6 each, 6 each, Topical, Daily, Khaliqdina, Salman, MD, 6 each at 12/04/23 2148   DULoxetine  (CYMBALTA ) DR capsule 60 mg, 60 mg, Oral, Daily, Sethi, Pramod S, MD, 60 mg at 12/05/23 9072   heparin  injection 5,000 Units, 5,000 Units, Subcutaneous, Q8H, Sethi, Pramod S, MD, 5,000 Units at 12/05/23 9447   hydrALAZINE  (APRESOLINE ) injection 10 mg, 10 mg, Intravenous, Q6H PRN, Lehner, Erin C, NP   labetalol  (NORMODYNE ) injection 10 mg, 10 mg, Intravenous, Q2H PRN, Lehner, Erin C, NP, 10 mg at 12/03/23 0116   memantine  (NAMENDA ) tablet 10 mg, 10 mg, Oral, BID, Sethi, Pramod S, MD, 10 mg at 12/05/23 9072   metoprolol  succinate (TOPROL -XL) 24 hr tablet 12.5 mg, 12.5 mg, Oral, Daily, Gonfa, Taye T, MD, 12.5 mg at 12/05/23 9072   Oral care mouth rinse, 15 mL, Mouth Rinse, PRN, Khaliqdina, Salman, MD   rosuvastatin  (CRESTOR ) tablet 20 mg, 20 mg, Oral, Daily, Gonfa, Taye T, MD, 20 mg at 12/05/23 9070   senna-docusate (Senokot-S) tablet 1 tablet, 1 tablet, Oral, BID, Khaliqdina, Salman, MD, 1 tablet at 12/05/23 9072  Patients Current Diet:  Diet Order             Diet - low sodium heart healthy           Diet regular Room service appropriate? Yes; Fluid consistency: Thin  Diet effective now                   Precautions / Restrictions Precautions Precautions: Fall Precaution/Restrictions  Comments: SBP<160 Restrictions Weight Bearing Restrictions Per Provider Order: No   Has the patient had 2 or more falls or a fall with injury in the past year? Unknown  Prior Activity Level Household: home at baseline with assist for cooking/housework, but  mod I with mobility/ADLS, daughter comes daily after work to spend time with her, cook dinner, etc.  Prior Functional Level Prior Function Prior Level of Function : Needs assist Mobility Comments: assisted for meal prep and housekeeping, pt does not drive ADLs Comments: independent in basic self care  Self Care: Did the patient need help bathing, dressing, using the toilet or eating?  Independent  Indoor Mobility: Did the patient need assistance with walking from room to room (with or without device)? Independent  Stairs: Did the patient need assistance with internal or external stairs (with or without device)? Independent  Functional Cognition: Did the patient need help planning regular tasks such as shopping or remembering to take medications? Needed some help  Patient Information Are you of Hispanic, Latino/a,or Spanish origin?: A. No, not of Hispanic, Latino/a, or Spanish origin What is your race?: A. White Do you need or want an interpreter to communicate with a doctor or health care staff?: 0. No Patient information obtained via proxy : chart  Patient's Response To:  Health Literacy and Transportation Is the patient able to respond to health literacy and transportation needs?: No Health Literacy - How often do you need to have someone help you when you read instructions, pamphlets, or other written material from your doctor or pharmacy?: Patient unable to respond In the past 12 months, has lack of transportation kept you from medical appointments or from getting medications?: No In the past 12 months, has lack of transportation kept you from meetings, work, or from getting things needed for daily living?: No Health  Literacy and Transportation obtained via proxy: per daughter  Journalist, newspaper / Equipment Home Equipment: Agricultural consultant (2 wheels), [employer] - single point  Prior Device Use: Indicate devices/aids used by the patient prior to current illness, exacerbation or injury? None of the above  Current Functional Level Cognition  Arousal/Alertness: Awake/alert Overall Cognitive Status: Impaired/Different from baseline Orientation Level: Oriented to person, Oriented to place, Disoriented to time, Disoriented to situation Attention: Sustained Sustained Attention: Appears intact Memory: Impaired Memory Impairment:  (0/4 words 1/4 correct with category cue, 2/4 incorrect with choice) Awareness: Impaired Awareness Impairment: Intellectual impairment, Anticipatory impairment, Emergent impairment Problem Solving: Impaired Problem Solving Impairment: Verbal basic (3/4 correct) Safety/Judgment: Impaired    Extremity Assessment (includes Sensation/Coordination)  Upper Extremity Assessment: Right hand dominant, Generalized weakness  Lower Extremity Assessment: Defer to PT evaluation    ADLs  Overall ADL's : Needs assistance/impaired Eating/Feeding: Set up, Sitting Grooming: Brushing hair, Moderate assistance, Sitting Grooming Details (indicate cue type and reason): decreased thoroughness Upper Body Bathing: Maximal assistance, Sitting Lower Body Bathing: Total assistance, +2 for physical assistance, Sit to/from stand Upper Body Dressing : Maximal assistance, Sitting Lower Body Dressing: Total assistance, Bed level Toilet Transfer: +2 for physical assistance, Stand-pivot, Minimal assistance, Rolling walker (2 wheels), BSC/3in1 Toileting- Clothing Manipulation and Hygiene: Maximal assistance, +2 for physical assistance, Sit to/from stand Toileting - Clothing Manipulation Details (indicate cue type and reason): pt able to complete pericare with assist for standing balance and clothing  management Functional mobility during ADLs: Moderate assistance, +2 for safety/equipment, Rolling walker (2 wheels)    Mobility  Overal bed mobility: Needs Assistance Bed Mobility: Supine to Sit Supine to sit: Min assist General bed mobility comments: cues for initiation and sequencing. minA for trunk elevation and scooting forward to EOB    Transfers  Overall transfer level: Needs assistance Equipment used: Rolling walker (2 wheels) Transfers: Sit to/from Stand Sit to Stand: Min  assist, +2 physical assistance, +2 safety/equipment Bed to/from chair/wheelchair/BSC transfer type:: Step pivot Step pivot transfers: Mod assist, +2 physical assistance General transfer comment: Min A +2 progressing to min A +1 to stand from EOB and recliner. cues for hand placement    Ambulation / Gait / Stairs / Wheelchair Mobility  Ambulation/Gait Ambulation/Gait assistance: Min assist, +2 safety/equipment Gait Distance (Feet): 75 Feet (x2) Assistive device: Rolling walker (2 wheels) Gait Pattern/deviations: Decreased stride length, Shuffle, Trunk flexed, Step-through pattern, Drifts right/left General Gait Details: Pt demonstrates short steps with low foot clearance, but is able to briefly increase step length with frequent cues. Cues for upright posture and assist for RW management due to L drift    Posture / Balance Dynamic Sitting Balance Sitting balance - Comments: sitting EOB Balance Overall balance assessment: Needs assistance Sitting-balance support: Bilateral upper extremity supported, Feet supported Sitting balance-Leahy Scale: Fair Sitting balance - Comments: sitting EOB Standing balance support: Bilateral upper extremity supported, During functional activity, Reliant on assistive device for balance Standing balance-Leahy Scale: Poor Standing balance comment: with RW support. Initial posterior bias requiring assist, but improves during ambulation    Special considerations/ Life events  Diabetic management A1C 5.5     Previous Home Environment (from acute therapy documentation) Living Arrangements: Alone  Lives With: Alone Available Help at Discharge: Family, Available PRN/intermittently Type of Home: House Home Layout: One level Home Access: Stairs to enter Secretary/administrator of Steps: 1 Bathroom Shower/Tub: Psychologist, counselling Home Care Services: No Additional Comments: daughter recently got her RW due to imbalance  Discharge Living Setting Plans for Discharge Living Setting: Patient's home (compilation of family and hired caregivers) Type of Home at Discharge: House Discharge Home Layout: One level Discharge Home Access: Stairs to enter Entrance Stairs-Rails: None Entrance Stairs-Number of Steps: 1 Discharge Bathroom Shower/Tub: Walk-in shower Discharge Bathroom Toilet: Handicapped height Discharge Bathroom Accessibility: Yes How Accessible: Accessible via walker Does the patient have any problems obtaining your medications?: No  Social/Family/Support Systems Anticipated Caregiver: daughter, Joen Anticipated Caregiver's Contact Information: [phone] Ability/Limitations of Caregiver: Joen works, working on pulling together initial 24/7 supervision/assist for discharge (family, friends, hired caregivers) Engineer, structural Availability: 24/7 Discharge Plan Discussed with Primary Caregiver: Yes Is Caregiver In Agreement with Plan?: Yes Does Caregiver/Family have Issues with Lodging/Transportation while Pt is in Rehab?: No   Goals Patient/Family Goal for Rehab: PT/OT supervision to min assist, SLP min assist Expected length of stay: 12-16 days Additional Information: Discharge plan: home with 24/7 assist from family and caregivers Pt/Family Agrees to Admission and willing to participate: Yes Program Orientation Provided & Reviewed with Pt/Caregiver Including Roles  & Responsibilities: Yes   Decrease burden of Care through IP rehab admission:  n/a   Possible need for SNF placement upon discharge: Not anticipated.  Plan for discharge back to patient's home with 24/7 supervision/assist from daughter, other family and hired caregivers.     Patient Condition: This patient's condition remains as documented in the consult dated 12/03/23, in which the Rehabilitation Physician determined and documented that the patient's condition is appropriate for intensive rehabilitative care in an inpatient rehabilitation facility. Will admit to inpatient rehab pending insurance approval today.  Preadmission Screen Completed By:  Alison Heron Lot, RN, DPT 12/05/2023 12:16 PM ______________________________________________________________________   Discussed status with Dr. Babs on 12/05/23 at 1217 and received approval for admission today.  Admission Coordinator:  Alison Heron Lot, time 8782 Date 12/05/23

## 2023-12-03 NOTE — TOC Initial Note (Signed)
 Transition of Care [facility]) - Initial/Assessment Note    Patient Details  Name: [name] [name] MRN: [id number] Date of Birth: 08/11/1940  [facility]) CM/SW Contact:    Lauraine FORBES Saa, LCSWA Phone Number: 12/03/2023, 9:13 AM  Clinical Narrative:                  9:13 AM Per chart review, physical therapy recommended patient discharge to [facility]. [facility] admissions placed rehab consult for full assessment as patient is a potential candidate for admission to [facility]. [facility] admissions and TOC will continue to follow.  Expected Discharge Plan: IP Rehab Facility Barriers to Discharge: Continued Medical Work up, English as a second language teacher   Patient Goals and CMS Choice            Expected Discharge Plan and Services In-house Referral: Clinical Social Work Discharge Planning Services: CM Consult   Living arrangements for the past 2 months: Single Family Home                                      Prior Living Arrangements/Services Living arrangements for the past 2 months: Single Family Home Lives with:: Self Patient language and need for interpreter reviewed:: Yes        Need for Family Participation in Patient Care: Yes (Comment)   Current home services: DME Criminal Activity/Legal Involvement Pertinent to Current Situation/Hospitalization: No - Comment as needed  Activities of Daily Living      Permission Sought/Granted Permission sought to share information with : Family Supports, Oceanographer granted to share information with : No (Contact information on chart)  Share Information with NAME: Joen Lower  Permission granted to share info w AGENCY: [facility]  Permission granted to share info w Relationship: Daughter  Permission granted to share info w Contact Information: [phone]  Emotional Assessment   Attitude/Demeanor/Rapport: Unable to Assess Affect (typically observed): Unable to Assess Orientation: :  Oriented to Self, Oriented to Situation Alcohol / Substance Use: Not Applicable Psych Involvement: No (comment)  Admission diagnosis:  Hemorrhagic stroke (HCC) [I61.9] Weakness [R53.1] Stroke (cerebrum) [facility]) [I63.9] Patient Active Problem List   Diagnosis Date Noted   Stroke (cerebrum) (HCC) 12/02/2023   Memory impairment 10/16/2022   Brain aneurysm 12/17/2019   Cerebral aneurysm 12/17/2019   Altered bowel function 12/15/2019   S/P knee replacement 11/17/2015   Primary osteoarthritis of right knee 11/17/2015   S/P shoulder replacement 08/12/2014   Primary osteoarthritis involving multiple joints 03/21/2014   CKD (chronic kidney disease), stage III (HCC) 12/16/2013   Medication management 09/17/2013   Visit for preventive health examination 06/16/2013   Hyperglycemia 06/16/2013   Agatston coronary artery calcium  score between 200 and 399 04/13/2012   Breast lump on left side at 9 o'clock position 02/12/2012   Skin lesion 02/12/2012   Vitamin B 12 deficiency 11/14/2011   Gynecomastia 07/18/2011   Fatigue 06/01/2011   Chest tightness or pressure 06/01/2011   Sleep difficulties 03/24/2011   Intentional underdosing of medication regimen by patient due to financial hardship 03/24/2011   ABDOMINAL BLOATING 10/10/2009   LUMP OR MASS IN BREAST 08/28/2009   ANEMIA 07/26/2009   ABDOMINAL PAIN OTHER SPECIFIED SITE 07/12/2009   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/2009   RENAL CALCULUS, HX OF 01/04/2009   IRON DEFICIENCY 11/15/2008   FIBROMYALGIA 11/15/2008   VITAMIN D  DEFICIENCY 10/10/2008   ANEMIA-B12 DEFICIENCY 05/10/2008   ADJ  DISORDER WITH MIXED ANXIETY & DEPRESSED MOOD 05/05/2008   DYSPHAGIA UNSPECIFIED 05/02/2008   History of IBS 05/02/2008   VARICOSE VEINS, LOWER EXTREMITIES 04/12/2008   IBS 04/12/2008   OSTEOPENIA 04/12/2008   Adjustment disorder with depressed mood 02/16/2008   CHEST PAIN 02/16/2008   Hyperlipidemia 12/24/2006   ANXIETY 12/24/2006   Essential hypertension 12/24/2006    GERD 12/24/2006   HIATAL HERNIA 12/24/2006   Osteoarthritis 12/24/2006   PCP:  Charlett Apolinar POUR, MD Pharmacy:   [facility] [phone] [address], [place] - [address] [place] [address] Phone: [phone] Fax: [phone]     Social Drivers of Health (SDOH) Social History: SDOH Screenings   Food Insecurity: No Food Insecurity (12/02/2023)  Housing: Low Risk  (12/02/2023)  Transportation Needs: No Transportation Needs (12/02/2023)  Utilities: Not At Risk (12/02/2023)  Alcohol Screen: Low Risk  (06/26/2021)  Depression (PHQ2-9): Low Risk  (02/21/2022)  Financial Resource Strain: Low Risk  (06/26/2021)  Physical Activity: Inactive (06/26/2021)  Social Connections: Moderately Integrated (12/02/2023)  Stress: No Stress Concern Present (06/26/2021)  Tobacco Use: Low Risk  (12/01/2023)   SDOH Interventions:     Readmission Risk Interventions     No data to display

## 2023-12-03 NOTE — Consult Note (Signed)
 Physical Medicine and Rehabilitation Consult Reason for Consult: Rehab Referring Physician: Dr. Lanis   HPI: [name] [name] is a 84 y.o. female with PMH of CAD, fibromyalgia,  depression, GERD, dementia, aneurysm s/p embolization who presented due to confusion and ambulation difficulty.  Wife CT head on 12/01/2023 showed acute intraparenchymal hematoma involving the superior frontal gyrus of the right frontal lobe, mild mass effect with effacement of the overlying sulci.  MRI of the brain 8/12 showed to acute/subacute intra-axial hemorrhages in the right hemisphere, superior frontal gyrus hematoma and smaller right inferior frontal gyrus hematoma.  Etiology felt to be hypertensive versus amyloid angiopathy.  Patient was found to have weakness in her left lower extremity.  SLP has been consulted for poststroke dysphagia.  Patient was seen by neurosurgery, continue supportive care per neurology recommended.  EEG with no epileptiform discharges.  Patient was evaluated by PT and OT and found to have functional deficits. She requires max to total assist with bathing and dressing, moderate assist with grooming, mod assist/max assist for supine to sitting, mod assist for step pivot transfers with rolling walker.  Patient found to be a good candidate for acute inpatient rehabilitation. Per chart review patient lives independently. 1 step to enter in 1 level home.  Using a walker at home.  Patient was not very active prior to admission, had poor balance.    Home: Home Living Family/patient expects to be discharged to:: Private residence Living Arrangements: Alone Available Help at Discharge: Family Type of Home: House Home Access: Stairs to enter Secretary/administrator of Steps: 1 Home Layout: One level Bathroom Shower/Tub: Walk-in shower Home Equipment: Agricultural consultant (2 wheels), [employer] - single point Additional Comments: daughter recently got her RW due to imbalance  Functional  History: Prior Function Prior Level of Function : Independent/Modified Independent Mobility Comments: daughter checked on her frequently; though usually completed ADL's and some IADL's independent Functional Status:  Mobility: Bed Mobility Overal bed mobility: Needs Assistance Bed Mobility: Supine to Sit Supine to sit: Mod assist, Max assist General bed mobility comments: increased time, step by step instructions needed and A for initiation for moving legs off EOB, lifting trunk and scooting hips Transfers Overall transfer level: Needs assistance Equipment used: Rolling walker (2 wheels) Transfers: Sit to/from Stand, Bed to chair/wheelchair/BSC Sit to Stand: Min assist Bed to/from chair/wheelchair/BSC transfer type:: Step pivot Step pivot transfers: Mod assist General transfer comment: using wide walker that was in her room, A for balance with sit to stand and mod cues for sequencing, assist for stepping to recliner, pt incontinent of stool then A for stepping to [facility] and back to recliner      ADL:    Cognition: Cognition Orientation Level: Oriented to person, Oriented to situation, Disoriented to time, Disoriented to place Cognition Arousal: Alert Behavior During Therapy: [facility] for tasks assessed/performed   Review of Systems  Constitutional:  Negative for chills and fever.  HENT:  Negative for hearing loss.   Eyes:  Negative for blurred vision and double vision.  Respiratory:  Negative for cough and shortness of breath.   Cardiovascular:  Negative for chest pain.  Gastrointestinal:  Negative for abdominal pain, constipation, diarrhea, nausea and vomiting.  Genitourinary: Negative.  Negative for dysuria.  Musculoskeletal:  Positive for back pain (scoliosis). Negative for joint pain and neck pain.  Skin:  Negative for rash.  Neurological:  Positive for focal weakness. Negative for sensory change, speech change and headaches.  Psychiatric/Behavioral:  Negative for  depression.  The patient is not nervous/anxious.    Past Medical History:  Diagnosis Date   Acute kidney failure (HCC)    5 years ago   ACUTE KIDNEY FAILURE UNSPECIFIED 07/12/2009   Allergy    Anemia    Anxiety    Arthritis    Basal cell carcinoma    Blood clot in vein    states it was not deep vein   CAD (coronary artery disease)    Mild nonobstructive by CT   Cataract    Cerebral aneurysm without rupture 2021   Chronic kidney disease    stones   Depression    around the time of her husband's death   Diverticulosis of colon (without mention of hemorrhage)    Fibromyalgia    GERD (gastroesophageal reflux disease)    Headache    Hiatal hernia    History of kidney stones    Hyperlipidemia    Hypertension    Interstitial cystitis    Kidney stone    Osteopenia    PONV (postoperative nausea and vomiting)    Scoliosis    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   Qualifier: Diagnosis of  By: Jakie MD NOLIA Alm SAUNDERS    Vertigo    Past Surgical History:  Procedure Laterality Date   BLADDER TUMOR EXCISION  2001   CATARACT EXTRACTION     bilateral   CHOLECYSTECTOMY     COLOSTOMY     CYSTECTOMY  2001   benign   ESOPHAGEAL MANOMETRY N/A 07/13/2012   Procedure: ESOPHAGEAL MANOMETRY (EM);  Surgeon: Alm SAUNDERS Jakie, MD;  Location: [facility];  Service: Endoscopy;  Laterality: N/A;   HIATAL HERNIA REPAIR     IR ANGIO INTRA EXTRACRAN SEL INTERNAL CAROTID BILAT MOD SED  11/02/2019   IR ANGIO INTRA EXTRACRAN SEL INTERNAL CAROTID BILAT MOD SED  10/31/2020   IR ANGIO VERTEBRAL SEL VERTEBRAL BILAT MOD SED  12/17/2019   IR ANGIO VERTEBRAL SEL VERTEBRAL UNI L MOD SED  10/31/2020   IR ANGIO VERTEBRAL SEL VERTEBRAL UNI R MOD SED  11/02/2019   IR ANGIOGRAM FOLLOW UP STUDY  12/17/2019   IR ANGIOGRAM FOLLOW UP STUDY  12/17/2019   IR ANGIOGRAM FOLLOW UP STUDY  12/17/2019   IR ANGIOGRAM FOLLOW UP STUDY  12/17/2019   IR NEURO EACH ADD'L AFTER BASIC UNI LEFT (MS)  12/17/2019   IR TRANSCATH/EMBOLIZ   12/17/2019   LUMBAR DISC SURGERY     x 2 el[facility]ner   RADIOLOGY WITH ANESTHESIA N/A 12/17/2019   Procedure: 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;  Surgeon: Lanis Pupa, MD;  Location: [facility];  Service: Radiology;  Laterality: N/A;   REVERSE SHOULDER ARTHROPLASTY Right 08/12/2014   Procedure: RIGHT REVERSE TOTAL SHOULDER ARTHROPLASTY;  Surgeon: Marcey Her, MD;  Location: [facility] OR;  Service: Orthopedics;  Laterality: Right;   TENDON REPAIR Right 11/2005   right shoulder   TOTAL KNEE ARTHROPLASTY Left    left   TOTAL KNEE ARTHROPLASTY Right 11/17/2015   Procedure: RIGHT TOTAL KNEE ARTHROPLASTY;  Surgeon: Lamar Collet, MD;  Location: [facility];  Service: Orthopedics;  Laterality: Right;   UPPER GASTROINTESTINAL ENDOSCOPY     Family History  Problem Relation Age of Onset   Alzheimer's disease Mother    Lung cancer Father        tobacco   Kidney cancer Son    Stroke Son    Arthritis Son    Hypertension Brother        mom's side  Osteoporosis Sister    Hypertension Sister    Breast cancer Cousin    Heart disease Maternal Uncle        x 4   Heart disease Maternal Aunt        x 4   Diabetes Paternal Grandmother    Diabetes Paternal Uncle        x 2   Arthritis Daughter    Colon cancer Neg Hx    Social History:  reports that she has never smoked. She has never used smokeless tobacco. She reports that she does not currently use alcohol. She reports that she does not use drugs. Allergies:  Allergies  Allergen Reactions   Gadolinium Derivatives Hives    PER DR DERRY PT NEEDS 13HOUR PREP BEFORE FUTURE CONTRASTED MRI'S//KMS   Aspirin  Other (See Comments)    Acute renal failure    Lactose Intolerance (Gi) Other (See Comments)    Gi upset   Meloxicam     REACTION: acute renal failure  with hypovolemic trigger Hospital  3 2011 Pt states allergic to all NSAIDS    Metoprolol      No issues with Metoprolol  extended release   Sulfamethoxazole Hives    Medications Prior to Admission  Medication Sig Dispense Refill   amLODipine  (NORVASC ) 5 MG tablet TAKE 1 TABLET (5 MG TOTAL) BY MOUTH DAILY. 90 tablet 0   DULoxetine  (CYMBALTA ) 60 MG capsule TAKE 1 CAPSULE BY MOUTH TWICE A DAY (Patient taking differently: Take 60 mg by mouth daily.) 180 capsule 1   memantine  (NAMENDA ) 10 MG tablet Take half  tablet (5 mg at night) for 2 weeks, then increase to 1 tablet (5 mg) twice a day  Then increase to 1 tablet (10 mg)  twice a day 180 tablet 3   metoprolol  succinate (TOPROL -XL) 25 MG 24 hr tablet TAKE 1 TABLET (25 MG TOTAL) BY MOUTH DAILY. STOP THE LOPRESSOR  (TO AVOID TWICE A DAY DOSING.) 90 tablet 1   Probiotic Product (ALIGN PO) Take 1 tablet by mouth daily.      donepezil  (ARICEPT ) 10 MG tablet Take 10 mg by mouth at bedtime. (Patient not taking: Reported on 12/02/2023)     melatonin 5 MG TABS Take 5 mg by mouth at bedtime. (Patient not taking: Reported on 12/02/2023)     omeprazole  (PRILOSEC) 40 MG capsule TAKE 1 CAPSULE BY MOUTH TWICE A DAY (Patient not taking: Reported on 12/02/2023) 180 capsule 3   rosuvastatin  (CRESTOR ) 40 MG tablet TAKE 1 TABLET BY MOUTH EVERY DAY (Patient not taking: Reported on 12/02/2023) 90 tablet 0   Suvorexant  (BELSOMRA ) 10 MG TABS Take 10 mg by mouth at bedtime. For sleep (Patient not taking: Reported on 12/02/2023) 30 tablet 0     Blood pressure (!) 143/87, pulse 75, temperature 99.1 F (37.3 C), temperature source Oral, resp. rate 12, height 5' 3 (1.6 m), weight 53.5 kg, SpO2 99%. Physical Exam  General: No apparent distress HEENT: Head is normocephalic, atraumatic, sclera anicteric, oral mucosa a little dry Heart: Reg rate and rhythm. No murmurs rubs or gallops Chest: CTA bilaterally without wheezes, rales, or rhonchi; no distress Abdomen: Soft, non-tender, non-distended, bowel sounds positive. Extremities: No clubbing, cyanosis, or edema. Pulses are 2+ Psych: Pt's affect is appropriate. Pt is cooperative Skin: Clean  and intact without signs of breakdown Neuro:    Mental Status: AAO to person, city not hospital, not date, not situation, memory deficits, can spell WORLD forwards not backwards, delayed responses Speech/Languate: Naming and repetition intact,  fluent, follows simple commands CRANIAL NERVES: II: PERRL. Visual fields full III, IV, VI: EOM intact, no gaze preference or deviation V: normal sensation bilaterally VII: no asymmetry VIII: normal hearing to speech IX, X: normal palatal elevation XI: New asymmetry noted XII: Tongue midline   MOTOR: RUE: 4/5 Deltoid, 4/5 Biceps, 4/5 Triceps,4/5 Grip LUE: 4/5 Deltoid, 4/5 Biceps, 4/5 Triceps, 4/5 Grip RLE: HF 3/5, KE 4/5, ADF 3/5, APF 3/5 LLE: HF 4-/5, KE 4-/5, ADF 3/5, APF 3/5  SENSORY: Normal to touch all 4 extremities  Coordination: Normal finger to nose and heel to shin, no tremor, no dysmetria  MSK: interensic hand atrophy b/l, arthritic changes in b/l hands, healed TKA incisions Addressed patient's feet are plantarflexed, able to passively dorsiflex to about neutral    Results for orders placed or performed during the hospital encounter of 12/01/23 (from the past 24 hours)  Glucose, capillary     Status: None   Collection Time: 12/02/23 11:48 AM  Result Value Ref Range   Glucose-Capillary 82 70 - 99 mg/dL  Glucose, capillary     Status: Abnormal   Collection Time: 12/02/23  3:02 PM  Result Value Ref Range   Glucose-Capillary 168 (H) 70 - 99 mg/dL  Glucose, capillary     Status: Abnormal   Collection Time: 12/02/23  7:47 PM  Result Value Ref Range   Glucose-Capillary 130 (H) 70 - 99 mg/dL  Glucose, capillary     Status: Abnormal   Collection Time: 12/02/23 11:14 PM  Result Value Ref Range   Glucose-Capillary 108 (H) 70 - 99 mg/dL  Glucose, capillary     Status: Abnormal   Collection Time: 12/03/23  3:25 AM  Result Value Ref Range   Glucose-Capillary 118 (H) 70 - 99 mg/dL  Glucose, capillary     Status: Abnormal    Collection Time: 12/03/23  7:35 AM  Result Value Ref Range   Glucose-Capillary 115 (H) 70 - 99 mg/dL   ECHOCARDIOGRAM COMPLETE Result Date: 12/02/2023    ECHOCARDIOGRAM REPORT   Patient Name:   [name] [name] Date of Exam: 12/02/2023 Medical Rec #:  [id number]    Height:       63.0 in Accession #:    [id number]   Weight:       117.9 lb Date of Birth:  Apr 21, 1941    BSA:          1.545 m Patient Age:    82 years     BP:           135/71 mmHg Patient Gender: F            HR:           81 bpm. Exam Location:  Inpatient Procedure: 2D Echo, Cardiac Doppler and Color Doppler (Both Spectral and Color            Flow Doppler were utilized during procedure). Indications:    Stroke  History:        Patient has no prior history of Echocardiogram examinations.                 Stroke; Risk Factors:Hypertension and Dyslipidemia.  Sonographer:    Therisa Crouch Referring Phys: [id number] [facility] IMPRESSIONS  1. Left ventricular ejection fraction, by estimation, is 65 to 70%. The left ventricle has normal function. The left ventricle has no regional wall motion abnormalities. Left ventricular diastolic parameters are consistent with Grade I diastolic dysfunction (impaired relaxation).  2. Right ventricular systolic function  is normal. The right ventricular size is normal. Tricuspid regurgitation signal is inadequate for assessing PA pressure.  3. The mitral valve is grossly normal. Trivial mitral valve regurgitation. No evidence of mitral stenosis.  4. The aortic valve is tricuspid. Aortic valve regurgitation is not visualized. No aortic stenosis is present.  5. The inferior vena cava is normal in size with greater than 50% respiratory variability, suggesting right atrial pressure of 3 mmHg. Conclusion(s)/Recommendation(s): No intracardiac source of embolism detected on this transthoracic study. Consider a transesophageal echocardiogram to exclude cardiac source of embolism if clinically indicated. FINDINGS  Left Ventricle:  Left ventricular ejection fraction, by estimation, is 65 to 70%. The left ventricle has normal function. The left ventricle has no regional wall motion abnormalities. The left ventricular internal cavity size was normal in size. There is  no left ventricular hypertrophy. Left ventricular diastolic parameters are consistent with Grade I diastolic dysfunction (impaired relaxation). Right Ventricle: The right ventricular size is normal. No increase in right ventricular wall thickness. Right ventricular systolic function is normal. Tricuspid regurgitation signal is inadequate for assessing PA pressure. Left Atrium: Left atrial size was normal in size. Right Atrium: Right atrial size was normal in size. Pericardium: There is no evidence of pericardial effusion. Presence of epicardial fat layer. Mitral Valve: The mitral valve is grossly normal. Trivial mitral valve regurgitation. No evidence of mitral valve stenosis. Tricuspid Valve: The tricuspid valve is grossly normal. Tricuspid valve regurgitation is trivial. No evidence of tricuspid stenosis. Aortic Valve: The aortic valve is tricuspid. Aortic valve regurgitation is not visualized. No aortic stenosis is present. Aortic valve mean gradient measures 4.0 mmHg. Aortic valve peak gradient measures 8.0 mmHg. Aortic valve area, by VTI measures 2.19 cm. Pulmonic Valve: The pulmonic valve was grossly normal. Pulmonic valve regurgitation is not visualized. No evidence of pulmonic stenosis. Aorta: The aortic root and ascending aorta are structurally normal, with no evidence of dilitation. Venous: The right lower pulmonary vein is normal. The inferior vena cava is normal in size with greater than 50% respiratory variability, suggesting right atrial pressure of 3 mmHg. IAS/Shunts: The atrial septum is grossly normal.  LEFT VENTRICLE PLAX 2D LVIDd:         3.40 cm   Diastology LVIDs:         2.10 cm   LV e' medial:    5.55 cm/s LV PW:         1.10 cm   LV E/e' medial:  8.1 LV  IVS:        1.00 cm   LV e' lateral:   6.20 cm/s LVOT diam:     1.90 cm   LV E/e' lateral: 7.3 LV SV:         61 LV SV Index:   40 LVOT Area:     2.84 cm  RIGHT VENTRICLE          IVC RV Basal diam:  2.80 cm  IVC diam: 1.30 cm TAPSE (M-mode): 1.8 cm LEFT ATRIUM             Index LA diam:        2.80 cm 1.81 cm/m LA Vol (A2C):   21.7 ml 14.05 ml/m LA Vol (A4C):   43.3 ml 28.03 ml/m LA Biplane Vol: 31.3 ml 20.26 ml/m  AORTIC VALVE AV Area (Vmax):    2.17 cm AV Area (Vmean):   2.21 cm AV Area (VTI):     2.19 cm AV Vmax:  141.00 cm/s AV Vmean:          92.700 cm/s AV VTI:            0.280 m AV Peak Grad:      8.0 mmHg AV Mean Grad:      4.0 mmHg LVOT Vmax:         108.00 cm/s LVOT Vmean:        72.300 cm/s LVOT VTI:          0.216 m LVOT/AV VTI ratio: 0.77  AORTA Ao Root diam: 2.80 cm Ao Asc diam:  2.70 cm MITRAL VALVE MV Area (PHT): 4.83 cm    SHUNTS MV Decel Time: 157 msec    Systemic VTI:  0.22 m MV E velocity: 45.10 cm/s  Systemic Diam: 1.90 cm MV A velocity: 79.10 cm/s MV E/A ratio:  0.57 Darryle Decent MD Electronically signed by Darryle Decent MD Signature Date/Time: 12/02/2023/3:14:32 PM    Final    EEG adult Result Date: 12/02/2023 Shelton Arlin KIDD, MD     12/02/2023 12:38 PM Patient Name: [name] [name] MRN: [id number] Epilepsy Attending: Arlin KIDD Shelton Referring Physician/Provider: Khaliqdina, Salman, MD Date: 12/02/2023 Duration: 22.40 seconds Patient history: 83 y.o. female with hx of dementia, GERD, hypertension, CAD, fibromyalgia, basilar tip aneurysm s/p embolization in 2021 who is brought in with about a week and a half history of confusion and difficulty with ambulation. EEG to evaluate for seizure. Level of alertness: Awake AEDs during EEG study: None Technical aspects: This EEG study was done with scalp electrodes positioned according to the 10-20 International system of electrode placement. Electrical activity was reviewed with band pass filter of 1-70Hz , sensitivity of 7 uV/mm,  display speed of 62mm/sec with a 60Hz  notched filter applied as appropriate. EEG data were recorded continuously and digitally stored.  Video monitoring was available and reviewed as appropriate. Description: The posterior dominant rhythm consists of 8 Hz activity of moderate voltage (25-35 uV) seen predominantly in posterior head regions, symmetric and reactive to eye opening and eye closing. Hyperventilation and photic stimulation were not performed.   IMPRESSION: This study is within normal limits. No seizures or epileptiform discharges were seen throughout the recording. A normal interictal EEG does not exclude the diagnosis of epilepsy. Arlin KIDD Shelton   CT ANGIO HEAD NECK W WO CM Result Date: 12/02/2023 EXAM: CTA Head and Neck with Intravenous Contrast. CT Head without Contrast. CLINICAL HISTORY: Neuro deficit, acute, stroke suspected; Stroke, hemorrhagic; follow up non contrast CT Head and a CT Angio of head and neck. TECHNIQUE: Axial CTA images of the head and neck performed with intravenous contrast. MIP reconstructed images were created and reviewed. Axial computed tomography images of the head/brain performed without intravenous contrast. Note: Per PQRS, the description of internal carotid artery percent stenosis, including 0 percent or normal exam, is based on [place] American Symptomatic Carotid Endarterectomy Trial (NASCET) criteria. Dose reduction technique was used including one or more of the following: automated exposure control, adjustment of mA and kV according to patient size, and/or iterative reconstruction. CONTRAST: 75 mL of iohexol  (OMNIPAQUE ) 350 MG/ML injection. COMPARISON: MRA of the head dated 12/02/2023 and a conventional cerebral arteriogram dated 12/01/2020. FINDINGS: CT HEAD: BRAIN: No acute intraparenchymal hemorrhage. No mass lesion. No CT evidence for acute territorial infarct. No midline shift or extra-axial collection. VENTRICLES: No hydrocephalus. ORBITS: The orbits are  unremarkable. SINUSES AND MASTOIDS: The paranasal sinuses and mastoid air cells are clear. CTA NECK: COMMON CAROTID ARTERIES: No significant stenosis.  No dissection or occlusion. Moderate calcific plaque present within the aortic arch which appears normal in caliber. There is 3-vessel origin of the great arteries. INTERNAL CAROTID ARTERIES: No stenosis by NASCET criteria. No dissection or occlusion. The right internal carotid artery is tortuous. The left internal carotid artery is also mildly tortuous. Mild-to-moderate calcific plaque within the right and left carotid bulbs, but no associated stenosis. VERTEBRAL ARTERIES: No significant stenosis. No dissection or occlusion. The vertebral arteries are codominant and normal in caliber throughout their respective courses. CTA HEAD: ANTERIOR CEREBRAL ARTERIES: No significant stenosis. No occlusion. 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 MIDDLE CEREBRAL ARTERIES: No significant stenosis. No occlusion. No aneurysm. Mild atheromatous irregularity of the M1 segments, but no flow-limiting stenosis. POSTERIOR CEREBRAL ARTERIES: No significant stenosis. No occlusion. No aneurysm. The right posterior cerebral artery is normal in caliber and unremarkable. Moderate calcific atheromatous disease at the junction of the left P1 and P2 segments, causing moderate-to-severe focal stenosis, as demonstrated on the previous MRI. BASILAR ARTERY: No significant stenosis. No occlusion. Status post coiling of the basilar tip. There is calcific atheromatous disease within the distal basilar artery, causing mild-to-moderate stenosis, less than 50%. No apparent residual or recurrent aneurysm. OTHER: There is moderate calcific plaque within the carotid siphons, with no flow-limiting stenosis. Moderate spray artifact from embolization coils within the tip of the basilar artery largely obscuring the anterior communicating  artery. SOFT TISSUES: There appears to be a solid nodule within the right lobe of the thyroid  measuring approximately 11 x 8 mm. According to the American Thyroid  Association guidelines, the finding is consistent with an incidental thyroid  nodule >1 cm and the recommendation is dedicated thyroid  ultrasound and serum TSH measurement. BONES: There is degenerative endolisthesis present at C3-4. There is also reversal of the normal cervical lordosis and diffuse multilevel chronic degenerative disc disease. LIMITATIONS/ARTIFACTS: There is free artifact obscuring and distorting the lower neck. IMPRESSION: 1. No acute intracranial hemorrhage or ischemic change. 2. Status post coiling of the basilar tip with no apparent residual or recurrent aneurysm. 3. 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 4. Moderate-to-severe focal stenosis at the junction of the left P1 and P2 segments, as demonstrated on the previous MRI. 5. Incidental solid nodule within the right lobe of the thyroid  measuring approximately 11 x 8 mm. Recommend dedicated thyroid  ultrasound and serum TSH measurement. Electronically signed by: evalene coho 12/02/2023 10:10 AM EDT RP Workstation: [id number]   MR ANGIO HEAD WO CONTRAST Result Date: 12/02/2023 CLINICAL DATA:  83 year old female with superior right frontal lobe hemorrhage on Head CT yesterday. Neurologic deficit. 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 EXAM: MRA HEAD WITHOUT CONTRAST TECHNIQUE: Angiographic images of the Circle of Willis were acquired using MRA technique without intravenous contrast. COMPARISON:  Brain MRI today reported separately. No prior CT or MR angiogram. FINDINGS: Anterior circulation: Antegrade flow signal in both ICA siphons. Ophthalmic and right posterior communicating artery origins appear normal. Left posterior communicating artery diminutive or absent. Mild  ICA siphon irregularity without stenosis. Patent carotid termini, MCA and ACA origins. Mildly ectatic anterior communicating artery and difficult to exclude a small 2 mm Acomm aneurysm as seen on series 4, image 63. This could alternatively be infundibulum. Tortuous proximal A2 segments. Visible ACA branches otherwise within normal limits. Left MCA M1 bifurcates early without stenosis. Right MCA M1 and bifurcation appear patent without stenosis. Visible  MCA branches are within normal limits. Posterior circulation: Antegrade flow in the posterior circulation. Codominant distal vertebral arteries and vertebrobasilar junction appear patent without stenosis. Patent PICA origins. Patent basilar artery without stenosis. Artifact at the basilar tip from embolization coil pack. Small area of 2 mm flow signal along the inferior surface of the coil pack, series 4, image 64 and series 455, image 9. Adjacent PCA origins remain patent. Right posterior communicating artery is visible. SCA origins are patent. Right PCA branches are within normal limits. There is a short segment moderate to severe stenosis of the left PCA P1/P2 junction (series 454, image 6). With maintained distal left PCA branch flow. Anatomic variants: None significant. Other: Brain MRI today is reported separately. IMPRESSION: 1. 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 2. Difficult to exclude a small 2 mm A-comm aneurysm, but might an infundibulum. 3. No large vessel occlusion. 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 4. Abnormal Brain MRI today reported separately. Electronically Signed   By: VEAR Hurst M.D.   On: 12/02/2023 09:23   MR BRAIN WO CONTRAST Result Date: 12/02/2023 CLINICAL DATA:  83 year old female with superior right frontal lobe hemorrhage on Head CT yesterday. Neurologic deficit. 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 EXAM: MRI HEAD WITHOUT CONTRAST TECHNIQUE: Multiplanar, multiecho pulse sequences of the brain and surrounding structures were obtained without intravenous contrast. COMPARISON:  Head CT yesterday.  Brain MRI 11/27/2022. FINDINGS: Brain: Significant streak artifact through the anterior cranial fossa on CT yesterday from large basilar tip aneurysm coil pack. MRI reveals similar appearing right inferior frontal gyrus and superior frontal gyrus intra-axial hemorrhage, with T1 and T2 signal heterogeneity (series 9, image 45 in the right inferior frontal gyrus. That hematoma is 17 x 26 by 21 mm (AP by transverse by CC, volume estimated at 5 mL) where as the left superior frontal gyrus hematoma visible by CT is 38 x 22 x 31 mm (AP by transverse by CC, volume estimated at 13 mL) and extends to the superior surface of the brain. Comparing across modalities, neither hemorrhage appears significantly changed from the CT yesterday. Mild regional T2 and FLAIR hyperintense edema at both sites. No extra-axial or intraventricular blood is identified. DWI susceptibility at both sites with no other restricted diffusion suggestive of acute ischemia. No significant midline shift or intracranial mass effect, basilar cisterns remain normal. Scattered chronic microhemorrhages in the left parietal and occipital lobes also appear increased from the SWI last year. Curvilinear chronic hemosiderin in the inferior left cerebellum is stable. Elsewhere confluent bilateral cerebral white matter T2 and FLAIR hyperintensity has not significantly changed. Deep gray nuclei T2 heterogeneity appears stable. Negative pituitary and cervicomedullary junction. Vascular: Major intracranial vascular flow voids are preserved. Heterogeneity at the basilar artery tip appears stable from the MRI last year. Skull and upper cervical spine: Stable. Background bone marrow signal is within normal limits. Sinuses/Orbits: Stable, negative. Other:  Mastoids are clear. IMPRESSION: 1. MRI reveals two acute/subacute intra-axial hemorrhages in the right hemisphere: Known superior frontal gyrus hematoma (13 mL stable since CT yesterday), and a smaller right inferior frontal gyrus hematoma (5 mL) which was obscured on CT by basilar tip coil pack artifact. Underlying micro-hemorrhages in the left parietal and occipital lobes appear increased from MRI last year. 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 2. Mild  associated cerebral edema with no significant intracranial mass effect or other complicating features. 3. No other acute intracranial abnormality. Previously treated basilar tip aneurysm. Advanced chronic white matter disease. Electronically Signed   By: VEAR Hurst M.D.   On: 12/02/2023 09:16   CT Head Wo Contrast Addendum Date: 12/01/2023 ADDENDUM REPORT: 12/01/2023 23:57 ADDENDUM: These results were called by telephone at the time of interpretation on 12/01/2023 at 11:56 pm to provider April Palumbo, MD, who verbally acknowledged these results. Electronically Signed   By: Dorethia Molt M.D.   On: 12/01/2023 23:57   Result Date: 12/01/2023 CLINICAL DATA:  Mental status change, unknown cause, dementia EXAM: CT HEAD WITHOUT CONTRAST TECHNIQUE: Contiguous axial images were obtained from the base of the skull through the vertex without intravenous contrast. RADIATION DOSE REDUCTION: This exam was performed according to the departmental dose-optimization program which includes automated exposure control, adjustment of the mA and/or kV according to patient size and/or use of iterative reconstruction technique. COMPARISON:  MRI 11/27/2022 FINDINGS: Brain: Acute intraparenchymal hematoma is seen involving the superior frontal gyrus of the right frontal lobe measuring 3.1 x 3.6 x 1.5 cm in greatest dimension (23/6, 22/2). Small amount of surrounding cytotoxic. Mild mass effect with effacement of the overlying sulci. No  associated subdural or subarachnoid hemorrhage. No intraventricular hemorrhage. No midline shift. Significant streak artifact related to embolization coils in the region of the basilar tip. Mild parenchymal volume loss is commensurate with the patient's age. Extensive periventricular white matter changes are identified, stable since prior examination, possibly related to small vessel ischemia. Ventricular size is normal. Cerebellum is unremarkable. Vascular: No hyperdense vessel or unexpected calcification. Embolization coils noted within the basilar tip. Skull: Normal. Negative for fracture or focal lesion. Sinuses/Orbits: No acute finding. Other: Mastoid air cells and middle ear cavities are clear. IMPRESSION: 1. Acute intraparenchymal hematoma involving the superior frontal gyrus of the right frontal lobe measuring 3.1 x 3.6 x 1.5 cm in greatest dimension. Mild mass effect with effacement of the overlying sulci. No midline shift. 2. Extensive periventricular white matter changes, stable since prior examination, possibly related to small vessel ischemia. 3. Embolization coils in the region of the basilar tip. Electronically Signed: By: Dorethia Molt M.D. On: 12/01/2023 23:50    Assessment/Plan: Diagnosis: Intraparenchymal hemorrhage right frontal lobe, superior frontal gyrus Does the need for close, 24 hr/day medical supervision in concert with the patient's rehab needs make it unreasonable for this patient to be served in a less intensive setting? Yes Co-Morbidities requiring supervision/potential complications:  -Hx of CVA, HTN, HLD, dysphagia, CAD, Fibromyalgia, GERD, depression, dementia Due to bladder management, bowel management, safety, skin/wound care, disease management, medication administration, pain management, and patient education, does the patient require 24 hr/day rehab nursing? Yes Does the patient require coordinated care of a physician, rehab nurse, therapy disciplines of PT/OT to  address physical and functional deficits in the context of the above medical diagnosis(es)? Yes Addressing deficits in the following areas: balance, endurance, locomotion, strength, transferring, bowel/bladder control, bathing, dressing, feeding, grooming, toileting, cognition, speech, language, swallowing, and psychosocial support Can the patient actively participate in an intensive therapy program of at least 3 hrs of therapy per day at least 5 days per week? Yes The potential for patient to make measurable gains while on inpatient rehab is excellent Anticipated functional outcomes upon discharge from inpatient rehab are supervision and min assist  with PT, supervision and min assist with OT, supervision and min assist with SLP. Estimated rehab length of  stay to reach the above functional goals is: 12-16 Anticipated discharge destination: Home Overall Rehab/Functional Prognosis: excellent  POST ACUTE RECOMMENDATIONS: This patient's condition is appropriate for continued rehabilitative care in the following setting: CIR Patient has agreed to participate in recommended program. Yes Note that insurance prior authorization may be required for reimbursement for recommended care.  Comment: I think patient would be a good candidate for CIR.  Would need to confirm disposition/family assistance after discharge.  Rehab coordinator follow-up   MEDICAL RECOMMENDATIONS: Consider PRAFO b/l LE   I have personally performed a face to face diagnostic evaluation of this patient. Additionally, I have examined the patient's medical record including any pertinent labs and radiographic images.    Thanks,  Murray Collier, MD 12/03/2023

## 2023-12-03 NOTE — Progress Notes (Addendum)
 STROKE TEAM PROGRESS NOTE   SUBJECTIVE (INTERIM HISTORY) Today patient is out of bed, sitting in chair with son at bedside.  She has baseline dementia.  During PT/OT today, therapist noted right lower extremity pain concerning for positive Homans' sign; venous Doppler ordered.  On exam, patient continues to exhibit only mild left lower extremity weakness, with mental status at baseline with slight word finding difficulty.  Plan is to transfer patient to floor today. Blood pressure adequately controlled off drips  OBJECTIVE  CBC    Component Value Date/Time   WBC 4.1 12/02/2023 1023   RBC 4.55 12/02/2023 1023   HGB 13.6 12/02/2023 1023   HCT 41.3 12/02/2023 1023   PLT 162 12/02/2023 1023   MCV 90.8 12/02/2023 1023   MCH 29.9 12/02/2023 1023   MCHC 32.9 12/02/2023 1023   RDW 13.4 12/02/2023 1023   LYMPHSABS 1.1 12/01/2023 1741   MONOABS 0.3 12/01/2023 1741   EOSABS 0.1 12/01/2023 1741   BASOSABS 0.0 12/01/2023 1741    BMET    Component Value Date/Time   NA 137 12/02/2023 1023   NA 138 05/01/2018 0000   K 4.0 12/02/2023 1023   CL 109 12/02/2023 1023   CO2 19 (L) 12/02/2023 1023   GLUCOSE 78 12/02/2023 1023   BUN 12 12/02/2023 1023   BUN 28 (A) 05/01/2018 0000   CREATININE 1.03 (H) 12/02/2023 1023   CREATININE 0.92 11/02/2015 1538   CALCIUM  8.9 12/02/2023 1023   CALCIUM  9.6 08/10/2012 0945   GFRNONAA 54 (L) 12/02/2023 1023   GFRNONAA 62 11/02/2015 1538    IMAGING past 24 hours ECHOCARDIOGRAM COMPLETE Result Date: 12/02/2023    ECHOCARDIOGRAM REPORT   Patient Name:   [name] [name] Date of Exam: 12/02/2023 Medical Rec #:  [id number]    Height:       63.0 in Accession #:    [id number]   Weight:       117.9 lb Date of Birth:  [birth year]    BSA:          1.545 m Patient Age:    82 years     BP:           135/71 mmHg Patient Gender: F            HR:           81 bpm. Exam Location:  Inpatient Procedure: 2D Echo, Cardiac Doppler and Color Doppler (Both Spectral and Color             Flow Doppler were utilized during procedure). Indications:    Stroke  History:        Patient has no prior history of Echocardiogram examinations.                 Stroke; Risk Factors:Hypertension and Dyslipidemia.  Sonographer:    Therisa Crouch Referring Phys: [id number] [facility] IMPRESSIONS  1. Left ventricular ejection fraction, by estimation, is 65 to 70%. The left ventricle has normal function. The left ventricle has no regional wall motion abnormalities. Left ventricular diastolic parameters are consistent with Grade I diastolic dysfunction (impaired relaxation).  2. Right ventricular systolic function is normal. The right ventricular size is normal. Tricuspid regurgitation signal is inadequate for assessing PA pressure.  3. The mitral valve is grossly normal. Trivial mitral valve regurgitation. No evidence of mitral stenosis.  4. The aortic valve is tricuspid. Aortic valve regurgitation is not visualized. No aortic stenosis is present.  5. The inferior vena cava is normal  in size with greater than 50% respiratory variability, suggesting right atrial pressure of 3 mmHg. Conclusion(s)/Recommendation(s): No intracardiac source of embolism detected on this transthoracic study. Consider a transesophageal echocardiogram to exclude cardiac source of embolism if clinically indicated. FINDINGS  Left Ventricle: Left ventricular ejection fraction, by estimation, is 65 to 70%. The left ventricle has normal function. The left ventricle has no regional wall motion abnormalities. The left ventricular internal cavity size was normal in size. There is  no left ventricular hypertrophy. Left ventricular diastolic parameters are consistent with Grade I diastolic dysfunction (impaired relaxation). Right Ventricle: The right ventricular size is normal. No increase in right ventricular wall thickness. Right ventricular systolic function is normal. Tricuspid regurgitation signal is inadequate for assessing PA pressure. Left  Atrium: Left atrial size was normal in size. Right Atrium: Right atrial size was normal in size. Pericardium: There is no evidence of pericardial effusion. Presence of epicardial fat layer. Mitral Valve: The mitral valve is grossly normal. Trivial mitral valve regurgitation. No evidence of mitral valve stenosis. Tricuspid Valve: The tricuspid valve is grossly normal. Tricuspid valve regurgitation is trivial. No evidence of tricuspid stenosis. Aortic Valve: The aortic valve is tricuspid. Aortic valve regurgitation is not visualized. No aortic stenosis is present. Aortic valve mean gradient measures 4.0 mmHg. Aortic valve peak gradient measures 8.0 mmHg. Aortic valve area, by VTI measures 2.19 cm. Pulmonic Valve: The pulmonic valve was grossly normal. Pulmonic valve regurgitation is not visualized. No evidence of pulmonic stenosis. Aorta: The aortic root and ascending aorta are structurally normal, with no evidence of dilitation. Venous: The right lower pulmonary vein is normal. The inferior vena cava is normal in size with greater than 50% respiratory variability, suggesting right atrial pressure of 3 mmHg. IAS/Shunts: The atrial septum is grossly normal.  LEFT VENTRICLE PLAX 2D LVIDd:         3.40 cm   Diastology LVIDs:         2.10 cm   LV e' medial:    5.55 cm/s LV PW:         1.10 cm   LV E/e' medial:  8.1 LV IVS:        1.00 cm   LV e' lateral:   6.20 cm/s LVOT diam:     1.90 cm   LV E/e' lateral: 7.3 LV SV:         61 LV SV Index:   40 LVOT Area:     2.84 cm  RIGHT VENTRICLE          IVC RV Basal diam:  2.80 cm  IVC diam: 1.30 cm TAPSE (M-mode): 1.8 cm LEFT ATRIUM             Index LA diam:        2.80 cm 1.81 cm/m LA Vol (A2C):   21.7 ml 14.05 ml/m LA Vol (A4C):   43.3 ml 28.03 ml/m LA Biplane Vol: 31.3 ml 20.26 ml/m  AORTIC VALVE AV Area (Vmax):    2.17 cm AV Area (Vmean):   2.21 cm AV Area (VTI):     2.19 cm AV Vmax:           141.00 cm/s AV Vmean:          92.700 cm/s AV VTI:            0.280 m AV  Peak Grad:      8.0 mmHg AV Mean Grad:      4.0 mmHg LVOT Vmax:  108.00 cm/s LVOT Vmean:        72.300 cm/s LVOT VTI:          0.216 m LVOT/AV VTI ratio: 0.77  AORTA Ao Root diam: 2.80 cm Ao Asc diam:  2.70 cm MITRAL VALVE MV Area (PHT): 4.83 cm    SHUNTS MV Decel Time: 157 msec    Systemic VTI:  0.22 m MV E velocity: 45.10 cm/s  Systemic Diam: 1.90 cm MV A velocity: 79.10 cm/s MV E/A ratio:  0.57 Darryle Decent MD Electronically signed by Darryle Decent MD Signature Date/Time: 12/02/2023/3:14:32 PM    Final     Vitals:   12/03/23 1145 12/03/23 1200 12/03/23 1215 12/03/23 1230  BP:  107/78    Pulse: 76 79 75 75  Resp: 17 (!) 29 (!) 23 (!) 21  Temp:      TempSrc:      SpO2: 100% 100% 90% 100%  Weight:      Height:         PHYSICAL EXAM General:  Alert, well-nourished, well-developed patient in no acute distress CV: Regular rate and rhythm on monitor Respiratory:  Regular, unlabored respirations on room air     NEURO:  Mental Status: AA&Ox3, history given by daughter and son Speech/Language: Fluent with intact comprehension, mild word finding difficulty suggestive of anomic aphasia   Cranial Nerves:  II: PERRL. Visual fields full.  III, IV, VI: EOMI. Eyelids elevate symmetrically.  V: Sensation is intact to light touch and symmetrical to face.  VII: Face is symmetrical resting and smiling VIII: hearing intact to voice. IX, X: Palate elevates symmetrically. Phonation is normal.  KP:Dynloizm shrug 5/5. XII: tongue is midline without fasciculations. Motor: LLE weakness 3/5, all other extremities 5/5 Tone: is normal and bulk is normal Sensation- Intact to light touch bilaterally. Extinction absent to light touch to DSS.   Coordination: FTN intact bilaterally, HKS: no ataxia in BLE.No drift.  Gait- deferred  Most Recent NIH  Dizziness Present: No (08/13 1200) Headache Present: No (08/13 1200) Interval: Other (Comment) (08/13 1200) Level of Consciousness (1a.)   : Alert,  keenly responsive (08/13 1200) LOC Questions (1b. )   : Answers one question correctly (08/13 1200) LOC Commands (1c. )   : Performs both tasks correctly (08/13 1200) Best Gaze (2. )  : Normal (08/13 1200) Visual (3. )  : No visual loss (08/13 1200) Facial Palsy (4. )    : Normal symmetrical movements (08/13 1200) Motor Arm, Left (5a. )   : No drift (08/13 1200) Motor Arm, Right (5b. ) : No drift (08/13 1200) Motor Leg, Left (6a. )  : No drift (08/13 1200) Motor Leg, Right (6b. ) : No drift (08/13 1200) Limb Ataxia (7. ): Absent (08/13 1200) Sensory (8. )  : Normal, no sensory loss (08/13 1200) Best Language (9. )  : Mild-to-moderate aphasia (08/13 1200) Dysarthria (10. ): Normal (08/13 1200) Extinction/Inattention (11.)   : No Abnormality (08/13 1200) Complete NIHSS TOTAL: 2 (08/13 1200)    ASSESSMENT/PLAN [name] [name] is a 83 y.o. female with history of dementia, GERD, hypertension, CAD, fibromyalgia, basilar tip aneurysm s/p plantation in 2021 who was admitted for a week and a half history of confusion and difficulty with ambulation.  CT head revealed a right frontal hemorrhage.  Neurological exam was notable only for weakness isolated to the left lower extremity.  Differential considerations include small vessel disease versus cerebral amyloid angiopathy.  We will continue current blood pressure  control with systolic goal below 160 when mobilizing out of bed.  Patient will be transferred to floor today with stroke as consult.    Intraparenchymal Hemorrhage:  right frontal lobe, superior frontal gyrus Etiology: Hypertensive versus amyloid angiopathy Code Stroke CT head acute intraparenchymal hematoma involving the superior frontal gyrus of the right frontal lobe measuring 3.1 x 3.6 x 1.5 cm.  Mild mass effect with effacement of the overlying sulci.  No midline shift.  Extensive periventricular white matter changes, stable since prior examination.  Embolization coils in the region of  the basilar tip. CTA head & neck no acute intracranial hemorrhage or ischemic change.  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  Moderate to severe focal stenosis at the junction of the left P1 and P2 segment, associated on the previous MRI.  Incidental nodule within the right lobe of thyroid  11 x 8 mm. MRI 2 acute/subacute intra-axial hemorrhages in the right hemisphere-known superior frontal gyrus hematoma, 13 mm stable since CT yesterday, and a smaller right inferior frontal gyrus hematoma, 5 mm, which is obscured on CT but basilar tip coil pack artifact.  Underlying microhemorrhages in the left parietal frontal lobes appear increased from MRI last year. MRA treated basilar tip aneurysm with a small 2 mm area of possible pleural compaction along the base of the aneurysm.  No LVO.  But positive for moderate to severe stenosis of the left PCA P1/P2 junction. 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 Yes; Fluid consistency: Thin  [] Expand by Default      Advance diet as tolerated   Other Stroke Risk Factors Coronary artery disease   Other Active Problems Fibromyalgia GERD  Hospital day # 1  Alan Maiden, MD PGY-1  I have personally obtained history,examined this patient, reviewed notes, independently viewed imaging studies, participated  in medical decision making and plan of care.ROS completed by me personally and pertinent positives fully documented  I have made any additions or clarifications directly to the above note. Agree with note above.  Patient neurological exam is stable.  Blood pressure adequately controlled off drips.  Plan to mobilize out of bed and transfer to neurology stepdown bed.  Will ask medical hospitalist team to resume care.  Discussed with Dr. Lanis neurosurgeon who agrees the patient's right convexity frontal hemorrhage is unrelated to the small ACOM aneurysm which is unchanged and can be managed conservatively.  Long discussion with patient and son at the bedside and answered questions.   I personally spent a total of 50 minutes in the care of the patient today including getting/reviewing separately obtained history, performing a medically appropriate exam/evaluation, counseling and educating, placing orders, referring and communicating with other health care professionals, documenting clinical information in the EHR, independently interpreting results, and coordinating care.        Eather Popp, MD Medical Director [facility] Pager:  [phone] 12/03/2023 3:00 PM   To contact Stroke Continuity provider, please refer to [email or web address]. After hours, contact General Neurology

## 2023-12-03 NOTE — Progress Notes (Signed)
  NEUROSURGERY PROGRESS NOTE   Admission history reviewed. Pt known to me from previous elective stent-supported coil embolization of basilar apex aneurysm. She is now admitted with small right frontal convexity IPH and does have a known small unruptured Acom aneurysm.   EXAM:  BP 125/85   Pulse (!) 105   Temp 99.1 F (37.3 C) (Oral)   Resp 19   Ht 5' 3 (1.6 m)   Wt 53.5 kg   SpO2 98%   BMI 20.89 kg/m   Awake, alert Speech fluent, appropriate  CN grossly intact  5/5 BUE/BLE   IMAGING: CTA personally reviewed and demonstrates an IPH within the anterior right frontal convexity. There is stable appearance of the known small Acom region aneurysm and specifically the IPH is completely distinct in location from the aneurysm.  IMPRESSION:  83 y.o. female admitted with IPH of unclear etiology, possibly amyloid related but seems unrelated to known small Acom aneurysm.  PLAN: - Cont current supportive care per neurology - I can cont to follow her for the previously treated basilar aneurysm and the small Acom aneurysm in the outpatient setting with repeat scan.   Gerldine Maizes, MD [facility]

## 2023-12-03 NOTE — Plan of Care (Signed)
  Problem: Education: Goal: Knowledge of disease or condition will improve Outcome: Progressing   Problem: Intracerebral Hemorrhage Tissue Perfusion: Goal: Complications of Intracerebral Hemorrhage will be minimized Outcome: Progressing   Problem: Coping: Goal: Will verbalize positive feelings about self Outcome: Progressing Goal: Will identify appropriate support needs Outcome: Progressing   Problem: Health Behavior/Discharge Planning: Goal: Ability to manage health-related needs will improve Outcome: Progressing Goal: Goals will be collaboratively established with patient/family Outcome: Progressing   Problem: Self-Care: Goal: Ability to participate in self-care as condition permits will improve Outcome: Progressing Goal: Verbalization of feelings and concerns over difficulty with self-care will improve Outcome: Progressing Goal: Ability to communicate needs accurately will improve Outcome: Progressing   Problem: Nutrition: Goal: Risk of aspiration will decrease Outcome: Progressing Goal: Dietary intake will improve Outcome: Progressing   Problem: Education: Goal: Knowledge of General Education information will improve Description: Including pain rating scale, medication(s)/side effects and non-pharmacologic comfort measures Outcome: Progressing   Problem: Health Behavior/Discharge Planning: Goal: Ability to manage health-related needs will improve Outcome: Progressing   Problem: Clinical Measurements: Goal: Ability to maintain clinical measurements within normal limits will improve Outcome: Progressing   Problem: Activity: Goal: Risk for activity intolerance will decrease Outcome: Progressing   Problem: Coping: Goal: Level of anxiety will decrease Outcome: Progressing   Problem: Safety: Goal: Ability to remain free from injury will improve Outcome: Progressing   Problem: Skin Integrity: Goal: Risk for impaired skin integrity will decrease Outcome:  Progressing

## 2023-12-03 NOTE — Progress Notes (Signed)
 Physical Therapy Treatment Patient Details Name: [name] [name] MRN: [id number] DOB: [birth year] Today's Date: 12/03/2023   History of Present Illness Patient is an 83 y/o female admitted 12/02/23 with one and half weeks of confusion and difficulty ambulating.  Daughter brought her to the ED and pt found to have R frontal hemorrhage.  PMH positive for CAD, CKD, GERD, arthritis, HTN, fibromyalgia, interstitial cystitis.    PT Comments  Patient progressing to hallway ambulation and a little more automatic with movements though still needing multimodal and repeated cues for completing tasks.  She stood to perform toilet hygiene and with A and cues pulled up her mesh briefs.  She will continue to benefit from skilled PT in the acute setting.  Seen with OT today to be able to progress to ambulation.  Still feel she will benefit from inpatient rehab prior to d/c home.     If plan is discharge home, recommend the following: A lot of help with bathing/dressing/bathroom;A lot of help with walking and/or transfers;Assistance with cooking/housework;Supervision due to cognitive status   Can travel by private vehicle        Equipment Recommendations  Other (comment) (TBA)    Recommendations for Other Services       Precautions / Restrictions Precautions Precautions: Fall Recall of Precautions/Restrictions: Impaired Precaution/Restrictions Comments: SBP<160     Mobility  Bed Mobility Overal bed mobility: Needs Assistance Bed Mobility: Supine to Sit     Supine to sit: Mod assist     General bed mobility comments: multimodal cues to initiate, assist for LEs over EOB and hips to EOB    Transfers Overall transfer level: Needs assistance Equipment used: Rolling walker (2 wheels) Transfers: Sit to/from Stand, Bed to chair/wheelchair/BSC Sit to Stand: +2 physical assistance, Min assist, Mod assist   Step pivot transfers: Mod assist, +2 physical assistance       General transfer comment:  multimodal cues for hand placement and feet blocked, assist to rise and steady, pt is posterior bias seemingly without awareness, requires steadying assist and guidance of walker for bed to [facility]    Ambulation/Gait Ambulation/Gait assistance: Min assist, Mod assist, +2 safety/equipment Gait Distance (Feet): 75 Feet Assistive device: Rolling walker (2 wheels) Gait Pattern/deviations: Decreased stride length, Shuffle, Trunk flexed       General Gait Details: assist for walker management, assist for balance esp initially with some posterior bias, able to relax support during session though still helping to manage RA (which as bariwalker was wide for her); OT followed with chair   Stairs             Wheelchair Mobility     Tilt Bed    Modified Rankin (Stroke Patients Only) Modified Rankin (Stroke Patients Only) Pre-Morbid Rankin Score: Slight disability Modified Rankin: Moderately severe disability     Balance Overall balance assessment: Needs assistance   Sitting balance-Leahy Scale: Fair     Standing balance support: Bilateral upper extremity supported Standing balance-Leahy Scale: Poor Standing balance comment: some posterior bias                            Communication Communication Communication: No apparent difficulties  Cognition Arousal: Alert Behavior During Therapy: WFL for tasks assessed/performed   PT - Cognitive impairments: Attention, Problem solving, Sequencing, Safety/Judgement                         Following commands: Impaired Following  commands impaired: Follows one step commands inconsistently, Follows one step commands with increased time    Cueing Cueing Techniques: Verbal cues, Gestural cues, Tactile cues  Exercises      General Comments General comments (skin integrity, edema, etc.): VSS and within parameters with mobility      Pertinent Vitals/Pain Pain Assessment Pain Assessment: Faces Faces Pain Scale:  Hurts little more Pain Location: B calves R>L Pain Descriptors / Indicators: Discomfort, Grimacing, Guarding Pain Intervention(s): Monitored during session    Home Living Family/patient expects to be discharged to:: Private residence Living Arrangements: Alone Available Help at Discharge: Family;Available PRN/intermittently Type of Home: House Home Access: Stairs to enter   Entrance Stairs-Number of Steps: 1   Home Layout: One level Home Equipment: Agricultural consultant (2 wheels);Cane - single point Additional Comments: daughter recently got her RW due to imbalance    Prior Function            PT Goals (current goals can now be found in the care plan section) Progress towards PT goals: Progressing toward goals    Frequency    Min 3X/week      PT Plan      Co-evaluation PT/OT/SLP Co-Evaluation/Treatment: Yes Reason for Co-Treatment: For patient/therapist safety;To address functional/ADL transfers PT goals addressed during session: Mobility/safety with mobility;Balance;Proper use of DME OT goals addressed during session: ADL's and self-care      AM-PAC PT 6 Clicks Mobility   Outcome Measure  Help needed turning from your back to your side while in a flat bed without using bedrails?: A Lot Help needed moving from lying on your back to sitting on the side of a flat bed without using bedrails?: A Lot Help needed moving to and from a bed to a chair (including a wheelchair)?: Total Help needed standing up from a chair using your arms (e.g., wheelchair or bedside chair)?: A Lot Help needed to walk in hospital room?: A Lot Help needed climbing 3-5 steps with a railing? : Total 6 Click Score: 10    End of Session Equipment Utilized During Treatment: Gait belt Activity Tolerance: Patient tolerated treatment well Patient left: in chair;with call bell/phone within reach Nurse Communication: Mobility status PT Visit Diagnosis: Other abnormalities of gait and mobility  (R26.89);Other symptoms and signs involving the nervous system (R29.898);Apraxia (R48.2)     Time: 8973-8940 PT Time Calculation (min) (ACUTE ONLY): 33 min  Charges:    $Gait Training: 8-22 mins PT General Charges $$ ACUTE PT VISIT: 1 Visit                     Micheline [name], PT Acute Rehabilitation Services Office:[phone] 12/03/2023    [name] [name] 12/03/2023, 1:11 PM

## 2023-12-03 NOTE — Progress Notes (Signed)
 SLP Cancellation Note  Patient Details Name: [name] [name] MRN: [id number] DOB: [birth year]   Cancelled treatment:       Reason Eval/Treat Not Completed: Other (comment) (patient on bedside commode with RN assisting. SLP will follow up when able.)  Norleen IVAR Blase, MA, CCC-SLP Speech Therapy

## 2023-12-03 NOTE — Evaluation (Signed)
 Occupational Therapy Evaluation Patient Details Name: [name] [name] MRN: [id number] DOB: [birth year] Today's Date: 12/03/2023   History of Present Illness   Patient is an 83 y/o female admitted 12/02/23 with one and half weeks of confusion and difficulty ambulating.  Daughter brought her to the ED and pt found to have R frontal hemorrhage.  PMH positive for CAD, CKD, GERD, arthritis, HTN, fibromyalgia, interstitial cystitis.     Clinical Impressions Pt was living alone with daily visits by her family. She recently began using a RW. She is typically independent in self care and her daughter assists with all IADLs. Pt reports having stopped driving. Pt presents with impaired cognition and motor planning, generalized weakness and decreased standing balance with posterior bias upon standing. She needs +2 min to mod assist for mobility and set up to total assist for ADLs. Patient will benefit from intensive inpatient follow-up therapy, >3 hours/day.     If plan is discharge home, recommend the following:   Two people to help with walking and/or transfers;Two people to help with bathing/dressing/bathroom;Assistance with cooking/housework;Direct supervision/assist for medications management;Direct supervision/assist for financial management;Assist for transportation;Help with stairs or ramp for entrance     Functional Status Assessment   Patient has had a recent decline in their functional status and/or demonstrates limited ability to make significant improvements in function in a reasonable and predictable amount of time     Equipment Recommendations   Other (comment) (defer to next venue)     Recommendations for Other Services         Precautions/Restrictions   Precautions Precautions: Fall Recall of Precautions/Restrictions: Impaired Precaution/Restrictions Comments: SBP<160 Restrictions Weight Bearing Restrictions Per Provider Order: No     Mobility Bed Mobility Overal  bed mobility: Needs Assistance Bed Mobility: Supine to Sit     Supine to sit: Mod assist     General bed mobility comments: multimodal cues to initiate, assist for LEs over EOB and hips to EOB    Transfers Overall transfer level: Needs assistance Equipment used: Rolling walker (2 wheels) Transfers: Sit to/from Stand, Bed to chair/wheelchair/BSC Sit to Stand: +2 physical assistance, Min assist, Mod assist     Step pivot transfers: Mod assist, +2 physical assistance     General transfer comment: multimodal cues for hand placement and feet blocked, assist to rise and steady, pt is posterior bias seemingly without awareness, requires steadying assist and guidance of walker for bed to [facility]      Balance Overall balance assessment: Needs assistance   Sitting balance-Leahy Scale: Fair     Standing balance support: Bilateral upper extremity supported Standing balance-Leahy Scale: Poor                             ADL either performed or assessed with clinical judgement   ADL Overall ADL's : Needs assistance/impaired Eating/Feeding: Set up;Sitting   Grooming: Brushing hair;Moderate assistance;Sitting Grooming Details (indicate cue type and reason): decreased thoroughness Upper Body Bathing: Maximal assistance;Sitting   Lower Body Bathing: Total assistance;+2 for physical assistance;Sit to/from stand   Upper Body Dressing : Maximal assistance;Sitting   Lower Body Dressing: Total assistance;Bed level   Toilet Transfer: +2 for physical assistance;Stand-pivot;Minimal assistance;Rolling walker (2 wheels);BSC/3in1   Toileting- Clothing Manipulation and Hygiene: Maximal assistance;+2 for physical assistance;Sit to/from stand Toileting - Clothing Manipulation Details (indicate cue type and reason): pt able to complete pericare with assist for standing balance and clothing management     Functional  mobility during ADLs: Moderate assistance;+2 for safety/equipment;Rolling  walker (2 wheels)       Vision Baseline Vision/History: 1 Wears glasses Ability to See in Adequate Light: 0 Adequate Patient Visual Report: No change from baseline Additional Comments: wears reading glasses     Perception Perception: Not tested       Praxis Praxis: Impaired Praxis Impairment Details: Initiation, Ideomotor, Organization     Pertinent Vitals/Pain Pain Assessment Pain Assessment: Faces Faces Pain Scale: Hurts little more Pain Location: B calves R>L Pain Descriptors / Indicators: Discomfort, Grimacing, Guarding Pain Intervention(s): Monitored during session, Repositioned     Extremity/Trunk Assessment Upper Extremity Assessment Upper Extremity Assessment: Right hand dominant;Generalized weakness   Lower Extremity Assessment Lower Extremity Assessment: Defer to PT evaluation   Cervical / Trunk Assessment Cervical / Trunk Assessment: Other exceptions;Kyphotic (scoliosis)   Communication Communication Communication: No apparent difficulties   Cognition Arousal: Alert Behavior During Therapy: WFL for tasks assessed/performed Cognition: Cognition impaired   Orientation impairments: Situation, Time Awareness: Intellectual awareness impaired, Online awareness impaired Memory impairment (select all impairments): Short-term memory, Working [employer] fast streamer, Engineer, structural memory Attention impairment (select first level of impairment): Sustained attention Executive functioning impairment (select all impairments): Initiation, Sequencing, Reasoning, Problem solving OT - Cognition Comments: pt also with history of dementia                 Following commands: Impaired Following commands impaired: Follows one step commands inconsistently, Follows one step commands with increased time     Cueing  General Comments   Cueing Techniques: Verbal cues;Tactile cues;Gestural cues      Exercises     Shoulder Instructions      Home Living Family/patient  expects to be discharged to:: Private residence Living Arrangements: Alone Available Help at Discharge: Family;Available PRN/intermittently Type of Home: House Home Access: Stairs to enter Entrance Stairs-Number of Steps: 1   Home Layout: One level     Bathroom Shower/Tub: Walk-in shower         Home Equipment: Agricultural consultant (2 wheels);Cane - single point   Additional Comments: daughter recently got her RW due to imbalance      Prior Functioning/Environment Prior Level of Function : Needs assist             Mobility Comments: assisted for meal prep and housekeeping, pt does not drive ADLs Comments: independent in basic self care    OT Problem List: Decreased strength;Impaired balance (sitting and/or standing);Decreased coordination;Decreased cognition;Decreased safety awareness;Decreased knowledge of use of DME or AE;Pain;Decreased knowledge of precautions   OT Treatment/Interventions: Self-care/ADL training;DME and/or AE instruction;Therapeutic activities;Cognitive remediation/compensation;Patient/family education;Balance training      OT Goals(Current goals can be found in the care plan section)   Acute Rehab OT Goals OT Goal Formulation: With patient Time For Goal Achievement: 12/17/23 Potential to Achieve Goals: Fair ADL Goals Pt Will Perform Grooming: standing;with min assist Pt Will Perform Upper Body Dressing: with min assist;sitting Pt Will Transfer to Toilet: with min assist;ambulating;bedside commode Pt Will Perform Toileting - Clothing Manipulation and hygiene: with min assist;sit to/from stand Additional ADL Goal #1: Pt will complete bed mobility with min assist in preparation for ADLs. Additional ADL Goal #2: Pt will follow one step verbal commands with 50% accuracy.   OT Frequency:  Min 2X/week    Co-evaluation PT/OT/SLP Co-Evaluation/Treatment: Yes Reason for Co-Treatment: For patient/therapist safety   OT goals addressed during session:  ADL's and self-care      AM-PAC OT 6 Clicks Daily Activity  Outcome Measure Help from another person eating meals?: A Little Help from another person taking care of personal grooming?: A Lot Help from another person toileting, which includes using toliet, bedpan, or urinal?: A Lot Help from another person bathing (including washing, rinsing, drying)?: A Lot Help from another person to put on and taking off regular upper body clothing?: A Lot Help from another person to put on and taking off regular lower body clothing?: Total 6 Click Score: 12   End of Session Equipment Utilized During Treatment: Rolling walker (2 wheels);Gait belt Nurse Communication: Other (comment) (MD aware of pain)  Activity Tolerance: Patient tolerated treatment well Patient left: in chair;with call bell/phone within reach;with family/visitor present;Other (comment) (MD in room)  OT Visit Diagnosis: Unsteadiness on feet (R26.81);Other abnormalities of gait and mobility (R26.89);Muscle weakness (generalized) (M62.81);Other symptoms and signs involving cognitive function;Pain                Time: 1026-1059 OT Time Calculation (min): 33 min Charges:  OT General Charges $OT Visit: 1 Visit OT Evaluation $OT Eval Moderate Complexity: 1 Mod  Mliss HERO, OTR/L Acute Rehabilitation Services Office: [phone]   Kennth Mliss Helling 12/03/2023, 12:50 PM

## 2023-12-04 DIAGNOSIS — I619 Nontraumatic intracerebral hemorrhage, unspecified: Principal | ICD-10-CM

## 2023-12-04 DIAGNOSIS — I10 Essential (primary) hypertension: Secondary | ICD-10-CM | POA: Diagnosis not present

## 2023-12-04 DIAGNOSIS — E859 Amyloidosis, unspecified: Secondary | ICD-10-CM | POA: Diagnosis not present

## 2023-12-04 DIAGNOSIS — I6389 Other cerebral infarction: Secondary | ICD-10-CM | POA: Diagnosis not present

## 2023-12-04 DIAGNOSIS — R29701 NIHSS score 1: Secondary | ICD-10-CM

## 2023-12-04 DIAGNOSIS — I629 Nontraumatic intracranial hemorrhage, unspecified: Secondary | ICD-10-CM | POA: Diagnosis not present

## 2023-12-04 DIAGNOSIS — I611 Nontraumatic intracerebral hemorrhage in hemisphere, cortical: Secondary | ICD-10-CM | POA: Diagnosis not present

## 2023-12-04 MED ORDER — AMLODIPINE BESYLATE 5 MG PO TABS
10.0000 mg | ORAL_TABLET | Freq: Every day | ORAL | Status: DC
  Administered 2023-12-04 – 2023-12-05 (×2): 10 mg via ORAL
  Filled 2023-12-04 (×2): qty 2

## 2023-12-04 MED ORDER — ROSUVASTATIN CALCIUM 20 MG PO TABS
20.0000 mg | ORAL_TABLET | Freq: Every day | ORAL | Status: DC
  Administered 2023-12-04 – 2023-12-05 (×2): 20 mg via ORAL
  Filled 2023-12-04 (×2): qty 1

## 2023-12-04 MED ORDER — METOPROLOL SUCCINATE ER 25 MG PO TB24: 12.5000 mg | ORAL_TABLET | Freq: Every day | ORAL | Status: DC

## 2023-12-04 MED ORDER — ACETAMINOPHEN 325 MG PO TABS: 650.0000 mg | ORAL_TABLET | ORAL | Status: AC | PRN

## 2023-12-04 MED ORDER — AMLODIPINE BESYLATE 10 MG PO TABS: 10.0000 mg | ORAL_TABLET | Freq: Every day | ORAL | Status: DC

## 2023-12-04 NOTE — Progress Notes (Signed)
 PROGRESS NOTE  [name] [name] FMW:[id number] DOB: [birth year]   PCP: Charlett Apolinar POUR, MD  Patient is from: Home.  Lives alone.  Independently ambulates at baseline but recently started using walker.  DOA: 12/01/2023 LOS: 2  Chief complaints Chief Complaint  Patient presents with   Aphasia     Brief Narrative / Interim history: 83 year old F with PMH of dementia, CAD, HTN, basilar tip aneurysm s/p coil embolization in 2021, GERD, fibromyalgia and osteoarthritis presenting with confusion and difficulty ambulating for about a week and up and admitted to neuro ICU due to right frontal IPH.  Patient was not on anticoagulation or antiplatelet.  Neurological exam was notable for weakness isolated to the left lower extremity.  Neurosurgery consulted and did not feel IPH is related to patient's aneurysm, and recommended conservative management.  Therapy recommended CIR. Patient transferred to hospitalist service on 12/04/2023.  Medically stable for discharge  Subjective: Seen and examined earlier this morning.  No major events overnight or this morning.  Sleepy but wakes to voice.  She is oriented to self and her daughter.  Follows commands.  No complaints.  Objective: Vitals:   12/03/23 2354 12/04/23 0422 12/04/23 0814 12/04/23 1135  BP: 133/63 (!) 165/70 (!) 152/74 114/60  Pulse: 67 64 (!) 59 63  Resp: 12 13 16 18   Temp: 97.7 F (36.5 C) (!) 97.5 F (36.4 C) 98.1 F (36.7 C) 98 F (36.7 C)  TempSrc: Oral Oral Oral Oral  SpO2: 98% 98% 98% 100%  Weight:      Height:        Examination:  GENERAL: No apparent distress.  Nontoxic. HEENT: MMM.  Vision and hearing grossly intact.  NECK: Supple.  No apparent JVD.  RESP:  No IWOB.  Fair aeration bilaterally. CVS:  RRR. Heart sounds normal.  ABD/GI/GU: BS+. Abd soft, NTND.  MSK/EXT:  Moves extremities. No apparent deformity. No edema.  SKIN: no apparent skin lesion or wound NEURO: Sleepy but wakes to voice.  Oriented to self and daughter.   Follows commands. Speech clear. Cranial nerves II-XII intact. Motor intact and symmetric in all extremities.  Light sensation grossly intact in all dermatomes. Patellar reflex symmetric.  No pronator drift.   PSYCH: Calm. Normal affect.   Consultants:  Neurosurgery Neurology  Procedures: None  Microbiology summarized: None  Assessment and plan: Intraparenchymal Hemorrhage:  right frontal lobe, superior frontal gyrus Etiology: Hypertensive versus amyloid angiopathy Code Stroke CT head showed acute IPH involving the superior frontal gyrus of the right frontal lobe measuring 3.1 x 3.6 x 1.5 cm.  Mild mass effect with effacement of the overlying sulci.  No midline shift.  Stable extensive periventricular white matter changes.  Embolization coils in the region of the basilar tip. CTA head & neck no acute ICH or ischemic change.  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  Moderate to severe focal stenosis at the junction of the left P1 and P2 segment, associated on the previous MRI.  Incidental nodule within the right lobe of thyroid  11 x 8 mm. MRI showed 2 acute/subacute intra-axial hemorrhages in the right hemisphere-known superior frontal gyrus hematoma, 13 mm stable since CT yesterday, and a smaller right inferior frontal gyrus hematoma, 5 mm, which is obscured on CT but basilar tip coil pack artifact.  Underlying microhemorrhages in the left parietal frontal lobes appear increased from MRI last year. MRA treated basilar tip aneurysm with a small 2 mm area of possible pleural compaction along the base of the  aneurysm.  No LVO.  But positive for moderate to severe stenosis of the left PCA P1/P2 junction. 2D Echo LVEF 60 to 70%, no intracardiac source of embolism detected LDL 108 HgbA1c 5.5 VTE prophylaxis - SCDs No antithrombotic prior to admission and now. Therapy recommended CIR.   Dementia without behavioral disturbance: Lives alone.  Independent for ADLs but family helps with  cooking.  Oriented to self and family.  Follows commands. Continue home memantine  Reorientation and delirium precaution  Hypertension: Off Cleviprex .  SBP from 150-160 earlier this morning.  HR in 50s. Increased amlodipine  to 10 mg daily Added holding parameters to Toprol -XL   Hyperlipidemia: LDL 108.  Goal less than 70. Resume home Crestor  20 mg daily.  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   Other Stroke Risk Factors Coronary artery disease   Fibromyalgia Continue home Cymbalta .  GERD-not taking PPI.  Body mass index is 20.89 kg/m.           DVT prophylaxis:  heparin  injection 5,000 Units Start: 12/03/23 1400  Code Status: Full code Family Communication: Updated patient's daughter at bedside Level of care: Telemetry Medical Status is: Inpatient Remains inpatient appropriate because: Intracranial hemorrhage   Final disposition: CIR   85 minutes with more than 50% spent in reviewing records, counseling patient/family and coordinating care.   Sch Meds:  Scheduled Meds:  amLODipine   10 mg Oral Daily   Chlorhexidine  Gluconate Cloth  6 each Topical Daily   DULoxetine   60 mg Oral Daily   heparin  injection (subcutaneous)  5,000 Units Subcutaneous Q8H   memantine   10 mg Oral BID   metoprolol  succinate  25 mg Oral Daily   senna-docusate  1 tablet Oral BID   Continuous Infusions: PRN Meds:.acetaminophen  **OR** acetaminophen  (TYLENOL ) oral liquid 160 mg/5 mL **OR** acetaminophen , hydrALAZINE , labetalol , mouth rinse  Antimicrobials: Anti-infectives (From admission, onward)    None        I have personally reviewed the following labs and images: CBC: Recent Labs  Lab 12/01/23 1741 12/02/23 1023  WBC 4.2 4.1  NEUTROABS 2.5  --   HGB 13.6 13.6  HCT 42.0 41.3  MCV 92.3 90.8  PLT 177  162   BMP &GFR Recent Labs  Lab 12/01/23 1741 12/02/23 1023  NA 138 137  K 3.8 4.0  CL 106 109  CO2 25 19*  GLUCOSE 96 78  BUN 19 12  CREATININE 0.95 1.03*  CALCIUM  9.2 8.9   Estimated Creatinine Clearance: 34.8 mL/min (A) (by C-G formula based on SCr of 1.03 mg/dL (H)). Liver & Pancreas: Recent Labs  Lab 12/01/23 1741 12/02/23 1023  AST 19 19  ALT 11 12  ALKPHOS 49 44  BILITOT 0.4 0.8  PROT 6.7 5.8*  ALBUMIN 3.6 3.1*   No results for input(s): LIPASE, AMYLASE in the last 168 hours. No results for input(s): AMMONIA in the last 168 hours. Diabetic: Recent Labs    12/02/23 1023  HGBA1C 5.2   Recent Labs  Lab 12/02/23 2314 12/03/23 0325 12/03/23 0735 12/03/23 1120 12/03/23 1527  GLUCAP 108* 118* 115* 106* 157*   Cardiac Enzymes: No results for input(s): CKTOTAL, CKMB, CKMBINDEX, TROPONINI in the last 168 hours. No results for input(s): PROBNP in the last 8760 hours. Coagulation Profile: No results for input(s): INR, PROTIME in the last 168 hours. Thyroid  Function Tests: No results  for input(s): TSH, T4TOTAL, FREET4, T3FREE, THYROIDAB in the last 72 hours. Lipid Profile: Recent Labs    12/02/23 1023  CHOL 182  HDL 60  LDLCALC 108*  TRIG 71  CHOLHDL 3.0   Anemia Panel: No results for input(s): VITAMINB12, FOLATE, FERRITIN, TIBC, IRON, RETICCTPCT in the last 72 hours. Urine analysis:    Component Value Date/Time   COLORURINE YELLOW 12/01/2023 2221   APPEARANCEUR CLEAR 12/01/2023 2221   LABSPEC 1.018 12/01/2023 2221   PHURINE 5.0 12/01/2023 2221   GLUCOSEU NEGATIVE 12/01/2023 2221   HGBUR NEGATIVE 12/01/2023 2221   HGBUR 2+ 07/12/2009 1451   BILIRUBINUR NEGATIVE 12/01/2023 2221   BILIRUBINUR 3+ 06/20/2020 1159   KETONESUR NEGATIVE 12/01/2023 2221   PROTEINUR NEGATIVE 12/01/2023 2221   UROBILINOGEN 0.2 06/20/2020 1159   UROBILINOGEN 0.2 07/12/2009 2141   NITRITE NEGATIVE 12/01/2023 2221   LEUKOCYTESUR  NEGATIVE 12/01/2023 2221   Sepsis Labs: Invalid input(s): PROCALCITONIN, LACTICIDVEN  Microbiology: Recent Results (from the past 240 hours)  MRSA Next Gen by PCR, Nasal     Status: None   Collection Time: 12/02/23  3:22 AM   Specimen: Nasal Mucosa; Nasal Swab  Result Value Ref Range Status   MRSA by PCR Next Gen NOT DETECTED NOT DETECTED Final    Comment: (NOTE) The GeneXpert MRSA Assay (FDA approved for NASAL specimens only), is one component of a comprehensive MRSA colonization surveillance program. It is not intended to diagnose MRSA infection nor to guide or monitor treatment for MRSA infections. Test performance is not FDA approved in patients less than 32 years old. Performed at [facility], [address], [place], [place] [address]     Radiology Studies: VAS US  LOWER EXTREMITY VENOUS (DVT) Result Date: 12/03/2023  Lower Venous DVT Study Patient Name:  [name] [name]  Date of Exam:   12/03/2023 Medical Rec #: [id number]     Accession #:    [id number] Date of Birth: 10/23/1940     Patient Gender: F Patient Age:   66 years Exam Location:  [facility] Procedure:      VAS US  LOWER EXTREMITY VENOUS (DVT) Referring Phys: PRAMOD SETHI --------------------------------------------------------------------------------  Indications: Stroke.  Comparison Study: No previous exams available for review. Performing Technologist: Ezzie Potters RVT, RDMS  Examination Guidelines: A complete evaluation includes B-mode imaging, spectral Doppler, color Doppler, and power Doppler as needed of all accessible portions of each vessel. Bilateral testing is considered an integral part of a complete examination. Limited examinations for reoccurring indications may be performed as noted. The reflux portion of the exam is performed with the patient in reverse Trendelenburg.  +---------+---------------+---------+-----------+----------+--------------+ RIGHT     CompressibilityPhasicitySpontaneityPropertiesThrombus Aging +---------+---------------+---------+-----------+----------+--------------+ CFV      Full           Yes      Yes                                 +---------+---------------+---------+-----------+----------+--------------+ SFJ      Full                                                        +---------+---------------+---------+-----------+----------+--------------+ FV Prox  Full           Yes  Yes                                 +---------+---------------+---------+-----------+----------+--------------+ FV Mid   Full           Yes      Yes                                 +---------+---------------+---------+-----------+----------+--------------+ FV DistalFull           Yes      Yes                                 +---------+---------------+---------+-----------+----------+--------------+ PFV      Full                                                        +---------+---------------+---------+-----------+----------+--------------+ POP      Full           Yes      Yes                                 +---------+---------------+---------+-----------+----------+--------------+ PTV      Full                                                        +---------+---------------+---------+-----------+----------+--------------+   +---------+---------------+---------+-----------+----------+--------------+ LEFT     CompressibilityPhasicitySpontaneityPropertiesThrombus Aging +---------+---------------+---------+-----------+----------+--------------+ CFV      Full           Yes      Yes                                 +---------+---------------+---------+-----------+----------+--------------+ SFJ      Full                                                        +---------+---------------+---------+-----------+----------+--------------+ FV Prox  Full           Yes      Yes                                  +---------+---------------+---------+-----------+----------+--------------+ FV Mid   Full           Yes      Yes                                 +---------+---------------+---------+-----------+----------+--------------+ FV DistalFull           Yes      Yes                                 +---------+---------------+---------+-----------+----------+--------------+  PFV      Full                                                        +---------+---------------+---------+-----------+----------+--------------+ POP      Full           Yes      Yes                                 +---------+---------------+---------+-----------+----------+--------------+ PTV      Full                                                        +---------+---------------+---------+-----------+----------+--------------+ PERO     Full                                                        +---------+---------------+---------+-----------+----------+--------------+     Summary: BILATERAL: - No evidence of deep vein thrombosis seen in the lower extremities, bilaterally. -No evidence of popliteal cyst, bilaterally.   *See table(s) above for measurements and observations. Electronically signed by Fonda Rim on 12/03/2023 at 6:47:44 PM.    Final       Akiel Fennell T. Rosaelena Kemnitz [facility]  If 7PM-7AM, please contact night-coverage [email or web address] 12/04/2023, 11:56 AM

## 2023-12-04 NOTE — Plan of Care (Deleted)
   Problem: Education: Goal: Knowledge of disease or condition will improve Outcome: Progressing Goal: Knowledge of secondary prevention will improve (MUST DOCUMENT ALL) Outcome: Progressing Goal: Knowledge of patient specific risk factors will improve (DELETE if not current risk factor) Outcome: Progressing   Problem: Intracerebral Hemorrhage Tissue Perfusion: Goal: Complications of Intracerebral Hemorrhage will be minimized Outcome: Progressing

## 2023-12-04 NOTE — Progress Notes (Signed)
 Physical Therapy Treatment Patient Details Name: [name] [name] MRN: [id number] DOB: 1940-06-03 Today's Date: 12/04/2023   History of Present Illness Patient is an 83 y/o female admitted 12/02/23 with one and half weeks of confusion and difficulty ambulating.  Daughter brought her to the ED and pt found to have R frontal hemorrhage.  PMH positive for CAD, CKD, GERD, arthritis, HTN, fibromyalgia, interstitial cystitis.    PT Comments  Pt received in supine and agreeable to session. Pt is able to perform bed mobility and transfers with up to min A +2 due to initial posterior bias, but improves with increased time standing. Pt able to progress gait distance with 1 seated rest break this session. Pt demonstrates decreased awareness with L drift towards the wall, requiring assist to correct. Pt requires frequent cues due to decreased carryover. Pt demonstrates impaired short term memory and problem solving with attempts to find room with pt not remembering room number after therapist told her requiring increased cues. Pt continues to benefit from PT services to progress toward functional mobility goals.     If plan is discharge home, recommend the following: A lot of help with bathing/dressing/bathroom;A lot of help with walking and/or transfers;Assistance with cooking/housework;Supervision due to cognitive status   Can travel by private vehicle        Equipment Recommendations  Other (comment) (TBA)    Recommendations for Other Services       Precautions / Restrictions Precautions Precautions: Fall Recall of Precautions/Restrictions: Impaired Precaution/Restrictions Comments: SBP<160 Restrictions Weight Bearing Restrictions Per Provider Order: No     Mobility  Bed Mobility Overal bed mobility: Needs Assistance Bed Mobility: Supine to Sit     Supine to sit: Min assist     General bed mobility comments: cues for initiation and sequencing. minA for trunk elevation and scooting forward to  EOB    Transfers Overall transfer level: Needs assistance Equipment used: Rolling walker (2 wheels) Transfers: Sit to/from Stand Sit to Stand: Min assist, +2 physical assistance, +2 safety/equipment           General transfer comment: Min A +2 progressing to min A +1 to stand from EOB and recliner. cues for hand placement    Ambulation/Gait Ambulation/Gait assistance: Min assist, +2 safety/equipment Gait Distance (Feet): 75 Feet (x2) Assistive device: Rolling walker (2 wheels) Gait Pattern/deviations: Decreased stride length, Shuffle, Trunk flexed, Step-through pattern, Drifts right/left       General Gait Details: Pt demonstrates short steps with low foot clearance, but is able to briefly increase step length with frequent cues. Cues for upright posture and assist for RW management due to L drift   Stairs             Wheelchair Mobility     Tilt Bed    Modified Rankin (Stroke Patients Only) Modified Rankin (Stroke Patients Only) Pre-Morbid Rankin Score: Slight disability Modified Rankin: Moderately severe disability     Balance Overall balance assessment: Needs assistance Sitting-balance support: Bilateral upper extremity supported, Feet supported Sitting balance-Leahy Scale: Fair Sitting balance - Comments: sitting EOB   Standing balance support: Bilateral upper extremity supported, During functional activity, Reliant on assistive device for balance Standing balance-Leahy Scale: Poor Standing balance comment: with RW support. Initial posterior bias requiring assist, but improves during ambulation                            Communication Communication Communication: No apparent difficulties  Cognition Arousal: Alert  Behavior During Therapy: WFL for tasks assessed/performed   PT - Cognitive impairments: Attention, Problem solving, Sequencing, Safety/Judgement                       PT - Cognition Comments: Decreased  awareness Following commands: Impaired Following commands impaired: Follows one step commands inconsistently, Follows one step commands with increased time    Cueing Cueing Techniques: Verbal cues, Gestural cues, Tactile cues  Exercises      General Comments        Pertinent Vitals/Pain Pain Assessment Pain Assessment: Faces Faces Pain Scale: No hurt     PT Goals (current goals can now be found in the care plan section) Acute Rehab PT Goals Patient Stated Goal: return home with intermittent assist eventually PT Goal Formulation: With patient/family Time For Goal Achievement: 12/16/23 Progress towards PT goals: Progressing toward goals    Frequency    Min 3X/week       AM-PAC PT 6 Clicks Mobility   Outcome Measure  Help needed turning from your back to your side while in a flat bed without using bedrails?: A Little Help needed moving from lying on your back to sitting on the side of a flat bed without using bedrails?: A Little Help needed moving to and from a bed to a chair (including a wheelchair)?: A Lot Help needed standing up from a chair using your arms (e.g., wheelchair or bedside chair)?: A Lot Help needed to walk in hospital room?: A Lot Help needed climbing 3-5 steps with a railing? : Total 6 Click Score: 13    End of Session Equipment Utilized During Treatment: Gait belt Activity Tolerance: Patient tolerated treatment well Patient left: in chair;with call bell/phone within reach Nurse Communication: Mobility status;Other (comment) (chair alarm box not working) PT Visit Diagnosis: Other abnormalities of gait and mobility (R26.89);Other symptoms and signs involving the nervous system (R29.898);Apraxia (R48.2)     Time: 8596-8561 PT Time Calculation (min) (ACUTE ONLY): 35 min  Charges:    $Gait Training: 8-22 mins $Therapeutic Activity: 8-22 mins PT General Charges $$ ACUTE PT VISIT: 1 Visit                     Darryle George, PTA Acute  Rehabilitation Services Secure Chat Preferred  [phone]    Darryle George 12/04/2023, 4:15 PM

## 2023-12-04 NOTE — Progress Notes (Signed)
 SLP Cancellation Note  Patient Details Name: [name] [name] MRN: [id number] DOB: 1940-07-19   Cancelled treatment:       Reason Eval/Treat Not Completed: Other (comment);Patient at procedure or test/unavailable   Pat Dana Debo,M.S.,CCC-SLP 12/04/2023, 1:04 PM

## 2023-12-04 NOTE — Progress Notes (Signed)
 Inpatient Rehab Admissions Coordinator:   Auth with [facility] Medicare is pending.  I will continue to follow.   Reche Lowers, PT, DPT Admissions Coordinator [phone] 12/04/23  10:32 AM

## 2023-12-04 NOTE — Progress Notes (Signed)
 STROKE TEAM PROGRESS NOTE   SUBJECTIVE (INTERIM HISTORY) Today patient is out of bed, sitting in chair with son at bedside.   Lower extremity Doppler is negative for DVT.  Therapies recommend inpatient rehab.  On exam, patient continues to exhibit only mild left lower extremity weakness, with mental status at baseline with slight word finding difficulty.   Medically stable to be transferred to rehab when bed available. OBJECTIVE  CBC    Component Value Date/Time   WBC 4.1 12/02/2023 1023   RBC 4.55 12/02/2023 1023   HGB 13.6 12/02/2023 1023   HCT 41.3 12/02/2023 1023   PLT 162 12/02/2023 1023   MCV 90.8 12/02/2023 1023   MCH 29.9 12/02/2023 1023   MCHC 32.9 12/02/2023 1023   RDW 13.4 12/02/2023 1023   LYMPHSABS 1.1 12/01/2023 1741   MONOABS 0.3 12/01/2023 1741   EOSABS 0.1 12/01/2023 1741   BASOSABS 0.0 12/01/2023 1741    BMET    Component Value Date/Time   NA 137 12/02/2023 1023   NA 138 05/01/2018 0000   K 4.0 12/02/2023 1023   CL 109 12/02/2023 1023   CO2 19 (L) 12/02/2023 1023   GLUCOSE 78 12/02/2023 1023   BUN 12 12/02/2023 1023   BUN 28 (A) 05/01/2018 0000   CREATININE 1.03 (H) 12/02/2023 1023   CREATININE 0.92 11/02/2015 1538   CALCIUM  8.9 12/02/2023 1023   CALCIUM  9.6 08/10/2012 0945   GFRNONAA 54 (L) 12/02/2023 1023   GFRNONAA 62 11/02/2015 1538    IMAGING past 24 hours VAS US  LOWER EXTREMITY VENOUS (DVT) Result Date: 12/03/2023  Lower Venous DVT Study Patient Name:  [name] [name]  Date of Exam:   12/03/2023 Medical Rec #: [id number]     Accession #:    [id number] Date of Birth: 1941-04-21     Patient Gender: F Patient Age:   83 years Exam Location:  [facility] Procedure:      VAS US  LOWER EXTREMITY VENOUS (DVT) Referring Phys: Emmalyn Hinson --------------------------------------------------------------------------------  Indications: Stroke.  Comparison Study: No previous exams available for review. Performing Technologist: Ezzie Potters RVT, RDMS  Examination  Guidelines: A complete evaluation includes B-mode imaging, spectral Doppler, color Doppler, and power Doppler as needed of all accessible portions of each vessel. Bilateral testing is considered an integral part of a complete examination. Limited examinations for reoccurring indications may be performed as noted. The reflux portion of the exam is performed with the patient in reverse Trendelenburg.  +---------+---------------+---------+-----------+----------+--------------+ RIGHT    CompressibilityPhasicitySpontaneityPropertiesThrombus Aging +---------+---------------+---------+-----------+----------+--------------+ CFV      Full           Yes      Yes                                 +---------+---------------+---------+-----------+----------+--------------+ SFJ      Full                                                        +---------+---------------+---------+-----------+----------+--------------+ FV Prox  Full           Yes      Yes                                 +---------+---------------+---------+-----------+----------+--------------+  FV Mid   Full           Yes      Yes                                 +---------+---------------+---------+-----------+----------+--------------+ FV DistalFull           Yes      Yes                                 +---------+---------------+---------+-----------+----------+--------------+ PFV      Full                                                        +---------+---------------+---------+-----------+----------+--------------+ POP      Full           Yes      Yes                                 +---------+---------------+---------+-----------+----------+--------------+ PTV      Full                                                        +---------+---------------+---------+-----------+----------+--------------+   +---------+---------------+---------+-----------+----------+--------------+ LEFT      CompressibilityPhasicitySpontaneityPropertiesThrombus Aging +---------+---------------+---------+-----------+----------+--------------+ CFV      Full           Yes      Yes                                 +---------+---------------+---------+-----------+----------+--------------+ SFJ      Full                                                        +---------+---------------+---------+-----------+----------+--------------+ FV Prox  Full           Yes      Yes                                 +---------+---------------+---------+-----------+----------+--------------+ FV Mid   Full           Yes      Yes                                 +---------+---------------+---------+-----------+----------+--------------+ FV DistalFull           Yes      Yes                                 +---------+---------------+---------+-----------+----------+--------------+ PFV      Full                                                        +---------+---------------+---------+-----------+----------+--------------+  POP      Full           Yes      Yes                                 +---------+---------------+---------+-----------+----------+--------------+ PTV      Full                                                        +---------+---------------+---------+-----------+----------+--------------+ PERO     Full                                                        +---------+---------------+---------+-----------+----------+--------------+     Summary: BILATERAL: - No evidence of deep vein thrombosis seen in the lower extremities, bilaterally. -No evidence of popliteal cyst, bilaterally.   *See table(s) above for measurements and observations. Electronically signed by Fonda Rim on 12/03/2023 at 6:47:44 PM.    Final     Vitals:   12/03/23 2354 12/04/23 0422 12/04/23 0814 12/04/23 1135  BP: 133/63 (!) 165/70 (!) 152/74 114/60  Pulse: 67 64 (!) 59 63  Resp: 12 13  16 18   Temp: 97.7 F (36.5 C) (!) 97.5 F (36.4 C) 98.1 F (36.7 C) 98 F (36.7 C)  TempSrc: Oral Oral Oral Oral  SpO2: 98% 98% 98% 100%  Weight:      Height:         PHYSICAL EXAM General:  Alert, well-nourished, well-developed patient in no acute distress CV: Regular rate and rhythm on monitor Respiratory:  Regular, unlabored respirations on room air     NEURO:  Mental Status: AA&Ox3, history given by daughter and son Speech/Language: Fluent with intact comprehension, mild word finding difficulty suggestive of anomic aphasia   Cranial Nerves:  II: PERRL. Visual fields full.  III, IV, VI: EOMI. Eyelids elevate symmetrically.  V: Sensation is intact to light touch and symmetrical to face.  VII: Face is symmetrical resting and smiling VIII: hearing intact to voice. IX, X: Palate elevates symmetrically. Phonation is normal.  KP:Dynloizm shrug 5/5. XII: tongue is midline without fasciculations. Motor: LLE weakness 3/5, all other extremities 5/5 Tone: is normal and bulk is normal Sensation- Intact to light touch bilaterally. Extinction absent to light touch to DSS.   Coordination: FTN intact bilaterally, HKS: no ataxia in BLE.No drift.  Gait- deferred  Most Recent NIH  Dizziness Present: No (08/14 0800) Headache Present: No (08/14 0800) Interval: Shift assessment (08/14 0800) Level of Consciousness (1a.)   : Alert, keenly responsive (08/14 0800) LOC Questions (1b. )   : Answers both questions correctly (08/14 0800) LOC Commands (1c. )   : Performs both tasks correctly (08/14 0800) Best Gaze (2. )  : Normal (08/14 0800) Visual (3. )  : No visual loss (08/14 0800) Facial Palsy (4. )    : Normal symmetrical movements (08/14 0800) Motor Arm, Left (5a. )   : No drift (08/14 0800) Motor Arm, Right (5b. ) : No drift (08/14 0800) Motor Leg, Left (6a. )  : No drift (08/14 0800) Motor Leg, Right (6b. ) :  No drift (08/14 0800) Limb Ataxia (7. ): Absent (08/14 0800) Sensory (8. )   : Normal, no sensory loss (08/14 0800) Best Language (9. )  : Mild-to-moderate aphasia (08/14 0800) Dysarthria (10. ): Normal (08/14 0800) Extinction/Inattention (11.)   : No Abnormality (08/14 0800) Complete NIHSS TOTAL: 1 (08/14 0800)    ASSESSMENT/PLAN [name] [name] is a 83 y.o. female with history of dementia, GERD, hypertension, CAD, fibromyalgia, basilar tip aneurysm s/p plantation in 2021 who was admitted for a week and a half history of confusion and difficulty with ambulation.  CT head revealed a right frontal hemorrhage.  Neurological exam was notable only for weakness isolated to the left lower extremity.  Differential considerations include small vessel disease versus cerebral amyloid angiopathy.  We will continue current blood pressure control with systolic goal below 160 when mobilizing out of bed.  Patient will be transferred to floor today with stroke as consult.    Intraparenchymal Hemorrhage:  right frontal lobe, superior frontal gyrus Etiology: Hypertensive versus amyloid angiopathy Code Stroke CT head acute intraparenchymal hematoma involving the superior frontal gyrus of the right frontal lobe measuring 3.1 x 3.6 x 1.5 cm.  Mild mass effect with effacement of the overlying sulci.  No midline shift.  Extensive periventricular white matter changes, stable since prior examination.  Embolization coils in the region of the basilar tip. CTA head & neck no acute intracranial hemorrhage or ischemic change.  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  Moderate to severe focal stenosis at the junction of the left P1 and P2 segment, associated on the previous MRI.  Incidental nodule within the right lobe of thyroid  11 x 8 mm. MRI 2 acute/subacute intra-axial hemorrhages in the right hemisphere-known superior frontal gyrus hematoma, 13 mm stable since CT yesterday, and a smaller right inferior frontal gyrus hematoma, 5 mm, which is obscured on CT but basilar tip coil pack  artifact.  Underlying microhemorrhages in the left parietal frontal lobes appear increased from MRI last year. MRA treated basilar tip aneurysm with a small 2 mm area of possible pleural compaction along the base of the aneurysm.  No LVO.  But positive for moderate to severe stenosis of the left PCA P1/P2 junction. 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 Yes; Fluid consistency: Thin  [] Expand by Default      Advance diet as tolerated   Other Stroke Risk Factors Coronary artery disease   Other Active Problems Fibromyalgia GERD  Hospital day # 2  Patient is neurologically stable.  Continues to have baseline dementia and leg weakness.  Therapies recommend inpatient rehab.  Patient medically stable to be transferred to inpatient rehab when bed available.  Blood pressure adequately controlled.  Long discussion with patient and family at the bedside and answered questions.  Stroke team will sign off.  Can call for questions.  Eather Popp, MD Medical Director [facility] Pager: [phone] 12/04/2023 2:05 PM   To contact Stroke Continuity provider, please refer to [email or web address]. After hours, contact General Neurology

## 2023-12-04 NOTE — Plan of Care (Signed)
   Problem: Education: Goal: Knowledge of disease or condition will improve Outcome: Progressing Goal: Knowledge of secondary prevention will improve (MUST DOCUMENT ALL) Outcome: Progressing Goal: Knowledge of patient specific risk factors will improve (DELETE if not current risk factor) Outcome: Progressing   Problem: Intracerebral Hemorrhage Tissue Perfusion: Goal: Complications of Intracerebral Hemorrhage will be minimized Outcome: Progressing

## 2023-12-05 ENCOUNTER — Inpatient Hospital Stay (HOSPITAL_COMMUNITY)
Admission: AD | Admit: 2023-12-05 | Discharge: 2023-12-16 | DRG: 056 | Disposition: A | Discharge status: Home Health | Source: Intra-hospital | Attending: Physical Medicine and Rehabilitation | Admitting: Physical Medicine and Rehabilitation

## 2023-12-05 ENCOUNTER — Other Ambulatory Visit: Payer: Self-pay

## 2023-12-05 ENCOUNTER — Encounter (HOSPITAL_COMMUNITY): Payer: Self-pay | Admitting: Physical Medicine and Rehabilitation

## 2023-12-05 DIAGNOSIS — I2510 Atherosclerotic heart disease of native coronary artery without angina pectoris: Secondary | ICD-10-CM | POA: Diagnosis present

## 2023-12-05 DIAGNOSIS — N189 Chronic kidney disease, unspecified: Secondary | ICD-10-CM | POA: Diagnosis present

## 2023-12-05 DIAGNOSIS — Z96653 Presence of artificial knee joint, bilateral: Secondary | ICD-10-CM | POA: Diagnosis present

## 2023-12-05 DIAGNOSIS — Z882 Allergy status to sulfonamides status: Secondary | ICD-10-CM

## 2023-12-05 DIAGNOSIS — K5900 Constipation, unspecified: Secondary | ICD-10-CM | POA: Diagnosis present

## 2023-12-05 DIAGNOSIS — F32A Depression, unspecified: Secondary | ICD-10-CM | POA: Diagnosis present

## 2023-12-05 DIAGNOSIS — I629 Nontraumatic intracranial hemorrhage, unspecified: Secondary | ICD-10-CM | POA: Diagnosis not present

## 2023-12-05 DIAGNOSIS — I671 Cerebral aneurysm, nonruptured: Secondary | ICD-10-CM | POA: Diagnosis present

## 2023-12-05 DIAGNOSIS — I69198 Other sequelae of nontraumatic intracerebral hemorrhage: Principal | ICD-10-CM

## 2023-12-05 DIAGNOSIS — N179 Acute kidney failure, unspecified: Secondary | ICD-10-CM | POA: Diagnosis present

## 2023-12-05 DIAGNOSIS — Z8249 Family history of ischemic heart disease and other diseases of the circulatory system: Secondary | ICD-10-CM

## 2023-12-05 DIAGNOSIS — N3010 Interstitial cystitis (chronic) without hematuria: Secondary | ICD-10-CM | POA: Diagnosis present

## 2023-12-05 DIAGNOSIS — I739 Peripheral vascular disease, unspecified: Secondary | ICD-10-CM | POA: Diagnosis present

## 2023-12-05 DIAGNOSIS — K5901 Slow transit constipation: Secondary | ICD-10-CM | POA: Diagnosis not present

## 2023-12-05 DIAGNOSIS — Z833 Family history of diabetes mellitus: Secondary | ICD-10-CM

## 2023-12-05 DIAGNOSIS — Z801 Family history of malignant neoplasm of trachea, bronchus and lung: Secondary | ICD-10-CM

## 2023-12-05 DIAGNOSIS — F02B0 Dementia in other diseases classified elsewhere, moderate, without behavioral disturbance, psychotic disturbance, mood disturbance, and anxiety: Secondary | ICD-10-CM | POA: Diagnosis not present

## 2023-12-05 DIAGNOSIS — Z85828 Personal history of other malignant neoplasm of skin: Secondary | ICD-10-CM

## 2023-12-05 DIAGNOSIS — M797 Fibromyalgia: Secondary | ICD-10-CM | POA: Diagnosis present

## 2023-12-05 DIAGNOSIS — Z79899 Other long term (current) drug therapy: Secondary | ICD-10-CM

## 2023-12-05 DIAGNOSIS — Z96611 Presence of right artificial shoulder joint: Secondary | ICD-10-CM | POA: Diagnosis present

## 2023-12-05 DIAGNOSIS — E739 Lactose intolerance, unspecified: Secondary | ICD-10-CM | POA: Diagnosis present

## 2023-12-05 DIAGNOSIS — I6389 Other cerebral infarction: Secondary | ICD-10-CM | POA: Diagnosis not present

## 2023-12-05 DIAGNOSIS — Z886 Allergy status to analgesic agent status: Secondary | ICD-10-CM

## 2023-12-05 DIAGNOSIS — Z87442 Personal history of urinary calculi: Secondary | ICD-10-CM

## 2023-12-05 DIAGNOSIS — Z8051 Family history of malignant neoplasm of kidney: Secondary | ICD-10-CM

## 2023-12-05 DIAGNOSIS — R4701 Aphasia: Secondary | ICD-10-CM | POA: Diagnosis present

## 2023-12-05 DIAGNOSIS — I129 Hypertensive chronic kidney disease with stage 1 through stage 4 chronic kidney disease, or unspecified chronic kidney disease: Secondary | ICD-10-CM | POA: Diagnosis present

## 2023-12-05 DIAGNOSIS — Z933 Colostomy status: Secondary | ICD-10-CM | POA: Diagnosis not present

## 2023-12-05 DIAGNOSIS — I619 Nontraumatic intracerebral hemorrhage, unspecified: Secondary | ICD-10-CM | POA: Diagnosis not present

## 2023-12-05 DIAGNOSIS — G8194 Hemiplegia, unspecified affecting left nondominant side: Secondary | ICD-10-CM | POA: Diagnosis not present

## 2023-12-05 DIAGNOSIS — Z820 Family history of epilepsy and other diseases of the nervous system: Secondary | ICD-10-CM

## 2023-12-05 DIAGNOSIS — N390 Urinary tract infection, site not specified: Secondary | ICD-10-CM | POA: Diagnosis not present

## 2023-12-05 DIAGNOSIS — I69120 Aphasia following nontraumatic intracerebral hemorrhage: Secondary | ICD-10-CM | POA: Diagnosis not present

## 2023-12-05 DIAGNOSIS — F0280 Dementia in other diseases classified elsewhere without behavioral disturbance: Secondary | ICD-10-CM | POA: Diagnosis present

## 2023-12-05 DIAGNOSIS — G309 Alzheimer's disease, unspecified: Secondary | ICD-10-CM | POA: Diagnosis present

## 2023-12-05 DIAGNOSIS — F809 Developmental disorder of speech and language, unspecified: Secondary | ICD-10-CM | POA: Diagnosis present

## 2023-12-05 DIAGNOSIS — Z803 Family history of malignant neoplasm of breast: Secondary | ICD-10-CM

## 2023-12-05 DIAGNOSIS — I10 Essential (primary) hypertension: Secondary | ICD-10-CM

## 2023-12-05 DIAGNOSIS — Z8262 Family history of osteoporosis: Secondary | ICD-10-CM

## 2023-12-05 DIAGNOSIS — M419 Scoliosis, unspecified: Secondary | ICD-10-CM | POA: Diagnosis present

## 2023-12-05 DIAGNOSIS — G936 Cerebral edema: Secondary | ICD-10-CM | POA: Diagnosis not present

## 2023-12-05 DIAGNOSIS — F419 Anxiety disorder, unspecified: Secondary | ICD-10-CM | POA: Diagnosis present

## 2023-12-05 DIAGNOSIS — E785 Hyperlipidemia, unspecified: Secondary | ICD-10-CM | POA: Diagnosis present

## 2023-12-05 DIAGNOSIS — B9620 Unspecified Escherichia coli [E. coli] as the cause of diseases classified elsewhere: Secondary | ICD-10-CM | POA: Diagnosis not present

## 2023-12-05 DIAGNOSIS — M8580 Other specified disorders of bone density and structure, unspecified site: Secondary | ICD-10-CM | POA: Diagnosis present

## 2023-12-05 DIAGNOSIS — F0390 Unspecified dementia without behavioral disturbance: Secondary | ICD-10-CM | POA: Diagnosis present

## 2023-12-05 DIAGNOSIS — Z888 Allergy status to other drugs, medicaments and biological substances status: Secondary | ICD-10-CM

## 2023-12-05 DIAGNOSIS — K219 Gastro-esophageal reflux disease without esophagitis: Secondary | ICD-10-CM | POA: Diagnosis present

## 2023-12-05 DIAGNOSIS — Z823 Family history of stroke: Secondary | ICD-10-CM

## 2023-12-05 LAB — CBC
HCT: 38.5 % (ref 36.0–46.0)
Hemoglobin: 12.5 g/dL (ref 12.0–15.0)
MCH: 29.8 pg (ref 26.0–34.0)
MCHC: 32.5 g/dL (ref 30.0–36.0)
MCV: 91.7 fL (ref 80.0–100.0)
Platelets: 164 K/uL (ref 150–400)
RBC: 4.2 MIL/uL (ref 3.87–5.11)
RDW: 13.3 % (ref 11.5–15.5)
WBC: 4.6 K/uL (ref 4.0–10.5)
nRBC: 0 % (ref 0.0–0.2)

## 2023-12-05 LAB — CREATININE, SERUM
Creatinine, Ser: 1.03 mg/dL — ABNORMAL HIGH (ref 0.44–1.00)
GFR, Estimated: 54 mL/min — ABNORMAL LOW (ref >60)

## 2023-12-05 MED ORDER — AMLODIPINE BESYLATE 10 MG PO TABS
10.0000 mg | ORAL_TABLET | Freq: Every day | ORAL | Status: DC
  Administered 2023-12-06 – 2023-12-16 (×11): 10 mg via ORAL
  Filled 2023-12-05 (×11): qty 1

## 2023-12-05 MED ORDER — SENNOSIDES-DOCUSATE SODIUM 8.6-50 MG PO TABS
1.0000 | ORAL_TABLET | Freq: Two times a day (BID) | ORAL | Status: DC
  Administered 2023-12-05 – 2023-12-07 (×4): 1 via ORAL
  Filled 2023-12-05 (×4): qty 1

## 2023-12-05 MED ORDER — HEPARIN SODIUM (PORCINE) 5000 UNIT/ML IJ SOLN
5000.0000 [IU] | Freq: Three times a day (TID) | INTRAMUSCULAR | Status: DC
Start: 2023-12-05 — End: 2023-12-05

## 2023-12-05 MED ORDER — ACETAMINOPHEN 325 MG PO TABS
650.0000 mg | ORAL_TABLET | ORAL | Status: DC | PRN
  Administered 2023-12-07 – 2023-12-09 (×3): 650 mg via ORAL
  Filled 2023-12-05 (×3): qty 2

## 2023-12-05 MED ORDER — METOPROLOL SUCCINATE ER 25 MG PO TB24
12.5000 mg | ORAL_TABLET | Freq: Every day | ORAL | Status: DC
  Administered 2023-12-05: 12.5 mg via ORAL

## 2023-12-05 MED ORDER — ROSUVASTATIN CALCIUM 20 MG PO TABS
20.0000 mg | ORAL_TABLET | Freq: Every day | ORAL | Status: DC
  Administered 2023-12-06 – 2023-12-16 (×11): 20 mg via ORAL
  Filled 2023-12-05 (×11): qty 1

## 2023-12-05 MED ORDER — ROSUVASTATIN CALCIUM 20 MG PO TABS: 20.0000 mg | ORAL_TABLET | Freq: Every day | ORAL | Status: DC

## 2023-12-05 MED ORDER — METOPROLOL SUCCINATE ER 25 MG PO TB24
12.5000 mg | ORAL_TABLET | Freq: Every day | ORAL | Status: DC
  Administered 2023-12-06 – 2023-12-16 (×11): 12.5 mg via ORAL
  Filled 2023-12-05 (×11): qty 1

## 2023-12-05 MED ORDER — MEMANTINE HCL 10 MG PO TABS
10.0000 mg | ORAL_TABLET | Freq: Two times a day (BID) | ORAL | Status: DC
  Administered 2023-12-05 – 2023-12-16 (×22): 10 mg via ORAL
  Filled 2023-12-05 (×22): qty 1

## 2023-12-05 MED ORDER — HEPARIN SODIUM (PORCINE) 5000 UNIT/ML IJ SOLN
5000.0000 [IU] | Freq: Three times a day (TID) | INTRAMUSCULAR | Status: DC
Start: 2023-12-05 — End: 2023-12-16
  Administered 2023-12-05 – 2023-12-16 (×32): 5000 [IU] via SUBCUTANEOUS
  Filled 2023-12-05 (×32): qty 1

## 2023-12-05 MED ORDER — ACETAMINOPHEN 160 MG/5ML PO SOLN: 650.0000 mg | ORAL | Status: DC | PRN

## 2023-12-05 MED ORDER — DULOXETINE HCL 60 MG PO CPEP
60.0000 mg | ORAL_CAPSULE | Freq: Every day | ORAL | Status: DC
  Administered 2023-12-06 – 2023-12-16 (×11): 60 mg via ORAL
  Filled 2023-12-05 (×11): qty 1

## 2023-12-05 MED ORDER — ACETAMINOPHEN 650 MG RE SUPP: 650.0000 mg | RECTAL | Status: DC | PRN

## 2023-12-05 NOTE — Progress Notes (Signed)
 PROGRESS NOTE  IRELYND ZUMSTEIN FMW:[id number] DOB: 12/15/1940   PCP: Charlett Apolinar POUR, MD  Patient is from: Home.  Lives alone.  Independently ambulates at baseline but recently started using walker.  DOA: 12/01/2023 LOS: 3  Chief complaints Chief Complaint  Patient presents with   Aphasia     Brief Narrative / Interim history: 83 year old F with PMH of dementia, CAD, HTN, basilar tip aneurysm s/p coil embolization in 2021, GERD, fibromyalgia and osteoarthritis presenting with confusion and difficulty ambulating for about a week and up and admitted to neuro ICU due to right frontal IPH.  Patient was not on anticoagulation or antiplatelet.  Neurological exam was notable for weakness isolated to the left lower extremity.  Neurosurgery consulted and did not feel IPH is related to patient's aneurysm, and recommended conservative management.  Therapy recommended CIR. Patient transferred to hospitalist service on 12/04/2023.  Medically stable for discharge  Subjective: Seen and examined earlier this morning.  No major events overnight or this morning.  No complaints but not a great historian.  Sitting on bedside chair.  She is awake and alert and oriented to self and place but not time.  Objective: Vitals:   12/04/23 2336 12/05/23 0354 12/05/23 0747 12/05/23 0927  BP: 113/67 129/65 125/69   Pulse: 70 60 (!) 57 62  Resp: 14 14 17    Temp: (!) 97.4 F (36.3 C) 98 F (36.7 C) 97.9 F (36.6 C)   TempSrc: Oral Oral Oral   SpO2: 98% 99% 99%   Weight:      Height:        Examination:  GENERAL: No apparent distress.  Nontoxic. HEENT: MMM.  Vision and hearing grossly intact.  NECK: Supple.  No apparent JVD.  RESP:  No IWOB.  Fair aeration bilaterally. CVS:  RRR. Heart sounds normal.  ABD/GI/GU: BS+. Abd soft, NTND.  MSK/EXT:  Moves extremities. No apparent deformity. No edema.  SKIN: no apparent skin lesion or wound NEURO: Awake and alert.  Oriented to self and place.  Follows commands.   Clear speech.  Motor symmetric.  Sensory grossly intact.  No pronator drift. PSYCH: Calm. Normal affect.   Consultants:  Neurosurgery Neurology  Procedures: None  Microbiology summarized: None  Assessment and plan: Intraparenchymal Hemorrhage:  right frontal lobe, superior frontal gyrus Etiology: Hypertensive versus amyloid angiopathy Code Stroke CT head showed acute IPH involving the superior frontal gyrus of the right frontal lobe measuring 3.1 x 3.6 x 1.5 cm.  Mild mass effect with effacement of the overlying sulci.  No midline shift.  Stable extensive periventricular white matter changes.  Embolization coils in the region of the basilar tip. CTA head & neck no acute ICH or ischemic change.  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  Moderate to severe focal stenosis at the junction of the left P1 and P2 segment, associated on the previous MRI.  Incidental nodule within the right lobe of thyroid  11 x 8 mm. MRI showed 2 acute/subacute intra-axial hemorrhages in the right hemisphere-known superior frontal gyrus hematoma, 13 mm stable since CT yesterday, and a smaller right inferior frontal gyrus hematoma, 5 mm, which is obscured on CT but basilar tip coil pack artifact.  Underlying microhemorrhages in the left parietal frontal lobes appear increased from MRI last year. MRA treated basilar tip aneurysm with a small 2 mm area of possible pleural compaction along the base of the aneurysm.  No LVO.  But positive for moderate to severe stenosis of the left PCA  P1/P2 junction. TTE with without significant finding.  LDL 108.  A1c 5.5%. No antithrombotic prior to admission and now. Therapy recommended CIR.   Dementia without behavioral disturbance: Lives alone.  Independent for ADLs but family helps with cooking.  Oriented to self, place and family.  Follows commands. Continue home memantine  Reorientation and delirium precaution  Hypertension: Off Cleviprex .  BP improved.  HR in 50s  and 60s. Increased amlodipine  to 10 mg daily on 8/14 Decrease Toprol -XL to 12.5 mg daily.  Holding parameters in place.   Hyperlipidemia: LDL 108.  Goal less than 70. Resume home Crestor  20 mg daily.  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  Generalized weakness/ambulatory dysfunction PT/OT  Dysphagia On regular diet.   Fibromyalgia Continue home Cymbalta .  GERD-not taking PPI.  Body mass index is 20.89 kg/m.           DVT prophylaxis:  heparin  injection 5,000 Units Start: 12/03/23 1400  Code Status: Full code Family Communication: None at bedside today. Level of care: Telemetry Medical Status is: Inpatient Remains inpatient appropriate because: Intracranial hemorrhage   Final disposition: CIR   35 minutes with more than 50% spent in reviewing records, counseling patient/family and coordinating care.   Sch Meds:  Scheduled Meds:  amLODipine   10 mg Oral Daily   Chlorhexidine  Gluconate Cloth  6 each Topical Daily   DULoxetine   60 mg Oral Daily   heparin  injection (subcutaneous)  5,000 Units Subcutaneous Q8H   memantine   10 mg Oral BID   metoprolol  succinate  12.5 mg Oral Daily   rosuvastatin   20 mg Oral Daily   senna-docusate  1 tablet Oral BID   Continuous Infusions: PRN Meds:.acetaminophen  **OR** acetaminophen  (TYLENOL ) oral liquid 160 mg/5 mL **OR** acetaminophen , hydrALAZINE , labetalol , mouth rinse  Antimicrobials: Anti-infectives (From admission, onward)    None        I have personally reviewed the following labs and images: CBC: Recent Labs  Lab 12/01/23 1741 12/02/23 1023  WBC 4.2 4.1  NEUTROABS 2.5  --   HGB 13.6 13.6  HCT 42.0 41.3  MCV 92.3 90.8  PLT 177 162   BMP &GFR Recent Labs  Lab 12/01/23 1741 12/02/23 1023  NA 138 137  K 3.8 4.0  CL 106 109  CO2 25 19*  GLUCOSE 96 78  BUN 19 12  CREATININE 0.95 1.03*  CALCIUM  9.2 8.9    Estimated Creatinine Clearance: 34.8 mL/min (A) (by C-G formula based on SCr of 1.03 mg/dL (H)). Liver & Pancreas: Recent Labs  Lab 12/01/23 1741 12/02/23 1023  AST 19 19  ALT 11 12  ALKPHOS 49 44  BILITOT 0.4 0.8  PROT 6.7 5.8*  ALBUMIN 3.6 3.1*   No results for input(s): LIPASE, AMYLASE in the last 168 hours. No results for input(s): AMMONIA in the last 168 hours. Diabetic: No results for input(s): HGBA1C in the last 72 hours.  Recent Labs  Lab 12/02/23 2314 12/03/23 0325 12/03/23 0735 12/03/23 1120 12/03/23 1527  GLUCAP 108* 118* 115* 106* 157*   Cardiac Enzymes: No results for input(s): CKTOTAL, CKMB, CKMBINDEX, TROPONINI in the last 168 hours. No results for input(s): PROBNP in the last 8760 hours. Coagulation Profile: No results for input(s): INR, PROTIME in the last 168 hours. Thyroid  Function Tests: No results for input(s): TSH, T4TOTAL, FREET4, T3FREE, THYROIDAB in the last 72 hours. Lipid Profile: No results for input(s): CHOL, HDL,  LDLCALC, TRIG, CHOLHDL, LDLDIRECT in the last 72 hours.  Anemia Panel: No results for input(s): VITAMINB12, FOLATE, FERRITIN, TIBC, IRON, RETICCTPCT in the last 72 hours. Urine analysis:    Component Value Date/Time   COLORURINE YELLOW 12/01/2023 2221   APPEARANCEUR CLEAR 12/01/2023 2221   LABSPEC 1.018 12/01/2023 2221   PHURINE 5.0 12/01/2023 2221   GLUCOSEU NEGATIVE 12/01/2023 2221   HGBUR NEGATIVE 12/01/2023 2221   HGBUR 2+ 07/12/2009 1451   BILIRUBINUR NEGATIVE 12/01/2023 2221   BILIRUBINUR 3+ 06/20/2020 1159   KETONESUR NEGATIVE 12/01/2023 2221   PROTEINUR NEGATIVE 12/01/2023 2221   UROBILINOGEN 0.2 06/20/2020 1159   UROBILINOGEN 0.2 07/12/2009 2141   NITRITE NEGATIVE 12/01/2023 2221   LEUKOCYTESUR NEGATIVE 12/01/2023 2221   Sepsis Labs: Invalid input(s): PROCALCITONIN, LACTICIDVEN  Microbiology: Recent Results (from the past 240 hours)  MRSA Next  Gen by PCR, Nasal     Status: None   Collection Time: 12/02/23  3:22 AM   Specimen: Nasal Mucosa; Nasal Swab  Result Value Ref Range Status   MRSA by PCR Next Gen NOT DETECTED NOT DETECTED Final    Comment: (NOTE) The GeneXpert MRSA Assay (FDA approved for NASAL specimens only), is one component of a comprehensive MRSA colonization surveillance program. It is not intended to diagnose MRSA infection nor to guide or monitor treatment for MRSA infections. Test performance is not FDA approved in patients less than 60 years old. Performed at [facility], [address] [place], [place] [address]     Radiology Studies: No results found.     Canda Podgorski T. Sevanna Ballengee [facility]  If 7PM-7AM, please contact night-coverage [email or web address] 12/05/2023, 11:45 AM

## 2023-12-05 NOTE — Discharge Summary (Signed)
 Physician Discharge Summary  LIISA PICONE FMW:[id number] DOB: Feb 01, 1941 DOA: 12/01/2023  PCP: Charlett Apolinar POUR, MD  Admit date: 12/01/2023 Discharge date: 12/05/23  Admitted From: Home Disposition: Acute inpatient rehab (CIR) Recommendations for Outpatient Follow-up:  Outpatient follow-up with neurology as below Check CMP and CBC in 1 week Please follow up on the following pending results: None    Discharge Condition: Stable CODE STATUS: Full code Diet Orders (From admission, onward)     Start     Ordered   12/04/23 0000  Diet - low sodium heart healthy        12/04/23 9167   12/02/23 1140  Diet regular Room service appropriate? Yes; Fluid consistency: Thin  Diet effective now       Question Answer Comment  Room service appropriate? Yes   Fluid consistency: Thin      12/02/23 1139             Follow-up Information     [facility]. Schedule an appointment as soon as possible for a visit in 2 month(s).   Specialty: Neurology Why: Please call make an appointment for 8 weeks after discharge Contact information: [address] [place]  [phone]                Hospital course 83 year old F with PMH of dementia, CAD, HTN, basilar tip aneurysm s/p coil embolization in 2021, GERD, fibromyalgia and osteoarthritis presenting with confusion and difficulty ambulating for about a week and up and admitted to neuro ICU due to right frontal IPH.  Patient was not on anticoagulation or antiplatelet.  Neurological exam was notable for weakness isolated to the left lower extremity.  Neurosurgery consulted and did not feel IPH is related to patient's aneurysm, and recommended conservative management.  Therapy recommended acute inpatient rehab.  Patient transferred to hospitalist service on 12/04/2023.  Remained stable.  Discharged to CIR on 8/15.  See individual problem list below for more.   Problems  addressed during this hospitalization Intraparenchymal Hemorrhage:  right frontal lobe, superior frontal gyrus Etiology: Hypertensive versus amyloid angiopathy Code Stroke CT head showed acute IPH involving the superior frontal gyrus of the right frontal lobe measuring 3.1 x 3.6 x 1.5 cm.  Mild mass effect with effacement of the overlying sulci.  No midline shift.  Stable extensive periventricular white matter changes.  Embolization coils in the region of the basilar tip. CTA head & neck no acute ICH or ischemic change.  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  Moderate to severe focal stenosis at the junction of the left P1 and P2 segment, associated on the previous MRI.  Incidental nodule within the right lobe of thyroid  11 x 8 mm. MRI showed 2 acute/subacute intra-axial hemorrhages in the right hemisphere-known superior frontal gyrus hematoma, 13 mm stable since CT yesterday, and a smaller right inferior frontal gyrus hematoma, 5 mm, which is obscured on CT but basilar tip coil pack artifact.  Underlying microhemorrhages in the left parietal frontal lobes appear increased from MRI last year. MRA treated basilar tip aneurysm with a small 2 mm area of possible pleural compaction along the base of the aneurysm.  No LVO.  But positive for moderate to severe stenosis of the left PCA P1/P2 junction. TTE with without significant finding.  LDL 108.  A1c 5.5%. No antithrombotic prior to admission and now. Therapy recommended CIR.   Dementia without behavioral disturbance: Lives alone.  Independent for ADLs but  family helps with cooking.  Oriented to self, place and family.  Follows commands. Continue home memantine  Reorientation and delirium precaution   Hypertension: Off Cleviprex .  BP improved.  HR in 50s and 60s. Increased amlodipine  to 10 mg daily on 8/14 Decrease Toprol -XL to 12.5 mg daily.    Hyperlipidemia: LDL 108.  Goal less than 70. Resume home Crestor  20 mg daily.  Was on 40 mg  daily but not taking   Hx of Stroke/TIA 2021: Cerebral aneurysm without rupture s/p plantation  Crestor  as above.   Generalized weakness/ambulatory dysfunction PT/OT-recommended CIR.   Dysphagia On regular diet.   Fibromyalgia Continue home Cymbalta .   GERD-not taking PPI.   Body mass index is 20.89 kg/m.           Consultations: Neurology Neurosurgery  Time spent 35  minutes  Vital signs Vitals:   12/05/23 0354 12/05/23 0747 12/05/23 0927 12/05/23 1146  BP: 129/65 125/69  (!) 131/49  Pulse: 60 (!) 57 62 74  Temp: 98 F (36.7 C) 97.9 F (36.6 C)  98 F (36.7 C)  Resp: 14 17  18   Height:      Weight:      SpO2: 99% 99%  99%  TempSrc: Oral Oral  Oral  BMI (Calculated):         Discharge exam  GENERAL: No apparent distress.  Nontoxic. HEENT: MMM.  Vision and hearing grossly intact.  NECK: Supple.  No apparent JVD.  RESP:  No IWOB.  Fair aeration bilaterally. CVS:  RRR. Heart sounds normal.  ABD/GI/GU: BS+. Abd soft, NTND.  MSK/EXT:  Moves extremities. No apparent deformity. No edema.  SKIN: no apparent skin lesion or wound NEURO: Awake and alert.  Oriented to self and place.  Follows commands.  Clear speech.  Motor symmetric.  Sensory grossly intact.  No pronator drift. PSYCH: Calm. Normal affect.   Discharge Instructions Discharge Instructions     Ambulatory referral to Neurology   Complete by: As directed    An appointment is requested in approximately: 8 weeks   Diet - low sodium heart healthy   Complete by: As directed    Increase activity slowly   Complete by: As directed       Allergies as of 12/05/2023       Reactions   Gadolinium Derivatives Hives   PER DR DERRY PT NEEDS 13HOUR PREP BEFORE FUTURE CONTRASTED MRI'S//KMS   Aspirin  Other (See Comments)   Acute renal failure    Lactose Intolerance (gi) Other (See Comments)   Gi upset   Meloxicam    REACTION: acute renal failure  with hypovolemic trigger Hospital  3 2011 Pt states  allergic to all NSAIDS    Metoprolol     No issues with Metoprolol  extended release   Sulfamethoxazole Hives        Medication List     STOP taking these medications    Belsomra  10 MG Tabs Generic drug: Suvorexant    donepezil  10 MG tablet Commonly known as: ARICEPT    melatonin 5 MG Tabs   omeprazole  40 MG capsule Commonly known as: PRILOSEC       TAKE these medications    acetaminophen  325 MG tablet Commonly known as: TYLENOL  Take 2 tablets (650 mg total) by mouth every 4 (four) hours as needed for mild pain (pain score 1-3) or fever (or temp > 37.5 C (99.5 F)).   ALIGN PO Take 1 tablet by mouth daily.   amLODipine  10 MG tablet Commonly known as:  NORVASC  Take 1 tablet (10 mg total) by mouth daily. What changed:  medication strength how much to take   DULoxetine  60 MG capsule Commonly known as: CYMBALTA  TAKE 1 CAPSULE BY MOUTH TWICE A DAY What changed: when to take this   memantine  10 MG tablet Commonly known as: NAMENDA  Take half  tablet (5 mg at night) for 2 weeks, then increase to 1 tablet (5 mg) twice a day  Then increase to 1 tablet (10 mg)  twice a day   metoprolol  succinate 25 MG 24 hr tablet Commonly known as: TOPROL -XL Take 0.5 tablets (12.5 mg total) by mouth daily. What changed:  how much to take additional instructions   rosuvastatin  20 MG tablet Commonly known as: CRESTOR  Take 1 tablet (20 mg total) by mouth daily. Start taking on: December 06, 2023 What changed:  medication strength how much to take         Procedures/Studies:   VAS US  LOWER EXTREMITY VENOUS (DVT) Result Date: 12/03/2023  Lower Venous DVT Study Patient Name:  ORLEAN HOLTROP  Date of Exam:   12/03/2023 Medical Rec #: [id number]     Accession #:    [id number] Date of Birth: Jan 19, 1941     Patient Gender: F Patient Age:   8 years Exam Location:  [facility] Procedure:      VAS US  LOWER EXTREMITY VENOUS (DVT) Referring Phys: PRAMOD SETHI  --------------------------------------------------------------------------------  Indications: Stroke.  Comparison Study: No previous exams available for review. Performing Technologist: Ezzie Potters RVT, RDMS  Examination Guidelines: A complete evaluation includes B-mode imaging, spectral Doppler, color Doppler, and power Doppler as needed of all accessible portions of each vessel. Bilateral testing is considered an integral part of a complete examination. Limited examinations for reoccurring indications may be performed as noted. The reflux portion of the exam is performed with the patient in reverse Trendelenburg.  +---------+---------------+---------+-----------+----------+--------------+ RIGHT    CompressibilityPhasicitySpontaneityPropertiesThrombus Aging +---------+---------------+---------+-----------+----------+--------------+ CFV      Full           Yes      Yes                                 +---------+---------------+---------+-----------+----------+--------------+ SFJ      Full                                                        +---------+---------------+---------+-----------+----------+--------------+ FV Prox  Full           Yes      Yes                                 +---------+---------------+---------+-----------+----------+--------------+ FV Mid   Full           Yes      Yes                                 +---------+---------------+---------+-----------+----------+--------------+ FV DistalFull           Yes      Yes                                 +---------+---------------+---------+-----------+----------+--------------+  PFV      Full                                                        +---------+---------------+---------+-----------+----------+--------------+ POP      Full           Yes      Yes                                 +---------+---------------+---------+-----------+----------+--------------+ PTV      Full                                                         +---------+---------------+---------+-----------+----------+--------------+   +---------+---------------+---------+-----------+----------+--------------+ LEFT     CompressibilityPhasicitySpontaneityPropertiesThrombus Aging +---------+---------------+---------+-----------+----------+--------------+ CFV      Full           Yes      Yes                                 +---------+---------------+---------+-----------+----------+--------------+ SFJ      Full                                                        +---------+---------------+---------+-----------+----------+--------------+ FV Prox  Full           Yes      Yes                                 +---------+---------------+---------+-----------+----------+--------------+ FV Mid   Full           Yes      Yes                                 +---------+---------------+---------+-----------+----------+--------------+ FV DistalFull           Yes      Yes                                 +---------+---------------+---------+-----------+----------+--------------+ PFV      Full                                                        +---------+---------------+---------+-----------+----------+--------------+ POP      Full           Yes      Yes                                 +---------+---------------+---------+-----------+----------+--------------+ PTV  Full                                                        +---------+---------------+---------+-----------+----------+--------------+ PERO     Full                                                        +---------+---------------+---------+-----------+----------+--------------+     Summary: BILATERAL: - No evidence of deep vein thrombosis seen in the lower extremities, bilaterally. -No evidence of popliteal cyst, bilaterally.   *See table(s) above for measurements and observations. Electronically signed by  Fonda Rim on 12/03/2023 at 6:47:44 PM.    Final    ECHOCARDIOGRAM COMPLETE Result Date: 12/02/2023    ECHOCARDIOGRAM REPORT   Patient Name:   ISHIKA CHESTERFIELD Date of Exam: 12/02/2023 Medical Rec #:  [id number]    Height:       63.0 in Accession #:    [id number]   Weight:       117.9 lb Date of Birth:  1940-12-08    BSA:          1.545 m Patient Age:    82 years     BP:           135/71 mmHg Patient Gender: F            HR:           81 bpm. Exam Location:  Inpatient Procedure: 2D Echo, Cardiac Doppler and Color Doppler (Both Spectral and Color            Flow Doppler were utilized during procedure). Indications:    Stroke  History:        Patient has no prior history of Echocardiogram examinations.                 Stroke; Risk Factors:Hypertension and Dyslipidemia.  Sonographer:    Therisa Crouch Referring Phys: [id number] [facility] IMPRESSIONS  1. Left ventricular ejection fraction, by estimation, is 65 to 70%. The left ventricle has normal function. The left ventricle has no regional wall motion abnormalities. Left ventricular diastolic parameters are consistent with Grade I diastolic dysfunction (impaired relaxation).  2. Right ventricular systolic function is normal. The right ventricular size is normal. Tricuspid regurgitation signal is inadequate for assessing PA pressure.  3. The mitral valve is grossly normal. Trivial mitral valve regurgitation. No evidence of mitral stenosis.  4. The aortic valve is tricuspid. Aortic valve regurgitation is not visualized. No aortic stenosis is present.  5. The inferior vena cava is normal in size with greater than 50% respiratory variability, suggesting right atrial pressure of 3 mmHg. Conclusion(s)/Recommendation(s): No intracardiac source of embolism detected on this transthoracic study. Consider a transesophageal echocardiogram to exclude cardiac source of embolism if clinically indicated. FINDINGS  Left Ventricle: Left ventricular ejection fraction, by estimation,  is 65 to 70%. The left ventricle has normal function. The left ventricle has no regional wall motion abnormalities. The left ventricular internal cavity size was normal in size. There is  no left ventricular hypertrophy. Left ventricular diastolic parameters are consistent with Grade I diastolic dysfunction (impaired relaxation). Right Ventricle: The right ventricular size is  normal. No increase in right ventricular wall thickness. Right ventricular systolic function is normal. Tricuspid regurgitation signal is inadequate for assessing PA pressure. Left Atrium: Left atrial size was normal in size. Right Atrium: Right atrial size was normal in size. Pericardium: There is no evidence of pericardial effusion. Presence of epicardial fat layer. Mitral Valve: The mitral valve is grossly normal. Trivial mitral valve regurgitation. No evidence of mitral valve stenosis. Tricuspid Valve: The tricuspid valve is grossly normal. Tricuspid valve regurgitation is trivial. No evidence of tricuspid stenosis. Aortic Valve: The aortic valve is tricuspid. Aortic valve regurgitation is not visualized. No aortic stenosis is present. Aortic valve mean gradient measures 4.0 mmHg. Aortic valve peak gradient measures 8.0 mmHg. Aortic valve area, by VTI measures 2.19 cm. Pulmonic Valve: The pulmonic valve was grossly normal. Pulmonic valve regurgitation is not visualized. No evidence of pulmonic stenosis. Aorta: The aortic root and ascending aorta are structurally normal, with no evidence of dilitation. Venous: The right lower pulmonary vein is normal. The inferior vena cava is normal in size with greater than 50% respiratory variability, suggesting right atrial pressure of 3 mmHg. IAS/Shunts: The atrial septum is grossly normal.  LEFT VENTRICLE PLAX 2D LVIDd:         3.40 cm   Diastology LVIDs:         2.10 cm   LV e' medial:    5.55 cm/s LV PW:         1.10 cm   LV E/e' medial:  8.1 LV IVS:        1.00 cm   LV e' lateral:   6.20 cm/s LVOT  diam:     1.90 cm   LV E/e' lateral: 7.3 LV SV:         61 LV SV Index:   40 LVOT Area:     2.84 cm  RIGHT VENTRICLE          IVC RV Basal diam:  2.80 cm  IVC diam: 1.30 cm TAPSE (M-mode): 1.8 cm LEFT ATRIUM             Index LA diam:        2.80 cm 1.81 cm/m LA Vol (A2C):   21.7 ml 14.05 ml/m LA Vol (A4C):   43.3 ml 28.03 ml/m LA Biplane Vol: 31.3 ml 20.26 ml/m  AORTIC VALVE AV Area (Vmax):    2.17 cm AV Area (Vmean):   2.21 cm AV Area (VTI):     2.19 cm AV Vmax:           141.00 cm/s AV Vmean:          92.700 cm/s AV VTI:            0.280 m AV Peak Grad:      8.0 mmHg AV Mean Grad:      4.0 mmHg LVOT Vmax:         108.00 cm/s LVOT Vmean:        72.300 cm/s LVOT VTI:          0.216 m LVOT/AV VTI ratio: 0.77  AORTA Ao Root diam: 2.80 cm Ao Asc diam:  2.70 cm MITRAL VALVE MV Area (PHT): 4.83 cm    SHUNTS MV Decel Time: 157 msec    Systemic VTI:  0.22 m MV E velocity: 45.10 cm/s  Systemic Diam: 1.90 cm MV A velocity: 79.10 cm/s MV E/A ratio:  0.57 Darryle Decent MD Electronically signed by Darryle Decent MD Signature Date/Time: 12/02/2023/3:14:32 PM  Final    EEG adult Result Date: 12/02/2023 Shelton Arlin KIDD, MD     12/02/2023 12:38 PM Patient Name: [name] [name] MRN: [id number] Epilepsy Attending: Arlin KIDD Shelton Referring Physician/Provider: Khaliqdina, Salman, MD Date: 12/02/2023 Duration: 22.40 seconds Patient history: 83 y.o. female with hx of dementia, GERD, hypertension, CAD, fibromyalgia, basilar tip aneurysm s/p embolization in 2021 who is brought in with about a week and a half history of confusion and difficulty with ambulation. EEG to evaluate for seizure. Level of alertness: Awake AEDs during EEG study: None Technical aspects: This EEG study was done with scalp electrodes positioned according to the 10-20 International system of electrode placement. Electrical activity was reviewed with band pass filter of 1-70Hz , sensitivity of 7 uV/mm, display speed of 81mm/sec with a 60Hz  notched filter  applied as appropriate. EEG data were recorded continuously and digitally stored.  Video monitoring was available and reviewed as appropriate. Description: The posterior dominant rhythm consists of 8 Hz activity of moderate voltage (25-35 uV) seen predominantly in posterior head regions, symmetric and reactive to eye opening and eye closing. Hyperventilation and photic stimulation were not performed.   IMPRESSION: This study is within normal limits. No seizures or epileptiform discharges were seen throughout the recording. A normal interictal EEG does not exclude the diagnosis of epilepsy. Arlin KIDD Shelton   CT ANGIO HEAD NECK W WO CM Result Date: 12/02/2023 EXAM: CTA Head and Neck with Intravenous Contrast. CT Head without Contrast. CLINICAL HISTORY: Neuro deficit, acute, stroke suspected; Stroke, hemorrhagic; follow up non contrast CT Head and a CT Angio of head and neck. TECHNIQUE: Axial CTA images of the head and neck performed with intravenous contrast. MIP reconstructed images were created and reviewed. Axial computed tomography images of the head/brain performed without intravenous contrast. Note: Per PQRS, the description of internal carotid artery percent stenosis, including 0 percent or normal exam, is based on [place] American Symptomatic Carotid Endarterectomy Trial (NASCET) criteria. Dose reduction technique was used including one or more of the following: automated exposure control, adjustment of mA and kV according to patient size, and/or iterative reconstruction. CONTRAST: 75 mL of iohexol  (OMNIPAQUE ) 350 MG/ML injection. COMPARISON: MRA of the head dated 12/02/2023 and a conventional cerebral arteriogram dated 12/01/2020. FINDINGS: CT HEAD: BRAIN: No acute intraparenchymal hemorrhage. No mass lesion. No CT evidence for acute territorial infarct. No midline shift or extra-axial collection. VENTRICLES: No hydrocephalus. ORBITS: The orbits are unremarkable. SINUSES AND MASTOIDS: The paranasal sinuses  and mastoid air cells are clear. CTA NECK: COMMON CAROTID ARTERIES: No significant stenosis. No dissection or occlusion. Moderate calcific plaque present within the aortic arch which appears normal in caliber. There is 3-vessel origin of the great arteries. INTERNAL CAROTID ARTERIES: No stenosis by NASCET criteria. No dissection or occlusion. The right internal carotid artery is tortuous. The left internal carotid artery is also mildly tortuous. Mild-to-moderate calcific plaque within the right and left carotid bulbs, but no associated stenosis. VERTEBRAL ARTERIES: No significant stenosis. No dissection or occlusion. The vertebral arteries are codominant and normal in caliber throughout their respective courses. CTA HEAD: ANTERIOR CEREBRAL ARTERIES: No significant stenosis. No occlusion. 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 MIDDLE CEREBRAL ARTERIES: No significant stenosis. No occlusion. No aneurysm. Mild atheromatous irregularity of the M1 segments, but no flow-limiting stenosis. POSTERIOR CEREBRAL ARTERIES: No significant stenosis. No occlusion. No aneurysm. The right posterior cerebral artery is normal in caliber and unremarkable. Moderate calcific atheromatous disease at the junction  of the left P1 and P2 segments, causing moderate-to-severe focal stenosis, as demonstrated on the previous MRI. BASILAR ARTERY: No significant stenosis. No occlusion. Status post coiling of the basilar tip. There is calcific atheromatous disease within the distal basilar artery, causing mild-to-moderate stenosis, less than 50%. No apparent residual or recurrent aneurysm. OTHER: There is moderate calcific plaque within the carotid siphons, with no flow-limiting stenosis. Moderate spray artifact from embolization coils within the tip of the basilar artery largely obscuring the anterior communicating artery. SOFT TISSUES: There appears to be a solid nodule within  the right lobe of the thyroid  measuring approximately 11 x 8 mm. According to the American Thyroid  Association guidelines, the finding is consistent with an incidental thyroid  nodule >1 cm and the recommendation is dedicated thyroid  ultrasound and serum TSH measurement. BONES: There is degenerative endolisthesis present at C3-4. There is also reversal of the normal cervical lordosis and diffuse multilevel chronic degenerative disc disease. LIMITATIONS/ARTIFACTS: There is free artifact obscuring and distorting the lower neck. IMPRESSION: 1. No acute intracranial hemorrhage or ischemic change. 2. Status post coiling of the basilar tip with no apparent residual or recurrent aneurysm. 3. 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 4. Moderate-to-severe focal stenosis at the junction of the left P1 and P2 segments, as demonstrated on the previous MRI. 5. Incidental solid nodule within the right lobe of the thyroid  measuring approximately 11 x 8 mm. Recommend dedicated thyroid  ultrasound and serum TSH measurement. Electronically signed by: evalene coho 12/02/2023 10:10 AM EDT RP Workstation: [id number]   MR ANGIO HEAD WO CONTRAST Result Date: 12/02/2023 CLINICAL DATA:  83 year old female with superior right frontal lobe hemorrhage on Head CT yesterday. Neurologic deficit. 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 EXAM: MRA HEAD WITHOUT CONTRAST TECHNIQUE: Angiographic images of the Circle of Willis were acquired using MRA technique without intravenous contrast. COMPARISON:  Brain MRI today reported separately. No prior CT or MR angiogram. FINDINGS: Anterior circulation: Antegrade flow signal in both ICA siphons. Ophthalmic and right posterior communicating artery origins appear normal. Left posterior communicating artery diminutive or absent. Mild ICA siphon irregularity without stenosis. Patent carotid termini,  MCA and ACA origins. Mildly ectatic anterior communicating artery and difficult to exclude a small 2 mm Acomm aneurysm as seen on series 4, image 63. This could alternatively be infundibulum. Tortuous proximal A2 segments. Visible ACA branches otherwise within normal limits. Left MCA M1 bifurcates early without stenosis. Right MCA M1 and bifurcation appear patent without stenosis. Visible MCA branches are within normal limits. Posterior circulation: Antegrade flow in the posterior circulation. Codominant distal vertebral arteries and vertebrobasilar junction appear patent without stenosis. Patent PICA origins. Patent basilar artery without stenosis. Artifact at the basilar tip from embolization coil pack. Small area of 2 mm flow signal along the inferior surface of the coil pack, series 4, image 64 and series 455, image 9. Adjacent PCA origins remain patent. Right posterior communicating artery is visible. SCA origins are patent. Right PCA branches are within normal limits. There is a short segment moderate to severe stenosis of the left PCA P1/P2 junction (series 454, image 6). With maintained distal left PCA branch flow. Anatomic variants: None significant. Other: Brain MRI today is reported separately. IMPRESSION: 1. 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 2. Difficult to exclude a small 2 mm A-comm aneurysm, but might an infundibulum. 3. No  large vessel occlusion. 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 4. Abnormal Brain MRI today reported separately. Electronically Signed   By: VEAR Hurst M.D.   On: 12/02/2023 09:23   MR BRAIN WO CONTRAST Result Date: 12/02/2023 CLINICAL DATA:  83 year old female with superior right frontal lobe hemorrhage on Head CT yesterday. Neurologic deficit. 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 EXAM: MRI HEAD WITHOUT CONTRAST TECHNIQUE:  Multiplanar, multiecho pulse sequences of the brain and surrounding structures were obtained without intravenous contrast. COMPARISON:  Head CT yesterday.  Brain MRI 11/27/2022. FINDINGS: Brain: Significant streak artifact through the anterior cranial fossa on CT yesterday from large basilar tip aneurysm coil pack. MRI reveals similar appearing right inferior frontal gyrus and superior frontal gyrus intra-axial hemorrhage, with T1 and T2 signal heterogeneity (series 9, image 45 in the right inferior frontal gyrus. That hematoma is 17 x 26 by 21 mm (AP by transverse by CC, volume estimated at 5 mL) where as the left superior frontal gyrus hematoma visible by CT is 38 x 22 x 31 mm (AP by transverse by CC, volume estimated at 13 mL) and extends to the superior surface of the brain. Comparing across modalities, neither hemorrhage appears significantly changed from the CT yesterday. Mild regional T2 and FLAIR hyperintense edema at both sites. No extra-axial or intraventricular blood is identified. DWI susceptibility at both sites with no other restricted diffusion suggestive of acute ischemia. No significant midline shift or intracranial mass effect, basilar cisterns remain normal. Scattered chronic microhemorrhages in the left parietal and occipital lobes also appear increased from the SWI last year. Curvilinear chronic hemosiderin in the inferior left cerebellum is stable. Elsewhere confluent bilateral cerebral white matter T2 and FLAIR hyperintensity has not significantly changed. Deep gray nuclei T2 heterogeneity appears stable. Negative pituitary and cervicomedullary junction. Vascular: Major intracranial vascular flow voids are preserved. Heterogeneity at the basilar artery tip appears stable from the MRI last year. Skull and upper cervical spine: Stable. Background bone marrow signal is within normal limits. Sinuses/Orbits: Stable, negative. Other: Mastoids are clear. IMPRESSION: 1. MRI reveals two acute/subacute  intra-axial hemorrhages in the right hemisphere: Known superior frontal gyrus hematoma (13 mL stable since CT yesterday), and a smaller right inferior frontal gyrus hematoma (5 mL) which was obscured on CT by basilar tip coil pack artifact. Underlying micro-hemorrhages in the left parietal and occipital lobes appear increased from MRI last year. 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 2. Mild associated cerebral edema with no significant intracranial mass effect or other complicating features. 3. No other acute intracranial abnormality. Previously treated basilar tip aneurysm. Advanced chronic white matter disease. Electronically Signed   By: VEAR Hurst M.D.   On: 12/02/2023 09:16   CT Head Wo Contrast Addendum Date: 12/01/2023 ADDENDUM REPORT: 12/01/2023 23:57 ADDENDUM: These results were called by telephone at the time of interpretation on 12/01/2023 at 11:56 pm to provider April Palumbo, MD, who verbally acknowledged these results. Electronically Signed   By: Dorethia Molt M.D.   On: 12/01/2023 23:57   Result Date: 12/01/2023 CLINICAL DATA:  Mental status change, unknown cause, dementia EXAM: CT HEAD WITHOUT CONTRAST TECHNIQUE: Contiguous axial images were obtained from the base of the skull through the vertex without intravenous contrast. RADIATION DOSE REDUCTION: This exam was performed according to the departmental dose-optimization program which includes automated exposure control, adjustment of the mA and/or kV according to patient size and/or  use of iterative reconstruction technique. COMPARISON:  MRI 11/27/2022 FINDINGS: Brain: Acute intraparenchymal hematoma is seen involving the superior frontal gyrus of the right frontal lobe measuring 3.1 x 3.6 x 1.5 cm in greatest dimension (23/6, 22/2). Small amount of surrounding cytotoxic. Mild mass effect with effacement of the overlying sulci. No associated subdural or subarachnoid hemorrhage. No intraventricular  hemorrhage. No midline shift. Significant streak artifact related to embolization coils in the region of the basilar tip. Mild parenchymal volume loss is commensurate with the patient's age. Extensive periventricular white matter changes are identified, stable since prior examination, possibly related to small vessel ischemia. Ventricular size is normal. Cerebellum is unremarkable. Vascular: No hyperdense vessel or unexpected calcification. Embolization coils noted within the basilar tip. Skull: Normal. Negative for fracture or focal lesion. Sinuses/Orbits: No acute finding. Other: Mastoid air cells and middle ear cavities are clear. IMPRESSION: 1. Acute intraparenchymal hematoma involving the superior frontal gyrus of the right frontal lobe measuring 3.1 x 3.6 x 1.5 cm in greatest dimension. Mild mass effect with effacement of the overlying sulci. No midline shift. 2. Extensive periventricular white matter changes, stable since prior examination, possibly related to small vessel ischemia. 3. Embolization coils in the region of the basilar tip. Electronically Signed: By: Dorethia Molt M.D. On: 12/01/2023 23:50       The results of significant diagnostics from this hospitalization (including imaging, microbiology, ancillary and laboratory) are listed below for reference.     Microbiology: Recent Results (from the past 240 hours)  MRSA Next Gen by PCR, Nasal     Status: None   Collection Time: 12/02/23  3:22 AM   Specimen: Nasal Mucosa; Nasal Swab  Result Value Ref Range Status   MRSA by PCR Next Gen NOT DETECTED NOT DETECTED Final    Comment: (NOTE) The GeneXpert MRSA Assay (FDA approved for NASAL specimens only), is one component of a comprehensive MRSA colonization surveillance program. It is not intended to diagnose MRSA infection nor to guide or monitor treatment for MRSA infections. Test performance is not FDA approved in patients less than 41 years old. Performed at [facility]  [facility], [address] [place], [place] [address]      Labs:  CBC: Recent Labs  Lab 12/01/23 1741 12/02/23 1023  WBC 4.2 4.1  NEUTROABS 2.5  --   HGB 13.6 13.6  HCT 42.0 41.3  MCV 92.3 90.8  PLT 177 162   BMP &GFR Recent Labs  Lab 12/01/23 1741 12/02/23 1023  NA 138 137  K 3.8 4.0  CL 106 109  CO2 25 19*  GLUCOSE 96 78  BUN 19 12  CREATININE 0.95 1.03*  CALCIUM  9.2 8.9   Estimated Creatinine Clearance: 34.8 mL/min (A) (by C-G formula based on SCr of 1.03 mg/dL (H)). Liver & Pancreas: Recent Labs  Lab 12/01/23 1741 12/02/23 1023  AST 19 19  ALT 11 12  ALKPHOS 49 44  BILITOT 0.4 0.8  PROT 6.7 5.8*  ALBUMIN 3.6 3.1*   No results for input(s): LIPASE, AMYLASE in the last 168 hours. No results for input(s): AMMONIA in the last 168 hours. Diabetic: No results for input(s): HGBA1C in the last 72 hours. Recent Labs  Lab 12/02/23 2314 12/03/23 0325 12/03/23 0735 12/03/23 1120 12/03/23 1527  GLUCAP 108* 118* 115* 106* 157*   Cardiac Enzymes: No results for input(s): CKTOTAL, CKMB, CKMBINDEX, TROPONINI in the last 168 hours. No results for input(s): PROBNP in the last 8760 hours. Coagulation Profile: No results for  input(s): INR, PROTIME in the last 168 hours. Thyroid  Function Tests: No results for input(s): TSH, T4TOTAL, FREET4, T3FREE, THYROIDAB in the last 72 hours. Lipid Profile: No results for input(s): CHOL, HDL, LDLCALC, TRIG, CHOLHDL, LDLDIRECT in the last 72 hours. Anemia Panel: No results for input(s): VITAMINB12, FOLATE, FERRITIN, TIBC, IRON, RETICCTPCT in the last 72 hours. Urine analysis:    Component Value Date/Time   COLORURINE YELLOW 12/01/2023 2221   APPEARANCEUR CLEAR 12/01/2023 2221   LABSPEC 1.018 12/01/2023 2221   PHURINE 5.0 12/01/2023 2221   GLUCOSEU NEGATIVE 12/01/2023 2221   HGBUR NEGATIVE 12/01/2023 2221   HGBUR 2+ 07/12/2009 1451   BILIRUBINUR NEGATIVE 12/01/2023 2221    BILIRUBINUR 3+ 06/20/2020 1159   KETONESUR NEGATIVE 12/01/2023 2221   PROTEINUR NEGATIVE 12/01/2023 2221   UROBILINOGEN 0.2 06/20/2020 1159   UROBILINOGEN 0.2 07/12/2009 2141   NITRITE NEGATIVE 12/01/2023 2221   LEUKOCYTESUR NEGATIVE 12/01/2023 2221   Sepsis Labs: Invalid input(s): PROCALCITONIN, LACTICIDVEN   SIGNED:  Damiel Barthold T Jacinto Keil, MD  [facility] 12/05/2023, 12:13 PM

## 2023-12-05 NOTE — Care Management Important Message (Signed)
 Important Message  Patient Details  Name: [name] [name] MRN: [id number] Date of Birth: 03-21-1941   Important Message Given:  Yes - Medicare IM     Claretta Deed 12/05/2023, 2:51 PM

## 2023-12-05 NOTE — Progress Notes (Addendum)
   Inpatient Rehabilitation Admissions Coordinator   We have received insurance Auth for CIR admit. I called patient's daughter and she has concern/questions of insurance coverage for caregiver support after a CIR admit. She will contact [employer] then call me back. I hold admit until I hear back from her.  Heron Leavell, RN, MSN Rehab Admissions Coordinator [phone] 12/05/2023 11:58 AM   Daughter has contacted [employer] with her Questions and is in agreement to CIR admit today. I will make the arrangements. Acute team and TOC made aware.  Heron Leavell, RN, MSN Rehab Admissions Coordinator [phone] 12/05/2023 12:10 PM

## 2023-12-05 NOTE — Progress Notes (Signed)
 Urbano Albright, MD  Physician Physical Medicine and Rehabilitation   Consult Note    Signed   Date of Service: 12/03/2023 10:45 AM  Related encounter: ED to Hosp-Admission (Current) from 12/01/2023 in [place] [facility]   Signed     Expand All Collapse All  Show:Clear all [x] Written[x] Templated[] Copied  Added by: [x] Urbano Albright, MD  [] Hover for details          Physical Medicine and Rehabilitation Consult Reason for Consult: Rehab Referring Physician: Dr. Lanis     HPI: [name] [name] is a 83 y.o. female with PMH of CAD, fibromyalgia,  depression, GERD, dementia, aneurysm s/p embolization who presented due to confusion and ambulation difficulty.  Wife CT head on 12/01/2023 showed acute intraparenchymal hematoma involving the superior frontal gyrus of the right frontal lobe, mild mass effect with effacement of the overlying sulci.  MRI of the brain 8/12 showed to acute/subacute intra-axial hemorrhages in the right hemisphere, superior frontal gyrus hematoma and smaller right inferior frontal gyrus hematoma.  Etiology felt to be hypertensive versus amyloid angiopathy.  Patient was found to have weakness in her left lower extremity.  SLP has been consulted for poststroke dysphagia.  Patient was seen by neurosurgery, continue supportive care per neurology recommended.  EEG with no epileptiform discharges.  Patient was evaluated by PT and OT and found to have functional deficits. She requires max to total assist with bathing and dressing, moderate assist with grooming, mod assist/max assist for supine to sitting, mod assist for step pivot transfers with rolling walker.  Patient found to be a good candidate for acute inpatient rehabilitation. Per chart review patient lives independently. 1 step to enter in 1 level home.  Using a walker at home.  Patient was not very active prior to admission, had poor balance.       Home: Home Living Family/patient expects to  be discharged to:: Private residence Living Arrangements: Alone Available Help at Discharge: Family Type of Home: House Home Access: Stairs to enter Secretary/administrator of Steps: 1 Home Layout: One level Bathroom Shower/Tub: Walk-in shower Home Equipment: Agricultural consultant (2 wheels), [employer] - single point Additional Comments: daughter recently got her RW due to imbalance  Functional History: Prior Function Prior Level of Function : Independent/Modified Independent Mobility Comments: daughter checked on her frequently; though usually completed ADL's and some IADL's independent Functional Status:  Mobility: Bed Mobility Overal bed mobility: Needs Assistance Bed Mobility: Supine to Sit Supine to sit: Mod assist, Max assist General bed mobility comments: increased time, step by step instructions needed and A for initiation for moving legs off EOB, lifting trunk and scooting hips Transfers Overall transfer level: Needs assistance Equipment used: Rolling walker (2 wheels) Transfers: Sit to/from Stand, Bed to chair/wheelchair/BSC Sit to Stand: Min assist Bed to/from chair/wheelchair/BSC transfer type:: Step pivot Step pivot transfers: Mod assist General transfer comment: using wide walker that was in her room, A for balance with sit to stand and mod cues for sequencing, assist for stepping to recliner, pt incontinent of stool then A for stepping to [facility] and back to recliner   ADL:   Cognition: Cognition Orientation Level: Oriented to person, Oriented to situation, Disoriented to time, Disoriented to place Cognition Arousal: Alert Behavior During Therapy: [facility] for tasks assessed/performed     Review of Systems  Constitutional:  Negative for chills and fever.  HENT:  Negative for hearing loss.   Eyes:  Negative for blurred vision and double vision.  Respiratory:  Negative for cough and shortness of breath.   Cardiovascular:  Negative for chest pain.  Gastrointestinal:  Negative for  abdominal pain, constipation, diarrhea, nausea and vomiting.  Genitourinary: Negative.  Negative for dysuria.  Musculoskeletal:  Positive for back pain (scoliosis). Negative for joint pain and neck pain.  Skin:  Negative for rash.  Neurological:  Positive for focal weakness. Negative for sensory change, speech change and headaches.  Psychiatric/Behavioral:  Negative for depression. The patient is not nervous/anxious.        Past Medical History:  Diagnosis Date   Acute kidney failure (HCC)      5 years ago   ACUTE KIDNEY FAILURE UNSPECIFIED 07/12/2009   Allergy     Anemia     Anxiety     Arthritis     Basal cell carcinoma     Blood clot in vein      states it was not deep vein   CAD (coronary artery disease)      Mild nonobstructive by CT   Cataract     Cerebral aneurysm without rupture 2021   Chronic kidney disease      stones   Depression      around the time of her husband's death   Diverticulosis of colon (without mention of hemorrhage)     Fibromyalgia     GERD (gastroesophageal reflux disease)     Headache     Hiatal hernia     History of kidney stones     Hyperlipidemia     Hypertension     Interstitial cystitis     Kidney stone     Osteopenia     PONV (postoperative nausea and vomiting)     Scoliosis     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    Qualifier: Diagnosis of  By: Jakie MD NOLIA Alm SAUNDERS    Vertigo               Past Surgical History:  Procedure Laterality Date   BLADDER TUMOR EXCISION   2001   CATARACT EXTRACTION        bilateral   CHOLECYSTECTOMY       COLOSTOMY       CYSTECTOMY   2001    benign   ESOPHAGEAL MANOMETRY N/A 07/13/2012    Procedure: ESOPHAGEAL MANOMETRY (EM);  Surgeon: Alm SAUNDERS Jakie, MD;  Location: [facility];  Service: Endoscopy;  Laterality: N/A;   HIATAL HERNIA REPAIR       IR ANGIO INTRA EXTRACRAN SEL INTERNAL CAROTID BILAT MOD SED   11/02/2019   IR ANGIO INTRA EXTRACRAN SEL INTERNAL CAROTID BILAT MOD SED   10/31/2020    IR ANGIO VERTEBRAL SEL VERTEBRAL BILAT MOD SED   12/17/2019   IR ANGIO VERTEBRAL SEL VERTEBRAL UNI L MOD SED   10/31/2020   IR ANGIO VERTEBRAL SEL VERTEBRAL UNI R MOD SED   11/02/2019   IR ANGIOGRAM FOLLOW UP STUDY   12/17/2019   IR ANGIOGRAM FOLLOW UP STUDY   12/17/2019   IR ANGIOGRAM FOLLOW UP STUDY   12/17/2019   IR ANGIOGRAM FOLLOW UP STUDY   12/17/2019   IR NEURO EACH ADD'L AFTER BASIC UNI LEFT (MS)   12/17/2019   IR TRANSCATH/EMBOLIZ   12/17/2019   LUMBAR DISC SURGERY        x 2 el[facility]ner   RADIOLOGY WITH ANESTHESIA N/A 12/17/2019    Procedure: 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;  Surgeon: Lanis Pupa, MD;  Location: [facility] OR;  Service: Radiology;  Laterality: N/A;   REVERSE SHOULDER ARTHROPLASTY Right 08/12/2014    Procedure: RIGHT REVERSE TOTAL SHOULDER ARTHROPLASTY;  Surgeon: Marcey Her, MD;  Location: [facility] OR;  Service: Orthopedics;  Laterality: Right;   TENDON REPAIR Right 11/2005    right shoulder   TOTAL KNEE ARTHROPLASTY Left      left   TOTAL KNEE ARTHROPLASTY Right 11/17/2015    Procedure: RIGHT TOTAL KNEE ARTHROPLASTY;  Surgeon: Lamar Collet, MD;  Location: [facility] ORS;  Service: Orthopedics;  Laterality: Right;   UPPER GASTROINTESTINAL ENDOSCOPY                 Family History  Problem Relation Age of Onset   Alzheimer's disease Mother     Lung cancer Father          tobacco   Kidney cancer Son     Stroke Son     Arthritis Son     Hypertension Brother          mom's side   Osteoporosis Sister     Hypertension Sister     Breast cancer Cousin     Heart disease Maternal Uncle          x 4   Heart disease Maternal Aunt          x 4   Diabetes Paternal Grandmother     Diabetes Paternal Uncle          x 2   Arthritis Daughter     Colon cancer Neg Hx          Social History:  reports that she has never smoked. She has never used smokeless tobacco. She reports that she does not currently use alcohol. She reports that she does not  use drugs. Allergies:  Allergies       Allergies  Allergen Reactions   Gadolinium Derivatives Hives      PER DR DERRY PT NEEDS 13HOUR PREP BEFORE FUTURE CONTRASTED MRI'S//KMS   Aspirin  Other (See Comments)      Acute renal failure    Lactose Intolerance (Gi) Other (See Comments)      Gi upset   Meloxicam        REACTION: acute renal failure  with hypovolemic trigger Hospital  3 2011 Pt states allergic to all NSAIDS    Metoprolol         No issues with Metoprolol  extended release   Sulfamethoxazole Hives            Medications Prior to Admission  Medication Sig Dispense Refill   amLODipine  (NORVASC ) 5 MG tablet TAKE 1 TABLET (5 MG TOTAL) BY MOUTH DAILY. 90 tablet 0   DULoxetine  (CYMBALTA ) 60 MG capsule TAKE 1 CAPSULE BY MOUTH TWICE A DAY (Patient taking differently: Take 60 mg by mouth daily.) 180 capsule 1   memantine  (NAMENDA ) 10 MG tablet Take half  tablet (5 mg at night) for 2 weeks, then increase to 1 tablet (5 mg) twice a day  Then increase to 1 tablet (10 mg)  twice a day 180 tablet 3   metoprolol  succinate (TOPROL -XL) 25 MG 24 hr tablet TAKE 1 TABLET (25 MG TOTAL) BY MOUTH DAILY. STOP THE LOPRESSOR  (TO AVOID TWICE A DAY DOSING.) 90 tablet 1   Probiotic Product (ALIGN PO) Take 1 tablet by mouth daily.        donepezil  (ARICEPT ) 10 MG tablet Take 10 mg by mouth at bedtime. (Patient not taking: Reported on 12/02/2023)       melatonin 5 MG  TABS Take 5 mg by mouth at bedtime. (Patient not taking: Reported on 12/02/2023)       omeprazole  (PRILOSEC) 40 MG capsule TAKE 1 CAPSULE BY MOUTH TWICE A DAY (Patient not taking: Reported on 12/02/2023) 180 capsule 3   rosuvastatin  (CRESTOR ) 40 MG tablet TAKE 1 TABLET BY MOUTH EVERY DAY (Patient not taking: Reported on 12/02/2023) 90 tablet 0   Suvorexant  (BELSOMRA ) 10 MG TABS Take 10 mg by mouth at bedtime. For sleep (Patient not taking: Reported on 12/02/2023) 30 tablet 0            Blood pressure (!) 143/87, pulse 75, temperature 99.1 F  (37.3 C), temperature source Oral, resp. rate 12, height 5' 3 (1.6 m), weight 53.5 kg, SpO2 99%. Physical Exam   General: No apparent distress HEENT: Head is normocephalic, atraumatic, sclera anicteric, oral mucosa a little dry Heart: Reg rate and rhythm. No murmurs rubs or gallops Chest: CTA bilaterally without wheezes, rales, or rhonchi; no distress Abdomen: Soft, non-tender, non-distended, bowel sounds positive. Extremities: No clubbing, cyanosis, or edema. Pulses are 2+ Psych: Pt's affect is appropriate. Pt is cooperative Skin: Clean and intact without signs of breakdown Neuro:     Mental Status: AAO to person, city not hospital, not date, not situation, memory deficits, can spell WORLD forwards not backwards, delayed responses Speech/Languate: Naming and repetition intact, fluent, follows simple commands CRANIAL NERVES: II: PERRL. Visual fields full III, IV, VI: EOM intact, no gaze preference or deviation V: normal sensation bilaterally VII: no asymmetry VIII: normal hearing to speech IX, X: normal palatal elevation XI: New asymmetry noted XII: Tongue midline     MOTOR: RUE: 4/5 Deltoid, 4/5 Biceps, 4/5 Triceps,4/5 Grip LUE: 4/5 Deltoid, 4/5 Biceps, 4/5 Triceps, 4/5 Grip RLE: HF 3/5, KE 4/5, ADF 3/5, APF 3/5 LLE: HF 4-/5, KE 4-/5, ADF 3/5, APF 3/5   SENSORY: Normal to touch all 4 extremities   Coordination: Normal finger to nose and heel to shin, no tremor, no dysmetria   MSK: interensic hand atrophy b/l, arthritic changes in b/l hands, healed TKA incisions Addressed patient's feet are plantarflexed, able to passively dorsiflex to about neutral       Lab Results Last 24 Hours       Results for orders placed or performed during the hospital encounter of 12/01/23 (from the past 24 hours)  Glucose, capillary     Status: None    Collection Time: 12/02/23 11:48 AM  Result Value Ref Range    Glucose-Capillary 82 70 - 99 mg/dL  Glucose, capillary     Status: Abnormal     Collection Time: 12/02/23  3:02 PM  Result Value Ref Range    Glucose-Capillary 168 (H) 70 - 99 mg/dL  Glucose, capillary     Status: Abnormal    Collection Time: 12/02/23  7:47 PM  Result Value Ref Range    Glucose-Capillary 130 (H) 70 - 99 mg/dL  Glucose, capillary     Status: Abnormal    Collection Time: 12/02/23 11:14 PM  Result Value Ref Range    Glucose-Capillary 108 (H) 70 - 99 mg/dL  Glucose, capillary     Status: Abnormal    Collection Time: 12/03/23  3:25 AM  Result Value Ref Range    Glucose-Capillary 118 (H) 70 - 99 mg/dL  Glucose, capillary     Status: Abnormal    Collection Time: 12/03/23  7:35 AM  Result Value Ref Range    Glucose-Capillary 115 (H) 70 - 99 mg/dL  Imaging Results (Last 48 hours)  ECHOCARDIOGRAM COMPLETE Result Date: 12/02/2023    ECHOCARDIOGRAM REPORT   Patient Name:   [name] [name] Date of Exam: 12/02/2023 Medical Rec #:  [id number]    Height:       63.0 in Accession #:    [id number]   Weight:       117.9 lb Date of Birth:  15-Jun-1940    BSA:          1.545 m Patient Age:    82 years     BP:           135/71 mmHg Patient Gender: F            HR:           81 bpm. Exam Location:  Inpatient Procedure: 2D Echo, Cardiac Doppler and Color Doppler (Both Spectral and Color            Flow Doppler were utilized during procedure). Indications:    Stroke  History:        Patient has no prior history of Echocardiogram examinations.                 Stroke; Risk Factors:Hypertension and Dyslipidemia.  Sonographer:    Therisa Crouch Referring Phys: [id number] [facility] IMPRESSIONS  1. Left ventricular ejection fraction, by estimation, is 65 to 70%. The left ventricle has normal function. The left ventricle has no regional wall motion abnormalities. Left ventricular diastolic parameters are consistent with Grade I diastolic dysfunction (impaired relaxation).  2. Right ventricular systolic function is normal. The right ventricular size is normal. Tricuspid  regurgitation signal is inadequate for assessing PA pressure.  3. The mitral valve is grossly normal. Trivial mitral valve regurgitation. No evidence of mitral stenosis.  4. The aortic valve is tricuspid. Aortic valve regurgitation is not visualized. No aortic stenosis is present.  5. The inferior vena cava is normal in size with greater than 50% respiratory variability, suggesting right atrial pressure of 3 mmHg. Conclusion(s)/Recommendation(s): No intracardiac source of embolism detected on this transthoracic study. Consider a transesophageal echocardiogram to exclude cardiac source of embolism if clinically indicated. FINDINGS  Left Ventricle: Left ventricular ejection fraction, by estimation, is 65 to 70%. The left ventricle has normal function. The left ventricle has no regional wall motion abnormalities. The left ventricular internal cavity size was normal in size. There is  no left ventricular hypertrophy. Left ventricular diastolic parameters are consistent with Grade I diastolic dysfunction (impaired relaxation). Right Ventricle: The right ventricular size is normal. No increase in right ventricular wall thickness. Right ventricular systolic function is normal. Tricuspid regurgitation signal is inadequate for assessing PA pressure. Left Atrium: Left atrial size was normal in size. Right Atrium: Right atrial size was normal in size. Pericardium: There is no evidence of pericardial effusion. Presence of epicardial fat layer. Mitral Valve: The mitral valve is grossly normal. Trivial mitral valve regurgitation. No evidence of mitral valve stenosis. Tricuspid Valve: The tricuspid valve is grossly normal. Tricuspid valve regurgitation is trivial. No evidence of tricuspid stenosis. Aortic Valve: The aortic valve is tricuspid. Aortic valve regurgitation is not visualized. No aortic stenosis is present. Aortic valve mean gradient measures 4.0 mmHg. Aortic valve peak gradient measures 8.0 mmHg. Aortic valve area, by  VTI measures 2.19 cm. Pulmonic Valve: The pulmonic valve was grossly normal. Pulmonic valve regurgitation is not visualized. No evidence of pulmonic stenosis. Aorta: The aortic root and ascending aorta are structurally normal, with no evidence of dilitation.  Venous: The right lower pulmonary vein is normal. The inferior vena cava is normal in size with greater than 50% respiratory variability, suggesting right atrial pressure of 3 mmHg. IAS/Shunts: The atrial septum is grossly normal.  LEFT VENTRICLE PLAX 2D LVIDd:         3.40 cm   Diastology LVIDs:         2.10 cm   LV e' medial:    5.55 cm/s LV PW:         1.10 cm   LV E/e' medial:  8.1 LV IVS:        1.00 cm   LV e' lateral:   6.20 cm/s LVOT diam:     1.90 cm   LV E/e' lateral: 7.3 LV SV:         61 LV SV Index:   40 LVOT Area:     2.84 cm  RIGHT VENTRICLE          IVC RV Basal diam:  2.80 cm  IVC diam: 1.30 cm TAPSE (M-mode): 1.8 cm LEFT ATRIUM             Index LA diam:        2.80 cm 1.81 cm/m LA Vol (A2C):   21.7 ml 14.05 ml/m LA Vol (A4C):   43.3 ml 28.03 ml/m LA Biplane Vol: 31.3 ml 20.26 ml/m  AORTIC VALVE AV Area (Vmax):    2.17 cm AV Area (Vmean):   2.21 cm AV Area (VTI):     2.19 cm AV Vmax:           141.00 cm/s AV Vmean:          92.700 cm/s AV VTI:            0.280 m AV Peak Grad:      8.0 mmHg AV Mean Grad:      4.0 mmHg LVOT Vmax:         108.00 cm/s LVOT Vmean:        72.300 cm/s LVOT VTI:          0.216 m LVOT/AV VTI ratio: 0.77  AORTA Ao Root diam: 2.80 cm Ao Asc diam:  2.70 cm MITRAL VALVE MV Area (PHT): 4.83 cm    SHUNTS MV Decel Time: 157 msec    Systemic VTI:  0.22 m MV E velocity: 45.10 cm/s  Systemic Diam: 1.90 cm MV A velocity: 79.10 cm/s MV E/A ratio:  0.57 Darryle Decent MD Electronically signed by Darryle Decent MD Signature Date/Time: 12/02/2023/3:14:32 PM    Final     EEG adult Result Date: 12/02/2023 Shelton Arlin KIDD, MD     12/02/2023 12:38 PM Patient Name: MAUDINE KLUESNER MRN: [id number] Epilepsy Attending: Arlin KIDD Shelton  Referring Physician/Provider: Vanessa Robert, MD Date: 12/02/2023 Duration: 22.40 seconds Patient history: 83 y.o. female with hx of dementia, GERD, hypertension, CAD, fibromyalgia, basilar tip aneurysm s/p embolization in 2021 who is brought in with about a week and a half history of confusion and difficulty with ambulation. EEG to evaluate for seizure. Level of alertness: Awake AEDs during EEG study: None Technical aspects: This EEG study was done with scalp electrodes positioned according to the 10-20 International system of electrode placement. Electrical activity was reviewed with band pass filter of 1-70Hz , sensitivity of 7 uV/mm, display speed of 69mm/sec with a 60Hz  notched filter applied as appropriate. EEG data were recorded continuously and digitally stored.  Video monitoring was available and reviewed as appropriate. Description: The posterior dominant  rhythm consists of 8 Hz activity of moderate voltage (25-35 uV) seen predominantly in posterior head regions, symmetric and reactive to eye opening and eye closing. Hyperventilation and photic stimulation were not performed.   IMPRESSION: This study is within normal limits. No seizures or epileptiform discharges were seen throughout the recording. A normal interictal EEG does not exclude the diagnosis of epilepsy. Arlin MALVA Krebs    CT ANGIO HEAD NECK W WO CM Result Date: 12/02/2023 EXAM: CTA Head and Neck with Intravenous Contrast. CT Head without Contrast. CLINICAL HISTORY: Neuro deficit, acute, stroke suspected; Stroke, hemorrhagic; follow up non contrast CT Head and a CT Angio of head and neck. TECHNIQUE: Axial CTA images of the head and neck performed with intravenous contrast. MIP reconstructed images were created and reviewed. Axial computed tomography images of the head/brain performed without intravenous contrast. Note: Per PQRS, the description of internal carotid artery percent stenosis, including 0 percent or normal exam, is based on  [place] American Symptomatic Carotid Endarterectomy Trial (NASCET) criteria. Dose reduction technique was used including one or more of the following: automated exposure control, adjustment of mA and kV according to patient size, and/or iterative reconstruction. CONTRAST: 75 mL of iohexol  (OMNIPAQUE ) 350 MG/ML injection. COMPARISON: MRA of the head dated 12/02/2023 and a conventional cerebral arteriogram dated 12/01/2020. FINDINGS: CT HEAD: BRAIN: No acute intraparenchymal hemorrhage. No mass lesion. No CT evidence for acute territorial infarct. No midline shift or extra-axial collection. VENTRICLES: No hydrocephalus. ORBITS: The orbits are unremarkable. SINUSES AND MASTOIDS: The paranasal sinuses and mastoid air cells are clear. CTA NECK: COMMON CAROTID ARTERIES: No significant stenosis. No dissection or occlusion. Moderate calcific plaque present within the aortic arch which appears normal in caliber. There is 3-vessel origin of the great arteries. INTERNAL CAROTID ARTERIES: No stenosis by NASCET criteria. No dissection or occlusion. The right internal carotid artery is tortuous. The left internal carotid artery is also mildly tortuous. Mild-to-moderate calcific plaque within the right and left carotid bulbs, but no associated stenosis. VERTEBRAL ARTERIES: No significant stenosis. No dissection or occlusion. The vertebral arteries are codominant and normal in caliber throughout their respective courses. CTA HEAD: ANTERIOR CEREBRAL ARTERIES: No significant stenosis. No occlusion. 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 MIDDLE CEREBRAL ARTERIES: No significant stenosis. No occlusion. No aneurysm. Mild atheromatous irregularity of the M1 segments, but no flow-limiting stenosis. POSTERIOR CEREBRAL ARTERIES: No significant stenosis. No occlusion. No aneurysm. The right posterior cerebral artery is normal in caliber and unremarkable. Moderate calcific  atheromatous disease at the junction of the left P1 and P2 segments, causing moderate-to-severe focal stenosis, as demonstrated on the previous MRI. BASILAR ARTERY: No significant stenosis. No occlusion. Status post coiling of the basilar tip. There is calcific atheromatous disease within the distal basilar artery, causing mild-to-moderate stenosis, less than 50%. No apparent residual or recurrent aneurysm. OTHER: There is moderate calcific plaque within the carotid siphons, with no flow-limiting stenosis. Moderate spray artifact from embolization coils within the tip of the basilar artery largely obscuring the anterior communicating artery. SOFT TISSUES: There appears to be a solid nodule within the right lobe of the thyroid  measuring approximately 11 x 8 mm. According to the American Thyroid  Association guidelines, the finding is consistent with an incidental thyroid  nodule >1 cm and the recommendation is dedicated thyroid  ultrasound and serum TSH measurement. BONES: There is degenerative endolisthesis present at C3-4. There is also reversal of the normal cervical lordosis and diffuse multilevel chronic degenerative disc  disease. LIMITATIONS/ARTIFACTS: There is free artifact obscuring and distorting the lower neck. IMPRESSION: 1. No acute intracranial hemorrhage or ischemic change. 2. Status post coiling of the basilar tip with no apparent residual or recurrent aneurysm. 3. 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 4. Moderate-to-severe focal stenosis at the junction of the left P1 and P2 segments, as demonstrated on the previous MRI. 5. Incidental solid nodule within the right lobe of the thyroid  measuring approximately 11 x 8 mm. Recommend dedicated thyroid  ultrasound and serum TSH measurement. Electronically signed by: evalene coho 12/02/2023 10:10 AM EDT RP Workstation: [id number]    MR ANGIO HEAD WO CONTRAST Result Date:  12/02/2023 CLINICAL DATA:  83 year old female with superior right frontal lobe hemorrhage on Head CT yesterday. Neurologic deficit. 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 EXAM: MRA HEAD WITHOUT CONTRAST TECHNIQUE: Angiographic images of the Circle of Willis were acquired using MRA technique without intravenous contrast. COMPARISON:  Brain MRI today reported separately. No prior CT or MR angiogram. FINDINGS: Anterior circulation: Antegrade flow signal in both ICA siphons. Ophthalmic and right posterior communicating artery origins appear normal. Left posterior communicating artery diminutive or absent. Mild ICA siphon irregularity without stenosis. Patent carotid termini, MCA and ACA origins. Mildly ectatic anterior communicating artery and difficult to exclude a small 2 mm Acomm aneurysm as seen on series 4, image 63. This could alternatively be infundibulum. Tortuous proximal A2 segments. Visible ACA branches otherwise within normal limits. Left MCA M1 bifurcates early without stenosis. Right MCA M1 and bifurcation appear patent without stenosis. Visible MCA branches are within normal limits. Posterior circulation: Antegrade flow in the posterior circulation. Codominant distal vertebral arteries and vertebrobasilar junction appear patent without stenosis. Patent PICA origins. Patent basilar artery without stenosis. Artifact at the basilar tip from embolization coil pack. Small area of 2 mm flow signal along the inferior surface of the coil pack, series 4, image 64 and series 455, image 9. Adjacent PCA origins remain patent. Right posterior communicating artery is visible. SCA origins are patent. Right PCA branches are within normal limits. There is a short segment moderate to severe stenosis of the left PCA P1/P2 junction (series 454, image 6). With maintained distal left PCA branch flow. Anatomic variants: None significant. Other: Brain MRI today is reported separately.  IMPRESSION: 1. 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 2. Difficult to exclude a small 2 mm A-comm aneurysm, but might an infundibulum. 3. No large vessel occlusion. 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 4. Abnormal Brain MRI today reported separately. Electronically Signed   By: VEAR Hurst M.D.   On: 12/02/2023 09:23    MR BRAIN WO CONTRAST Result Date: 12/02/2023 CLINICAL DATA:  83 year old female with superior right frontal lobe hemorrhage on Head CT yesterday. Neurologic deficit. 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 EXAM: MRI HEAD WITHOUT CONTRAST TECHNIQUE: Multiplanar, multiecho pulse sequences of the brain and surrounding structures were obtained without intravenous contrast. COMPARISON:  Head CT yesterday.  Brain MRI 11/27/2022. FINDINGS: Brain: Significant streak artifact through the anterior cranial fossa on CT yesterday from large basilar tip aneurysm coil pack. MRI reveals similar appearing right inferior frontal gyrus and superior frontal gyrus intra-axial hemorrhage, with T1 and T2 signal heterogeneity (series 9, image 45 in the right inferior frontal gyrus. That hematoma is  17 x 26 by 21 mm (AP by transverse by CC, volume estimated at 5 mL) where as the left superior frontal gyrus hematoma visible by CT is 38 x 22 x 31 mm (AP by transverse by CC, volume estimated at 13 mL) and extends to the superior surface of the brain. Comparing across modalities, neither hemorrhage appears significantly changed from the CT yesterday. Mild regional T2 and FLAIR hyperintense edema at both sites. No extra-axial or intraventricular blood is identified. DWI susceptibility at both sites with no other restricted diffusion suggestive of acute ischemia. No significant midline shift or intracranial mass effect, basilar cisterns remain normal. Scattered chronic  microhemorrhages in the left parietal and occipital lobes also appear increased from the SWI last year. Curvilinear chronic hemosiderin in the inferior left cerebellum is stable. Elsewhere confluent bilateral cerebral white matter T2 and FLAIR hyperintensity has not significantly changed. Deep gray nuclei T2 heterogeneity appears stable. Negative pituitary and cervicomedullary junction. Vascular: Major intracranial vascular flow voids are preserved. Heterogeneity at the basilar artery tip appears stable from the MRI last year. Skull and upper cervical spine: Stable. Background bone marrow signal is within normal limits. Sinuses/Orbits: Stable, negative. Other: Mastoids are clear. IMPRESSION: 1. MRI reveals two acute/subacute intra-axial hemorrhages in the right hemisphere: Known superior frontal gyrus hematoma (13 mL stable since CT yesterday), and a smaller right inferior frontal gyrus hematoma (5 mL) which was obscured on CT by basilar tip coil pack artifact. Underlying micro-hemorrhages in the left parietal and occipital lobes appear increased from MRI last year. 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 2. Mild associated cerebral edema with no significant intracranial mass effect or other complicating features. 3. No other acute intracranial abnormality. Previously treated basilar tip aneurysm. Advanced chronic white matter disease. Electronically Signed   By: VEAR Hurst M.D.   On: 12/02/2023 09:16    CT Head Wo Contrast Addendum Date: 12/01/2023 ADDENDUM REPORT: 12/01/2023 23:57 ADDENDUM: These results were called by telephone at the time of interpretation on 12/01/2023 at 11:56 pm to provider April Palumbo, MD, who verbally acknowledged these results. Electronically Signed   By: Dorethia Molt M.D.   On: 12/01/2023 23:57    Result Date: 12/01/2023 CLINICAL DATA:  Mental status change, unknown cause, dementia EXAM: CT HEAD WITHOUT CONTRAST TECHNIQUE: Contiguous  axial images were obtained from the base of the skull through the vertex without intravenous contrast. RADIATION DOSE REDUCTION: This exam was performed according to the departmental dose-optimization program which includes automated exposure control, adjustment of the mA and/or kV according to patient size and/or use of iterative reconstruction technique. COMPARISON:  MRI 11/27/2022 FINDINGS: Brain: Acute intraparenchymal hematoma is seen involving the superior frontal gyrus of the right frontal lobe measuring 3.1 x 3.6 x 1.5 cm in greatest dimension (23/6, 22/2). Small amount of surrounding cytotoxic. Mild mass effect with effacement of the overlying sulci. No associated subdural or subarachnoid hemorrhage. No intraventricular hemorrhage. No midline shift. Significant streak artifact related to embolization coils in the region of the basilar tip. Mild parenchymal volume loss is commensurate with the patient's age. Extensive periventricular white matter changes are identified, stable since prior examination, possibly related to small vessel ischemia. Ventricular size is normal. Cerebellum is unremarkable. Vascular: No hyperdense vessel or unexpected calcification. Embolization coils noted within the basilar tip. Skull: Normal. Negative for fracture or focal lesion. Sinuses/Orbits: No acute finding. Other: Mastoid air cells and middle ear cavities are clear. IMPRESSION: 1. Acute intraparenchymal hematoma involving the superior  frontal gyrus of the right frontal lobe measuring 3.1 x 3.6 x 1.5 cm in greatest dimension. Mild mass effect with effacement of the overlying sulci. No midline shift. 2. Extensive periventricular white matter changes, stable since prior examination, possibly related to small vessel ischemia. 3. Embolization coils in the region of the basilar tip. Electronically Signed: By: Dorethia Molt M.D. On: 12/01/2023 23:50       Assessment/Plan: Diagnosis: Intraparenchymal hemorrhage right frontal  lobe, superior frontal gyrus Does the need for close, 24 hr/day medical supervision in concert with the patient's rehab needs make it unreasonable for this patient to be served in a less intensive setting? Yes Co-Morbidities requiring supervision/potential complications:  -Hx of CVA, HTN, HLD, dysphagia, CAD, Fibromyalgia, GERD, depression, dementia Due to bladder management, bowel management, safety, skin/wound care, disease management, medication administration, pain management, and patient education, does the patient require 24 hr/day rehab nursing? Yes Does the patient require coordinated care of a physician, rehab nurse, therapy disciplines of PT/OT to address physical and functional deficits in the context of the above medical diagnosis(es)? Yes Addressing deficits in the following areas: balance, endurance, locomotion, strength, transferring, bowel/bladder control, bathing, dressing, feeding, grooming, toileting, cognition, speech, language, swallowing, and psychosocial support Can the patient actively participate in an intensive therapy program of at least 3 hrs of therapy per day at least 5 days per week? Yes The potential for patient to make measurable gains while on inpatient rehab is excellent Anticipated functional outcomes upon discharge from inpatient rehab are supervision and min assist  with PT, supervision and min assist with OT, supervision and min assist with SLP. Estimated rehab length of stay to reach the above functional goals is: 12-16 Anticipated discharge destination: Home Overall Rehab/Functional Prognosis: excellent   POST ACUTE RECOMMENDATIONS: This patient's condition is appropriate for continued rehabilitative care in the following setting: CIR Patient has agreed to participate in recommended program. Yes Note that insurance prior authorization may be required for reimbursement for recommended care.   Comment: I think patient would be a good candidate for CIR.  Would  need to confirm disposition/family assistance after discharge.  Rehab coordinator follow-up     MEDICAL RECOMMENDATIONS: Consider PRAFO b/l LE     I have personally performed a face to face diagnostic evaluation of this patient. Additionally, I have examined the patient's medical record including any pertinent labs and radiographic images.     Thanks,   Murray Collier, MD 12/03/2023           Routing History

## 2023-12-05 NOTE — Progress Notes (Signed)
 [name] Arthea DASEN, MD  Physician Physical Medicine and Rehabilitation   PMR Pre-admission    Signed   Date of Service: 12/03/2023  4:03 PM  Related encounter: ED to Hosp-Admission (Current) from 12/01/2023 in [address] [facility]   Signed     Expand All Collapse All  Show:Clear all [x] Written[x] Templated[x] Copied  Added by: [x] Alison Heron MATSU, RN[x] Warren, Caitlin E, PT  [] Hover for details PMR Admission Coordinator Pre-Admission Assessment   Patient: [name] [name] is an 83 y.o., female MRN: [id number] DOB: 1940/05/30 Height: 5' 3 (160 cm) Weight: 53.5 kg                                                                                                                                                  Insurance Information HMO: yes    PPO:      PCP:      IPA:      80/20:      OTHER:  PRIMARY: [employer]      Policy#: [id number]       Subscriber: pt CM Name: Cassie      Phone#: [phone] ext [phone]     Fax#: [phone] Pre-Cert#: [id number] approved for 7 days 8/15 until 8/22 f/u with [place] phone [phone] ext [phone] fax [phone]      Employer:  Benefits:  Phone #: [phone]     Name:  Eff. Date: 04/23/23     Deduct: none      Out of Pocket Max: $9350      Life Max: none  CIR: $399 co pay per day days 1 until 6      SNF: no co pay per day days1 until 20; $214 co pay per day days 21 until 100 Outpatient: $ 25 per visit     Co-Pay: none Home Health: 100%      Co-Pay:  DME: 80%     Co-Pay: 20% Providers:  SECONDARY:       Policy#:       Phone#:    Artist:       Phone#:    The Engineer, materials Information Summary" for patients in Inpatient Rehabilitation Facilities with attached "Privacy Act Statement-Health Care Records" was provided and verbally reviewed with: Family   Emergency Contact Information Contact Information       Name Relation Home Work Mobile    Lowe,Sherry Daughter [phone]   [phone]     [name] [phone]             Other Contacts   None on File      Current Medical History  Patient Admitting Diagnosis: ICH    History of Present Illness: Pt is an 83 y/o female with PMH of CAD, CKD, HTN, fibromyalgia, basilar tip aneurysm s/p embolization in 2021, and interstitial cystitis who presented to [facility] on  8/12 with difficulty ambulating.  CT head showed acute right frontal hemorrhage with mild mass effect with effacement of the overlying sulci.  No MLS.  CTA head/neck no acute hemorrhage or ischemic change but did show 2 mm aneurysm in the A[place] artery as well as moderate to severe focal stenosis at the junction of the left P1 and P2 segment.  MRI confirmed intra axial hemorrhages in the right hemisphere including the superior frontal gyrus, right inferior frontal gyrus, and underlying microhemorrhages in the left parietal and frontal lobes which appear increased from previous imaging.  ECHO showed EF 60-70%.  She did require cleviprex  for tight BP control, goal <160.  Therapy evaluations completed and pt was recommended for CIR due to functional decline.    Complete NIHSS TOTAL: 0 Glasgow Coma Scale Score: 14   Patient's medical record from [facility] has been reviewed by the rehabilitation admission coordinator and physician.   Past Medical History      Past Medical History:  Diagnosis Date   Acute kidney failure (HCC)      5 years ago   ACUTE KIDNEY FAILURE UNSPECIFIED 07/12/2009   Allergy     Anemia     Anxiety     Arthritis     Basal cell carcinoma     Blood clot in vein      states it was not deep vein   CAD (coronary artery disease)      Mild nonobstructive by CT   Cataract     Cerebral aneurysm without rupture 2021   Chronic kidney disease      stones   Depression      around the time of her husband's death   Diverticulosis of colon (without mention of hemorrhage)     Fibromyalgia     GERD (gastroesophageal reflux disease)     Headache      Hiatal hernia     History of kidney stones     Hyperlipidemia     Hypertension     Interstitial cystitis     Kidney stone     Osteopenia     PONV (postoperative nausea and vomiting)     Scoliosis     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    Qualifier: Diagnosis of  By: Jakie MD NOLIA Alm SAUNDERS    Vertigo            Has the patient had major surgery during 100 days prior to admission? No   Family History  family history includes Alzheimer's disease in her mother; Arthritis in her daughter and son; Breast cancer in her cousin; Diabetes in her paternal grandmother and paternal uncle; Heart disease in her maternal aunt and maternal uncle; Hypertension in her brother and sister; Kidney cancer in her son; Lung cancer in her father; Osteoporosis in her sister; Stroke in her son.     Current Medications   Current Medications    Current Facility-Administered Medications:    acetaminophen  (TYLENOL ) tablet 650 mg, 650 mg, Oral, Q4H PRN **OR** acetaminophen  (TYLENOL ) 160 MG/5ML solution 650 mg, 650 mg, Per Tube, Q4H PRN **OR** acetaminophen  (TYLENOL ) suppository 650 mg, 650 mg, Rectal, Q4H PRN, Khaliqdina, Salman, MD   amLODipine  (NORVASC ) tablet 10 mg, 10 mg, Oral, Daily, Gonfa, Taye T, MD, 10 mg at 12/05/23 9070   Chlorhexidine  Gluconate Cloth 2 % PADS 6 each, 6 each, Topical, Daily, Khaliqdina, Salman, MD, 6 each at 12/04/23 2148   DULoxetine  (CYMBALTA ) DR capsule 60 mg, 60 mg, Oral, Daily, Sethi, Pramod  S, MD, 60 mg at 12/05/23 0927   heparin  injection 5,000 Units, 5,000 Units, Subcutaneous, Q8H, Sethi, Pramod S, MD, 5,000 Units at 12/05/23 9447   hydrALAZINE  (APRESOLINE ) injection 10 mg, 10 mg, Intravenous, Q6H PRN, Lehner, Erin C, NP   labetalol  (NORMODYNE ) injection 10 mg, 10 mg, Intravenous, Q2H PRN, Lehner, Erin C, NP, 10 mg at 12/03/23 0116   memantine  (NAMENDA ) tablet 10 mg, 10 mg, Oral, BID, Sethi, Pramod S, MD, 10 mg at 12/05/23 9072   metoprolol  succinate (TOPROL -XL) 24 hr  tablet 12.5 mg, 12.5 mg, Oral, Daily, Gonfa, Taye T, MD, 12.5 mg at 12/05/23 9072   Oral care mouth rinse, 15 mL, Mouth Rinse, PRN, Khaliqdina, Salman, MD   rosuvastatin  (CRESTOR ) tablet 20 mg, 20 mg, Oral, Daily, Gonfa, Taye T, MD, 20 mg at 12/05/23 9070   senna-docusate (Senokot-S) tablet 1 tablet, 1 tablet, Oral, BID, Khaliqdina, Salman, MD, 1 tablet at 12/05/23 9072     Patients Current Diet:  Diet Order                  Diet - low sodium heart healthy             Diet regular Room service appropriate? Yes; Fluid consistency: Thin  Diet effective now                         Precautions / Restrictions Precautions Precautions: Fall Precaution/Restrictions Comments: SBP<160 Restrictions Weight Bearing Restrictions Per Provider Order: No    Has the patient had 2 or more falls or a fall with injury in the past year? Unknown   Prior Activity Level Household: home at baseline with assist for cooking/housework, but mod I with mobility/ADLS, daughter comes daily after work to spend time with her, cook dinner, etc.   Prior Functional Level Prior Function Prior Level of Function : Needs assist Mobility Comments: assisted for meal prep and housekeeping, pt does not drive ADLs Comments: independent in basic self care   Self Care: Did the patient need help bathing, dressing, using the toilet or eating?  Independent   Indoor Mobility: Did the patient need assistance with walking from room to room (with or without device)? Independent   Stairs: Did the patient need assistance with internal or external stairs (with or without device)? Independent   Functional Cognition: Did the patient need help planning regular tasks such as shopping or remembering to take medications? Needed some help   Patient Information Are you of Hispanic, Latino/a,or Spanish origin?: A. No, not of Hispanic, Latino/a, or Spanish origin What is your race?: A. White Do you need or want an interpreter to  communicate with a doctor or health care staff?: 0. No Patient information obtained via proxy : chart   Patient's Response To:  Health Literacy and Transportation Is the patient able to respond to health literacy and transportation needs?: No Health Literacy - How often do you need to have someone help you when you read instructions, pamphlets, or other written material from your doctor or pharmacy?: Patient unable to respond In the past 12 months, has lack of transportation kept you from medical appointments or from getting medications?: No In the past 12 months, has lack of transportation kept you from meetings, work, or from getting things needed for daily living?: No Health Literacy and Transportation obtained via proxy: per daughter   Journalist, newspaper / Equipment Home Equipment: Agricultural consultant (2 wheels), [employer] - single point   Prior  Device Use: Indicate devices/aids used by the patient prior to current illness, exacerbation or injury? None of the above   Current Functional Level Cognition   Arousal/Alertness: Awake/alert Overall Cognitive Status: Impaired/Different from baseline Orientation Level: Oriented to person, Oriented to place, Disoriented to time, Disoriented to situation Attention: Sustained Sustained Attention: Appears intact Memory: Impaired Memory Impairment:  (0/4 words 1/4 correct with category cue, 2/4 incorrect with choice) Awareness: Impaired Awareness Impairment: Intellectual impairment, Anticipatory impairment, Emergent impairment Problem Solving: Impaired Problem Solving Impairment: Verbal basic (3/4 correct) Safety/Judgment: Impaired    Extremity Assessment (includes Sensation/Coordination)   Upper Extremity Assessment: Right hand dominant, Generalized weakness  Lower Extremity Assessment: Defer to PT evaluation     ADLs   Overall ADL's : Needs assistance/impaired Eating/Feeding: Set up, Sitting Grooming: Brushing hair, Moderate assistance,  Sitting Grooming Details (indicate cue type and reason): decreased thoroughness Upper Body Bathing: Maximal assistance, Sitting Lower Body Bathing: Total assistance, +2 for physical assistance, Sit to/from stand Upper Body Dressing : Maximal assistance, Sitting Lower Body Dressing: Total assistance, Bed level Toilet Transfer: +2 for physical assistance, Stand-pivot, Minimal assistance, Rolling walker (2 wheels), BSC/3in1 Toileting- Clothing Manipulation and Hygiene: Maximal assistance, +2 for physical assistance, Sit to/from stand Toileting - Clothing Manipulation Details (indicate cue type and reason): pt able to complete pericare with assist for standing balance and clothing management Functional mobility during ADLs: Moderate assistance, +2 for safety/equipment, Rolling walker (2 wheels)     Mobility   Overal bed mobility: Needs Assistance Bed Mobility: Supine to Sit Supine to sit: Min assist General bed mobility comments: cues for initiation and sequencing. minA for trunk elevation and scooting forward to EOB     Transfers   Overall transfer level: Needs assistance Equipment used: Rolling walker (2 wheels) Transfers: Sit to/from Stand Sit to Stand: Min assist, +2 physical assistance, +2 safety/equipment Bed to/from chair/wheelchair/BSC transfer type:: Step pivot Step pivot transfers: Mod assist, +2 physical assistance General transfer comment: Min A +2 progressing to min A +1 to stand from EOB and recliner. cues for hand placement     Ambulation / Gait / Stairs / Wheelchair Mobility   Ambulation/Gait Ambulation/Gait assistance: Min assist, +2 safety/equipment Gait Distance (Feet): 75 Feet (x2) Assistive device: Rolling walker (2 wheels) Gait Pattern/deviations: Decreased stride length, Shuffle, Trunk flexed, Step-through pattern, Drifts right/left General Gait Details: Pt demonstrates short steps with low foot clearance, but is able to briefly increase step length with frequent  cues. Cues for upright posture and assist for RW management due to L drift     Posture / Balance Dynamic Sitting Balance Sitting balance - Comments: sitting EOB Balance Overall balance assessment: Needs assistance Sitting-balance support: Bilateral upper extremity supported, Feet supported Sitting balance-Leahy Scale: Fair Sitting balance - Comments: sitting EOB Standing balance support: Bilateral upper extremity supported, During functional activity, Reliant on assistive device for balance Standing balance-Leahy Scale: Poor Standing balance comment: with RW support. Initial posterior bias requiring assist, but improves during ambulation     Special considerations/ Life events Diabetic management A1C 5.5        Previous Home Environment (from acute therapy documentation) Living Arrangements: Alone  Lives With: Alone Available Help at Discharge: Family, Available PRN/intermittently Type of Home: House Home Layout: One level Home Access: Stairs to enter Secretary/administrator of Steps: 1 Bathroom Shower/Tub: Psychologist, counselling Home Care Services: No Additional Comments: daughter recently got her RW due to imbalance   Discharge Living Setting Plans for Discharge Living Setting: Patient's home (compilation of  family and hired caregivers) Type of Home at Discharge: House Discharge Home Layout: One level Discharge Home Access: Stairs to enter Entrance Stairs-Rails: None Entrance Stairs-Number of Steps: 1 Discharge Bathroom Shower/Tub: Walk-in shower Discharge Bathroom Toilet: Handicapped height Discharge Bathroom Accessibility: Yes How Accessible: Accessible via walker Does the patient have any problems obtaining your medications?: No   Social/Family/Support Systems Anticipated Caregiver: daughter, Joen Anticipated Caregiver's Contact Information: [phone] Ability/Limitations of Caregiver: Joen works, working on pulling together initial 24/7 supervision/assist for  discharge (family, friends, hired caregivers) Engineer, structural Availability: 24/7 Discharge Plan Discussed with Primary Caregiver: Yes Is Caregiver In Agreement with Plan?: Yes Does Caregiver/Family have Issues with Lodging/Transportation while Pt is in Rehab?: No     Goals Patient/Family Goal for Rehab: PT/OT supervision to min assist, SLP min assist Expected length of stay: 12-16 days Additional Information: Discharge plan: home with 24/7 assist from family and caregivers Pt/Family Agrees to Admission and willing to participate: Yes Program Orientation Provided & Reviewed with Pt/Caregiver Including Roles  & Responsibilities: Yes     Decrease burden of Care through IP rehab admission: n/a     Possible need for SNF placement upon discharge: Not anticipated.  Plan for discharge back to patient's home with 24/7 supervision/assist from daughter, other family and hired caregivers.       Patient Condition: This patient's condition remains as documented in the consult dated 12/03/23, in which the Rehabilitation Physician determined and documented that the patient's condition is appropriate for intensive rehabilitative care in an inpatient rehabilitation facility. Will admit to inpatient rehab pending insurance approval today.   Preadmission Screen Completed By:  Alison Heron Lot, RN, DPT 12/05/2023 12:16 PM ______________________________________________________________________   Discussed status with Dr. Babs on 12/05/23 at 1217 and received approval for admission today.   Admission Coordinator:  Alison Heron Lot, time 8782 Date 12/05/23            Revision History

## 2023-12-05 NOTE — TOC Transition Note (Signed)
 Transition of Care [facility]) - Discharge Note   Patient Details  Name: [name] [name] MRN: [id number] Date of Birth: 1940/07/05  Transition of Care [facility]) CM/SW Contact:  Andrez JULIANNA George, RN Phone Number: 12/05/2023, 12:26 PM   Clinical Narrative:     Pt is discharging to CIR today. IP Care management signing off.   Final next level of care: IP Rehab Facility Barriers to Discharge: No Barriers Identified   Patient Goals and CMS Choice   CMS Medicare.gov Compare Post Acute Care list provided to:: Patient Choice offered to / list presented to : Patient      Discharge Placement                       Discharge Plan and Services Additional resources added to the After Visit Summary for   In-house Referral: Clinical Social Work Discharge Planning Services: CM Consult                                 Social Drivers of Health (SDOH) Interventions SDOH Screenings   Food Insecurity: No Food Insecurity (12/02/2023)  Housing: Low Risk  (12/02/2023)  Transportation Needs: No Transportation Needs (12/02/2023)  Utilities: Not At Risk (12/02/2023)  Alcohol Screen: Low Risk  (06/26/2021)  Depression (PHQ2-9): Low Risk  (02/21/2022)  Financial Resource Strain: Low Risk  (06/26/2021)  Physical Activity: Inactive (06/26/2021)  Social Connections: Moderately Integrated (12/02/2023)  Stress: No Stress Concern Present (06/26/2021)  Tobacco Use: Low Risk  (12/01/2023)     Readmission Risk Interventions     No data to display

## 2023-12-05 NOTE — Evaluation (Signed)
 Speech Language Pathology Evaluation Patient Details Name: [name] [name] MRN: [id number] DOB: 1940-09-12 Today's Date: 12/05/2023 Time: 8982-8960 SLP Time Calculation (min) (ACUTE ONLY): 22 min  Problem List:  Patient Active Problem List   Diagnosis Date Noted   Hemorrhagic stroke (HCC) 12/04/2023   Stroke (cerebrum) (HCC) 12/02/2023   Memory impairment 10/16/2022   Brain aneurysm 12/17/2019   Cerebral aneurysm 12/17/2019   Altered bowel function 12/15/2019   S/P knee replacement 11/17/2015   Primary osteoarthritis of right knee 11/17/2015   S/P shoulder replacement 08/12/2014   Primary osteoarthritis involving multiple joints 03/21/2014   CKD (chronic kidney disease), stage III (HCC) 12/16/2013   Medication management 09/17/2013   Visit for preventive health examination 06/16/2013   Hyperglycemia 06/16/2013   Agatston coronary artery calcium  score between 200 and 399 04/13/2012   Breast lump on left side at 9 o'clock position 02/12/2012   Skin lesion 02/12/2012   Vitamin B 12 deficiency 11/14/2011   Gynecomastia 07/18/2011   Fatigue 06/01/2011   Chest tightness or pressure 06/01/2011   Sleep difficulties 03/24/2011   Intentional underdosing of medication regimen by patient due to financial hardship 03/24/2011   ABDOMINAL BLOATING 10/10/2009   LUMP OR MASS IN BREAST 08/28/2009   ANEMIA 07/26/2009   ABDOMINAL PAIN OTHER SPECIFIED SITE 07/12/2009   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/2009   RENAL CALCULUS, HX OF 01/04/2009   IRON DEFICIENCY 11/15/2008   FIBROMYALGIA 11/15/2008   VITAMIN D  DEFICIENCY 10/10/2008   ANEMIA-B12 DEFICIENCY 05/10/2008   ADJ DISORDER WITH MIXED ANXIETY & DEPRESSED MOOD 05/05/2008   DYSPHAGIA UNSPECIFIED 05/02/2008   History of IBS 05/02/2008   VARICOSE VEINS, LOWER EXTREMITIES 04/12/2008   IBS 04/12/2008   OSTEOPENIA 04/12/2008   Adjustment disorder with depressed mood 02/16/2008   CHEST PAIN 02/16/2008   Hyperlipidemia 12/24/2006   ANXIETY 12/24/2006    Essential hypertension 12/24/2006   GERD 12/24/2006   HIATAL HERNIA 12/24/2006   Osteoarthritis 12/24/2006   Past Medical History:  Past Medical History:  Diagnosis Date   Acute kidney failure (HCC)    5 years ago   ACUTE KIDNEY FAILURE UNSPECIFIED 07/12/2009   Allergy    Anemia    Anxiety    Arthritis    Basal cell carcinoma    Blood clot in vein    states it was not deep vein   CAD (coronary artery disease)    Mild nonobstructive by CT   Cataract    Cerebral aneurysm without rupture 2021   Chronic kidney disease    stones   Depression    around the time of her husband's death   Diverticulosis of colon (without mention of hemorrhage)    Fibromyalgia    GERD (gastroesophageal reflux disease)    Headache    Hiatal hernia    History of kidney stones    Hyperlipidemia    Hypertension    Interstitial cystitis    Kidney stone    Osteopenia    PONV (postoperative nausea and vomiting)    Scoliosis    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   Qualifier: Diagnosis of  By: Jakie MD NOLIA Alm SAUNDERS    Vertigo    Past Surgical History:  Past Surgical History:  Procedure Laterality Date   BLADDER TUMOR EXCISION  2001   CATARACT EXTRACTION     bilateral   CHOLECYSTECTOMY     COLOSTOMY     CYSTECTOMY  2001   benign   ESOPHAGEAL MANOMETRY N/A 07/13/2012   Procedure: ESOPHAGEAL MANOMETRY (EM);  Surgeon:  Alm JONELLE Gander, MD;  Location: [facility];  Service: Endoscopy;  Laterality: N/A;   HIATAL HERNIA REPAIR     IR ANGIO INTRA EXTRACRAN SEL INTERNAL CAROTID BILAT MOD SED  11/02/2019   IR ANGIO INTRA EXTRACRAN SEL INTERNAL CAROTID BILAT MOD SED  10/31/2020   IR ANGIO VERTEBRAL SEL VERTEBRAL BILAT MOD SED  12/17/2019   IR ANGIO VERTEBRAL SEL VERTEBRAL UNI L MOD SED  10/31/2020   IR ANGIO VERTEBRAL SEL VERTEBRAL UNI R MOD SED  11/02/2019   IR ANGIOGRAM FOLLOW UP STUDY  12/17/2019   IR ANGIOGRAM FOLLOW UP STUDY  12/17/2019   IR ANGIOGRAM FOLLOW UP STUDY  12/17/2019   IR  ANGIOGRAM FOLLOW UP STUDY  12/17/2019   IR NEURO EACH ADD'L AFTER BASIC UNI LEFT (MS)  12/17/2019   IR TRANSCATH/EMBOLIZ  12/17/2019   LUMBAR DISC SURGERY     x 2 [facility]   RADIOLOGY WITH ANESTHESIA N/A 12/17/2019   Procedure: 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;  Surgeon: Lanis Pupa, MD;  Location[facility];  Service: Radiology;  Laterality: N/A;   REVERSE SHOULDER ARTHROPLASTY Right 08/12/2014   Procedure: RIGHT REVERSE TOTAL SHOULDER ARTHROPLASTY;  Surgeon: Marcey Her, MD;  Location: [facility] OR;  Service: Orthopedics;  Laterality: Right;   TENDON REPAIR Right 11/2005   right shoulder   TOTAL KNEE ARTHROPLASTY Left    left   TOTAL KNEE ARTHROPLASTY Right 11/17/2015   Procedure: RIGHT TOTAL KNEE ARTHROPLASTY;  Surgeon: Lamar Collet, MD;  Location: [facility]S;  Service: Orthopedics;  Laterality: Right;   UPPER GASTROINTESTINAL ENDOSCOPY     HPI:  Patient is an 83 y/o female admitted 12/02/23 with one and half weeks of confusion and difficulty ambulating.  Daughter brought her to the ED and pt found to have R frontal hemorrhage. PMH positive for CAD, CKD, GERD, arthritis, HTN, fibromyalgia, interstitial cystitis.   Assessment / Plan / Recommendation Clinical Impression  Pt who lives alone assessed and found to have overall moderate cognitive impairments given various parts of Cognistat. Oral motor abilities, speech intelligibility and language all within normal limits. She exhibited difficulty in memory- was unable to encode 4 words for immediate repetition for memory subtest and scored in the severe impairment. She also demonstrated mild difficulty with verbal problem solving, moderate impairments in awareness and orientation to diagnosis and time. Pt will benefit from ST on acute and at inpatient > 3 hours.    SLP Assessment  SLP Recommendation/Assessment: Patient needs continued Speech Language Pathology Services SLP Visit Diagnosis: Cognitive communication deficit  (R41.841)     Assistance Recommended at Discharge  Frequent or constant Supervision/Assistance  Functional Status Assessment Patient has had a recent decline in their functional status and demonstrates the ability to make significant improvements in function in a reasonable and predictable amount of time.  Frequency and Duration min 2x/week  2 weeks      SLP Evaluation Cognition  Overall Cognitive Status: Impaired/Different from baseline Arousal/Alertness: Awake/alert Orientation Level: Oriented to person;Oriented to place;Disoriented to time;Disoriented to situation Year:  (did not know) Month:  (did not know) Day of Week:  (did not know) Attention: Sustained Sustained Attention: Appears intact Memory: Impaired Memory Impairment:  (0/4 words 1/4 correct with category cue, 2/4 incorrect with choice) Awareness: Impaired Awareness Impairment: Intellectual impairment;Anticipatory impairment;Emergent impairment Problem Solving: Impaired Problem Solving Impairment: Verbal basic (3/4 correct) Safety/Judgment: Impaired       Comprehension  Auditory Comprehension Overall Auditory Comprehension: Appears within functional limits for tasks assessed Visual  Recognition/Discrimination Discrimination: Not tested Reading Comprehension Reading Status: Not tested    Expression Expression Primary Mode of Expression: Verbal Verbal Expression Overall Verbal Expression: Appears within functional limits for tasks assessed Initiation: No impairment Level of Generative/Spontaneous Verbalization: Sentence Repetition: No impairment Naming: No impairment Pragmatics: No impairment Written Expression Written Expression: Not tested   Oral / Motor  Oral Motor/Sensory Function Overall Oral Motor/Sensory Function: Within functional limits Motor Speech Overall Motor Speech: Appears within functional limits for tasks assessed Respiration: Within functional limits Phonation: Normal Resonance:  Within functional limits Articulation: Within functional limitis Intelligibility: Intelligible Motor Planning: Within functional limits Motor Speech Errors: Not applicable            Dustin Olam Bull 12/05/2023, 10:57 AM

## 2023-12-05 NOTE — Progress Notes (Signed)
 Physical Therapy Treatment Patient Details Name: [name] [name] MRN: [id number] DOB: 03/07/1941 Today's Date: 12/05/2023   History of Present Illness Patient is an 83 y/o female admitted 12/02/23 with one and half weeks of confusion and difficulty ambulating.  Daughter brought her to the ED and pt found to have R frontal hemorrhage.  PMH positive for CAD, CKD, GERD, arthritis, HTN, fibromyalgia, interstitial cystitis.    PT Comments  Pt received sitting EOB and agreeable to session. Linens noted to be soiled and pt incontinent of bowel upon standing and pt requests to use the bathroom. Pt requires increased cues and min A for obstacle negotiation due to decreased awareness and problem solving. Pt noted to drift to the L and almost run into objects. Pt unable to problem solve avoiding objects with cues and requires assist to guide RW. Pt reports having scoliosis, so suspect this contributes to drifting but pt demonstrates decreased awareness. Pt told room number during ambulation, but is unable to recall <5 mins later. Pt continues to benefit from PT services to progress toward functional mobility goals.    If plan is discharge home, recommend the following: A lot of help with bathing/dressing/bathroom;A lot of help with walking and/or transfers;Assistance with cooking/housework;Supervision due to cognitive status   Can travel by private vehicle        Equipment Recommendations  Other (comment) (TBA)    Recommendations for Other Services       Precautions / Restrictions Precautions Precautions: Fall Recall of Precautions/Restrictions: Impaired Precaution/Restrictions Comments: SBP<160 Restrictions Weight Bearing Restrictions Per Provider Order: No     Mobility  Bed Mobility               General bed mobility comments: Pt sitting EOB upon entry    Transfers Overall transfer level: Needs assistance Equipment used: Rolling walker (2 wheels) Transfers: Sit to/from Stand Sit to  Stand: Min assist           General transfer comment: STS from EOB and low toilet with min A and cues for hand placement    Ambulation/Gait Ambulation/Gait assistance: Min assist Gait Distance (Feet): 150 Feet Assistive device: Rolling walker (2 wheels) Gait Pattern/deviations: Decreased stride length, Shuffle, Trunk flexed, Step-through pattern, Drifts right/left       General Gait Details: Pt demonstrates short steps with low foot clearance, but is able to briefly increase step length with frequent cues. Cues for upright posture and assist for RW management and obstacle negotiation due to L drift and decreased problem solving   Stairs             Wheelchair Mobility     Tilt Bed    Modified Rankin (Stroke Patients Only) Modified Rankin (Stroke Patients Only) Pre-Morbid Rankin Score: Slight disability Modified Rankin: Moderately severe disability     Balance Overall balance assessment: Needs assistance Sitting-balance support: Bilateral upper extremity supported, Feet supported Sitting balance-Leahy Scale: Fair Sitting balance - Comments: sitting EOB   Standing balance support: Bilateral upper extremity supported, During functional activity, Reliant on assistive device for balance Standing balance-Leahy Scale: Poor Standing balance comment: with RW support and min A                            Communication Communication Communication: No apparent difficulties  Cognition Arousal: Alert Behavior During Therapy: WFL for tasks assessed/performed   PT - Cognitive impairments: Attention, Problem solving, Sequencing, Safety/Judgement, Memory  PT - Cognition Comments: Decreased awareness, delayed processing, and impaired memory Following commands: Impaired Following commands impaired: Follows one step commands inconsistently, Follows one step commands with increased time    Cueing Cueing Techniques: Verbal cues,  Gestural cues, Tactile cues  Exercises      General Comments        Pertinent Vitals/Pain Pain Assessment Pain Assessment: No/denies pain     PT Goals (current goals can now be found in the care plan section) Acute Rehab PT Goals Patient Stated Goal: return home with intermittent assist eventually PT Goal Formulation: With patient/family Time For Goal Achievement: 12/16/23 Progress towards PT goals: Progressing toward goals    Frequency    Min 3X/week       AM-PAC PT 6 Clicks Mobility   Outcome Measure  Help needed turning from your back to your side while in a flat bed without using bedrails?: A Little Help needed moving from lying on your back to sitting on the side of a flat bed without using bedrails?: A Little Help needed moving to and from a bed to a chair (including a wheelchair)?: A Little Help needed standing up from a chair using your arms (e.g., wheelchair or bedside chair)?: A Little Help needed to walk in hospital room?: A Little Help needed climbing 3-5 steps with a railing? : A Lot 6 Click Score: 17    End of Session Equipment Utilized During Treatment: Gait belt Activity Tolerance: Patient tolerated treatment well Patient left: in chair;with call bell/phone within reach;with family/visitor present Nurse Communication: Mobility status PT Visit Diagnosis: Other abnormalities of gait and mobility (R26.89);Other symptoms and signs involving the nervous system (R29.898);Apraxia (R48.2)     Time: 8851-8773 PT Time Calculation (min) (ACUTE ONLY): 38 min  Charges:    $Gait Training: 23-37 mins $Therapeutic Activity: 8-22 mins PT General Charges $$ ACUTE PT VISIT: 1 Visit                     Darryle George, PTA Acute Rehabilitation Services Secure Chat Preferred  [phone]    Darryle George 12/05/2023, 1:31 PM

## 2023-12-05 NOTE — H&P (Signed)
 Physical Medicine and Rehabilitation Admission H&P    Chief Complaint  Patient presents with   Aphasia  : HPI: [name] [name] is an 83 year old right right-handed female with history significant for dementia maintained on Namenda  , hyperlipidemia, GERD, hypertension, CAD, fibromyalgia, interstitial cystitis, basilar tip aneurysm status post embolization 2021, bilateral total knee replacement.  Per chart review patient lives alone.  1 level home one-step to entry.  She does use a rolling walker for balance.  Daughter with good support and provides meals.  Independent in basic self-care.  Patient does not drive.  Presented 12/01/2023 with progressive decrease in ambulation as well as intermittent bouts of confusion x 1 week.  Blood pressure 159/113.  Daughter reports that over the past week patient has been less talkative and stumbling with ambulation and was dragging her left leg.  She denied any recent fall.  Cranial CT scan showed acute intraparenchymal hematoma involving the superior frontal gyrus of the right frontal lobe measuring 3.1 x 3.6 x 1.5 cm in greatest dimension.  Mild mass effect with effacement of the overlying sulcal.  No midline shift.  Embolization coils in the region of the basilar tip.  MRI revealed 2 acute/subacute intra-axial hemorrhages in the right hemisphere.  Underlying microhemorrhages in the left parietal occipital lobes appeared somewhat increased from MRI of last year.  Mild associated cerebral edema no significant intracranial mass effect or complicating features.  MRA with no large vessel occlusion.  Admission chemistries unremarkable, urinalysis negative nitrite.  EEG negative for seizure.  Echo card gram ejection fraction of 65 to 70% grade 1 diastolic dysfunction.  No regional wall motion abnormalities.  Initially placed on Cleviprex  for blood pressure control.  Patient was cleared to begin subcutaneous heparin  for DVT prophylaxis 12/03/2023.  Tolerating a regular diet.   Therapy evaluations completed due to patient's decreased functional mobility was admitted for a comprehensive rehab program.  Review of Systems  Constitutional:  Negative for chills and fever.  HENT:  Negative for hearing loss.   Eyes:  Negative for blurred vision and double vision.  Respiratory:  Negative for cough, shortness of breath and wheezing.   Cardiovascular:  Positive for leg swelling. Negative for chest pain and palpitations.  Gastrointestinal:  Positive for constipation. Negative for heartburn and nausea.       GERD  Genitourinary:  Positive for urgency. Negative for dysuria, flank pain and hematuria.  Musculoskeletal:  Positive for back pain and myalgias.  Neurological:  Positive for focal weakness.       Vertigo  Psychiatric/Behavioral:  Positive for depression.        Anxiety  All other systems reviewed and are negative.  Past Medical History:  Diagnosis Date   Acute kidney failure (HCC)    5 years ago   ACUTE KIDNEY FAILURE UNSPECIFIED 07/12/2009   Allergy    Anemia    Anxiety    Arthritis    Basal cell carcinoma    Blood clot in vein    states it was not deep vein   CAD (coronary artery disease)    Mild nonobstructive by CT   Cataract    Cerebral aneurysm without rupture 2021   Chronic kidney disease    stones   Depression    around the time of her husband's death   Diverticulosis of colon (without mention of hemorrhage)    Fibromyalgia    GERD (gastroesophageal reflux disease)    Headache    Hiatal hernia    History  of kidney stones    Hyperlipidemia    Hypertension    Interstitial cystitis    Kidney stone    Osteopenia    PONV (postoperative nausea and vomiting)    Scoliosis    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   Qualifier: Diagnosis of  By: Jakie MD NOLIA Alm SAUNDERS    Vertigo    Past Surgical History:  Procedure Laterality Date   BLADDER TUMOR EXCISION  2001   CATARACT EXTRACTION     bilateral   CHOLECYSTECTOMY     COLOSTOMY      CYSTECTOMY  2001   benign   ESOPHAGEAL MANOMETRY N/A 07/13/2012   Procedure: ESOPHAGEAL MANOMETRY (EM);  Surgeon: Alm SAUNDERS Jakie, MD;  Location: [facility];  Service: Endoscopy;  Laterality: N/A;   HIATAL HERNIA REPAIR     IR ANGIO INTRA EXTRACRAN SEL INTERNAL CAROTID BILAT MOD SED  11/02/2019   IR ANGIO INTRA EXTRACRAN SEL INTERNAL CAROTID BILAT MOD SED  10/31/2020   IR ANGIO VERTEBRAL SEL VERTEBRAL BILAT MOD SED  12/17/2019   IR ANGIO VERTEBRAL SEL VERTEBRAL UNI L MOD SED  10/31/2020   IR ANGIO VERTEBRAL SEL VERTEBRAL UNI R MOD SED  11/02/2019   IR ANGIOGRAM FOLLOW UP STUDY  12/17/2019   IR ANGIOGRAM FOLLOW UP STUDY  12/17/2019   IR ANGIOGRAM FOLLOW UP STUDY  12/17/2019   IR ANGIOGRAM FOLLOW UP STUDY  12/17/2019   IR NEURO EACH ADD'L AFTER BASIC UNI LEFT (MS)  12/17/2019   IR TRANSCATH/EMBOLIZ  12/17/2019   LUMBAR DISC SURGERY     x 2 [facility]   RADIOLOGY WITH ANESTHESIA N/A 12/17/2019   Procedure: 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;  Surgeon: Lanis Pupa, MD;  Location: [facility] OR;  Service: Radiology;  Laterality: N/A;   REVERSE SHOULDER ARTHROPLASTY Right 08/12/2014   Procedure: RIGHT REVERSE TOTAL SHOULDER ARTHROPLASTY;  Surgeon: Marcey Her, MD;  Location: [facility] OR;  Service: Orthopedics;  Laterality: Right;   TENDON REPAIR Right 11/2005   right shoulder   TOTAL KNEE ARTHROPLASTY Left    left   TOTAL KNEE ARTHROPLASTY Right 11/17/2015   Procedure: RIGHT TOTAL KNEE ARTHROPLASTY;  Surgeon: Lamar Collet, MD;  Location: [facility];  Service: Orthopedics;  Laterality: Right;   UPPER GASTROINTESTINAL ENDOSCOPY     Family History  Problem Relation Age of Onset   Alzheimer's disease Mother    Lung cancer Father        tobacco   Kidney cancer Son    Stroke Son    Arthritis Son    Hypertension Brother        mom's side   Osteoporosis Sister    Hypertension Sister    Breast cancer Cousin    Heart disease Maternal Uncle        x 4   Heart disease  Maternal Aunt        x 4   Diabetes Paternal Grandmother    Diabetes Paternal Uncle        x 2   Arthritis Daughter    Colon cancer Neg Hx    Social History:  reports that she has never smoked. She has never used smokeless tobacco. She reports that she does not currently use alcohol. She reports that she does not use drugs. Allergies:  Allergies  Allergen Reactions   Gadolinium Derivatives Hives    PER DR DERRY PT NEEDS 13HOUR PREP BEFORE FUTURE CONTRASTED MRI'S//KMS   Aspirin  Other (See Comments)    Acute renal failure  Lactose Intolerance (Gi) Other (See Comments)    Gi upset   Meloxicam     REACTION: acute renal failure  with hypovolemic trigger Hospital  3 2011 Pt states allergic to all NSAIDS    Metoprolol      No issues with Metoprolol  extended release   Sulfamethoxazole Hives   Medications Prior to Admission  Medication Sig Dispense Refill   amLODipine  (NORVASC ) 5 MG tablet TAKE 1 TABLET (5 MG TOTAL) BY MOUTH DAILY. 90 tablet 0   DULoxetine  (CYMBALTA ) 60 MG capsule TAKE 1 CAPSULE BY MOUTH TWICE A DAY (Patient taking differently: Take 60 mg by mouth daily.) 180 capsule 1   memantine  (NAMENDA ) 10 MG tablet Take half  tablet (5 mg at night) for 2 weeks, then increase to 1 tablet (5 mg) twice a day  Then increase to 1 tablet (10 mg)  twice a day 180 tablet 3   Probiotic Product (ALIGN PO) Take 1 tablet by mouth daily.      [DISCONTINUED] metoprolol  succinate (TOPROL -XL) 25 MG 24 hr tablet TAKE 1 TABLET (25 MG TOTAL) BY MOUTH DAILY. STOP THE LOPRESSOR  (TO AVOID TWICE A DAY DOSING.) 90 tablet 1   donepezil  (ARICEPT ) 10 MG tablet Take 10 mg by mouth at bedtime. (Patient not taking: Reported on 12/02/2023)     melatonin 5 MG TABS Take 5 mg by mouth at bedtime. (Patient not taking: Reported on 12/02/2023)     omeprazole  (PRILOSEC) 40 MG capsule TAKE 1 CAPSULE BY MOUTH TWICE A DAY (Patient not taking: Reported on 12/02/2023) 180 capsule 3   rosuvastatin  (CRESTOR ) 40 MG tablet TAKE 1  TABLET BY MOUTH EVERY DAY (Patient not taking: Reported on 12/02/2023) 90 tablet 0   Suvorexant  (BELSOMRA ) 10 MG TABS Take 10 mg by mouth at bedtime. For sleep (Patient not taking: Reported on 12/02/2023) 30 tablet 0      Home: Home Living Family/patient expects to be discharged to:: Inpatient rehab Living Arrangements: Alone Available Help at Discharge: Family, Available PRN/intermittently Type of Home: House Home Access: Stairs to enter Secretary/administrator of Steps: 1 Home Layout: One level Bathroom Shower/Tub: Walk-in shower Home Equipment: Agricultural consultant (2 wheels), [employer] - single point Additional Comments: daughter recently got her RW due to imbalance   Functional History: Prior Function Prior Level of Function : Needs assist Mobility Comments: assisted for meal prep and housekeeping, pt does not drive ADLs Comments: independent in basic self care  Functional Status:  Mobility: Bed Mobility Overal bed mobility: Needs Assistance Bed Mobility: Supine to Sit Supine to sit: Min assist General bed mobility comments: cues for initiation and sequencing. minA for trunk elevation and scooting forward to EOB Transfers Overall transfer level: Needs assistance Equipment used: Rolling walker (2 wheels) Transfers: Sit to/from Stand Sit to Stand: Min assist, +2 physical assistance, +2 safety/equipment Bed to/from chair/wheelchair/BSC transfer type:: Step pivot Step pivot transfers: Mod assist, +2 physical assistance General transfer comment: Min A +2 progressing to min A +1 to stand from EOB and recliner. cues for hand placement Ambulation/Gait Ambulation/Gait assistance: Min assist, +2 safety/equipment Gait Distance (Feet): 75 Feet (x2) Assistive device: Rolling walker (2 wheels) Gait Pattern/deviations: Decreased stride length, Shuffle, Trunk flexed, Step-through pattern, Drifts right/left General Gait Details: Pt demonstrates short steps with low foot clearance, but is able to  briefly increase step length with frequent cues. Cues for upright posture and assist for RW management due to L drift    ADL: ADL Overall ADL's : Needs assistance/impaired Eating/Feeding: Set up, Sitting  Grooming: Brushing hair, Moderate assistance, Sitting Grooming Details (indicate cue type and reason): decreased thoroughness Upper Body Bathing: Maximal assistance, Sitting Lower Body Bathing: Total assistance, +2 for physical assistance, Sit to/from stand Upper Body Dressing : Maximal assistance, Sitting Lower Body Dressing: Total assistance, Bed level Toilet Transfer: +2 for physical assistance, Stand-pivot, Minimal assistance, Rolling walker (2 wheels), BSC/3in1 Toileting- Clothing Manipulation and Hygiene: Maximal assistance, +2 for physical assistance, Sit to/from stand Toileting - Clothing Manipulation Details (indicate cue type and reason): pt able to complete pericare with assist for standing balance and clothing management Functional mobility during ADLs: Moderate assistance, +2 for safety/equipment, Rolling walker (2 wheels)  Cognition: Cognition Orientation Level: Oriented to person, Oriented to place, Disoriented to time, Disoriented to situation Cognition Arousal: Alert Behavior During Therapy: [facility] for tasks assessed/performed  Physical Exam: Blood pressure 125/69, pulse 62, temperature 97.9 F (36.6 C), temperature source Oral, resp. rate 17, height 5' 3 (1.6 m), weight 53.5 kg, SpO2 99%. Physical Exam Constitutional:      General: She is not in acute distress.    Appearance: She is not ill-appearing.  HENT:     Head: Normocephalic and atraumatic.     Right Ear: External ear normal.     Left Ear: External ear normal.     Nose: Nose normal.     Mouth/Throat:     Mouth: Mucous membranes are moist.  Eyes:     Extraocular Movements: Extraocular movements intact.     Pupils: Pupils are equal, round, and reactive to light.  Cardiovascular:     Rate and Rhythm:  Normal rate and regular rhythm.     Heart sounds: No murmur heard.    No gallop.  Pulmonary:     Effort: Pulmonary effort is normal. No respiratory distress.     Breath sounds: No wheezing.  Abdominal:     General: Bowel sounds are normal. There is no distension.     Palpations: Abdomen is soft.     Tenderness: There is no abdominal tenderness.  Musculoskeletal:        General: No swelling or tenderness. Normal range of motion.     Cervical back: Normal range of motion.  Skin:    General: Skin is warm and dry.     Comments: Bilateral TKA scars  Neurological:     Mental Status: She is alert.     Comments: Pt is alert, oriented to person, hospital, date with extra time. Word finding deficits at times. With extra time usually able to convey thoughts. STM deficits. Fair awareness and insight.  CN exam non-focal. Speech generally clear. MMT: RUE 4+ to 5/5 prox to distal. LLE 4 to 4+/5 prox to distal. RLE 4/5 HF 4+ KE,  5/5 ADF/PF. LLE 3- to 3/5 HF, 3/5 KE and 3-4/5 ADF/PF. Sensory exam normal for light touch and pain in all 4 limbs. No limb ataxia or cerebellar signs. No abnormal tone appreciated.  DTR's 1+.   Psychiatric:        Mood and Affect: Mood normal.        Behavior: Behavior normal.     Results for orders placed or performed during the hospital encounter of 12/01/23 (from the past 48 hours)  Glucose, capillary     Status: Abnormal   Collection Time: 12/03/23 11:20 AM  Result Value Ref Range   Glucose-Capillary 106 (H) 70 - 99 mg/dL    Comment: Glucose reference range applies only to samples taken after fasting for at least 8 hours.  Glucose, capillary     Status: Abnormal   Collection Time: 12/03/23  3:27 PM  Result Value Ref Range   Glucose-Capillary 157 (H) 70 - 99 mg/dL    Comment: Glucose reference range applies only to samples taken after fasting for at least 8 hours.   VAS US  LOWER EXTREMITY VENOUS (DVT) Result Date: 12/03/2023  Lower Venous DVT Study Patient Name:   [name] [name]  Date of Exam:   12/03/2023 Medical Rec #: [id number]     Accession #:    [id number] Date of Birth: Feb 04, 1941     Patient Gender: F Patient Age:   45 years Exam Location:  [facility] Procedure:      VAS US  LOWER EXTREMITY VENOUS (DVT) Referring Phys: PRAMOD SETHI --------------------------------------------------------------------------------  Indications: Stroke.  Comparison Study: No previous exams available for review. Performing Technologist: Ezzie Potters RVT, RDMS  Examination Guidelines: A complete evaluation includes B-mode imaging, spectral Doppler, color Doppler, and power Doppler as needed of all accessible portions of each vessel. Bilateral testing is considered an integral part of a complete examination. Limited examinations for reoccurring indications may be performed as noted. The reflux portion of the exam is performed with the patient in reverse Trendelenburg.  +---------+---------------+---------+-----------+----------+--------------+ RIGHT    CompressibilityPhasicitySpontaneityPropertiesThrombus Aging +---------+---------------+---------+-----------+----------+--------------+ CFV      Full           Yes      Yes                                 +---------+---------------+---------+-----------+----------+--------------+ SFJ      Full                                                        +---------+---------------+---------+-----------+----------+--------------+ FV Prox  Full           Yes      Yes                                 +---------+---------------+---------+-----------+----------+--------------+ FV Mid   Full           Yes      Yes                                 +---------+---------------+---------+-----------+----------+--------------+ FV DistalFull           Yes      Yes                                 +---------+---------------+---------+-----------+----------+--------------+ PFV      Full                                                         +---------+---------------+---------+-----------+----------+--------------+ POP      Full           Yes      Yes                                 +---------+---------------+---------+-----------+----------+--------------+  PTV      Full                                                        +---------+---------------+---------+-----------+----------+--------------+   +---------+---------------+---------+-----------+----------+--------------+ LEFT     CompressibilityPhasicitySpontaneityPropertiesThrombus Aging +---------+---------------+---------+-----------+----------+--------------+ CFV      Full           Yes      Yes                                 +---------+---------------+---------+-----------+----------+--------------+ SFJ      Full                                                        +---------+---------------+---------+-----------+----------+--------------+ FV Prox  Full           Yes      Yes                                 +---------+---------------+---------+-----------+----------+--------------+ FV Mid   Full           Yes      Yes                                 +---------+---------------+---------+-----------+----------+--------------+ FV DistalFull           Yes      Yes                                 +---------+---------------+---------+-----------+----------+--------------+ PFV      Full                                                        +---------+---------------+---------+-----------+----------+--------------+ POP      Full           Yes      Yes                                 +---------+---------------+---------+-----------+----------+--------------+ PTV      Full                                                        +---------+---------------+---------+-----------+----------+--------------+ PERO     Full                                                         +---------+---------------+---------+-----------+----------+--------------+  Summary: BILATERAL: - No evidence of deep vein thrombosis seen in the lower extremities, bilaterally. -No evidence of popliteal cyst, bilaterally.   *See table(s) above for measurements and observations. Electronically signed by Fonda Rim on 12/03/2023 at 6:47:44 PM.    Final       Blood pressure 125/69, pulse 62, temperature 97.9 F (36.6 C), temperature source Oral, resp. rate 17, height 5' 3 (1.6 m), weight 53.5 kg, SpO2 99%.  Medical Problem List and Plan: 1. 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  Antithrombotics: -DVT/anticoagulation:  Pharmaceutical: Heparin   -antiplatelet therapy: N/A 3. Pain Management: Tylenol  as needed 4. Mood/Behavior/Sleep: Cymbalta  60 mg daily, Namenda  10 mg twice daily  -antipsychotic agents: N/A 5. Neuropsych/cognition: This patient is not capable of making decisions on her own behalf. 6. Skin/Wound Care: Routine skin checks 7. Fluids/Electrolytes/Nutrition: Routine in and outs with follow-up chemistries on Monday 8.  Hypertension.  Norvasc  10 mg daily, Toprol -XL 12.5 mg daily.  Monitor with increased mobility  -reasonable control at present 9.  Hyperlipidemia.  Crestor      Toribio JINNY Pitch, PA-C 12/05/2023

## 2023-12-05 NOTE — H&P (Signed)
 Physical Medicine and Rehabilitation Admission H&P        Chief Complaint  Patient presents with   Aphasia  : HPI: [name] [name] is an 83 year old right right-handed female with history significant for dementia maintained on Namenda  , hyperlipidemia, GERD, hypertension, CAD, fibromyalgia, interstitial cystitis, basilar tip aneurysm status post embolization 2021, bilateral total knee replacement.  Per chart review patient lives alone.  1 level home one-step to entry.  She does use a rolling walker for balance.  Daughter with good support and provides meals.  Independent in basic self-care.  Patient does not drive.  Presented 12/01/2023 with progressive decrease in ambulation as well as intermittent bouts of confusion x 1 week.  Blood pressure 159/113.  Daughter reports that over the past week patient has been less talkative and stumbling with ambulation and was dragging her left leg.  She denied any recent fall.  Cranial CT scan showed acute intraparenchymal hematoma involving the superior frontal gyrus of the right frontal lobe measuring 3.1 x 3.6 x 1.5 cm in greatest dimension.  Mild mass effect with effacement of the overlying sulcal.  No midline shift.  Embolization coils in the region of the basilar tip.  MRI revealed 2 acute/subacute intra-axial hemorrhages in the right hemisphere.  Underlying microhemorrhages in the left parietal occipital lobes appeared somewhat increased from MRI of last year.  Mild associated cerebral edema no significant intracranial mass effect or complicating features.  MRA with no large vessel occlusion.  Admission chemistries unremarkable, urinalysis negative nitrite.  EEG negative for seizure.  Echo card gram ejection fraction of 65 to 70% grade 1 diastolic dysfunction.  No regional wall motion abnormalities.  Initially placed on Cleviprex  for blood pressure control.  Patient was cleared to begin subcutaneous heparin  for DVT prophylaxis 12/03/2023.  Tolerating a regular diet.   Therapy evaluations completed due to patient's decreased functional mobility was admitted for a comprehensive rehab program.   Review of Systems  Constitutional:  Negative for chills and fever.  HENT:  Negative for hearing loss.   Eyes:  Negative for blurred vision and double vision.  Respiratory:  Negative for cough, shortness of breath and wheezing.   Cardiovascular:  Positive for leg swelling. Negative for chest pain and palpitations.  Gastrointestinal:  Positive for constipation. Negative for heartburn and nausea.       GERD  Genitourinary:  Positive for urgency. Negative for dysuria, flank pain and hematuria.  Musculoskeletal:  Positive for back pain and myalgias.  Neurological:  Positive for focal weakness.       Vertigo  Psychiatric/Behavioral:  Positive for depression.        Anxiety  All other systems reviewed and are negative.      Past Medical History:  Diagnosis Date   Acute kidney failure (HCC)      5 years ago   ACUTE KIDNEY FAILURE UNSPECIFIED 07/12/2009   Allergy     Anemia     Anxiety     Arthritis     Basal cell carcinoma     Blood clot in vein      states it was not deep vein   CAD (coronary artery disease)      Mild nonobstructive by CT   Cataract     Cerebral aneurysm without rupture 2021   Chronic kidney disease      stones   Depression      around the time of her husband's death   Diverticulosis of colon (without mention of hemorrhage)  Fibromyalgia     GERD (gastroesophageal reflux disease)     Headache     Hiatal hernia     History of kidney stones     Hyperlipidemia     Hypertension     Interstitial cystitis     Kidney stone     Osteopenia     PONV (postoperative nausea and vomiting)     Scoliosis     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    Qualifier: Diagnosis of  By: Jakie MD NOLIA Alm SAUNDERS    Vertigo               Past Surgical History:  Procedure Laterality Date   BLADDER TUMOR EXCISION   2001   CATARACT EXTRACTION         bilateral   CHOLECYSTECTOMY       COLOSTOMY       CYSTECTOMY   2001    benign   ESOPHAGEAL MANOMETRY N/A 07/13/2012    Procedure: ESOPHAGEAL MANOMETRY (EM);  Surgeon: Alm SAUNDERS Jakie, MD;  Location: [facility];  Service: Endoscopy;  Laterality: N/A;   HIATAL HERNIA REPAIR       IR ANGIO INTRA EXTRACRAN SEL INTERNAL CAROTID BILAT MOD SED   11/02/2019   IR ANGIO INTRA EXTRACRAN SEL INTERNAL CAROTID BILAT MOD SED   10/31/2020   IR ANGIO VERTEBRAL SEL VERTEBRAL BILAT MOD SED   12/17/2019   IR ANGIO VERTEBRAL SEL VERTEBRAL UNI L MOD SED   10/31/2020   IR ANGIO VERTEBRAL SEL VERTEBRAL UNI R MOD SED   11/02/2019   IR ANGIOGRAM FOLLOW UP STUDY   12/17/2019   IR ANGIOGRAM FOLLOW UP STUDY   12/17/2019   IR ANGIOGRAM FOLLOW UP STUDY   12/17/2019   IR ANGIOGRAM FOLLOW UP STUDY   12/17/2019   IR NEURO EACH ADD'L AFTER BASIC UNI LEFT (MS)   12/17/2019   IR TRANSCATH/EMBOLIZ   12/17/2019   LUMBAR DISC SURGERY        x 2 elsner   RADIOLOGY WITH ANESTHESIA N/A 12/17/2019    Procedure: 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;  Surgeon: Lanis Pupa, MD;  Location: [facility] OR;  Service: Radiology;  Laterality: N/A;   REVERSE SHOULDER ARTHROPLASTY Right 08/12/2014    Procedure: RIGHT REVERSE TOTAL SHOULDER ARTHROPLASTY;  Surgeon: Marcey Her, MD;  Location: [facility] OR;  Service: Orthopedics;  Laterality: Right;   TENDON REPAIR Right 11/2005    right shoulder   TOTAL KNEE ARTHROPLASTY Left      left   TOTAL KNEE ARTHROPLASTY Right 11/17/2015    Procedure: RIGHT TOTAL KNEE ARTHROPLASTY;  Surgeon: Lamar Collet, MD;  Location: [facility];  Service: Orthopedics;  Laterality: Right;   UPPER GASTROINTESTINAL ENDOSCOPY                 Family History  Problem Relation Age of Onset   Alzheimer's disease Mother     Lung cancer Father          tobacco   Kidney cancer Son     Stroke Son     Arthritis Son     Hypertension Brother          mom's side   Osteoporosis Sister      Hypertension Sister     Breast cancer Cousin     Heart disease Maternal Uncle          x 4   Heart disease Maternal Aunt          x 4  Diabetes Paternal Grandmother     Diabetes Paternal Uncle          x 2   Arthritis Daughter     Colon cancer Neg Hx          Social History:  reports that she has never smoked. She has never used smokeless tobacco. She reports that she does not currently use alcohol. She reports that she does not use drugs. Allergies:  Allergies       Allergies  Allergen Reactions   Gadolinium Derivatives Hives      PER DR DERRY PT NEEDS 13HOUR PREP BEFORE FUTURE CONTRASTED MRI'S//KMS   Aspirin  Other (See Comments)      Acute renal failure    Lactose Intolerance (Gi) Other (See Comments)      Gi upset   Meloxicam        REACTION: acute renal failure  with hypovolemic trigger Hospital  3 2011 Pt states allergic to all NSAIDS    Metoprolol         No issues with Metoprolol  extended release   Sulfamethoxazole Hives            Medications Prior to Admission  Medication Sig Dispense Refill   amLODipine  (NORVASC ) 5 MG tablet TAKE 1 TABLET (5 MG TOTAL) BY MOUTH DAILY. 90 tablet 0   DULoxetine  (CYMBALTA ) 60 MG capsule TAKE 1 CAPSULE BY MOUTH TWICE A DAY (Patient taking differently: Take 60 mg by mouth daily.) 180 capsule 1   memantine  (NAMENDA ) 10 MG tablet Take half  tablet (5 mg at night) for 2 weeks, then increase to 1 tablet (5 mg) twice a day  Then increase to 1 tablet (10 mg)  twice a day 180 tablet 3   Probiotic Product (ALIGN PO) Take 1 tablet by mouth daily.        [DISCONTINUED] metoprolol  succinate (TOPROL -XL) 25 MG 24 hr tablet TAKE 1 TABLET (25 MG TOTAL) BY MOUTH DAILY. STOP THE LOPRESSOR  (TO AVOID TWICE A DAY DOSING.) 90 tablet 1   donepezil  (ARICEPT ) 10 MG tablet Take 10 mg by mouth at bedtime. (Patient not taking: Reported on 12/02/2023)       melatonin 5 MG TABS Take 5 mg by mouth at bedtime. (Patient not taking: Reported on 12/02/2023)        omeprazole  (PRILOSEC) 40 MG capsule TAKE 1 CAPSULE BY MOUTH TWICE A DAY (Patient not taking: Reported on 12/02/2023) 180 capsule 3   rosuvastatin  (CRESTOR ) 40 MG tablet TAKE 1 TABLET BY MOUTH EVERY DAY (Patient not taking: Reported on 12/02/2023) 90 tablet 0   Suvorexant  (BELSOMRA ) 10 MG TABS Take 10 mg by mouth at bedtime. For sleep (Patient not taking: Reported on 12/02/2023) 30 tablet 0              Home: Home Living Family/patient expects to be discharged to:: Inpatient rehab Living Arrangements: Alone Available Help at Discharge: Family, Available PRN/intermittently Type of Home: House Home Access: Stairs to enter [employer] of Steps: 1 Home Layout: One level Bathroom Shower/Tub: Walk-in shower Home Equipment: Agricultural consultant (2 wheels), [employer] - single point Additional Comments: daughter recently got her RW due to imbalance   Functional History: Prior Function Prior Level of Function : Needs assist Mobility Comments: assisted for meal prep and housekeeping, pt does not drive ADLs Comments: independent in basic self care   Functional Status:  Mobility: Bed Mobility Overal bed mobility: Needs Assistance Bed Mobility: Supine to Sit Supine to sit: Min assist General bed mobility comments:  cues for initiation and sequencing. minA for trunk elevation and scooting forward to EOB Transfers Overall transfer level: Needs assistance Equipment used: Rolling walker (2 wheels) Transfers: Sit to/from Stand Sit to Stand: Min assist, +2 physical assistance, +2 safety/equipment Bed to/from chair/wheelchair/BSC transfer type:: Step pivot Step pivot transfers: Mod assist, +2 physical assistance General transfer comment: Min A +2 progressing to min A +1 to stand from EOB and recliner. cues for hand placement Ambulation/Gait Ambulation/Gait assistance: Min assist, +2 safety/equipment Gait Distance (Feet): 75 Feet (x2) Assistive device: Rolling walker (2 wheels) Gait  Pattern/deviations: Decreased stride length, Shuffle, Trunk flexed, Step-through pattern, Drifts right/left General Gait Details: Pt demonstrates short steps with low foot clearance, but is able to briefly increase step length with frequent cues. Cues for upright posture and assist for RW management due to L drift   ADL: ADL Overall ADL's : Needs assistance/impaired Eating/Feeding: Set up, Sitting Grooming: Brushing hair, Moderate assistance, Sitting Grooming Details (indicate cue type and reason): decreased thoroughness Upper Body Bathing: Maximal assistance, Sitting Lower Body Bathing: Total assistance, +2 for physical assistance, Sit to/from stand Upper Body Dressing : Maximal assistance, Sitting Lower Body Dressing: Total assistance, Bed level Toilet Transfer: +2 for physical assistance, Stand-pivot, Minimal assistance, Rolling walker (2 wheels), BSC/3in1 Toileting- Clothing Manipulation and Hygiene: Maximal assistance, +2 for physical assistance, Sit to/from stand Toileting - Clothing Manipulation Details (indicate cue type and reason): pt able to complete pericare with assist for standing balance and clothing management Functional mobility during ADLs: Moderate assistance, +2 for safety/equipment, Rolling walker (2 wheels)   Cognition: Cognition Orientation Level: Oriented to person, Oriented to place, Disoriented to time, Disoriented to situation Cognition Arousal: Alert Behavior During Therapy: [facility] for tasks assessed/performed   Physical Exam: Blood pressure 125/69, pulse 62, temperature 97.9 F (36.6 C), temperature source Oral, resp. rate 17, height 5' 3 (1.6 m), weight 53.5 kg, SpO2 99%. Physical Exam Constitutional:      General: She is not in acute distress.    Appearance: She is not ill-appearing.  HENT:     Head: Normocephalic and atraumatic.     Right Ear: External ear normal.     Left Ear: External ear normal.     Nose: Nose normal.     Mouth/Throat:     Mouth:  Mucous membranes are moist.  Eyes:     Extraocular Movements: Extraocular movements intact.     Pupils: Pupils are equal, round, and reactive to light.  Cardiovascular:     Rate and Rhythm: Normal rate and regular rhythm.     Heart sounds: No murmur heard.    No gallop.  Pulmonary:     Effort: Pulmonary effort is normal. No respiratory distress.     Breath sounds: No wheezing.  Abdominal:     General: Bowel sounds are normal. There is no distension.     Palpations: Abdomen is soft.     Tenderness: There is no abdominal tenderness.  Musculoskeletal:        General: No swelling or tenderness. Normal range of motion.     Cervical back: Normal range of motion.  Skin:    General: Skin is warm and dry.     Comments: Bilateral TKA scars  Neurological:     Mental Status: She is alert.     Comments: Pt is alert, oriented to person, hospital, date with extra time. Word finding deficits at times. With extra time usually able to convey thoughts. STM deficits. Fair awareness and insight.  CN  exam non-focal. Speech generally clear. MMT: RUE 4+ to 5/5 prox to distal. LLE 4 to 4+/5 prox to distal. RLE 4/5 HF 4+ KE,  5/5 ADF/PF. LLE 3- to 3/5 HF, 3/5 KE and 3-4/5 ADF/PF. Sensory exam normal for light touch and pain in all 4 limbs. No limb ataxia or cerebellar signs. No abnormal tone appreciated.  DTR's 1+.   Psychiatric:        Mood and Affect: Mood normal.        Behavior: Behavior normal.       Lab Results Last 48 Hours        Results for orders placed or performed during the hospital encounter of 12/01/23 (from the past 48 hours)  Glucose, capillary     Status: Abnormal    Collection Time: 12/03/23 11:20 AM  Result Value Ref Range    Glucose-Capillary 106 (H) 70 - 99 mg/dL      Comment: Glucose reference range applies only to samples taken after fasting for at least 8 hours.  Glucose, capillary     Status: Abnormal    Collection Time: 12/03/23  3:27 PM  Result Value Ref Range     Glucose-Capillary 157 (H) 70 - 99 mg/dL      Comment: Glucose reference range applies only to samples taken after fasting for at least 8 hours.       Imaging Results (Last 48 hours)  VAS US  LOWER EXTREMITY VENOUS (DVT) Result Date: 12/03/2023  Lower Venous DVT Study Patient Name:  [name] [name]  Date of Exam:   12/03/2023 Medical Rec #: [id number]     Accession #:    [id number] Date of Birth: [birth year]     Patient Gender: F Patient Age:   34 years Exam Location:  [facility] Procedure:      VAS US  LOWER EXTREMITY VENOUS (DVT) Referring Phys: PRAMOD SETHI --------------------------------------------------------------------------------  Indications: Stroke.  Comparison Study: No previous exams available for review. Performing Technologist: Ezzie Potters RVT, RDMS  Examination Guidelines: A complete evaluation includes B-mode imaging, spectral Doppler, color Doppler, and power Doppler as needed of all accessible portions of each vessel. Bilateral testing is considered an integral part of a complete examination. Limited examinations for reoccurring indications may be performed as noted. The reflux portion of the exam is performed with the patient in reverse Trendelenburg.  +---------+---------------+---------+-----------+----------+--------------+ RIGHT    CompressibilityPhasicitySpontaneityPropertiesThrombus Aging +---------+---------------+---------+-----------+----------+--------------+ CFV      Full           Yes      Yes                                 +---------+---------------+---------+-----------+----------+--------------+ SFJ      Full                                                        +---------+---------------+---------+-----------+----------+--------------+ FV Prox  Full           Yes      Yes                                 +---------+---------------+---------+-----------+----------+--------------+ FV Mid   Full  Yes      Yes                                  +---------+---------------+---------+-----------+----------+--------------+ FV DistalFull           Yes      Yes                                 +---------+---------------+---------+-----------+----------+--------------+ PFV      Full                                                        +---------+---------------+---------+-----------+----------+--------------+ POP      Full           Yes      Yes                                 +---------+---------------+---------+-----------+----------+--------------+ PTV      Full                                                        +---------+---------------+---------+-----------+----------+--------------+   +---------+---------------+---------+-----------+----------+--------------+ LEFT     CompressibilityPhasicitySpontaneityPropertiesThrombus Aging +---------+---------------+---------+-----------+----------+--------------+ CFV      Full           Yes      Yes                                 +---------+---------------+---------+-----------+----------+--------------+ SFJ      Full                                                        +---------+---------------+---------+-----------+----------+--------------+ FV Prox  Full           Yes      Yes                                 +---------+---------------+---------+-----------+----------+--------------+ FV Mid   Full           Yes      Yes                                 +---------+---------------+---------+-----------+----------+--------------+ FV DistalFull           Yes      Yes                                 +---------+---------------+---------+-----------+----------+--------------+ PFV      Full                                                        +---------+---------------+---------+-----------+----------+--------------+  POP      Full           Yes      Yes                                  +---------+---------------+---------+-----------+----------+--------------+ PTV      Full                                                        +---------+---------------+---------+-----------+----------+--------------+ PERO     Full                                                        +---------+---------------+---------+-----------+----------+--------------+     Summary: BILATERAL: - No evidence of deep vein thrombosis seen in the lower extremities, bilaterally. -No evidence of popliteal cyst, bilaterally.   *See table(s) above for measurements and observations. Electronically signed by Fonda Rim on 12/03/2023 at 6:47:44 PM.    Final            Blood pressure 125/69, pulse 62, temperature 97.9 F (36.6 C), temperature source Oral, resp. rate 17, height 5' 3 (1.6 m), weight 53.5 kg, SpO2 99%.   Medical Problem List and Plan: 1. 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  Antithrombotics: -DVT/anticoagulation:  Pharmaceutical: Heparin              -antiplatelet therapy: N/A 3. Pain Management: Tylenol  as needed 4. Mood/Behavior/Sleep: Cymbalta  60 mg daily, Namenda  10 mg twice daily             -antipsychotic agents: N/A 5. Neuropsych/cognition: This patient is not capable of making decisions on her own behalf. 6. Skin/Wound Care: Routine skin checks 7. Fluids/Electrolytes/Nutrition: Routine in and outs with follow-up chemistries on Monday 8.  Hypertension.  Norvasc  10 mg daily, Toprol -XL 12.5 mg daily.  Monitor with increased mobility             -reasonable control at present 9.  Hyperlipidemia.  Crestor          Toribio PARAS Angiulli, PA-C 12/05/2023  I have  personally performed a face to face diagnostic evaluation of this patient and formulated the key components of the plan.  Additionally, I have personally reviewed laboratory data, imaging studies, as well as relevant notes and concur with the physician assistant's documentation above.  The patient's status has not changed from the original H&P.  Any changes in documentation from the acute care chart have been noted above.  Arthea IVAR Gunther, MD, [facility]LL[facility]

## 2023-12-06 DIAGNOSIS — I619 Nontraumatic intracerebral hemorrhage, unspecified: Secondary | ICD-10-CM

## 2023-12-06 MED ORDER — MAGNESIUM GLUCONATE 500 (27 MG) MG PO TABS
250.0000 mg | ORAL_TABLET | Freq: Every day | ORAL | Status: DC
  Administered 2023-12-06 – 2023-12-07 (×2): 250 mg via ORAL
  Filled 2023-12-06 (×2): qty 1

## 2023-12-06 NOTE — Progress Notes (Signed)
 PROGRESS NOTE   Subjective/Complaints: BP normalized but was elevated this morning after ted hose were placed, have d/ced order She has no complaints this morning  ROS: +impaired cognition   Objective:   No results found. Recent Labs    12/05/23 1734  WBC 4.6  HGB 12.5  HCT 38.5  PLT 164   Recent Labs    12/05/23 1734  CREATININE 1.03*    Intake/Output Summary (Last 24 hours) at 12/06/2023 1422 Last data filed at 12/06/2023 1052 Gross per 24 hour  Intake 350 ml  Output --  Net 350 ml        Physical Exam: Vital Signs Blood pressure 126/80, pulse 82, temperature 97.7 F (36.5 C), temperature source Oral, resp. rate 16, height 5' 3 (1.6 m), weight 56.9 kg, SpO2 100%. Gen: no distress, normal appearing HEENT: oral mucosa pink and moist, NCAT Cardio: Reg rate Chest: normal effort, normal rate of breathing Abd: soft, non-distended Ext: no edema Psych: pleasant, normal affect Skin: intact Musculoskeletal:        General: No swelling or tenderness. Normal range of motion.     Cervical back: Normal range of motion.  Skin:    General: Skin is warm and dry.     Comments: Bilateral TKA scars  Neurological:     Mental Status: She is alert.     Comments: Pt is alert, oriented to person, hospital, date with extra time. Word finding deficits at times. With extra time usually able to convey thoughts. STM deficits. Fair awareness and insight.  CN exam non-focal. Speech generally clear. MMT: RUE 4+ to 5/5 prox to distal. LLE 4 to 4+/5 prox to distal. RLE 4/5 HF 4+ KE,  5/5 ADF/PF. LLE 3- to 3/5 HF, 3/5 KE and 3-4/5 ADF/PF. Sensory exam normal for light touch and pain in all 4 limbs. No limb ataxia or cerebellar signs. No abnormal tone appreciated.  DTR's 1+.   Assessment/Plan: 1. Functional deficits which require 3+ hours per day of interdisciplinary therapy in a comprehensive inpatient rehab setting. Physiatrist is  providing close team supervision and 24 hour management of active medical problems listed below. Physiatrist and rehab team continue to assess barriers to discharge/monitor patient progress toward functional and medical goals  Care Tool:  Bathing              Bathing assist       Upper Body Dressing/Undressing Upper body dressing        Upper body assist      Lower Body Dressing/Undressing Lower body dressing            Lower body assist       Toileting Toileting    Toileting assist       Transfers Chair/bed transfer  Transfers assist     Chair/bed transfer assist level: Moderate Assistance - Patient 50 - 74%     Locomotion Ambulation   Ambulation assist   Ambulation activity did not occur: Safety/medical concerns          Walk 10 feet activity   Assist  Walk 10 feet activity did not occur: Safety/medical concerns        Walk 50  feet activity   Assist Walk 50 feet with 2 turns activity did not occur: Safety/medical concerns         Walk 150 feet activity   Assist Walk 150 feet activity did not occur: Safety/medical concerns         Walk 10 feet on uneven surface  activity   Assist Walk 10 feet on uneven surfaces activity did not occur: Safety/medical concerns         Wheelchair     Assist Is the patient using a wheelchair?: No Type of Wheelchair: Manual    Wheelchair assist level: Dependent - Patient 0% Max wheelchair distance: 150    Wheelchair 50 feet with 2 turns activity    Assist        Assist Level: Dependent - Patient 0%   Wheelchair 150 feet activity     Assist      Assist Level: Dependent - Patient 0%   Blood pressure 126/80, pulse 82, temperature 97.7 F (36.5 C), temperature source Oral, resp. rate 16, height 5' 3 (1.6 m), weight 56.9 kg, SpO2 100%.  Medical Problem List and Plan: 1. Functional deficits secondary to intraparenchymal hemorrhage/right frontal lobe/superior  frontal gyrus as well as history of basilar tip aneurysm status post embolization 2021.  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  Antithrombotics: -DVT/anticoagulation:  Pharmaceutical: continue Heparin              -antiplatelet therapy: N/A  3. Pain Management: Tylenol  as needed 4. Mood/Behavior/Sleep: continue Cymbalta  60 mg daily, Namenda  10 mg twice daily             -antipsychotic agents: N/A 5. Neuropsych/cognition: This patient is not capable of making decisions on her own behalf. 6. Skin/Wound Care: Routine skin checks 7. Fluids/Electrolytes/Nutrition: Routine in and outs with follow-up chemistries on Monday  8.  Hypertension.  Norvasc  10 mg daily, Toprol -XL 12.5 mg daily.  Monitor with increased mobility, magnesium  supplement started            Improved with discontinuation of teds  9.  Hyperlipidemia.  Continue Crestor   10. Likely peripheral arterial disease: would benefit from outpatient vascular follow-up, may benefit from outpatient Qutenza, d/c teds as worsens symptoms of cold feet  LOS: 1 days A FACE TO FACE EVALUATION WAS PERFORMED  Oliver Heitzenrater P Naziya Hegwood 12/06/2023, 2:22 PM

## 2023-12-06 NOTE — Evaluation (Signed)
 Occupational Therapy Assessment and Plan  Patient Details  Name: [name] [name] MRN: [id number] Date of Birth: 18-Nov-1940  OT Diagnosis: abnormal posture, apraxia, cognitive deficits, lumbago (low back pain), and muscle weakness (generalized) Rehab Potential: Rehab Potential (ACUTE ONLY): Good ELOS: 12-14 days   Today's Date: 12/06/2023 OT Individual Time: 8954-8784 OT Individual Time Calculation (min): 90 min     Hospital Problem: Principal Problem:   Intraparenchymal hemorrhage of brain (HCC)   Past Medical History:  Past Medical History:  Diagnosis Date   Acute kidney failure (HCC)    5 years ago   ACUTE KIDNEY FAILURE UNSPECIFIED 07/12/2009   Allergy    Anemia    Anxiety    Arthritis    Basal cell carcinoma    Blood clot in vein    states it was not deep vein   CAD (coronary artery disease)    Mild nonobstructive by CT   Cataract    Cerebral aneurysm without rupture 2021   Chronic kidney disease    stones   Depression    around the time of her husband's death   Diverticulosis of colon (without mention of hemorrhage)    Fibromyalgia    GERD (gastroesophageal reflux disease)    Headache    Hiatal hernia    History of kidney stones    Hyperlipidemia    Hypertension    Interstitial cystitis    Kidney stone    Osteopenia    PONV (postoperative nausea and vomiting)    Scoliosis    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   Qualifier: Diagnosis of  By: Jakie MD NOLIA Alm SAUNDERS    Vertigo    Past Surgical History:  Past Surgical History:  Procedure Laterality Date   BLADDER TUMOR EXCISION  2001   CATARACT EXTRACTION     bilateral   CHOLECYSTECTOMY     COLOSTOMY     CYSTECTOMY  2001   benign   ESOPHAGEAL MANOMETRY N/A 07/13/2012   Procedure: ESOPHAGEAL MANOMETRY (EM);  Surgeon: Alm SAUNDERS Jakie, MD;  Location: [facility];  Service: Endoscopy;  Laterality: N/A;   HIATAL HERNIA REPAIR     IR ANGIO INTRA EXTRACRAN SEL INTERNAL CAROTID BILAT MOD SED  11/02/2019    IR ANGIO INTRA EXTRACRAN SEL INTERNAL CAROTID BILAT MOD SED  10/31/2020   IR ANGIO VERTEBRAL SEL VERTEBRAL BILAT MOD SED  12/17/2019   IR ANGIO VERTEBRAL SEL VERTEBRAL UNI L MOD SED  10/31/2020   IR ANGIO VERTEBRAL SEL VERTEBRAL UNI R MOD SED  11/02/2019   IR ANGIOGRAM FOLLOW UP STUDY  12/17/2019   IR ANGIOGRAM FOLLOW UP STUDY  12/17/2019   IR ANGIOGRAM FOLLOW UP STUDY  12/17/2019   IR ANGIOGRAM FOLLOW UP STUDY  12/17/2019   IR NEURO EACH ADD'L AFTER BASIC UNI LEFT (MS)  12/17/2019   IR TRANSCATH/EMBOLIZ  12/17/2019   LUMBAR DISC SURGERY     x 2 elsner   RADIOLOGY WITH ANESTHESIA N/A 12/17/2019   Procedure: 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;  Surgeon: Lanis Pupa, MD;  Location: [facility] OR;  Service: Radiology;  Laterality: N/A;   REVERSE SHOULDER ARTHROPLASTY Right 08/12/2014   Procedure: RIGHT REVERSE TOTAL SHOULDER ARTHROPLASTY;  Surgeon: Marcey Her, MD;  Location: [facility];  Service: Orthopedics;  Laterality: Right;   TENDON REPAIR Right 11/2005   right shoulder   TOTAL KNEE ARTHROPLASTY Left    left   TOTAL KNEE ARTHROPLASTY Right 11/17/2015   Procedure: RIGHT TOTAL KNEE ARTHROPLASTY;  Surgeon: Lamar Collet, MD;  Location: [facility];  Service: Orthopedics;  Laterality: Right;   UPPER GASTROINTESTINAL ENDOSCOPY      Assessment & Plan Clinical Impression:Quanta B Depaz is an 83 year old right right-handed female with history significant for dementia maintained on Namenda  , hyperlipidemia, GERD, hypertension, CAD, fibromyalgia, interstitial cystitis, basilar tip aneurysm status post embolization 2021, bilateral total knee replacement. Per chart review patient lives alone. 1 level home one-step to entry. She does use a rolling walker for balance. Daughter with good support and provides meals. Independent in basic self-care. Patient does not drive. Presented 12/01/2023 with progressive decrease in ambulation as well as intermittent bouts of confusion x 1 week. Blood  pressure 159/113. Daughter reports that over the past week patient has been less talkative and stumbling with ambulation and was dragging her left leg. She denied any recent fall. Cranial CT scan showed acute intraparenchymal hematoma involving the superior frontal gyrus of the right frontal lobe measuring 3.1 x 3.6 x 1.5 cm in greatest dimension. Mild mass effect with effacement of the overlying sulcal. No midline shift. Embolization coils in the region of the basilar tip. MRI revealed 2 acute/subacute intra-axial hemorrhages in the right hemisphere. Underlying microhemorrhages in the left parietal occipital lobes appeared somewhat increased from MRI of last year. Mild associated cerebral edema no significant intracranial mass effect or complicating features. MRA with no large vessel occlusion. Admission chemistries unremarkable, urinalysis negative nitrite. EEG negative for seizure. Echo card gram ejection fraction of 65 to 70% grade 1 diastolic dysfunction. No regional wall motion abnormalities. Initially placed on Cleviprex  for blood pressure control. Patient was cleared to begin subcutaneous heparin  for DVT prophylaxis 12/03/2023. Tolerating a regular diet. Therapy evaluations completed due to patient's decreased functional mobility was admitted for a comprehensive rehab program.  Patient transferred to CIR on 12/05/2023 .    Patient currently requires mod with basic self-care skills secondary to muscle weakness and muscle joint tightness, abnormal tone, motor apraxia, and decreased coordination, and decreased problem solving, decreased memory, and delayed processing.  Prior to hospitalization, patient could complete BADL with modified independent  to MinA.  Patient will benefit from skilled intervention to increase independence with basic self-care skills prior to discharge home with care partner.  Anticipate patient will require 24 hour supervision and follow up home health.  OT - End of Session Activity  Tolerance: Tolerates 30+ min activity with multiple rests Endurance Deficit: Yes Endurance Deficit Description: Challenges with work over time  requiring frequent rest breaks. OT Assessment Rehab Potential (ACUTE ONLY): Good OT Barriers to Discharge: Decreased caregiver support OT Barriers to Discharge Comments: family provided intermitten support prior to admit, pt presents with significant cognitive deficit OT Patient demonstrates impairments in the following area(s): Cognition;Safety OT Basic ADL's Functional Problem(s): Bathing;Dressing;Toileting OT Transfers Functional Problem(s): Tub/Shower;Toilet OT Additional Impairment(s): Fuctional Use of Upper Extremity OT Plan OT Intensity: Minimum of 1-2 x/day, 45 to 90 minutes OT Frequency: Total of 15 hours over 7 days of combined therapies OT Duration/Estimated Length of Stay: 12-14 days OT Treatment/Interventions: Metallurgist training;Patient/family education;Therapeutic Exercise;UE/LE Coordination activities;Neuromuscular re-education;UE/LE Strength taining/ROM;Therapeutic Activities;DME/adaptive equipment instruction;Cognitive remediation/compensation OT Self Feeding Anticipated Outcome(s): S/U A OT Basic Self-Care Anticipated Outcome(s): MinA OT Toileting Anticipated Outcome(s): MinA OT Bathroom Transfers Anticipated Outcome(s): MinA OT Recommendation Recommendations for Other Services: None Patient destination: Home Follow Up Recommendations: Home health OT Equipment Recommended: 3 in 1 bedside comode Equipment Details: for night use and for showering   OT Evaluation Precautions/Restrictions  Precautions Precautions: Fall Recall of Precautions/Restrictions: Impaired Restrictions Weight  Bearing Restrictions Per Provider Order: No General Chart Reviewed: Yes Vital Signs Therapy Vitals Pulse Rate: 78 BP: 117/61 Patient Position (if appropriate): Lying Oxygen Therapy SpO2: 100 % O2 Device: Room Air Patient Activity  (if Appropriate): In bed Pain Pain Assessment Pain Score: 6  Pain Type: Chronic pain (associated with scoli) Pain Location: Back Pain Orientation: Lower Pain Descriptors / Indicators: Other (Comment) (chronic) Pain Onset: On-going Home Living/Prior Functioning Home Living Available Help at Discharge: Family, Available PRN/intermittently (daughter) Type of Home: House Home Access: Stairs to enter (1) Secretary/administrator of Steps: 1 Entrance Stairs-Rails: None Home Layout: One level Bathroom Shower/Tub: Health visitor: Handicapped height (secondary to 3 n 1 over top with arm rails.) Bathroom Accessibility: Yes (RW) Additional Comments: daughter recently got her RW due to imbalance  Lives With: Alone IADL History Homemaking Responsibilities: No (daughter prepares meals) Current License: No (3-4 years) Mode of Transportation: Family Education: High school education Type of Occupation: [employer] Leisure and Hobbies: reading Prior Function Level of Independence: Needs assistance with ADLs (MinA) Bath: Minimal Dressing: Minimal Driving: No Vocation: Retired Leisure:  (loved reading  a few years ago) Vision Baseline Vision/History: 1 Wears glasses (readers) Ability to See in Adequate Light: 0 Adequate Patient Visual Report: No change from baseline Additional Comments: readers Perception  Perception: Within Functional Limits Praxis   Cognition Cognition Overall Cognitive Status: Impaired/Different from baseline Arousal/Alertness: Awake/alert Memory: Impaired Memory Impairment: Decreased recall of new information;Decreased short term memory Decreased Short Term Memory: Verbal basic;Functional basic Attention: Sustained Sustained Attention: Impaired Sustained Attention Impairment: Verbal basic;Functional basic Awareness: Impaired Awareness Impairment: Intellectual impairment;Anticipatory impairment;Emergent impairment Problem Solving: Impaired Problem  Solving Impairment: Verbal basic;Functional basic Executive Function: Reasoning;Sequencing Reasoning: Impaired Reasoning Impairment: Verbal basic Sequencing: Impaired Sequencing Impairment: Verbal basic;Functional basic Safety/Judgment: Impaired Brief Interview for Mental Status (BIMS) Repetition of Three Words (First Attempt): No answer Temporal Orientation: Year: No answer Temporal Orientation: Month: Missed by 6 days to 1 month Temporal Orientation: Day: Incorrect Recall: Sock: No, could not recall Recall: Blue: No, could not recall Recall: Bed: No, could not recall BIMS Summary Score: 1 Sensation Sensation Light Touch: Appears Intact Coordination Gross Motor Movements are Fluid and Coordinated: No Fine Motor Movements are Fluid and Coordinated: No Coordination and Movement Description: guarded Motor  Motor Motor: Abnormal postural alignment and control (associated with scoli) Motor - Skilled Clinical Observations: L sided weakness  Trunk/Postural Assessment  Cervical Assessment Cervical Assessment: Exceptions to [facility] Thoracic Assessment Thoracic Assessment: Exceptions to [facility] Lumbar Assessment Lumbar Assessment: Exceptions to [facility] Postural Control Postural Control: Deficits on evaluation  Balance Static Sitting Balance Static Sitting - Balance Support: Feet supported;Bilateral upper extremity supported Static Sitting - Level of Assistance: 5: Stand by assistance Dynamic Sitting Balance Sitting balance - Comments: sitting EOB Static Standing Balance Static Standing - Balance Support: During functional activity Static Standing - Level of Assistance: 3: Mod assist Dynamic Standing Balance Dynamic Standing - Balance Support: During functional activity Dynamic Standing - Level of Assistance: 3: Mod assist Extremity/Trunk Assessment RUE Assessment Passive Range of Motion (PROM) Comments: WFL Active Range of Motion (AROM) Comments: WFL General Strength Comments:  3+/5 MMT LUE Assessment Passive Range of Motion (PROM) Comments: WFL Active Range of Motion (AROM) Comments: WFL General Strength Comments: 3/5MMT  Care Tool Care Tool Self Care Eating   Eating Assist Level: Set up assist    Oral Care    Oral Care Assist Level: Set up assist    Bathing   Body parts bathed  by patient: Right arm;Left arm;Abdomen;Right upper leg;Left upper leg;Face;Chest Body parts bathed by helper: Buttocks;Front perineal area;Right lower leg;Left lower leg;Left upper leg;Right upper leg   Assist Level: Moderate Assistance - Patient 50 - 74%    Upper Body Dressing(including orthotics)       Assist Level: Moderate Assistance - Patient 50 - 74%    Lower Body Dressing (excluding footwear)   What is the patient wearing?: Incontinence brief Assist for lower body dressing: Maximal Assistance - Patient 25 - 49%    Putting on/Taking off footwear   What is the patient wearing?: Socks Assist for footwear: Dependent - Patient 0%       Care Tool Toileting Toileting activity   Assist for toileting: Dependent - Patient 0%     Care Tool Bed Mobility Roll left and right activity   Roll left and right assist level: Minimal Assistance - Patient > 75%    Sit to lying activity   Sit to lying assist level: Moderate Assistance - Patient 50 - 74%    Lying to sitting on side of bed activity   Lying to sitting on side of bed assist level: the ability to move from lying on the back to sitting on the side of the bed with no back support.: Moderate Assistance - Patient 50 - 74%     Care Tool Transfers Sit to stand transfer   Sit to stand assist level: Moderate Assistance - Patient 50 - 74%    Chair/bed transfer   Chair/bed transfer assist level: Moderate Assistance - Patient 50 - 74%     Toilet transfer   Assist Level: Moderate Assistance - Patient 50 - 74%     Care Tool Cognition  Expression of Ideas and Wants Expression of Ideas and Wants: 3. Some difficulty -  exhibits some difficulty with expressing needs and ideas (e.g, some words or finishing thoughts) or speech is not clear  Understanding Verbal and Non-Verbal Content Understanding Verbal and Non-Verbal Content: 3. Usually understands - understands most conversations, but misses some part/intent of message. Requires cues at times to understand   Memory/Recall Ability Memory/Recall Ability : That he or she is in a hospital/hospital unit   Refer to Care Plan for Long Term Goals  SHORT TERM GOAL WEEK 1 OT Short Term Goal 1 (Week 1): Patient will complete UB bathing and dressing with  MinA. OT Short Term Goal 2 (Week 1): The pt will complete LB bathing and dressing with MinA  incorporating AE as needed. OT Short Term Goal 3 (Week 1): The pt will improve UB strength to 4/5 MMT OT Short Term Goal 4 (Week 1): The pt will replicate a pattern after verbal instruction and a demonstration 4 of 5 occurrences. OT Short Term Goal 5 (Week 1): The pt will complete sit to stands with MinA incorporating with the RW with good anatomical positioning.  Recommendations for other services: None    Skilled Therapeutic Intervention  The patient was in bed at the time of arrival with family present. Patient in good spirits with a willingness to participate. The pt present with challenges in relation to functional balance during standing presenting with a heavy posterior lean resulting in  an increase risk for falls. The pt is able to complete BADL related task  in bathing and dressing UB at Min to ModA , she demonstrates Mod to Max for  LB task performance. The pt has challenge with cognition impacting short- term memory, problem solving, and safety awareness. The  patient demonstrates MaxA for toileting and she is ModA  to MaxA for functional transfers to all surface. I recommend 12-14 day of inpatient Occupational Therapy services  with the current goals in place to improve upon functional independence and  to reduce the  burden of care for the care providers with HHS in place to assist with transitional care.  Modifications to be made in the plan of care as needed to address residual deficits.  Patient to receive 12-14 days of OT services.  ADL ADL Equipment Provided: Reacher Eating: Set up (s/u A to MinA) Where Assessed-Eating:  (based on functional task involving reach) Grooming: Setup (s/u A to MinA) Where Assessed-Grooming: Sitting at sink Upper Body Bathing: Minimal assistance Where Assessed-Upper Body Bathing: Shower Lower Body Bathing: Minimal assistance;Moderate assistance Where Assessed-Lower Body Bathing: Shower Upper Body Dressing: Minimal assistance Where Assessed-Upper Body Dressing: Other (Comment) (Bathroom) Lower Body Dressing: Moderate assistance;Maximal assistance Where Assessed-Lower Body Dressing:  (bathroom) Toileting: Maximal assistance Where Assessed-Toileting: Teacher, adult education: Moderate assistance (heavy posterior lean) Toilet Transfer Method: Ambulating (using the RW and grab bars  for transferring with heavy posterior lean) Toilet Transfer Equipment: Grab bars Tub/Shower Transfer: Moderate assistance Tub/Shower Transfer Method: Ambulating (RW and grab bars.) Tub/Shower Equipment: Shower seat with back;Grab bars Film/video editor: Moderate assistance Film/video editor Method: Manufacturing systems engineer with back ADL Comments: arm rails on the seat, 3 in 1 seating device Mobility  Bed Mobility Bed Mobility: Rolling Right;Rolling Left;Sit to Supine;Supine to Sit Rolling Right: Minimal Assistance - Patient > 75% Rolling Left: Minimal Assistance - Patient > 75% Supine to Sit: Moderate Assistance - Patient 50-74% Sit to Supine: Moderate Assistance - Patient 50-74% Transfers Sit to Stand: Moderate Assistance - Patient 50-74% Stand to Sit: Moderate Assistance - Patient 50-74%   Discharge Criteria: Patient will be discharged from OT if  patient refuses treatment 3 consecutive times without medical reason, if treatment goals not met, if there is a change in medical status, if patient makes no progress towards goals or if patient is discharged from hospital.  The above assessment, treatment plan, treatment alternatives and goals were discussed and mutually agreed upon: by patient and by family  Elvera JONETTA Mace 12/06/2023, 7:25 PM

## 2023-12-06 NOTE — Evaluation (Signed)
 Physical Therapy Assessment and Plan  Patient Details  Name: [name] [name] MRN: [id number] Date of Birth: 05/09/1940  PT Diagnosis: Cognitive deficits, Difficulty walking, Impaired cognition, and Muscle weakness Rehab Potential: Fair ELOS: 12 to 14 days   Today's Date: 12/06/2023 PT Individual Time: 0802-0910 PT Individual Time Calculation (min): 68 min    Hospital Problem: Principal Problem:   Intraparenchymal hemorrhage of brain [facility])   Past Medical History:  Past Medical History:  Diagnosis Date   Acute kidney failure (HCC)    5 years ago   ACUTE KIDNEY FAILURE UNSPECIFIED 07/12/2009   Allergy    Anemia    Anxiety    Arthritis    Basal cell carcinoma    Blood clot in vein    states it was not deep vein   CAD (coronary artery disease)    Mild nonobstructive by CT   Cataract    Cerebral aneurysm without rupture 2021   Chronic kidney disease    stones   Depression    around the time of her husband's death   Diverticulosis of colon (without mention of hemorrhage)    Fibromyalgia    GERD (gastroesophageal reflux disease)    Headache    Hiatal hernia    History of kidney stones    Hyperlipidemia    Hypertension    Interstitial cystitis    Kidney stone    Osteopenia    PONV (postoperative nausea and vomiting)    Scoliosis    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   Qualifier: Diagnosis of  By: Jakie MD NOLIA Alm SAUNDERS    Vertigo    Past Surgical History:  Past Surgical History:  Procedure Laterality Date   BLADDER TUMOR EXCISION  2001   CATARACT EXTRACTION     bilateral   CHOLECYSTECTOMY     COLOSTOMY     CYSTECTOMY  2001   benign   ESOPHAGEAL MANOMETRY N/A 07/13/2012   Procedure: ESOPHAGEAL MANOMETRY (EM);  Surgeon: Alm SAUNDERS Jakie, MD;  Location: [facility];  Service: Endoscopy;  Laterality: N/A;   HIATAL HERNIA REPAIR     IR ANGIO INTRA EXTRACRAN SEL INTERNAL CAROTID BILAT MOD SED  11/02/2019   IR ANGIO INTRA EXTRACRAN SEL INTERNAL CAROTID BILAT MOD SED   10/31/2020   IR ANGIO VERTEBRAL SEL VERTEBRAL BILAT MOD SED  12/17/2019   IR ANGIO VERTEBRAL SEL VERTEBRAL UNI L MOD SED  10/31/2020   IR ANGIO VERTEBRAL SEL VERTEBRAL UNI R MOD SED  11/02/2019   IR ANGIOGRAM FOLLOW UP STUDY  12/17/2019   IR ANGIOGRAM FOLLOW UP STUDY  12/17/2019   IR ANGIOGRAM FOLLOW UP STUDY  12/17/2019   IR ANGIOGRAM FOLLOW UP STUDY  12/17/2019   IR NEURO EACH ADD'L AFTER BASIC UNI LEFT (MS)  12/17/2019   IR TRANSCATH/EMBOLIZ  12/17/2019   LUMBAR DISC SURGERY     x 2 elsner   RADIOLOGY WITH ANESTHESIA N/A 12/17/2019   Procedure: 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;  Surgeon: Lanis Pupa, MD;  Location[facility] OR;  Service: Radiology;  Laterality: N/A;   REVERSE SHOULDER ARTHROPLASTY Right 08/12/2014   Procedure: RIGHT REVERSE TOTAL SHOULDER ARTHROPLASTY;  Surgeon: Marcey Her, MD;  Location: [facility] OR;  Service: Orthopedics;  Laterality: Right;   TENDON REPAIR Right 11/2005   right shoulder   TOTAL KNEE ARTHROPLASTY Left    left   TOTAL KNEE ARTHROPLASTY Right 11/17/2015   Procedure: RIGHT TOTAL KNEE ARTHROPLASTY;  Surgeon: Lamar Collet, MD;  Location: [facility] ORS;  Service: Orthopedics;  Laterality: Right;   UPPER GASTROINTESTINAL ENDOSCOPY      Assessment & Plan Clinical Impression: Patient is an 83 year old right right-handed female with history significant for dementia maintained on Namenda  , hyperlipidemia, GERD, hypertension, CAD, fibromyalgia, interstitial cystitis, basilar tip aneurysm status post embolization 2021, bilateral total knee replacement. Per chart review patient lives alone. 1 level home one-step to entry. She does use a rolling walker for balance. Daughter with good support and provides meals. Independent in basic self-care. Patient does not drive. Presented 12/01/2023 with progressive decrease in ambulation as well as intermittent bouts of confusion x 1 week. Blood pressure 159/113. Daughter reports that over the past week  patient has been less talkative and stumbling with ambulation and was dragging her left leg. She denied any recent fall. Cranial CT scan showed acute intraparenchymal hematoma involving the superior frontal gyrus of the right frontal lobe measuring 3.1 x 3.6 x 1.5 cm in greatest dimension. Mild mass effect with effacement of the overlying sulcal. No midline shift. Embolization coils in the region of the basilar tip. MRI revealed 2 acute/subacute intra-axial hemorrhages in the right hemisphere. Underlying microhemorrhages in the left parietal occipital lobes appeared somewhat increased from MRI of last year. Mild associated cerebral edema no significant intracranial mass effect or complicating features. MRA with no large vessel occlusion. Admission chemistries unremarkable, urinalysis negative nitrite. EEG negative for seizure. Echo card gram ejection fraction of 65 to 70% grade 1 diastolic dysfunction. No regional wall motion abnormalities. Initially placed on Cleviprex  for blood pressure control. Patient was cleared to begin subcutaneous heparin  for DVT prophylaxis 12/03/2023. Tolerating a regular diet.   Patient transferred to CIR on 12/05/2023 .   Patient currently requires mod with mobility secondary to muscle weakness, decreased cardiorespiratoy endurance, decreased coordination, and decreased awareness, decreased problem solving, decreased safety awareness, and decreased memory.  Prior to hospitalization, patient was modified independent  with mobility and lived with Alone in a House home.  Home access is 1Stairs to enter.  Patient will benefit from skilled PT intervention to maximize safe functional mobility, minimize fall risk, and decrease caregiver burden for planned discharge home with 24 hour supervision.  Anticipate patient will benefit from follow up HH at discharge.  PT - End of Session Activity Tolerance: Tolerates 30+ min activity with multiple rests Endurance Deficit: Yes PT  Assessment Rehab Potential (ACUTE/IP ONLY): Fair PT Barriers to Discharge: Decreased caregiver support;Inaccessible home environment PT Patient demonstrates impairments in the following area(s): Balance;Endurance;Safety;Motor PT Transfers Functional Problem(s): Bed Mobility;Bed to Chair;Car PT Locomotion Functional Problem(s): Ambulation;Stairs PT Plan PT Intensity: Minimum of 1-2 x/day ,45 to 90 minutes PT Frequency: 5 out of 7 days PT Duration Estimated Length of Stay: 12 to 14 days PT Treatment/Interventions: Ambulation/gait training;Balance/vestibular training;Community reintegration;Discharge planning;DME/adaptive equipment instruction;Functional mobility training;Neuromuscular re-education;Patient/family education;Stair training;Therapeutic Activities;Therapeutic Exercise;UE/LE Coordination activities;UE/LE Strength taining/ROM PT Transfers Anticipated Outcome(s): S transfers PT Locomotion Anticipated Outcome(s): S gait, contact gaurd stairs PT Recommendation Recommendations for Other Services: None Follow Up Recommendations: Home health PT Patient destination: Home Equipment Recommended: To be determined   PT Evaluation Precautions/Restrictions Precautions Precautions: Fall, SBP < 160 Recall of Precautions/Restrictions: Impaired Restrictions Weight Bearing Restrictions Per Provider Order: No General Chart Reviewed: Yes Family/Caregiver Present: No  Vital Signs Therapy Vitals Pulse Rate: 72 BP: (!) 195/117 Pain Pain Assessment Pain Scale: 0-10 Pain Score: 0-No pain Pain Interference Pain Interference Pain Effect on Sleep: 1. Rarely or not at all Pain Interference with Therapy Activities: 1. Rarely or not at all Pain Interference  with Day-to-Day Activities: 1. Rarely or not at all Home Living/Prior Functioning Home Living Available Help at Discharge: Family;Available PRN/intermittently Type of Home: House Home Access: Stairs to enter [employer] of  Steps: 1 Home Layout: One level  Lives With: Alone Vision/Perception  Perception Perception: Within Functional Limits  Cognition Overall Cognitive Status: Impaired/Different from baseline Arousal/Alertness: Awake/alert Orientation Level: Oriented to person;Oriented to place;Disoriented to time;Oriented to situation Year: Other (Comment) (unable to name the year) Month:  (unable to name the month) Day of Week: Correct (able to name correct day when asked if it as Friday, Saturday or Sunday, pt said it was Saturday) Memory: Impaired Memory Impairment: Decreased recall of new information;Decreased short term memory Awareness: Impaired Problem Solving: Impaired Safety/Judgment: Impaired Sensation Sensation Light Touch: Appears Intact Coordination Gross Motor Movements are Fluid and Coordinated: No Motor  Motor Motor - Skilled Clinical Observations: L sided weakness  Mobility Bed Mobility Bed Mobility: Rolling Right;Rolling Left;Sit to Supine;Supine to Sit Rolling Right: Minimal Assistance - Patient > 75% Rolling Left: Minimal Assistance - Patient > 75% Supine to Sit: Moderate Assistance - Patient 50-74% Sit to Supine: Moderate Assistance - Patient 50-74% Transfers Transfers: Stand to Sit;Sit to Stand;Stand Pivot Transfers Sit to Stand: Moderate Assistance - Patient 50-74% Stand to Sit: Moderate Assistance - Patient 50-74% Stand Pivot Transfers: Moderate Assistance - Patient 50 - 74% Transfer (Assistive device): None Locomotion Gait Ambulation: Yes Gait Assistance: Moderate Assistance - Patient 50-74% Gait Distance (Feet): 2 Feet Assistive device: None Stairs / Additional Locomotion Stairs: No (to be assessed) Wheelchair Mobility Wheelchair Mobility: Yes Wheelchair Assistance: Dependent - Patient 0% Distance: 150 Trunk/Postural Assessment  Cervical Assessment Cervical Assessment: Exceptions to [facility] (forward head posture) Thoracic Assessment Thoracic Assessment:  Exceptions to [facility] (rounded shoulders) Lumbar Assessment Lumbar Assessment: Exceptions to [facility] (posterior pelvic tilt) Postural Control Postural Control: Deficits on evaluation (delayed)  Balance Balance Balance Assessed: Yes Static Sitting Balance Static Sitting - Balance Support: Feet supported;Bilateral upper extremity supported Static Sitting - Level of Assistance: 5: Stand by assistance Static Standing Balance Static Standing - Balance Support: During functional activity Static Standing - Level of Assistance: 3: Mod assist Dynamic Standing Balance Dynamic Standing - Balance Support: During functional activity Dynamic Standing - Level of Assistance: 3: Mod assist Extremity Assessment  B UEs as per OT evaluation RLE Assessment RLE Assessment: Within Functional Limits LLE Assessment LLE Assessment: Exceptions to [facility] Passive Range of Motion (PROM) Comments: WFLs except limited DF General Strength Comments: grossly 2+/5 to 3-/5  Care Tool Care Tool Bed Mobility Roll left and right activity   Roll left and right assist level: Minimal Assistance - Patient > 75%    Sit to lying activity   Sit to lying assist level: Moderate Assistance - Patient 50 - 74%    Lying to sitting on side of bed activity   Lying to sitting on side of bed assist level: the ability to move from lying on the back to sitting on the side of the bed with no back support.: Moderate Assistance - Patient 50 - 74%     Care Tool Transfers Sit to stand transfer   Sit to stand assist level: Moderate Assistance - Patient 50 - 74%    Chair/bed transfer   Chair/bed transfer assist level: Moderate Assistance - Patient 50 - 74%    Car transfer Car transfer activity did not occur: Safety/medical concerns        Care Tool Locomotion Ambulation Ambulation activity did not occur: Safety/medical concerns  Walk 10 feet activity Walk 10 feet activity did not occur: Safety/medical concerns       Walk 50 feet  with 2 turns activity Walk 50 feet with 2 turns activity did not occur: Safety/medical concerns      Walk 150 feet activity Walk 150 feet activity did not occur: Safety/medical concerns      Walk 10 feet on uneven surfaces activity Walk 10 feet on uneven surfaces activity did not occur: Safety/medical concerns      Stairs Stair activity did not occur: Safety/medical concerns        Walk up/down 1 step activity Walk up/down 1 step or curb (drop down) activity did not occur: Safety/medical concerns      Walk up/down 4 steps activity Walk up/down 4 steps activity did not occur: Safety/medical concerns      Walk up/down 12 steps activity Walk up/down 12 steps activity did not occur: Safety/medical concerns      Pick up small objects from floor Pick up small object from the floor (from standing position) activity did not occur: Safety/medical concerns      Wheelchair Is the patient using a wheelchair?: No Type of Wheelchair: Manual   Wheelchair assist level: Dependent - Patient 0% Max wheelchair distance: 150  Wheel 50 feet with 2 turns activity   Assist Level: Dependent - Patient 0%  Wheel 150 feet activity   Assist Level: Dependent - Patient 0%    Refer to Care Plan for Long Term Goals  SHORT TERM GOAL WEEK 1 PT Short Term Goal 1 (Week 1): Pt will increase bed mobility to min A. PT Short Term Goal 2 (Week 1): Pt will increase transfers to min A. PT Short Term Goal 3 (Week 1): Pt will ambulate 50 feet with LRAD and min A PT Short Term Goal 4 (Week 1): Pt will ascend/descend 4 stairs with B rails and min A PT Short Term Goal 5 (Week 1): Pt will increase standing balance to min A  Recommendations for other services: None   Skilled Therapeutic Intervention PT evaluation completed and treatment plan initiated. Pt's BP in the am was 159/90, checked by nursing. PT evaluation initiated and pt transferred out of chair and transported to rehab gym. Pt's BP sitting up in w/c 176/87  with HR 72. Pt transported back to room and BP retaken it was 163/84 with HR 73. Notified pt's nurse of BP. Pt transferred sit to stand with mod A, pts BP in standing was 140/86. Pt took several steps to sit on edge of bed. Pt's BP 180/93. Pt transferred edge of bed to supine with mod A. Pt's supine in bed was 195/117. Pt's nurse at bedside to give AM meds. Pt's nurse also notified MD. Pt left sitting up in bed with all needs within reach and bed alarm on.     Discharge Criteria: Patient will be discharged from PT if patient refuses treatment 3 consecutive times without medical reason, if treatment goals not met, if there is a change in medical status, if patient makes no progress towards goals or if patient is discharged from hospital.  The above assessment, treatment plan, treatment alternatives and goals were discussed and mutually agreed upon: by patient  Merilee Lynwood MATSU 12/06/2023, 12:58 PM

## 2023-12-06 NOTE — Progress Notes (Signed)
 NT reported to writer patient's blood pressure was low prior to session with Speech Therapy. Vitals rechecked after therapy session. TED hose placed. While placing TED hose on patient observed bilateral feet to be purple, cold, and + 0 with dorsalis pedis bilateral. +1 posterior tibial pulse bilateral. Decrease sensation with feet. Feet elevated. Provider informed and made aware. Provider requesting TED hose removed.

## 2023-12-06 NOTE — Plan of Care (Signed)
  Problem: RH Cognition - SLP Goal: RH LTG Patient will demonstrate orientation with cues Description:  LTG:  Patient will demonstrate orientation to person/place/time/situation with cues (SLP)   Flowsheets (Taken 12/06/2023 1340) LTG Patient will demonstrate orientation to:  Person  Place  Time  Situation LTG: Patient will demonstrate orientation using cueing (SLP): Minimal Assistance - Patient > 75%   Problem: RH Problem Solving Goal: LTG Patient will demonstrate problem solving for (SLP) Description: LTG:  Patient will demonstrate problem solving for basic/complex daily situations with cues  (SLP) Flowsheets (Taken 12/06/2023 1340) LTG: Patient will demonstrate problem solving for (SLP): Basic daily situations LTG Patient will demonstrate problem solving for: Minimal Assistance - Patient > 75%   Problem: RH Memory Goal: LTG Patient will use memory compensatory aids to (SLP) Description: LTG:  Patient will use memory compensatory aids to recall biographical/new, daily complex information with cues (SLP) Flowsheets (Taken 12/06/2023 1340) LTG: Patient will use memory compensatory aids to (SLP): Minimal Assistance - Patient > 75%   Problem: RH Attention Goal: LTG Patient will demonstrate this level of attention during functional activites (SLP) Description: LTG:  Patient will will demonstrate this level of attention during functional activites (SLP) Flowsheets (Taken 12/06/2023 1340) Patient will demonstrate during cognitive/linguistic activities the attention type of: Sustained Patient will demonstrate this level of attention during cognitive/linguistic activities in: Controlled LTG: Patient will demonstrate this level of attention during cognitive/linguistic activities with assistance of (SLP): Minimal Assistance - Patient > 75% Number of minutes patient will demonstrate attention during cognitive/linguistic activities: 10

## 2023-12-06 NOTE — Progress Notes (Signed)
 Inpatient Rehabilitation Admission Medication Review by a Pharmacist  A complete drug regimen review was completed for this patient to identify any potential clinically significant medication issues.  High Risk Drug Classes Is patient taking? Indication by Medication  Antipsychotic No   Anticoagulant Yes Sq heparin  - VTE ppx  Antibiotic No   Opioid No   Antiplatelet No   Hypoglycemics/insulin No   Vasoactive Medication Yes Amlodipine , Metoprolol - HTN  Chemotherapy No   Other Yes Duloxetine  - mood Memantine  - AD Rosuvastatin  - HLD     Type of Medication Issue Identified Description of Issue Recommendation(s)  Drug Interaction(s) (clinically significant)     Duplicate Therapy     Allergy     No Medication Administration End Date     Incorrect Dose     Additional Drug Therapy Needed     Significant med changes from prior encounter (inform family/care partners about these prior to discharge).    Other       Clinically significant medication issues were identified that warrant physician communication and completion of prescribed/recommended actions by midnight of the next day:  No  Name of provider notified for urgent issues identified:   Provider Method of Notification:     Pharmacist comments: None  Time spent performing this drug regimen review (minutes):  20 minutes  Thank you. Olam Monte, PharmD

## 2023-12-06 NOTE — Evaluation (Signed)
 Speech Language Pathology Assessment and Plan  Patient Details  Name: [name] [name] MRN: [id number] Date of Birth: 11/06/1940  SLP Diagnosis: Cognitive Impairments  Rehab Potential: Fair ELOS: 12-16 days    Today's Date: 12/06/2023 SLP Individual Time: 1253-1350 SLP Individual Time Calculation (min): 57 min   Hospital Problem: Principal Problem:   Intraparenchymal hemorrhage of brain [facility])  Past Medical History:  Past Medical History:  Diagnosis Date   Acute kidney failure (HCC)    5 years ago   ACUTE KIDNEY FAILURE UNSPECIFIED 07/12/2009   Allergy    Anemia    Anxiety    Arthritis    Basal cell carcinoma    Blood clot in vein    states it was not deep vein   CAD (coronary artery disease)    Mild nonobstructive by CT   Cataract    Cerebral aneurysm without rupture 2021   Chronic kidney disease    stones   Depression    around the time of her husband's death   Diverticulosis of colon (without mention of hemorrhage)    Fibromyalgia    GERD (gastroesophageal reflux disease)    Headache    Hiatal hernia    History of kidney stones    Hyperlipidemia    Hypertension    Interstitial cystitis    Kidney stone    Osteopenia    PONV (postoperative nausea and vomiting)    Scoliosis    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   Qualifier: Diagnosis of  By: Jakie MD NOLIA Alm SAUNDERS    Vertigo    Past Surgical History:  Past Surgical History:  Procedure Laterality Date   BLADDER TUMOR EXCISION  2001   CATARACT EXTRACTION     bilateral   CHOLECYSTECTOMY     COLOSTOMY     CYSTECTOMY  2001   benign   ESOPHAGEAL MANOMETRY N/A 07/13/2012   Procedure: ESOPHAGEAL MANOMETRY (EM);  Surgeon: Alm SAUNDERS Jakie, MD;  Location: [facility];  Service: Endoscopy;  Laterality: N/A;   HIATAL HERNIA REPAIR     IR ANGIO INTRA EXTRACRAN SEL INTERNAL CAROTID BILAT MOD SED  11/02/2019   IR ANGIO INTRA EXTRACRAN SEL INTERNAL CAROTID BILAT MOD SED  10/31/2020   IR ANGIO VERTEBRAL SEL VERTEBRAL  BILAT MOD SED  12/17/2019   IR ANGIO VERTEBRAL SEL VERTEBRAL UNI L MOD SED  10/31/2020   IR ANGIO VERTEBRAL SEL VERTEBRAL UNI R MOD SED  11/02/2019   IR ANGIOGRAM FOLLOW UP STUDY  12/17/2019   IR ANGIOGRAM FOLLOW UP STUDY  12/17/2019   IR ANGIOGRAM FOLLOW UP STUDY  12/17/2019   IR ANGIOGRAM FOLLOW UP STUDY  12/17/2019   IR NEURO EACH ADD'L AFTER BASIC UNI LEFT (MS)  12/17/2019   IR TRANSCATH/EMBOLIZ  12/17/2019   LUMBAR DISC SURGERY     x 2 [facility]   RADIOLOGY WITH ANESTHESIA N/A 12/17/2019   Procedure: 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;  Surgeon: Lanis Pupa, MD;  Location: [facility] OR;  Service: Radiology;  Laterality: N/A;   REVERSE SHOULDER ARTHROPLASTY Right 08/12/2014   Procedure: RIGHT REVERSE TOTAL SHOULDER ARTHROPLASTY;  Surgeon: Marcey Her, MD;  Location: [facility] OR;  Service: Orthopedics;  Laterality: Right;   TENDON REPAIR Right 11/2005   right shoulder   TOTAL KNEE ARTHROPLASTY Left    left   TOTAL KNEE ARTHROPLASTY Right 11/17/2015   Procedure: RIGHT TOTAL KNEE ARTHROPLASTY;  Surgeon: Lamar Collet, MD;  Location: [facility] ORS;  Service: Orthopedics;  Laterality: Right;   UPPER GASTROINTESTINAL ENDOSCOPY  Assessment / Plan / Recommendation Clinical Impression  Pt is an 83 y/o female with PMH of CAD, CKD, HTN, fibromyalgia, basilar tip aneurysm s/p embolization in 2021, and interstitial cystitis who presented to [facility] on 8/12 with difficulty ambulating. CT head showed acute right frontal hemorrhage with mild mass effect with effacement of the overlying sulci. No MLS. CTA head/neck no acute hemorrhage or ischemic change but did show 2 mm aneurysm in the [place] artery as well as moderate to severe focal stenosis at the junction of the left P1 and P2 segment. MRI confirmed intra axial hemorrhages in the right hemisphere including the superior frontal gyrus, right inferior frontal gyrus, and underlying microhemorrhages in the left parietal and frontal  lobes which appear increased from previous imaging. ECHO showed EF 60-70%. She did require cleviprex  for tight BP control, goal <160. Therapy evaluations completed and pt was recommended for CIR due to functional decline.   Cognitive-Linguistic Evaluation: Patients presents with moderate cognitive-linguistic deficits in the areas of memory, problem solving, attention, orientation, awareness, word finding, sequencing, and topic maintenance. Throughout session, SLP administered portions of the AFLA, though unable to complete most subsections due to severe attention deficits. Patient unable to identify times on clocks but was able to copy address onto an envelope (granted, in wrong positioning) given significantly increased time and max cues for attention to task. Patient's awareness of deficits during functional tasks resulted in anxiety and often derailed task performance due to patient's worry of lost skills. During discussion of patient's baseline status and chart review, patient was independent for ADLs but required some assistance with medication management, shopping, meal preparation, and driving. Patient's daughter lives nearby and assists with the aforementioned tasks. Motor speech and receptive language appear WFL. Expressive language is characterized by word finding deficits at the sentence level with output characterized by halting speech, though this could also be due to severe attention deficits and difficulty with topic maintenance. Patient is disoriented to time and situation timeline, though is able to independently state that she has had a stroke and that communication and completing daily tasks is subsequently more difficult. Due to severity of attention deficits, patient's ability to multitask is severely diminished with patient unable to answer any questions from food service employee re: upcoming meals while she was actively eating, requiring removal of food tray in order to attend and answer  questions with max assist. Patient would benefit from ongoing SLP services to target all aforementioned deficits. Patient left in room with call bell in reach and alarm set. SLP will continue with plan of care as indicated.   Skilled Therapeutic Interventions          SLP conducted skilled evaluation session to assess cognitive-linguistic function. Utilized COGNISTAT and patient and/or family interview. Full results above.   SLP Assessment  Patient will need skilled Speech Lanaguage Pathology Services during CIR admission    Recommendations  Recommendations for Other Services: Neuropsych consult Patient destination: Home Follow up Recommendations: Home Health SLP;24 hour supervision/assistance;Outpatient SLP Equipment Recommended: None recommended by SLP    SLP Frequency 3 to 5 out of 7 days   SLP Duration  SLP Intensity  SLP Treatment/Interventions 12-16 days  Minumum of 1-2 x/day, 30 to 90 minutes  Cognitive remediation/compensation;Cueing hierarchy;Environmental controls;Therapeutic Activities;Functional tasks;Patient/family education    Pain Pain Assessment Pain Scale: 0-10 Pain Score: 0-No pain  Prior Functioning Cognitive/Linguistic Baseline: Baseline deficits Baseline deficit details: needed assistance with meal prep, housekeeping, shopping, medication management, etc. Type of Home: House  Lives With: Alone Available Help at Discharge: Family;Available PRN/intermittently Education: High school education Vocation: Retired  SLP Evaluation Cognition Overall Cognitive Status: Impaired/Different from baseline Arousal/Alertness: Awake/alert Orientation Level: Oriented to person;Oriented to place;Disoriented to time;Oriented to situation;Oriented X4 Year: Other (Comment) (unable to state) Month:  (unable to state) Day of Week: Incorrect (sunday) Attention: Sustained Sustained Attention: Impaired Sustained Attention Impairment: Verbal basic;Functional basic Memory:  Impaired Memory Impairment: Decreased recall of new information;Decreased short term memory Decreased Short Term Memory: Verbal basic;Functional basic Awareness: Impaired Awareness Impairment: Intellectual impairment;Anticipatory impairment;Emergent impairment Problem Solving: Impaired Problem Solving Impairment: Verbal basic;Functional basic Executive Function: Reasoning;Sequencing Reasoning: Impaired Reasoning Impairment: Verbal basic Sequencing: Impaired Sequencing Impairment: Verbal basic;Functional basic Safety/Judgment: Impaired  Comprehension Auditory Comprehension Overall Auditory Comprehension: Appears within functional limits for tasks assessed Expression Expression Primary Mode of Expression: Verbal Verbal Expression Overall Verbal Expression: Impaired (halting speech, though likely due to attention deficits) Initiation: No impairment Level of Generative/Spontaneous Verbalization: Conversation Repetition: No impairment Naming: No impairment Pragmatics: No impairment Interfering Components: Attention Effective Techniques: Semantic cues Non-Verbal Means of Communication: Not applicable Written Expression Dominant Hand: Right Written Expression: Exceptions to [facility] Copy Ability: Phrase Oral Motor Oral Motor/Sensory Function Overall Oral Motor/Sensory Function: Within functional limits Motor Speech Overall Motor Speech: Appears within functional limits for tasks assessed Respiration: Within functional limits Phonation: Normal Resonance: Within functional limits Articulation: Within functional limitis Intelligibility: Intelligible Motor Planning: Within functional limits Motor Speech Errors: Not applicable  Care Tool Care Tool Cognition Ability to hear (with hearing aid or hearing appliances if normally used Ability to hear (with hearing aid or hearing appliances if normally used): 0. Adequate - no difficulty in normal conservation, social interaction, listening to  TV   Expression of Ideas and Wants Expression of Ideas and Wants: 3. Some difficulty - exhibits some difficulty with expressing needs and ideas (e.g, some words or finishing thoughts) or speech is not clear   Understanding Verbal and Non-Verbal Content Understanding Verbal and Non-Verbal Content: 4. Understands (complex and basic) - clear comprehension without cues or repetitions  Memory/Recall Ability Memory/Recall Ability : That he or she is in a hospital/hospital unit   Intelligibility: Intelligible  Short Term Goals: Week 1: SLP Short Term Goal 1 (Week 1): Patient will utilize external memory aids to recall daily information with 80% accuracy given mod assist. SLP Short Term Goal 2 (Week 1): Patient will complete basic functional problem solving tasks with 80% accuracy given mod multimodal cues. SLP Short Term Goal 3 (Week 1): Patient will maintain topic during conversation re: biographical information with mod cues from conversation partner. SLP Short Term Goal 4 (Week 1): Patient will orient to year and month using external aids in 4/5 opportunities given mod assist.  Refer to Care Plan for Long Term Goals  Recommendations for other services: Neuropsych  Discharge Criteria: Patient will be discharged from SLP if patient refuses treatment 3 consecutive times without medical reason, if treatment goals not met, if there is a change in medical status, if patient makes no progress towards goals or if patient is discharged from hospital.  The above assessment, treatment plan, treatment alternatives and goals were discussed and mutually agreed upon: by patient  Angelynn Lemus, M.A., CCC-SLP  Kerry-Anne Mezo A Xiana Carns 12/06/2023, 1:50 PM

## 2023-12-07 DIAGNOSIS — I619 Nontraumatic intracerebral hemorrhage, unspecified: Secondary | ICD-10-CM | POA: Diagnosis not present

## 2023-12-07 MED ORDER — DOCUSATE SODIUM 100 MG PO CAPS
100.0000 mg | ORAL_CAPSULE | Freq: Every day | ORAL | Status: DC
  Administered 2023-12-07 – 2023-12-09 (×3): 100 mg via ORAL
  Filled 2023-12-07 (×3): qty 1

## 2023-12-07 MED ORDER — MAGNESIUM GLUCONATE 500 (27 MG) MG PO TABS
500.0000 mg | ORAL_TABLET | Freq: Every day | ORAL | Status: DC
  Administered 2023-12-08 – 2023-12-16 (×9): 500 mg via ORAL
  Filled 2023-12-07 (×9): qty 1

## 2023-12-07 MED ORDER — VITAMIN D 25 MCG (1000 UNIT) PO TABS
1000.0000 [IU] | ORAL_TABLET | Freq: Every day | ORAL | Status: DC
  Administered 2023-12-07 – 2023-12-09 (×3): 1000 [IU] via ORAL
  Filled 2023-12-07 (×3): qty 1

## 2023-12-07 MED ORDER — SENNA 8.6 MG PO TABS
2.0000 | ORAL_TABLET | Freq: Every day | ORAL | Status: DC
  Administered 2023-12-07 – 2023-12-15 (×9): 17.2 mg via ORAL
  Filled 2023-12-07 (×9): qty 2

## 2023-12-07 NOTE — Progress Notes (Signed)
 Physical Therapy Session Note  Patient Details  Name: [name] [name] MRN: [id number] Date of Birth: 1941/02/24  Today's Date: 12/07/2023 PT Individual Time: 1100-1155 PT Individual Time Calculation (min): 55 min   Short Term Goals: Week 1:  PT Short Term Goal 1 (Week 1): Pt will increase bed mobility to min A. PT Short Term Goal 2 (Week 1): Pt will increase transfers to min A. PT Short Term Goal 3 (Week 1): Pt will ambulate 50 feet with LRAD and min A PT Short Term Goal 4 (Week 1): Pt will ascend/descend 4 stairs with B rails and min A PT Short Term Goal 5 (Week 1): Pt will increase standing balance to min A  Skilled Therapeutic Interventions/Progress Updates:  Pt was seen bedside in the am. Pt transferred supine to edge of bed with min A and verbal cues with increased time. Pt tolerated edge of bed with S and verbal cues. Pt transferred sit to stand with rolling walker and min A. Pt pivoted to w/c with rolling walker and min A with verbal cues. Pt transported to rehab gym. Pt performed car transfers with rolling walker and min to mod A with verbal cues and increased time. Pt ascended/descended 4 stairs with B rails and min to mod A with verbal cues. Pt performed multiple sit to stand and stand pivot transfers throughout treatment. Pt required increased assistance from lower surface and as pt fatigue. Pt ambulated 73 feet x 2 with rolling walker and min A. Decreased cadence, B step length and flexed knees noted. Pt returned to room. Pt transferred sit to stand x 2 with grab bar to allow brief change. Pt dependent with hygiene. Pt left sitting up in w/c with chair alarm in place and all needs within reach.   Therapy Documentation Precautions:  Precautions Precautions: Fall Recall of Precautions/Restrictions: Impaired Restrictions Weight Bearing Restrictions Per Provider Order: No General:   Vital Signs: Therapy Vitals Pulse Rate: 67 BP: 118/71 Pain: No c/o pain  Therapy/Group:  Individual Therapy  Merilee Lynwood MATSU 12/07/2023, 12:06 PM

## 2023-12-07 NOTE — Discharge Summary (Signed)
 Physician Discharge Summary  Patient ID: [name] [name] MRN: [id number] DOB/AGE: [birth year] 83 y.o.  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 coli UTI  Discharged Condition: Stable  Significant Diagnostic Studies: VAS US  LOWER EXTREMITY VENOUS (DVT) Result Date: 12/03/2023  Lower Venous DVT Study Patient Name:  [name] [name]  Date of Exam:   12/03/2023 Medical Rec #: [id number]     Accession #:    [id number] Date of Birth: 07-23-1940     Patient Gender: F Patient Age:   83 years Exam Location:  [facility] Procedure:      VAS US  LOWER EXTREMITY VENOUS (DVT) Referring Phys: PRAMOD SETHI --------------------------------------------------------------------------------  Indications: Stroke.  Comparison Study: No previous exams available for review. Performing Technologist: Ezzie Potters RVT, RDMS  Examination Guidelines: A complete evaluation includes B-mode imaging, spectral Doppler, color Doppler, and power Doppler as needed of all accessible portions of each vessel. Bilateral testing is considered an integral part of a complete examination. Limited examinations for reoccurring indications may be performed as noted. The reflux portion of the exam is performed with the patient in reverse Trendelenburg.  +---------+---------------+---------+-----------+----------+--------------+ RIGHT    CompressibilityPhasicitySpontaneityPropertiesThrombus Aging +---------+---------------+---------+-----------+----------+--------------+ CFV      Full           Yes      Yes                                  +---------+---------------+---------+-----------+----------+--------------+ SFJ      Full                                                        +---------+---------------+---------+-----------+----------+--------------+ FV Prox  Full           Yes      Yes                                 +---------+---------------+---------+-----------+----------+--------------+ FV Mid   Full           Yes      Yes                                 +---------+---------------+---------+-----------+----------+--------------+ FV DistalFull           Yes      Yes                                 +---------+---------------+---------+-----------+----------+--------------+ PFV      Full                                                        +---------+---------------+---------+-----------+----------+--------------+ POP      Full  Yes      Yes                                 +---------+---------------+---------+-----------+----------+--------------+ PTV      Full                                                        +---------+---------------+---------+-----------+----------+--------------+   +---------+---------------+---------+-----------+----------+--------------+ LEFT     CompressibilityPhasicitySpontaneityPropertiesThrombus Aging +---------+---------------+---------+-----------+----------+--------------+ CFV      Full           Yes      Yes                                 +---------+---------------+---------+-----------+----------+--------------+ SFJ      Full                                                        +---------+---------------+---------+-----------+----------+--------------+ FV Prox  Full           Yes      Yes                                 +---------+---------------+---------+-----------+----------+--------------+ FV Mid   Full           Yes      Yes                                  +---------+---------------+---------+-----------+----------+--------------+ FV DistalFull           Yes      Yes                                 +---------+---------------+---------+-----------+----------+--------------+ PFV      Full                                                        +---------+---------------+---------+-----------+----------+--------------+ POP      Full           Yes      Yes                                 +---------+---------------+---------+-----------+----------+--------------+ PTV      Full                                                        +---------+---------------+---------+-----------+----------+--------------+ PERO     Full                                                        +---------+---------------+---------+-----------+----------+--------------+  Summary: BILATERAL: - No evidence of deep vein thrombosis seen in the lower extremities, bilaterally. -No evidence of popliteal cyst, bilaterally.   *See table(s) above for measurements and observations. Electronically signed by Fonda Rim on 12/03/2023 at 6:47:44 PM.    Final    ECHOCARDIOGRAM COMPLETE Result Date: 12/02/2023    ECHOCARDIOGRAM REPORT   Patient Name:   [name] [name] Date of Exam: 12/02/2023 Medical Rec #:  [id number]    Height:       63.0 in Accession #:    [id number]   Weight:       117.9 lb Date of Birth:  03/18/1941    BSA:          1.545 m Patient Age:    83 years     BP:           135/71 mmHg Patient Gender: F            HR:           81 bpm. Exam Location:  Inpatient Procedure: 2D Echo, Cardiac Doppler and Color Doppler (Both Spectral and Color            Flow Doppler were utilized during procedure). Indications:    Stroke  History:        Patient has no prior history of Echocardiogram examinations.                 Stroke; Risk Factors:Hypertension and Dyslipidemia.  Sonographer:    Therisa Crouch Referring Phys: [id number] [facility] IMPRESSIONS  1. Left  ventricular ejection fraction, by estimation, is 65 to 70%. The left ventricle has normal function. The left ventricle has no regional wall motion abnormalities. Left ventricular diastolic parameters are consistent with Grade I diastolic dysfunction (impaired relaxation).  2. Right ventricular systolic function is normal. The right ventricular size is normal. Tricuspid regurgitation signal is inadequate for assessing PA pressure.  3. The mitral valve is grossly normal. Trivial mitral valve regurgitation. No evidence of mitral stenosis.  4. The aortic valve is tricuspid. Aortic valve regurgitation is not visualized. No aortic stenosis is present.  5. The inferior vena cava is normal in size with greater than 50% respiratory variability, suggesting right atrial pressure of 3 mmHg. Conclusion(s)/Recommendation(s): No intracardiac source of embolism detected on this transthoracic study. Consider a transesophageal echocardiogram to exclude cardiac source of embolism if clinically indicated. FINDINGS  Left Ventricle: Left ventricular ejection fraction, by estimation, is 65 to 70%. The left ventricle has normal function. The left ventricle has no regional wall motion abnormalities. The left ventricular internal cavity size was normal in size. There is  no left ventricular hypertrophy. Left ventricular diastolic parameters are consistent with Grade I diastolic dysfunction (impaired relaxation). Right Ventricle: The right ventricular size is normal. No increase in right ventricular wall thickness. Right ventricular systolic function is normal. Tricuspid regurgitation signal is inadequate for assessing PA pressure. Left Atrium: Left atrial size was normal in size. Right Atrium: Right atrial size was normal in size. Pericardium: There is no evidence of pericardial effusion. Presence of epicardial fat layer. Mitral Valve: The mitral valve is grossly normal. Trivial mitral valve regurgitation. No evidence of mitral valve  stenosis. Tricuspid Valve: The tricuspid valve is grossly normal. Tricuspid valve regurgitation is trivial. No evidence of tricuspid stenosis. Aortic Valve: The aortic valve is tricuspid. Aortic valve regurgitation is not visualized. No aortic stenosis is present. Aortic valve mean gradient measures 4.0 mmHg. Aortic valve peak gradient measures 8.0 mmHg. Aortic  valve area, by VTI measures 2.19 cm. Pulmonic Valve: The pulmonic valve was grossly normal. Pulmonic valve regurgitation is not visualized. No evidence of pulmonic stenosis. Aorta: The aortic root and ascending aorta are structurally normal, with no evidence of dilitation. Venous: The right lower pulmonary vein is normal. The inferior vena cava is normal in size with greater than 50% respiratory variability, suggesting right atrial pressure of 3 mmHg. IAS/Shunts: The atrial septum is grossly normal.  LEFT VENTRICLE PLAX 2D LVIDd:         3.40 cm   Diastology LVIDs:         2.10 cm   LV e' medial:    5.55 cm/s LV PW:         1.10 cm   LV E/e' medial:  8.1 LV IVS:        1.00 cm   LV e' lateral:   6.20 cm/s LVOT diam:     1.90 cm   LV E/e' lateral: 7.3 LV SV:         61 LV SV Index:   40 LVOT Area:     2.84 cm  RIGHT VENTRICLE          IVC RV Basal diam:  2.80 cm  IVC diam: 1.30 cm TAPSE (M-mode): 1.8 cm LEFT ATRIUM             Index LA diam:        2.80 cm 1.81 cm/m LA Vol (A2C):   21.7 ml 14.05 ml/m LA Vol (A4C):   43.3 ml 28.03 ml/m LA Biplane Vol: 31.3 ml 20.26 ml/m  AORTIC VALVE AV Area (Vmax):    2.17 cm AV Area (Vmean):   2.21 cm AV Area (VTI):     2.19 cm AV Vmax:           141.00 cm/s AV Vmean:          92.700 cm/s AV VTI:            0.280 m AV Peak Grad:      8.0 mmHg AV Mean Grad:      4.0 mmHg LVOT Vmax:         108.00 cm/s LVOT Vmean:        72.300 cm/s LVOT VTI:          0.216 m LVOT/AV VTI ratio: 0.77  AORTA Ao Root diam: 2.80 cm Ao Asc diam:  2.70 cm MITRAL VALVE MV Area (PHT): 4.83 cm    SHUNTS MV Decel Time: 157 msec    Systemic VTI:   0.22 m MV E velocity: 45.10 cm/s  Systemic Diam: 1.90 cm MV A velocity: 79.10 cm/s MV E/A ratio:  0.57 Darryle Decent MD Electronically signed by Darryle Decent MD Signature Date/Time: 12/02/2023/3:14:32 PM    Final    EEG adult Result Date: 12/02/2023 Shelton Arlin KIDD, MD     12/02/2023 12:38 PM Patient Name: [name] [name] MRN: [id number] Epilepsy Attending: Arlin KIDD Shelton Referring Physician/Provider: Vanessa Robert, MD Date: 12/02/2023 Duration: 22.40 seconds Patient history: 83 y.o. female with hx of dementia, GERD, hypertension, CAD, fibromyalgia, basilar tip aneurysm s/p embolization in 2021 who is brought in with about a week and a half history of confusion and difficulty with ambulation. EEG to evaluate for seizure. Level of alertness: Awake AEDs during EEG study: None Technical aspects: This EEG study was done with scalp electrodes positioned according to the 10-20 International system of electrode placement. Electrical activity was reviewed with band pass  filter of 1-70Hz , sensitivity of 7 uV/mm, display speed of 74mm/sec with a 60Hz  notched filter applied as appropriate. EEG data were recorded continuously and digitally stored.  Video monitoring was available and reviewed as appropriate. Description: The posterior dominant rhythm consists of 8 Hz activity of moderate voltage (25-35 uV) seen predominantly in posterior head regions, symmetric and reactive to eye opening and eye closing. Hyperventilation and photic stimulation were not performed.   IMPRESSION: This study is within normal limits. No seizures or epileptiform discharges were seen throughout the recording. A normal interictal EEG does not exclude the diagnosis of epilepsy. Arlin MALVA Krebs   CT ANGIO HEAD NECK W WO CM Result Date: 12/02/2023 EXAM: CTA Head and Neck with Intravenous Contrast. CT Head without Contrast. CLINICAL HISTORY: Neuro deficit, acute, stroke suspected; Stroke, hemorrhagic; follow up non contrast CT Head and a CT  Angio of head and neck. TECHNIQUE: Axial CTA images of the head and neck performed with intravenous contrast. MIP reconstructed images were created and reviewed. Axial computed tomography images of the head/brain performed without intravenous contrast. Note: Per PQRS, the description of internal carotid artery percent stenosis, including 0 percent or normal exam, is based on [place] American Symptomatic Carotid Endarterectomy Trial (NASCET) criteria. Dose reduction technique was used including one or more of the following: automated exposure control, adjustment of mA and kV according to patient size, and/or iterative reconstruction. CONTRAST: 75 mL of iohexol  (OMNIPAQUE ) 350 MG/ML injection. COMPARISON: MRA of the head dated 12/02/2023 and a conventional cerebral arteriogram dated 12/01/2020. FINDINGS: CT HEAD: BRAIN: No acute intraparenchymal hemorrhage. No mass lesion. No CT evidence for acute territorial infarct. No midline shift or extra-axial collection. VENTRICLES: No hydrocephalus. ORBITS: The orbits are unremarkable. SINUSES AND MASTOIDS: The paranasal sinuses and mastoid air cells are clear. CTA NECK: COMMON CAROTID ARTERIES: No significant stenosis. No dissection or occlusion. Moderate calcific plaque present within the aortic arch which appears normal in caliber. There is 3-vessel origin of the great arteries. INTERNAL CAROTID ARTERIES: No stenosis by NASCET criteria. No dissection or occlusion. The right internal carotid artery is tortuous. The left internal carotid artery is also mildly tortuous. Mild-to-moderate calcific plaque within the right and left carotid bulbs, but no associated stenosis. VERTEBRAL ARTERIES: No significant stenosis. No dissection or occlusion. The vertebral arteries are codominant and normal in caliber throughout their respective courses. CTA HEAD: ANTERIOR CEREBRAL ARTERIES: No significant stenosis. No occlusion. 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 MIDDLE CEREBRAL ARTERIES: No significant stenosis. No occlusion. No aneurysm. Mild atheromatous irregularity of the M1 segments, but no flow-limiting stenosis. POSTERIOR CEREBRAL ARTERIES: No significant stenosis. No occlusion. No aneurysm. The right posterior cerebral artery is normal in caliber and unremarkable. Moderate calcific atheromatous disease at the junction of the left P1 and P2 segments, causing moderate-to-severe focal stenosis, as demonstrated on the previous MRI. BASILAR ARTERY: No significant stenosis. No occlusion. Status post coiling of the basilar tip. There is calcific atheromatous disease within the distal basilar artery, causing mild-to-moderate stenosis, less than 50%. No apparent residual or recurrent aneurysm. OTHER: There is moderate calcific plaque within the carotid siphons, with no flow-limiting stenosis. Moderate spray artifact from embolization coils within the tip of the basilar artery largely obscuring the anterior communicating artery. SOFT TISSUES: There appears to be a solid nodule within the right lobe of the thyroid  measuring approximately 11 x 8 mm. According to the American Thyroid  Association guidelines, the finding is consistent with an  incidental thyroid  nodule >1 cm and the recommendation is dedicated thyroid  ultrasound and serum TSH measurement. BONES: There is degenerative endolisthesis present at C3-4. There is also reversal of the normal cervical lordosis and diffuse multilevel chronic degenerative disc disease. LIMITATIONS/ARTIFACTS: There is free artifact obscuring and distorting the lower neck. IMPRESSION: 1. No acute intracranial hemorrhage or ischemic change. 2. Status post coiling of the basilar tip with no apparent residual or recurrent aneurysm. 3. 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 4. Moderate-to-severe focal stenosis at the junction of the  left P1 and P2 segments, as demonstrated on the previous MRI. 5. Incidental solid nodule within the right lobe of the thyroid  measuring approximately 11 x 8 mm. Recommend dedicated thyroid  ultrasound and serum TSH measurement. Electronically signed by: evalene coho 12/02/2023 10:10 AM EDT RP Workstation: [id number]   MR ANGIO HEAD WO CONTRAST Result Date: 12/02/2023 CLINICAL DATA:  83 year old female with superior right frontal lobe hemorrhage on Head CT yesterday. Neurologic deficit. 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 EXAM: MRA HEAD WITHOUT CONTRAST TECHNIQUE: Angiographic images of the Circle of Willis were acquired using MRA technique without intravenous contrast. COMPARISON:  Brain MRI today reported separately. No prior CT or MR angiogram. FINDINGS: Anterior circulation: Antegrade flow signal in both ICA siphons. Ophthalmic and right posterior communicating artery origins appear normal. Left posterior communicating artery diminutive or absent. Mild ICA siphon irregularity without stenosis. Patent carotid termini, MCA and ACA origins. Mildly ectatic anterior communicating artery and difficult to exclude a small 2 mm Acomm aneurysm as seen on series 4, image 63. This could alternatively be infundibulum. Tortuous proximal A2 segments. Visible ACA branches otherwise within normal limits. Left MCA M1 bifurcates early without stenosis. Right MCA M1 and bifurcation appear patent without stenosis. Visible MCA branches are within normal limits. Posterior circulation: Antegrade flow in the posterior circulation. Codominant distal vertebral arteries and vertebrobasilar junction appear patent without stenosis. Patent PICA origins. Patent basilar artery without stenosis. Artifact at the basilar tip from embolization coil pack. Small area of 2 mm flow signal along the inferior surface of the coil pack, series 4, image 64 and series 455, image 9. Adjacent PCA origins remain  patent. Right posterior communicating artery is visible. SCA origins are patent. Right PCA branches are within normal limits. There is a short segment moderate to severe stenosis of the left PCA P1/P2 junction (series 454, image 6). With maintained distal left PCA branch flow. Anatomic variants: None significant. Other: Brain MRI today is reported separately. IMPRESSION: 1. 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 2. Difficult to exclude a small 2 mm A-comm aneurysm, but might an infundibulum. 3. No large vessel occlusion. 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 4. Abnormal Brain MRI today reported separately. Electronically Signed   By: VEAR Hurst M.D.   On: 12/02/2023 09:23   MR BRAIN WO CONTRAST Result Date: 12/02/2023 CLINICAL DATA:  83 year old female with superior right frontal lobe hemorrhage on Head CT yesterday. Neurologic deficit. 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 EXAM: MRI HEAD WITHOUT CONTRAST TECHNIQUE: Multiplanar, multiecho pulse sequences of the brain and surrounding structures were obtained without intravenous contrast. COMPARISON:  Head CT yesterday.  Brain MRI 11/27/2022. FINDINGS: Brain: Significant streak artifact through the anterior cranial fossa on CT yesterday from large basilar  tip aneurysm coil pack. MRI reveals similar appearing right inferior frontal gyrus and superior frontal gyrus intra-axial hemorrhage, with T1 and T2 signal heterogeneity (series 9, image 45 in the right inferior frontal gyrus. That hematoma is 17 x 26 by 21 mm (AP by transverse by CC, volume estimated at 5 mL) where as the left superior frontal gyrus hematoma visible by CT is 38 x 22 x 31 mm (AP by transverse by CC, volume estimated at 13 mL) and extends to the superior surface of the brain. Comparing across modalities, neither hemorrhage appears significantly  changed from the CT yesterday. Mild regional T2 and FLAIR hyperintense edema at both sites. No extra-axial or intraventricular blood is identified. DWI susceptibility at both sites with no other restricted diffusion suggestive of acute ischemia. No significant midline shift or intracranial mass effect, basilar cisterns remain normal. Scattered chronic microhemorrhages in the left parietal and occipital lobes also appear increased from the SWI last year. Curvilinear chronic hemosiderin in the inferior left cerebellum is stable. Elsewhere confluent bilateral cerebral white matter T2 and FLAIR hyperintensity has not significantly changed. Deep gray nuclei T2 heterogeneity appears stable. Negative pituitary and cervicomedullary junction. Vascular: Major intracranial vascular flow voids are preserved. Heterogeneity at the basilar artery tip appears stable from the MRI last year. Skull and upper cervical spine: Stable. Background bone marrow signal is within normal limits. Sinuses/Orbits: Stable, negative. Other: Mastoids are clear. IMPRESSION: 1. MRI reveals two acute/subacute intra-axial hemorrhages in the right hemisphere: Known superior frontal gyrus hematoma (13 mL stable since CT yesterday), and a smaller right inferior frontal gyrus hematoma (5 mL) which was obscured on CT by basilar tip coil pack artifact. Underlying micro-hemorrhages in the left parietal and occipital lobes appear increased from MRI last year. 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 2. Mild associated cerebral edema with no significant intracranial mass effect or other complicating features. 3. No other acute intracranial abnormality. Previously treated basilar tip aneurysm. Advanced chronic white matter disease. Electronically Signed   By: VEAR Hurst M.D.   On: 12/02/2023 09:16   CT Head Wo Contrast Addendum Date: 12/01/2023 ADDENDUM REPORT: 12/01/2023 23:57 ADDENDUM: These results were called by  telephone at the time of interpretation on 12/01/2023 at 11:56 pm to provider April Palumbo, MD, who verbally acknowledged these results. Electronically Signed   By: Dorethia Molt M.D.   On: 12/01/2023 23:57   Result Date: 12/01/2023 CLINICAL DATA:  Mental status change, unknown cause, dementia EXAM: CT HEAD WITHOUT CONTRAST TECHNIQUE: Contiguous axial images were obtained from the base of the skull through the vertex without intravenous contrast. RADIATION DOSE REDUCTION: This exam was performed according to the departmental dose-optimization program which includes automated exposure control, adjustment of the mA and/or kV according to patient size and/or use of iterative reconstruction technique. COMPARISON:  MRI 11/27/2022 FINDINGS: Brain: Acute intraparenchymal hematoma is seen involving the superior frontal gyrus of the right frontal lobe measuring 3.1 x 3.6 x 1.5 cm in greatest dimension (23/6, 22/2). Small amount of surrounding cytotoxic. Mild mass effect with effacement of the overlying sulci. No associated subdural or subarachnoid hemorrhage. No intraventricular hemorrhage. No midline shift. Significant streak artifact related to embolization coils in the region of the basilar tip. Mild parenchymal volume loss is commensurate with the patient's age. Extensive periventricular white matter changes are identified, stable since prior examination, possibly related to small vessel ischemia. Ventricular size is normal. Cerebellum is unremarkable. Vascular: No hyperdense vessel or unexpected calcification. Embolization coils  noted within the basilar tip. Skull: Normal. Negative for fracture or focal lesion. Sinuses/Orbits: No acute finding. Other: Mastoid air cells and middle ear cavities are clear. IMPRESSION: 1. Acute intraparenchymal hematoma involving the superior frontal gyrus of the right frontal lobe measuring 3.1 x 3.6 x 1.5 cm in greatest dimension. Mild mass effect with effacement of the overlying  sulci. No midline shift. 2. Extensive periventricular white matter changes, stable since prior examination, possibly related to small vessel ischemia. 3. Embolization coils in the region of the basilar tip. Electronically Signed: By: Dorethia Molt M.D. On: 12/01/2023 23:50    Labs:  Basic Metabolic Panel: Recent Labs  Lab 12/10/23 1552 12/11/23 0602 12/12/23 0629  NA 139 143 139  K 4.6 3.9 4.7  CL 103 107 112*  CO2 24 25 20*  GLUCOSE 111* 78 81  BUN 28* 27* 25*  CREATININE 1.43* 1.09* 1.02*  CALCIUM  9.6 9.3 9.1    CBC: Recent Labs  Lab 12/10/23 1552  WBC 6.2  NEUTROABS 4.8  HGB 14.3  HCT 45.1  MCV 92.6  PLT 222    CBG: No results for input(s): GLUCAP in the last 168 hours.  Family history.  Mother with Alzheimer's disease father with lung cancer.  Denies any colon cancer esophageal cancer or rectal cancer  Brief HPI:   [name] [name] is a 83 y.o. right-handed female with history significant for dementia maintained on Namenda , hyperlipidemia GERD hypertension CAD fibromyalgia interstitial cystitis basilar tip aneurysm status post embolization 2021, bilateral total knee replacement.  Per chart review patient lives alone.  1 level home.  She does use a rolling walker for balance.  Daughter with good support provides meals.  Independent basic self-care.  Patient does not drive.  Presented 12/01/2023 with progressive decrease in ambulation as well as intermittent bouts of confusion x 1 week.  Blood pressure 159/113.  Daughter reports that over the past week patient have been less talkative and stumbling with ambulation with dragging her left leg.  She denied any recent fall.  Cranial CT scan showed acute intraparenchymal hematoma involving the superior frontal gyrus of the right frontal lobe measuring 3.1 x 3.6 x 1.5 cm in greatest dimension.  Mild mass effect with effacement of the overlying Sulca..  No midline shift.  Embolization coils in the region of the basilar tip.  MRI  revealed a 2 acute/subacute intra-axial hemorrhage in the right hemisphere.  Underlying microhemorrhages in the left parietal occipital lobes.  Somewhat increased from MRI of last year.  Mild associated cerebral edema no significant intracranial mass effect or complicating features.  MRA with no large vessel occlusion.  Admission chemistries unremarkable urinalysis negative nitrite.  EEG negative for seizures.  Echocardiogram with ejection fraction of 65 to 70% grade 1 diastolic dysfunction.  No regional wall motion abnormalities.  Initially placed on Cleviprex  for blood pressure control.  Patient was cleared to begin subcutaneous heparin  for DVT prophylaxis 12/03/2023.  Therapy evaluations completed due to patient decreased functional mobility was admitted for a comprehensive rehab program.   Hospital Course: Anyi B Imparato was admitted to rehab 12/05/2023 for inpatient therapies to consist of PT, ST and OT at least three hours five days a week. Past admission physiatrist, therapy team and rehab RN have worked together to provide customized collaborative inpatient rehab.  Pertaining to patient's intraparenchymal hemorrhage/right frontal lobe/superior frontal gyrus as well as history of basilar tip aneurysm status post embolization 2021.  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  Followed by neurology services.  She was cleared for subcutaneous heparin  for DVT prophylaxis no bleeding episodes.  Mood stabilization with Cymbalta  as well as Namenda  she was attending full therapies.  Blood pressure controlled on Norvasc  as well as Toprol  and monitor with increased mobility and would need outpatient follow-up.  Crestor  for hyperlipidemia.  Patient did develop E. coli UTI 50,000 colony count placed on Keflex  12/11/2023   Blood pressures were monitored on TID basis and controlled and monitored     Rehab course: During patient's stay in rehab weekly team conferences were held to monitor patient's progress, set goals  and discuss barriers to discharge. At admission, patient required minimal assist 75 feet rolling walker moderate assist step pivot transfers  He/She  has had improvement in activity tolerance, balance, postural control as well as ability to compensate for deficits. He/She has had improvement in functional use RUE/LUE  and RLE/LLE as well as improvement in awareness.  Bed mobility minimal assist with assistance with trunk to elevate.  Grooming/oral hygiene contact-guard standing at sink for hand hygiene.  Toilet transfers minimal assist overall bed to standard toilet.  Toileting minimal assist for stabilizing patient.  She was able to pull her pants up with bridging in bed.  Ambulates hand-held assist short distances.  Gait training in parallel bars walking/retrowalking with contact-guard cues SLP follow-up patient benefited from moderate assist to interpret daily schedule to anticipate upcoming therapy sessions.  Patient correctly identified that she had not yet received any therapy prior to SLP.  SLP conducted calendar task with patient required hand overhand assist to number calendar and indicate date via crossing out dates that have passed.  Full family teaching completed plan discharge to home.       Disposition:  Discharge disposition: 06-Home-Health Care Svc        Diet: Regular  Special Instructions: No driving smoking or alcohol  Medications at discharge 1.  Tylenol  as needed 2.  Norvasc  10 mg p.o. daily 3.  Cymbalta  60 mg p.o. twice daily 4.  Magnesium  gluconate 500 mg p.o. daily 5.  Namenda  10 mg p.o. twice daily 6.  Toprol -XL 12.5 mg p.o. daily 7.  Crestor  20 mg p.o. daily 8.  Vitamin D  2000 units daily 9.  Colace 100 mg daily 10.  MiraLAX  daily hold for loose stools   30-35 minutes were spent completing discharge summary and discharge planning  Discharge Instructions     Ambulatory referral to Neurology   Complete by: As directed    An appointment is requested in  approximately: 4 weeks intraparenchymal hemorrhage   Ambulatory referral to Physical Medicine Rehab   Complete by: As directed    Moderate complexity follow-up 1 to 2 weeks intraparenchymal hemorrhage        Follow-up Information     Raulkar, Sven SQUIBB, MD Follow up.   Specialty: Physical Medicine and Rehabilitation Why: Office to call for appointment Contact information: [address] [place] [address] [phone]         Charlett Apolinar POUR, MD Follow up.   Specialties: Internal Medicine, Pediatrics Why: Call for appointment Contact information: [address] [place] [address] [phone]                 Signed: Toribio PARAS Khaliq Turay 12/16/2023, 4:47 AM

## 2023-12-07 NOTE — Discharge Instructions (Addendum)
 Inpatient Rehab Discharge Instructions  [facility] Discharge date and time: No discharge date for patient encounter.   Activities/Precautions/ Functional Status: Activity: As tolerated Diet: Regular Wound Care: Routine skin checks Functional status:  ___ No restrictions     ___ Walk up steps independently ___ 24/7 supervision/assistance   ___ Walk up steps with assistance ___ Intermittent supervision/assistance  ___ Bathe/dress independently ___ Walk with walker     _x__ Bathe/dress with assistance ___ Walk Independently    ___ Shower independently ___ Walk with assistance    ___ Shower with assistance ___ No alcohol     ___ Return to work/school ________  Special Instructions: No driving smoking or alcohol COMMUNITY REFERRALS UPON DISCHARGE:    Home Health:   PT  OT                                 Agency: [facility]    Phone: [phone]        Medical Equipment/Items Ordered: 3-1 COMMODE                                                              Agency/Supplier: [facility]  My questions have been answered and I understand these instructions. I will adhere to these goals and the provided educational materials after my discharge from the hospital.  Patient/Caregiver Signature _______________________________ Date __________  Clinician Signature _______________________________________ Date __________  Please bring this form and your medication list with you to all your follow-up doctor's appointments.

## 2023-12-07 NOTE — Progress Notes (Signed)
 Occupational Therapy Session Note  Patient Details  Name: [name] [name] MRN: [id number] Date of Birth: Oct 12, 1940  Today's Date: 12/07/2023 OT Individual Time: 0850-1000 OT Individual Time Calculation (min): 70 min    Short Term Goals: Week 1:  OT Short Term Goal 1 (Week 1): Patient will complete UB bathing and dressing with  MinA. OT Short Term Goal 2 (Week 1): The pt will complete LB bathing and dressing with MinA  incorporating AE as needed. OT Short Term Goal 3 (Week 1): The pt will improve UB strength to 4/5 MMT OT Short Term Goal 4 (Week 1): The pt will replicate a pattern after verbal instruction and a demonstration 4 of 5 occurrences. OT Short Term Goal 5 (Week 1): The pt will complete sit to stands with MinA incorporating with the RW with good anatomical positioning.  Skilled Therapeutic Interventions/Progress Updates:    Patient resting in bed at the time of arrival.  The pt presented with a resting BP of 132/103 that elevated to 163/102, the pt was provided medication to address the concern.  The pt was able to come from supine in bed  to EOB with ModA, she was able to transfer from EOB to w/c LOF using the RW at [facility] transition to MinA.  The pt was transported to the sink area and was able to doff her hospital gown with MaxA.  The pt was able to bathe her UB with MinA  and constant cues.   The pt was able to bathe the upper portion of her LB with MinA and  heavy cues, she was  ModA with the lower portion of BLE. The pt was MaxA for her feet.  The pt was able to complete peri care for front and back with MinA. The pt was able to apply deo with MinA and cues, she was able to donn her over head hospital top with MinA, she was Dep with her brief and ModA for donning her hospital pants. The pt was MaxA for donning her non -skid socks. The pt was positioned at the sink and was able to brush her teeth with s/uA,she was s/uA for combing her hair as well. The pt was  transported back to  bed LOF   and was able to transfer to  EOB using the bed rail with ModA for placement. The pt was ModA for mananging BLE onto the bed. The pt was MinA for repositioning up in bed.  At the end of the session, the call light and bedside table were placed within reach with all additional needs addressed.  Therapy Documentation Precautions:  Precautions Precautions: Fall Recall of Precautions/Restrictions: Impaired Restrictions Weight Bearing Restrictions Per Provider Order: No  Therapy/Group: Individual Therapy  Elvera JONETTA Mace 12/07/2023, 1:01 PM

## 2023-12-07 NOTE — Progress Notes (Signed)
 PROGRESS NOTE   Subjective/Complaints: BP improved, increase magnesium  to 500mg  since systolic still elevated intermittently Feet feel warmer Denies complaints  ROS: +impaired cognition, +cold feet- improved today   Objective:   No results found. Recent Labs    12/05/23 1734  WBC 4.6  HGB 12.5  HCT 38.5  PLT 164   Recent Labs    12/05/23 1734  CREATININE 1.03*    Intake/Output Summary (Last 24 hours) at 12/07/2023 1458 Last data filed at 12/06/2023 2215 Gross per 24 hour  Intake 240 ml  Output --  Net 240 ml        Physical Exam: Vital Signs Blood pressure 117/66, pulse 70, temperature 98.9 F (37.2 C), temperature source Oral, resp. rate 16, height 5' 3 (1.6 m), weight 56.9 kg, SpO2 100%. Gen: no distress, normal appearing HEENT: oral mucosa pink and moist, NCAT Cardio: Reg rate Chest: normal effort, normal rate of breathing Abd: soft, non-distended Ext: no edema, feet appear well perfused today Psych: pleasant, normal affect Skin: intact Musculoskeletal:        General: No swelling or tenderness. Normal range of motion.     Cervical back: Normal range of motion.  Skin:    General: Skin is warm and dry.     Comments: Bilateral TKA scars   PRIOR EXAMS: Neurological:     Mental Status: She is alert.     Comments: Pt is alert, oriented to person, hospital, date with extra time. Word finding deficits at times. With extra time usually able to convey thoughts. STM deficits. Fair awareness and insight.  CN exam non-focal. Speech generally clear. MMT: RUE 4+ to 5/5 prox to distal. LLE 4 to 4+/5 prox to distal. RLE 4/5 HF 4+ KE,  5/5 ADF/PF. LLE 3- to 3/5 HF, 3/5 KE and 3-4/5 ADF/PF. Sensory exam normal for light touch and pain in all 4 limbs. No limb ataxia or cerebellar signs. No abnormal tone appreciated.  DTR's 1+.   Assessment/Plan: 1. Functional deficits which require 3+ hours per day of  interdisciplinary therapy in a comprehensive inpatient rehab setting. Physiatrist is providing close team supervision and 24 hour management of active medical problems listed below. Physiatrist and rehab team continue to assess barriers to discharge/monitor patient progress toward functional and medical goals  Care Tool:  Bathing    Body parts bathed by patient: Right arm, Left arm, Abdomen, Right upper leg, Left upper leg, Face, Chest   Body parts bathed by helper: Buttocks, Front perineal area, Right lower leg, Left lower leg, Left upper leg, Right upper leg     Bathing assist Assist Level: Moderate Assistance - Patient 50 - 74%     Upper Body Dressing/Undressing Upper body dressing        Upper body assist Assist Level: Moderate Assistance - Patient 50 - 74%    Lower Body Dressing/Undressing Lower body dressing      What is the patient wearing?: Incontinence brief     Lower body assist Assist for lower body dressing: Maximal Assistance - Patient 25 - 49%     Toileting Toileting    Toileting assist Assist for toileting: Dependent - Patient 0%  Transfers Chair/bed transfer  Transfers assist     Chair/bed transfer assist level: Moderate Assistance - Patient 50 - 74%     Locomotion Ambulation   Ambulation assist   Ambulation activity did not occur: Safety/medical concerns  Assist level: Minimal Assistance - Patient > 75% Assistive device: Walker-rolling Max distance: 73   Walk 10 feet activity   Assist  Walk 10 feet activity did not occur: Safety/medical concerns  Assist level: Minimal Assistance - Patient > 75% Assistive device: Walker-rolling   Walk 50 feet activity   Assist Walk 50 feet with 2 turns activity did not occur: Safety/medical concerns  Assist level: Minimal Assistance - Patient > 75% Assistive device: Walker-rolling    Walk 150 feet activity   Assist Walk 150 feet activity did not occur: Safety/medical concerns          Walk 10 feet on uneven surface  activity   Assist Walk 10 feet on uneven surfaces activity did not occur: Safety/medical concerns         Wheelchair     Assist Is the patient using a wheelchair?: No Type of Wheelchair: Manual    Wheelchair assist level: Dependent - Patient 0% Max wheelchair distance: 150    Wheelchair 50 feet with 2 turns activity    Assist        Assist Level: Dependent - Patient 0%   Wheelchair 150 feet activity     Assist      Assist Level: Dependent - Patient 0%   Blood pressure 117/66, pulse 70, temperature 98.9 F (37.2 C), temperature source Oral, resp. rate 16, height 5' 3 (1.6 m), weight 56.9 kg, SpO2 100%.  Medical Problem List and Plan: 1. 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  Antithrombotics: -DVT/anticoagulation:  Pharmaceutical: continue Heparin , SCDs ordered             -antiplatelet therapy: N/A  3. Pain Management: Tylenol  as needed  4. Mood/Behavior/Sleep: continue Cymbalta  60 mg daily, Namenda  10 mg twice daily             -antipsychotic agents: N/A  5. Neuropsych/cognition: This patient is not capable of making decisions on her own behalf.  6. Constipation: last BM 8/16, decrease regimen to senna 2 tabs HS, colace 1 tab daily, mag gluconate 500mg  daily added  7. Fluids/Electrolytes/Nutrition: Routine in and outs with follow-up chemistries on Monday  8.  Hypertension.  Norvasc  10 mg daily, Toprol -XL 12.5 mg daily.  Monitor with increased mobility, magnesium  supplement started- increase to 500mg             Improved with discontinuation of teds  9.   Hyperlipidemia continue Crestor   10. Likely peripheral arterial disease: would benefit from outpatient vascular follow-up, may benefit from outpatient Qutenza, d/c teds as worsens symptoms of cold feet- improved 8/17  LOS: 2 days A FACE TO FACE EVALUATION WAS PERFORMED  Sven P Almeter Westhoff 12/07/2023, 2:58 PM

## 2023-12-07 NOTE — Progress Notes (Signed)
 Daughter of patient called asking for an update on her mother. Returned phone call to Daughter with number in the chart. Explained in voicemail that night shift will be taking over and if any questions can call the unit number this evening.

## 2023-12-07 NOTE — Plan of Care (Signed)
  Problem: RH BOWEL ELIMINATION Goal: RH STG MANAGE BOWEL WITH ASSISTANCE Description: STG Manage Bowel with toileting Assistance. Outcome: Progressing Flowsheets (Taken 12/07/2023 1638) STG: Pt will manage bowels with assistance: 3-Moderate assistance Goal: RH STG MANAGE BOWEL W/MEDICATION W/ASSISTANCE Description: STG Manage Bowel with Medication with mod I  Assistance. Outcome: Progressing Flowsheets (Taken 12/07/2023 1638) STG: Pt will manage bowels with medication with assistance: 3-Moderate assistance   Problem: RH BLADDER ELIMINATION Goal: RH STG MANAGE BLADDER WITH ASSISTANCE Description: STG Manage Bladder With toileting Assistance Outcome: Progressing   Problem: RH SAFETY Goal: RH STG ADHERE TO SAFETY PRECAUTIONS W/ASSISTANCE/DEVICE Description: STG Adhere to Safety Precautions With cues Assistance/Device. Outcome: Progressing   Problem: RH PAIN MANAGEMENT Goal: RH STG PAIN MANAGED AT OR BELOW PT'S PAIN GOAL Description: Pain < 4 with prns Outcome: Progressing   Problem: RH KNOWLEDGE DEFICIT Goal: RH STG INCREASE KNOWLEDGE OF HYPERTENSION Description: Patient and dtr will be able to manage HTN using educational resources for medications and dietary modification independently Outcome: Progressing

## 2023-12-07 NOTE — Progress Notes (Signed)
 Physical Therapy Session Note  Patient Details  Name: [name] [name] MRN: [id number] Date of Birth: [birth year]  Today's Date: 12/07/2023 PT Individual Time: 8584-8471 PT Individual Time Calculation (min): 73 min   Short Term Goals: Week 1:  PT Short Term Goal 1 (Week 1): Pt will increase bed mobility to min A. PT Short Term Goal 2 (Week 1): Pt will increase transfers to min A. PT Short Term Goal 3 (Week 1): Pt will ambulate 50 feet with LRAD and min A PT Short Term Goal 4 (Week 1): Pt will ascend/descend 4 stairs with B rails and min A PT Short Term Goal 5 (Week 1): Pt will increase standing balance to min A  Skilled Therapeutic Interventions/Progress Updates:      Pt seated in WC upon arrival. Pt agreeable to therapy. Pt denies any pain.   Vitals assessed:  Seated in WC BP 95/61 HR 72 Post ambulation to and from bathroom, and washing hands at sink: BP 126/80 HR 82   Pt performed sit to stand from [facility] and toilet with Rw and mod A, verbal and tactile cues provided for UE positoining and anterior weight shift.   Pt ambulated chair<>toilet<>sink with RW and min A with shuffle pattern, verbal and tactile cues provided for big steps, max verbal cues provided for safety with RW and seqencing ambulatory transfer. Pt incontinetn of bowel and bladdder. Pt performed pericare with total A while standing with RW and CGA.   Pt stood at sink and washed hands with CGA.   Pt performed sit to stand, stand pivot transfer  thorughout session with RW and mod progressing to min A, max verbal cues provided for anterior weight shift 2/2 posterior bias.   Pt ambulated 35 feet with RW and min A, pt ambulated 2x30 feet with HHA and min A, verbal and tactile cues provided for anterior weight shift, upright posture, and reciprocal gait.   Pt seated in WC at end of session with all needs within reach and seatbelt alarm on.       Therapy Documentation Precautions:  Precautions Precautions: Fall Recall of  Precautions/Restrictions: Impaired Restrictions Weight Bearing Restrictions Per Provider Order: No  Therapy/Group: Individual Therapy  [facility] Doreene Orris, [place], DPT  12/07/2023, 2:44 PM

## 2023-12-08 ENCOUNTER — Other Ambulatory Visit: Payer: Self-pay

## 2023-12-08 DIAGNOSIS — I619 Nontraumatic intracerebral hemorrhage, unspecified: Secondary | ICD-10-CM | POA: Diagnosis not present

## 2023-12-08 DIAGNOSIS — K5901 Slow transit constipation: Secondary | ICD-10-CM | POA: Diagnosis not present

## 2023-12-08 DIAGNOSIS — I739 Peripheral vascular disease, unspecified: Secondary | ICD-10-CM | POA: Diagnosis not present

## 2023-12-08 DIAGNOSIS — I10 Essential (primary) hypertension: Secondary | ICD-10-CM | POA: Diagnosis not present

## 2023-12-08 LAB — CBC WITH DIFFERENTIAL/PLATELET
Abs Immature Granulocytes: 0.02 K/uL (ref 0.00–0.07)
Basophils Absolute: 0 K/uL (ref 0.0–0.1)
Basophils Relative: 1 %
Eosinophils Absolute: 0.1 K/uL (ref 0.0–0.5)
Eosinophils Relative: 2 %
HCT: 40 % (ref 36.0–46.0)
Hemoglobin: 13 g/dL (ref 12.0–15.0)
Immature Granulocytes: 0 %
Lymphocytes Relative: 24 %
Lymphs Abs: 1.5 K/uL (ref 0.7–4.0)
MCH: 29.7 pg (ref 26.0–34.0)
MCHC: 32.5 g/dL (ref 30.0–36.0)
MCV: 91.3 fL (ref 80.0–100.0)
Monocytes Absolute: 0.4 K/uL (ref 0.1–1.0)
Monocytes Relative: 7 %
Neutro Abs: 4.1 K/uL (ref 1.7–7.7)
Neutrophils Relative %: 66 %
Platelets: 186 K/uL (ref 150–400)
RBC: 4.38 MIL/uL (ref 3.87–5.11)
RDW: 13.2 % (ref 11.5–15.5)
WBC: 6.2 K/uL (ref 4.0–10.5)
nRBC: 0 % (ref 0.0–0.2)

## 2023-12-08 LAB — COMPREHENSIVE METABOLIC PANEL WITH GFR
ALT: 12 U/L (ref 0–44)
AST: 18 U/L (ref 15–41)
Albumin: 2.9 g/dL — ABNORMAL LOW (ref 3.5–5.0)
Alkaline Phosphatase: 41 U/L (ref 38–126)
Anion gap: 4 — ABNORMAL LOW (ref 5–15)
BUN: 21 mg/dL (ref 8–23)
CO2: 29 mmol/L (ref 22–32)
Calcium: 9.4 mg/dL (ref 8.9–10.3)
Chloride: 107 mmol/L (ref 98–111)
Creatinine, Ser: 1.02 mg/dL — ABNORMAL HIGH (ref 0.44–1.00)
GFR, Estimated: 55 mL/min — ABNORMAL LOW (ref >60)
Glucose, Bld: 90 mg/dL (ref 70–99)
Potassium: 4.1 mmol/L (ref 3.5–5.1)
Sodium: 140 mmol/L (ref 135–145)
Total Bilirubin: 0.8 mg/dL (ref 0.0–1.2)
Total Protein: 5.9 g/dL — ABNORMAL LOW (ref 6.5–8.1)

## 2023-12-08 NOTE — Progress Notes (Signed)
 Speech Language Pathology Daily Session Note  Patient Details  Name: [name] [name] MRN: [id number] Date of Birth: April 12, 1941  Today's Date: 12/08/2023 SLP Individual Time: 0733-0828 SLP Individual Time Calculation (min): 55 min  Short Term Goals: Week 1: SLP Short Term Goal 1 (Week 1): Patient will utilize external memory aids to recall daily information with 80% accuracy given mod assist. SLP Short Term Goal 2 (Week 1): Patient will complete basic functional problem solving tasks with 80% accuracy given mod multimodal cues. SLP Short Term Goal 3 (Week 1): Patient will maintain topic during conversation re: biographical information with mod cues from conversation partner. SLP Short Term Goal 4 (Week 1): Patient will orient to year and month using external aids in 4/5 opportunities given mod assist.  Skilled Therapeutic Interventions: SLP conducted skilled therapy session targeting cognitive goals. Upon entry, patient's brief soiled in bed. Patient indicating no awareness of soiled brief, NT assisted with brief change at bed level. Throughout, patient followed one step directions with one to one tactile cues. SLP then provided setupA for consumption of breakfast items. During meal, patient benefited from minimization of external distracters and min cues for sustained attention, supervision cues for problem solving. SLP attempted intermittent conversation during meal but patient was unable to attend to food items during these attempts, thus continued with quiet, minimally distracting environment. With these environmental modifications, patient sustained attention to meal for 45 minutes. In final minutes of session, patient benefited from total assist to interpret remaining daily schedule from provided visual aid and max assist to utilize same aid to locate today's date. Patient was left in room with call bell in reach and alarm set. SLP will continue to target goals per plan of care.        Pain   None  Therapy/Group: Individual Therapy  Rosina Downy, M.A., CCC-SLP  Beda Dula A Yoshi Vicencio 12/08/2023, 8:52 AM

## 2023-12-08 NOTE — Progress Notes (Signed)
 Physical Therapy Session Note  Patient Details  Name: [name] [name] MRN: [id number] Date of Birth: Apr 26, 1940  Today's Date: 12/08/2023 PT Individual Time: 1401-1515 PT Individual Time Calculation (min): 74 min   Short Term Goals: Week 1:  PT Short Term Goal 1 (Week 1): Pt will increase bed mobility to min A. PT Short Term Goal 2 (Week 1): Pt will increase transfers to min A. PT Short Term Goal 3 (Week 1): Pt will ambulate 50 feet with LRAD and min A PT Short Term Goal 4 (Week 1): Pt will ascend/descend 4 stairs with B rails and min A PT Short Term Goal 5 (Week 1): Pt will increase standing balance to min A  Skilled Therapeutic Interventions/Progress Updates:       Pt seated in w/c with family (daughter and grandson) present at the bedside. CSW also present. Patient in agreement to therapy treatment. Family asking for therapy to be blocked off from 8-9 AM due to her dementia - relayed request to scheduler.   Patient transported from her room to the main gym via wheelchair for energy conservation.   Worked on sit<>stands from w/c to RW - allowed time for initiation and processing - she tends to wait for assistance to be offered prior to trying to initiate. With time, for processing and initiation, she was able to stand 1x5 times with CGA with cues for hand placement and full upright.   Pt instructed in gait training, ambulating 22ft + 131ft + 30ft with CGA and RW with safety cues for awareness and verbal cueing for increasing stride length and proximity to RW as she keeps her body outside walker frame. Pt has a step-to pattern, worked on progressing step length bilaterally to improve gait quality and speed. Pt has difficulty with dual-cog tasks, even with simple conversation while ambulating.  Pt instructed in stair training, using 6 steps and 2 hand rails. She was apprehensive for try these due to expected difficulty and her fear of falling. She completed these with minA for navigating x4  steps, needing mod cues for keeping her hands in front of her to help reduce her posterior bias in standing.   Timed Up and Go completed while using RW: -2 minutes and 45 seconds *scores > 13.5 seconds indicate increased falls risk  Pt finished session on seated stepper (Kinetron) at w/c level for x6 minutes at L50cm/sec resistance, cues for full AROM bilaterally and keeping sustained cadence to challenge strength and endurance.   Pt returned to her room at completion of session. Pt left sitting up with her seat belt alarm on, family members present. Updated on patient's therapy session and progressions made.    Therapy Documentation Precautions:  Precautions Precautions: Fall Recall of Precautions/Restrictions: Impaired Restrictions Weight Bearing Restrictions Per Provider Order: No General:   Therapy/Group: Individual Therapy  Sherlean SHAUNNA Perks 12/08/2023, 7:51 AM

## 2023-12-08 NOTE — Progress Notes (Addendum)
 Physical Therapy Session Note  Patient Details  Name: [name] [name] MRN: [id number] Date of Birth: 02/19/1941  Today's Date: 12/08/2023 PT Individual Time: 1000-1045 PT Individual Time Calculation (min): 45 min   Short Term Goals: Week 1:  PT Short Term Goal 1 (Week 1): Pt will increase bed mobility to min A. PT Short Term Goal 2 (Week 1): Pt will increase transfers to min A. PT Short Term Goal 3 (Week 1): Pt will ambulate 50 feet with LRAD and min A PT Short Term Goal 4 (Week 1): Pt will ascend/descend 4 stairs with B rails and min A PT Short Term Goal 5 (Week 1): Pt will increase standing balance to min A  Skilled Therapeutic Interventions/Progress Updates: Pt presents supine in bed and agreeable to therapy.  PT threaded pants over feet and then pt pulled up and bridged to pull over hips although still requires completion from PT when stands.  Pt transferred sup to sit w/ min A although increased time and bed features.  Pt scoots to EOB w/ cues.  Shoes donned total A.  Pt transfers sit to stand w/ mod A and cues for hand placement/sequencing.  Pt performs step-pivot bed > w/c w/ mod A w/ retropulsion, verbal cues for sequencing and foot clearance.  Pt utilized mirror to [employer].  Pt wheeled to main gym for time conservation.  Pt transfers w/ mod A from w/c, even w/ cues for forward scoot unable to scoot.  Pt amb x 48' w/ turn to return to w/c and then 41' w/turn to return.  Pt requires frequent cues for posture, foot clearance and step length, and manual A for walker management w/ turns.  Pt requires seated rest breaks between trials for fatigue.  Pt moves very slowly.  Pt returned to room and remained sitting in w/c w/ chair alarm on and all needs in reach.  BP supine: 132/76, HR 66  BP sitting:  131/54, HR 69     Therapy Documentation Precautions:  Precautions Precautions: Fall Recall of Precautions/Restrictions: Impaired Restrictions Weight Bearing Restrictions Per Provider Order:  No General:   Vital Signs:   Pain: Pain Assessment Pain Scale: 0-10 Pain Score: 0-No pain   Therapy/Group: Individual Therapy  Aftyn Nott P Gryphon Vanderveen 12/08/2023, 11:30 AM

## 2023-12-08 NOTE — Progress Notes (Signed)
 Patient ID: [name] [name], female   DOB: 06-28-1940, 83 y.o.   MRN: [id number] Met with the patient to review current medical situation, rehab process, team conference and plan of care. Response improved from previous note although delayed responses to questions noted and unable to name month/yr without cues.   Briefly reviewed secondary risk management including previous CVA (unaware), HTN, and HLD (LDL 108/Trig 71) with HH diet and medications. Able to note she did not take many medications PTA.Continue to follow along to address educational needs to facilitate preparation for discharge. [name] [name]

## 2023-12-08 NOTE — Plan of Care (Signed)
  Problem: RH BOWEL ELIMINATION Goal: RH STG MANAGE BOWEL WITH ASSISTANCE Description: STG Manage Bowel with toileting Assistance. Outcome: Progressing Goal: RH STG MANAGE BOWEL W/MEDICATION W/ASSISTANCE Description: STG Manage Bowel with Medication with mod I Assistance. Outcome: Progressing   Problem: RH BLADDER ELIMINATION Goal: RH STG MANAGE BLADDER WITH ASSISTANCE Description: STG Manage Bladder With toileting Assistance Outcome: Progressing   Problem: RH SAFETY Goal: RH STG ADHERE TO SAFETY PRECAUTIONS W/ASSISTANCE/DEVICE Description: STG Adhere to Safety Precautions With cues Assistance/Device. Outcome: Progressing

## 2023-12-08 NOTE — Plan of Care (Signed)
  Problem: Consults Goal: RH STROKE PATIENT EDUCATION Description: See Patient Education module for education specifics  Outcome: Progressing   Problem: RH BOWEL ELIMINATION Goal: RH STG MANAGE BOWEL WITH ASSISTANCE Description: STG Manage Bowel with toileting Assistance. Outcome: Progressing Goal: RH STG MANAGE BOWEL W/MEDICATION W/ASSISTANCE Description: STG Manage Bowel with Medication with mod I  Assistance. Outcome: Progressing   Problem: RH BLADDER ELIMINATION Goal: RH STG MANAGE BLADDER WITH ASSISTANCE Description: STG Manage Bladder With toileting Assistance Outcome: Progressing   Problem: RH SAFETY Goal: RH STG ADHERE TO SAFETY PRECAUTIONS W/ASSISTANCE/DEVICE Description: STG Adhere to Safety Precautions With cues Assistance/Device. Outcome: Progressing   Problem: RH PAIN MANAGEMENT Goal: RH STG PAIN MANAGED AT OR BELOW PT'S PAIN GOAL Description: Pain < 4 with prns Outcome: Progressing   Problem: RH KNOWLEDGE DEFICIT Goal: RH STG INCREASE KNOWLEDGE OF HYPERTENSION Description: Patient and dtr will be able to manage HTN using educational resources for medications and dietary modification independently Outcome: Progressing Goal: RH STG INCREASE KNOWLEDGE OF DYSPHAGIA/FLUID INTAKE Description: Patient and dtr will be able to manage dysphagia using educational resources for medications and dietary modification independently Outcome: Progressing Goal: RH STG INCREASE KNOWLEGDE OF HYPERLIPIDEMIA Description: Patient and dtr will be able to manage HLD using educational resources for medications and dietary modification independently Outcome: Progressing Goal: RH STG INCREASE KNOWLEDGE OF STROKE PROPHYLAXIS Description: Patient and dtr will be able to manage secondary risks using educational resources for medications and dietary modification independently Outcome: Progressing

## 2023-12-08 NOTE — Progress Notes (Addendum)
 PROGRESS NOTE   Subjective/Complaints: Pt without specific complaints. Hands still feel cool! Was able to sleep and denies pain  ROS: Patient denies fever, rash, sore throat, blurred vision, dizziness, nausea, vomiting, diarrhea, cough, shortness of breath or chest pain, joint or back/neck pain, headache, or mood change.    Objective:   No results found. Recent Labs    12/05/23 1734 12/08/23 0529  WBC 4.6 6.2  HGB 12.5 13.0  HCT 38.5 40.0  PLT 164 186   Recent Labs    12/05/23 1734 12/08/23 0529  NA  --  140  K  --  4.1  CL  --  107  CO2  --  29  GLUCOSE  --  90  BUN  --  21  CREATININE 1.03* 1.02*  CALCIUM   --  9.4    Intake/Output Summary (Last 24 hours) at 12/08/2023 1503 Last data filed at 12/07/2023 2145 Gross per 24 hour  Intake 238 ml  Output --  Net 238 ml        Physical Exam: Vital Signs Blood pressure 139/63, pulse 63, temperature 97.8 F (36.6 C), temperature source Oral, resp. rate 19, height 5' 3 (1.6 m), weight 56.9 kg, SpO2 100%. Constitutional: No distress . Vital signs reviewed. HEENT: NCAT, EOMI, oral membranes moist Neck: supple Cardiovascular: RRR without murmur. No JVD    Respiratory/Chest: CTA Bilaterally without wheezes or rales. Normal effort    GI/Abdomen: BS +, non-tender, non-distended Ext: no clubbing, cyanosis, hands well perfused but cool. Psych: pleasant and cooperative  Skin: intact Musculoskeletal:        General: No swelling or tenderness. Normal range of motion.     Cervical back: Normal range of motion.  Skin:    General: Skin is warm and dry.     Comments: Bilateral TKA scars   PRIOR EXAMS: Neurological:     Mental Status: She is alert.     Comments: Pt is alert, oriented to person, hospital, date with extra time. Word finding deficits still. Processing can be delayed. Fair awareness and insight.  CN exam non-focal. Speech generally clear. MMT: RUE 4+ to  5/5 prox to distal. LLE 4 to 4+/5 prox to distal. RLE 4/5 HF 4+ KE,  5/5 ADF/PF. LLE 3- to 3/5 HF, 3/5 KE and 3-4/5 ADF/PF.--no change 8/18.  Sensory exam normal for light touch and pain in all 4 limbs. No limb ataxia or cerebellar signs. No abnormal tone appreciated.  DTR's 1+.   Assessment/Plan: 1. Functional deficits which require 3+ hours per day of interdisciplinary therapy in a comprehensive inpatient rehab setting. Physiatrist is providing close team supervision and 24 hour management of active medical problems listed below. Physiatrist and rehab team continue to assess barriers to discharge/monitor patient progress toward functional and medical goals  Care Tool:  Bathing    Body parts bathed by patient: Right arm, Left arm, Abdomen, Right upper leg, Left upper leg, Face, Chest, Buttocks, Front perineal area   Body parts bathed by helper: Buttocks, Front perineal area, Right lower leg, Left lower leg, Left upper leg, Right upper leg Body parts n/a: Right lower leg, Left lower leg   Bathing assist Assist Level:  Minimal Assistance - Patient > 75%     Upper Body Dressing/Undressing Upper body dressing   What is the patient wearing?: Pull over shirt    Upper body assist Assist Level: Minimal Assistance - Patient > 75%    Lower Body Dressing/Undressing Lower body dressing      What is the patient wearing?: Incontinence brief, Pants     Lower body assist Assist for lower body dressing: Moderate Assistance - Patient 50 - 74%     Toileting Toileting    Toileting assist Assist for toileting: Dependent - Patient 0%     Transfers Chair/bed transfer  Transfers assist     Chair/bed transfer assist level: Minimal Assistance - Patient > 75%     Locomotion Ambulation   Ambulation assist   Ambulation activity did not occur: Safety/medical concerns  Assist level: Minimal Assistance - Patient > 75% Assistive device: Walker-rolling Max distance: 125'   Walk 10 feet  activity   Assist  Walk 10 feet activity did not occur: Safety/medical concerns  Assist level: Minimal Assistance - Patient > 75% Assistive device: Walker-rolling   Walk 50 feet activity   Assist Walk 50 feet with 2 turns activity did not occur: Safety/medical concerns  Assist level: Minimal Assistance - Patient > 75% Assistive device: Walker-rolling    Walk 150 feet activity   Assist Walk 150 feet activity did not occur: Safety/medical concerns         Walk 10 feet on uneven surface  activity   Assist Walk 10 feet on uneven surfaces activity did not occur: Safety/medical concerns         Wheelchair     Assist Is the patient using a wheelchair?: No Type of Wheelchair: Manual    Wheelchair assist level: Dependent - Patient 0% Max wheelchair distance: 150    Wheelchair 50 feet with 2 turns activity    Assist        Assist Level: Dependent - Patient 0%   Wheelchair 150 feet activity     Assist      Assist Level: Dependent - Patient 0%   Blood pressure 139/63, pulse 63, temperature 97.8 F (36.6 C), temperature source Oral, resp. rate 19, height 5' 3 (1.6 m), weight 56.9 kg, SpO2 100%.  Medical Problem List and Plan: 1. 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  Antithrombotics: -DVT/anticoagulation:  Pharmaceutical: continue Heparin , SCDs ordered             -antiplatelet therapy: N/A  3. Pain Management: Tylenol  as needed  4. Mood/Behavior/Sleep: continue Cymbalta  60 mg daily, Namenda  10 mg twice daily             -antipsychotic agents: N/A  5. Neuropsych/cognition:  This patient is not capable of making decisions on her own behalf.  6. Constipation: last BM 8/16, decrease regimen to senna 2 tabs HS, colace 1 tab daily, mag gluconate 500mg  daily added  8/18 had BM today 7. Fluids/Electrolytes/Nutrition: Routine in and outs with follow-up chemistries on Monday  8.  Hypertension.  Norvasc  10 mg daily, Toprol -XL  12.5 mg daily.  Monitor with increased mobility, magnesium  supplement started- increase to 500mg             8/18 bp controlled on above regimen  9.  Hyperlipidemia continue Crestor   10. Likely peripheral arterial disease: would benefit from outpatient vascular follow-up, may benefit from outpatient Qutenza, d/c teds as worsens symptoms of cold feet- improved 8/17   -8/18 stable   -check ABI's if concerned acutely  LOS: 3 days A FACE TO FACE EVALUATION WAS PERFORMED  Arthea ONEIDA Gunther 12/08/2023, 3:03 PM

## 2023-12-08 NOTE — IPOC Note (Signed)
 Overall Plan of Care [facility]) Patient Details Name: [name] [name] MRN: [id number] DOB: December 25, 1940  Admitting Diagnosis: Intraparenchymal hemorrhage of brain [facility])  Hospital Problems: Principal Problem:   Intraparenchymal hemorrhage of brain [facility])     Functional Problem List: Nursing Bladder, Bowel, Safety, Endurance, Medication Management, Pain  PT Balance, Endurance, Safety, Motor  OT Cognition, Safety  SLP Cognition, Safety  TR         Basic ADL's: OT Bathing, Dressing, Toileting     Advanced  ADL's: OT       Transfers: PT Bed Mobility, Bed to Chair, Car  OT Tub/Shower, Toilet     Locomotion: PT Ambulation, Stairs     Additional Impairments: OT Fuctional Use of Upper Extremity  SLP Social Cognition   Problem Solving, Memory, Attention, Awareness  TR      Anticipated Outcomes Item Anticipated Outcome  Self Feeding S/U A  Swallowing      Basic self-care  MinA  Toileting  MinA   Bathroom Transfers MinA  Bowel/Bladder  manage bowel w mod I and bladder w toileting  Transfers  S transfers  Locomotion  S gait, contact gaurd stairs  Communication     Cognition  minA  Pain  Pain < 4 with prns  Safety/Judgment  manage safety w cues   Therapy Plan: PT Intensity: Minimum of 1-2 x/day ,45 to 90 minutes PT Frequency: 5 out of 7 days PT Duration Estimated Length of Stay: 12 to 14 days OT Intensity: Minimum of 1-2 x/day, 45 to 90 minutes OT Frequency: Total of 15 hours over 7 days of combined therapies OT Duration/Estimated Length of Stay: 12-14 days SLP Intensity: Minumum of 1-2 x/day, 30 to 90 minutes SLP Frequency: 3 to 5 out of 7 days SLP Duration/Estimated Length of Stay: 12-16 days   Team Interventions: Nursing Interventions Patient/Family Education, Bladder Management, Bowel Management, Disease Management/Prevention, Pain Management, Discharge Planning, Dysphagia/Aspiration Precaution Training, Medication Management  PT interventions  Ambulation/gait training, Warden/ranger, Community reintegration, Discharge planning, DME/adaptive equipment instruction, Functional mobility training, Neuromuscular re-education, Patient/family education, Stair training, Therapeutic Activities, Therapeutic Exercise, UE/LE Coordination activities, UE/LE Strength taining/ROM  OT Interventions Warden/ranger, Patient/family education, Therapeutic Exercise, UE/LE Coordination activities, Neuromuscular re-education, UE/LE Strength taining/ROM, Therapeutic Activities, DME/adaptive equipment instruction, Cognitive remediation/compensation  SLP Interventions Cognitive remediation/compensation, Cueing hierarchy, Environmental controls, Therapeutic Activities, Functional tasks, Patient/family education  TR Interventions    SW/CM Interventions Discharge Planning, Psychosocial Support, Patient/Family Education   Barriers to Discharge MD  Medical stability  Nursing Decreased caregiver support, Home environment access/layout home at baseline with assist for cooking/housework, but mod I with mobility/ADLS, daughter comes daily after work to spend time with her, cook dinner, etc.  PT Decreased caregiver support, Inaccessible home environment    OT Decreased caregiver support family provided intermitten support prior to admit, pt presents with significant cognitive deficit  SLP      SW       Team Discharge Planning: Destination: PT-Home ,OT- Home , SLP-Home Projected Follow-up: PT-Home health PT, OT-  Home health OT, SLP-Home Health SLP, 24 hour supervision/assistance, Outpatient SLP Projected Equipment Needs: PT-To be determined, OT- 3 in 1 bedside comode, SLP-None recommended by SLP Equipment Details: PT- , OT-for night use and for showering Patient/family involved in discharge planning: PT- Patient,  OT-Patient, Family Adult nurse (daughter), SLP-Patient  MD ELOS: 12-14 days Medical Rehab Prognosis:   Excellent Assessment: The patient has been admitted for CIR therapies with the diagnosis of IPH with left hemiparesis. The team will  be addressing functional mobility, strength, stamina, balance, safety, adaptive techniques and equipment, self-care, bowel and bladder mgt, patient and caregiver education, NMR, cognition, communication, community reentry. Goals have been set at  min assist with self-care, supervision for mobility, and  min for cognition. Anticipated discharge destination is home with family.        See Team Conference Notes for weekly updates to the plan of care

## 2023-12-08 NOTE — Progress Notes (Signed)
 Occupational Therapy Session Note  Patient Details  Name: [name] [name] MRN: [id number] Date of Birth: 1940-11-14  Today's Date: 12/08/2023 OT Individual Time: 1103-1208 OT Individual Time Calculation (min): 65 min    Short Term Goals: Week 1:  OT Short Term Goal 1 (Week 1): Patient will complete UB bathing and dressing with  MinA. OT Short Term Goal 2 (Week 1): The pt will complete LB bathing and dressing with MinA  incorporating AE as needed. OT Short Term Goal 3 (Week 1): The pt will improve UB strength to 4/5 MMT OT Short Term Goal 4 (Week 1): The pt will replicate a pattern after verbal instruction and a demonstration 4 of 5 occurrences. OT Short Term Goal 5 (Week 1): The pt will complete sit to stands with MinA incorporating with the RW with good anatomical positioning.  Skilled Therapeutic Interventions/Progress Updates:   Patient received seated in wheelchair awake and alert.  Patient agreeable to shower.  Patient transported to bathroom, and stood to complete a stand step transfer.  Patient with significant posterior bias initially in standing.  With light facilitation, patient able to correct her balance with forward weight shift and stand with contact guard.  Patient needs cueing for each step of shower task initially. She then had some automatic functional responses.  Patient able to stand safely in shower with use of grab bar.  Patient with perseverative behavior, but easily redirected/ guided through familiar tasks.  Patient dressed while seated , and able to stand to pull up pants.  Patient responds well to cue to bring belly forward to facilitate hip extension for improved standing balance.   Patient left up in wheelchair with safety belt in place and engaged and call bell in reach.    Therapy Documentation Precautions:  Precautions Precautions: Fall Recall of Precautions/Restrictions: Impaired Restrictions Weight Bearing Restrictions Per Provider Order: No   Pain: Pain  Assessment Pain Scale: 0-10 Pain Score: 0-No pain     Therapy/Group: Individual Therapy  Delphin Funes M 12/08/2023, 12:27 PM

## 2023-12-08 NOTE — Progress Notes (Signed)
 Inpatient Rehabilitation  Patient information reviewed and entered into eRehab system by [facility]. Karen Kays., CCC/SLP, PPS Coordinator.  Information including medical coding, functional ability and quality indicators will be reviewed and updated through discharge.

## 2023-12-09 DIAGNOSIS — I619 Nontraumatic intracerebral hemorrhage, unspecified: Secondary | ICD-10-CM | POA: Diagnosis not present

## 2023-12-09 MED ORDER — VITAMIN D 25 MCG (1000 UNIT) PO TABS
2000.0000 [IU] | ORAL_TABLET | Freq: Every day | ORAL | Status: DC
  Administered 2023-12-10 – 2023-12-16 (×7): 2000 [IU] via ORAL
  Filled 2023-12-09 (×7): qty 2

## 2023-12-09 NOTE — Plan of Care (Signed)
  Problem: RH BOWEL ELIMINATION Goal: RH STG MANAGE BOWEL WITH ASSISTANCE Description: STG Manage Bowel with toileting Assistance. Outcome: Progressing Goal: RH STG MANAGE BOWEL W/MEDICATION W/ASSISTANCE Description: STG Manage Bowel with Medication with mod I Assistance. Outcome: Progressing   Problem: RH BLADDER ELIMINATION Goal: RH STG MANAGE BLADDER WITH ASSISTANCE Description: STG Manage Bladder With toileting Assistance Outcome: Progressing   Problem: RH SAFETY Goal: RH STG ADHERE TO SAFETY PRECAUTIONS W/ASSISTANCE/DEVICE Description: STG Adhere to Safety Precautions With cues  Assistance/Device. Outcome: Progressing   Problem: RH PAIN MANAGEMENT Goal: RH STG PAIN MANAGED AT OR BELOW PT'S PAIN GOAL Description: Pain < 4 with prns Outcome: Progressing

## 2023-12-09 NOTE — Plan of Care (Signed)
  Problem: Consults Goal: RH STROKE PATIENT EDUCATION Description: See Patient Education module for education specifics  Outcome: Progressing   Problem: RH BOWEL ELIMINATION Goal: RH STG MANAGE BOWEL WITH ASSISTANCE Description: STG Manage Bowel with toileting Assistance. Outcome: Progressing Goal: RH STG MANAGE BOWEL W/MEDICATION W/ASSISTANCE Description: STG Manage Bowel with Medication with mod I  Assistance. Outcome: Progressing   Problem: RH BLADDER ELIMINATION Goal: RH STG MANAGE BLADDER WITH ASSISTANCE Description: STG Manage Bladder With toileting Assistance Outcome: Progressing   Problem: RH SAFETY Goal: RH STG ADHERE TO SAFETY PRECAUTIONS W/ASSISTANCE/DEVICE Description: STG Adhere to Safety Precautions With cues Assistance/Device. Outcome: Progressing   Problem: RH PAIN MANAGEMENT Goal: RH STG PAIN MANAGED AT OR BELOW PT'S PAIN GOAL Description: Pain < 4 with prns Outcome: Progressing   Problem: RH KNOWLEDGE DEFICIT Goal: RH STG INCREASE KNOWLEDGE OF HYPERTENSION Description: Patient and dtr will be able to manage HTN using educational resources for medications and dietary modification independently Outcome: Progressing Goal: RH STG INCREASE KNOWLEDGE OF DYSPHAGIA/FLUID INTAKE Description: Patient and dtr will be able to manage dysphagia using educational resources for medications and dietary modification independently Outcome: Progressing Goal: RH STG INCREASE KNOWLEGDE OF HYPERLIPIDEMIA Description: Patient and dtr will be able to manage HLD using educational resources for medications and dietary modification independently Outcome: Progressing Goal: RH STG INCREASE KNOWLEDGE OF STROKE PROPHYLAXIS Description: Patient and dtr will be able to manage secondary risks using educational resources for medications and dietary modification independently Outcome: Progressing

## 2023-12-09 NOTE — Progress Notes (Signed)
 Inpatient Rehabilitation Center Individual Statement of Services  Patient Name:  [name] [name]  Date:  12/09/2023  Welcome to the Inpatient Rehabilitation Center.  Our goal is to provide you with an individualized program based on your diagnosis and situation, designed to meet your specific needs.  With this comprehensive rehabilitation program, you will be expected to participate in at least 3 hours of rehabilitation therapies Monday-Friday, with modified therapy programming on the weekends.  Your rehabilitation program will include the following services:  Physical Therapy (PT), Occupational Therapy (OT), Speech Therapy (ST), 24 hour per day rehabilitation nursing, Therapeutic Recreaction (TR), Neuropsychology, Care Coordinator, Rehabilitation Medicine, Nutrition Services, and Pharmacy Services  Weekly team conferences will be held on Wednesday to discuss your progress.  Your Inpatient Rehabilitation Care Coordinator will talk with you frequently to get your input and to update you on team discussions.  Team conferences with you and your family in attendance may also be held.  Expected length of stay:12 to 14 days  Overall anticipated outcome: Supervision/Verbal cueing   Depending on your progress and recovery, your program may change. Your Inpatient Rehabilitation Care Coordinator will coordinate services and will keep you informed of any changes. Your Inpatient Rehabilitation Care Coordinator's name and contact numbers are listed  below.  The following services may also be recommended but are not provided by the Inpatient Rehabilitation Center:  Driving Evaluations Home Health Rehabiltiation Services Outpatient Rehabilitation Services Vocational Rehabilitation   Arrangements will be made to provide these services after discharge if needed.  Arrangements include referral to agencies that provide these services.  Your insurance has been verified to be: [employer]  Your primary doctor  is:  [facility], [facility]   Pertinent information will be shared with your doctor and your insurance company.  Inpatient Rehabilitation Care Coordinator:  Rhoda Clement, KEN [phone] or [phone]  Information discussed with and copy given to patient by: Waverly Gentry, 12/09/2023, 9:05 AM

## 2023-12-09 NOTE — Progress Notes (Addendum)
 Physical Therapy Session Note  Patient Details  Name: [name] [name] MRN: [id number] Date of Birth: August 14, 1940  Today's Date: 12/09/2023 PT Individual Time: 0915-1015 PT Individual Time Calculation (min): 60 min   Short Term Goals: Week 1:  PT Short Term Goal 1 (Week 1): Pt will increase bed mobility to min A. PT Short Term Goal 2 (Week 1): Pt will increase transfers to min A. PT Short Term Goal 3 (Week 1): Pt will ambulate 50 feet with LRAD and min A PT Short Term Goal 4 (Week 1): Pt will ascend/descend 4 stairs with B rails and min A PT Short Term Goal 5 (Week 1): Pt will increase standing balance to min A  Skilled Therapeutic Interventions/Progress Updates:    Pt in bed finishing breakfast when PT arrived.  W/C not in pt's room. Pt agreeable for session, denies pain. Pt able to pull her pants up with bridging in bed. Pt performed x20 bridges, transferred OOB with min assist, sit to stand mod assist xHHA and took a few steps to w/c with moderate cueing to take big steps, cueing needed for hands placement, sequencing.  In w/c, pt practiced scooting to edge of chair, sit to stand x5.  Pt requires mod assist due to posterior leaning, amb with HHA x1, x15' cueing to increase stride length.  Pt then wheeled to main gym. Pt performed seated ab crunches x10, seated without back support, cueing to increase trunk extension.  Sit to stand holding onto PT shoulders, tapping spinal extensors for upright posture. Pt unable to sustain position >25 due to fatigue.  Gait tx in // bars walking forward/retro walking with CGA cueing to take big steps, pt able to return demo with RLE only.  Initial pattern of step to gait, near end of session, pt able to demonstrate LLE increased step length. Pt performed x30' total with x1 rest break.  Wheeled pt back to room, pt requesting to urinate.  Pt able to transfer w/c->commode mod assist with stand pivot with grab bars for support.  Pt had a BM, PT performed pericare and  wheeled into room when OT came for session.  Pt passed off to OT.   Session 2: Pt sitting at EOB when PT arrived.  Pt finishing lunch, agreeable for PT.  Pt amb from room to day gym with RW, Supervision, intermittent cueing for positioning RW to middle of hallway.  Noted pt veering to left periodically.  Gait is very slow, short stride.  Pt able to increase stride with cueing, however, will not sustain. When in day gym, pt went on NuStep x8' UE/LE level 1. Pt wheeled back to room due to fatigue. Pt up in chair with safety belt, call button within reach.    Therapy Documentation Precautions:  Precautions Precautions: Fall Recall of Precautions/Restrictions: Impaired Restrictions Weight Bearing Restrictions Per Provider Order: No  Pain: denies pain     Therapy/Group: Individual Therapy  Arland GORMAN Fast 12/09/2023, 9:19 AM

## 2023-12-09 NOTE — Progress Notes (Signed)
 Occupational Therapy Session Note  Patient Details  Name: [name] [name] MRN: [id number] Date of Birth: [birth year]  Today's Date: 12/09/2023 OT Individual Time: 1300-1345 OT Individual Time Calculation (min): 45 min    Short Term Goals: Week 1:  OT Short Term Goal 1 (Week 1): Patient will complete UB bathing and dressing with  MinA. OT Short Term Goal 2 (Week 1): The pt will complete LB bathing and dressing with MinA  incorporating AE as needed. OT Short Term Goal 3 (Week 1): The pt will improve UB strength to 4/5 MMT OT Short Term Goal 4 (Week 1): The pt will replicate a pattern after verbal instruction and a demonstration 4 of 5 occurrences. OT Short Term Goal 5 (Week 1): The pt will complete sit to stands with MinA incorporating with the RW with good anatomical positioning.  Skilled Therapeutic Interventions/Progress Updates:      Therapy Documentation Precautions:  Precautions Precautions: Fall Recall of Precautions/Restrictions: Impaired Restrictions Weight Bearing Restrictions Per Provider Order: No General: Pt supine in bed upon OT arrival, agreeable to OT session.  Pain: no pain reported  ADL: OT providing skilled intervention on ADL retraining in order to increase independence with tasks and increase activity tolerance. Pt completed the following tasks at the current level of assist: Bed mobility: Min A with assistance with trunk into elevation, increased time required and VC for initiation Grooming/oral hygiene: CGA standing at sink for hand hygiene Toilet transfer: Min A overall from bed><standard toilet with increased time and VC for initiation Toileting: Min A for stabilizing pt in standing to manage pants over waist, pt able to complete 3/3 steps of toileting, voiding urine   Pt supine in bed with bed alarm activated, 2 bed rails up, call light within reach and 4Ps assessed.   Therapy/Group: Individual Therapy  Camie Hoe, OTD, OTR/L 12/09/2023, 3:52 PM

## 2023-12-09 NOTE — Progress Notes (Signed)
 Occupational Therapy Session Note  Patient Details  Name: [name] [name] MRN: [id number] Date of Birth: 1941/02/06  Today's Date: 12/09/2023 OT Individual Time: 1015-1055 OT Individual Time Calculation (min): 40 min    Short Term Goals: Week 1:  OT Short Term Goal 1 (Week 1): Patient will complete UB bathing and dressing with  MinA. OT Short Term Goal 2 (Week 1): The pt will complete LB bathing and dressing with MinA  incorporating AE as needed. OT Short Term Goal 3 (Week 1): The pt will improve UB strength to 4/5 MMT OT Short Term Goal 4 (Week 1): The pt will replicate a pattern after verbal instruction and a demonstration 4 of 5 occurrences. OT Short Term Goal 5 (Week 1): The pt will complete sit to stands with MinA incorporating with the RW with good anatomical positioning.  Skilled Therapeutic Interventions/Progress Updates:    1:! Pt received from PT right before. Pt agreeable to showering. Pt transferred into the shower stand pivot with grab bars with contact guard with more than reasonable amt of time. Pt was able to wash all parts with contact guard for sit to stand with grab and with extra time. PT does require verbal cues for sequencing and organizing to move on from different parts and not return to same parted washed. Pt ambulated with RW with min A at very slow pace to w/c at sink to dress with setup with extra time for functional problem solving and cues for sustained to selective attention. Pt with difficulty sharing her thoughts whole working on a task- losing her focus on one of the tasks (talking or dressing). Pt required extra time for all tasks. Pt was successfully able to bring her feet up (in figure four position) to be able to assist with footwear). Pt returned to getting into bed with min A for the stand pivot transfer without RW and mod A to get into supine in the correct orientation of the bed. Bed alarm on and pt with call bell at her side.   Therapy  Documentation Precautions:  Precautions Precautions: Fall Recall of Precautions/Restrictions: Impaired Restrictions Weight Bearing Restrictions Per Provider Order: No  Pain:  No reports of pain    Therapy/Group: Individual Therapy  [name] 12/09/2023, 10:46 AM

## 2023-12-09 NOTE — Progress Notes (Signed)
 PROGRESS NOTE   Subjective/Complaints: Tolerated therapy well today Limited by fatigue, D3 increased to 2,000U Last BM 8/18, will d/c colace BP slightly elevated, continue amlodipine   ROS: Patient denies fever, rash, sore throat, blurred vision, dizziness, nausea, vomiting, diarrhea, cough, shortness of breath or chest pain, joint or back/neck pain, headache, or mood change.    Objective:   No results found. Recent Labs    12/08/23 0529  WBC 6.2  HGB 13.0  HCT 40.0  PLT 186   Recent Labs    12/08/23 0529  NA 140  K 4.1  CL 107  CO2 29  GLUCOSE 90  BUN 21  CREATININE 1.02*  CALCIUM  9.4    Intake/Output Summary (Last 24 hours) at 12/09/2023 1418 Last data filed at 12/08/2023 2320 Gross per 24 hour  Intake 238 ml  Output --  Net 238 ml        Physical Exam: Vital Signs Blood pressure (!) 145/70, pulse 65, temperature 97.8 F (36.6 C), temperature source Oral, resp. rate 18, height 5' 3 (1.6 m), weight 55.4 kg, SpO2 99%. Constitutional: No distress . Vital signs reviewed. HEENT: NCAT, EOMI, oral membranes moist Neck: supple Cardiovascular: RRR without murmur. No JVD    Respiratory/Chest: CTA Bilaterally without wheezes or rales. Normal effort    GI/Abdomen: BS +, non-tender, non-distended Ext: no clubbing, cyanosis, hands well perfused but cool. Psych: pleasant and cooperative  Skin: intact Musculoskeletal:        General: No swelling or tenderness. Normal range of motion.     Cervical back: Normal range of motion.  Skin:    General: Skin is warm and dry.     Comments: Bilateral TKA scars  Neurological:     Mental Status: She is alert.     Comments: Pt is alert, oriented to person, hospital, date with extra time. Word finding deficits still. Processing can be delayed. Fair awareness and insight.  CN exam non-focal. Speech generally clear. MMT: RUE 4+ to 5/5 prox to distal. LLE 4 to 4+/5 prox to  distal. RLE 4/5 HF 4+ KE,  5/5 ADF/PF. LLE 3- to 3/5 HF, 3/5 KE and 3-4/5 ADF/PF.--no change 8/18.  Sensory exam normal for light touch and pain in all 4 limbs. No limb ataxia or cerebellar signs. No abnormal tone appreciated.  DTR's 1+. Stable 8/19   Assessment/Plan: 1. Functional deficits which require 3+ hours per day of interdisciplinary therapy in a comprehensive inpatient rehab setting. Physiatrist is providing close team supervision and 24 hour management of active medical problems listed below. Physiatrist and rehab team continue to assess barriers to discharge/monitor patient progress toward functional and medical goals  Care Tool:  Bathing    Body parts bathed by patient: Right arm, Left arm, Abdomen, Right upper leg, Left upper leg, Face, Chest, Buttocks, Front perineal area, Right lower leg, Left lower leg   Body parts bathed by helper: Buttocks, Front perineal area, Right lower leg, Left lower leg, Left upper leg, Right upper leg Body parts n/a: Right lower leg, Left lower leg   Bathing assist Assist Level: Contact Guard/Touching assist     Upper Body Dressing/Undressing Upper body dressing   What is  the patient wearing?: Pull over shirt    Upper body assist Assist Level: Contact Guard/Touching assist    Lower Body Dressing/Undressing Lower body dressing      What is the patient wearing?: Incontinence brief, Pants     Lower body assist Assist for lower body dressing: Minimal Assistance - Patient > 75%     Toileting Toileting    Toileting assist Assist for toileting: Dependent - Patient 0%     Transfers Chair/bed transfer  Transfers assist     Chair/bed transfer assist level: Minimal Assistance - Patient > 75%     Locomotion Ambulation   Ambulation assist   Ambulation activity did not occur: Safety/medical concerns  Assist level: Minimal Assistance - Patient > 75% Assistive device: Parallel bars Max distance: 30'   Walk 10 feet  activity   Assist  Walk 10 feet activity did not occur: Safety/medical concerns  Assist level: Minimal Assistance - Patient > 75% Assistive device: Parallel bars   Walk 50 feet activity   Assist Walk 50 feet with 2 turns activity did not occur: Safety/medical concerns  Assist level: Minimal Assistance - Patient > 75% Assistive device: Walker-rolling    Walk 150 feet activity   Assist Walk 150 feet activity did not occur: Safety/medical concerns         Walk 10 feet on uneven surface  activity   Assist Walk 10 feet on uneven surfaces activity did not occur: Safety/medical concerns         Wheelchair     Assist Is the patient using a wheelchair?: Yes Type of Wheelchair: Manual    Wheelchair assist level: Dependent - Patient 0% Max wheelchair distance: 2'    Wheelchair 50 feet with 2 turns activity    Assist        Assist Level: Dependent - Patient 0%   Wheelchair 150 feet activity     Assist      Assist Level: Dependent - Patient 0%   Blood pressure (!) 145/70, pulse 65, temperature 97.8 F (36.6 C), temperature source Oral, resp. rate 18, height 5' 3 (1.6 m), weight 55.4 kg, SpO2 99%.  Medical Problem List and Plan: 1. 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  Antithrombotics: -DVT/anticoagulation:  Pharmaceutical: continue Heparin , SCDs ordered             -antiplatelet therapy: N/A  3. Pain Management: Tylenol  as needed  4. Mood/Behavior/Sleep: continue Cymbalta  60 mg daily, Namenda  10 mg twice daily             -antipsychotic agents: N/A  5.  Neuropsych/cognition: This patient is not capable of making decisions on her own behalf.  6. Constipation: last BM 8/18, decrease regimen to senna 2 tabs HS, mag gluconate 500mg  daily added, d/c colace   7. Fluids/Electrolytes/Nutrition: Routine in and outs with follow-up chemistries on Monday  8.  Hypertension.  Continue Norvasc  10 mg daily, Toprol -XL 12.5 mg daily.  Monitor with increased mobility, magnesium  supplement started- increase to 500mg   9.  Hyperlipidemia continue Crestor   10. Likely peripheral arterial  disease: would benefit from outpatient vascular follow-up, may benefit from outpatient Qutenza, d/c teds as worsens symptoms of cold feet  11. Fatigue: D3 increased to 2,000U daily  LOS: 4 days A FACE TO FACE EVALUATION WAS PERFORMED  Sven P Demarco Bacci 12/09/2023, 2:18 PM

## 2023-12-09 NOTE — Progress Notes (Signed)
 Inpatient Rehabilitation Care Coordinator Assessment and Plan Patient Details  Name: RENAY CRAMMER MRN: [id number] Date of Birth: [birth year]  Today's Date: 12/09/2023  Hospital Problems: Principal Problem:   Intraparenchymal hemorrhage of brain [facility])  Past Medical History:  Past Medical History:  Diagnosis Date   Acute kidney failure (HCC)    5 years ago   ACUTE KIDNEY FAILURE UNSPECIFIED 07/12/2009   Allergy    Anemia    Anxiety    Arthritis    Basal cell carcinoma    Blood clot in vein    states it was not deep vein   CAD (coronary artery disease)    Mild nonobstructive by CT   Cataract    Cerebral aneurysm without rupture 2021   Chronic kidney disease    stones   Depression    around the time of her husband's death   Diverticulosis of colon (without mention of hemorrhage)    Fibromyalgia    GERD (gastroesophageal reflux disease)    Headache    Hiatal hernia    History of kidney stones    Hyperlipidemia    Hypertension    Interstitial cystitis    Kidney stone    Osteopenia    PONV (postoperative nausea and vomiting)    Scoliosis    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   Qualifier: Diagnosis of  By: Jakie MD NOLIA Alm SAUNDERS    Vertigo    Past Surgical History:  Past Surgical History:  Procedure Laterality Date   BLADDER TUMOR EXCISION  2001   CATARACT EXTRACTION     bilateral   CHOLECYSTECTOMY     COLOSTOMY     CYSTECTOMY  2001   benign   ESOPHAGEAL MANOMETRY N/A 07/13/2012   Procedure: ESOPHAGEAL MANOMETRY (EM);  Surgeon: Alm SAUNDERS Jakie, MD;  Location: [facility] ENDOSCOPY;  Service: Endoscopy;  Laterality: N/A;   HIATAL HERNIA REPAIR     IR ANGIO INTRA EXTRACRAN SEL INTERNAL CAROTID BILAT MOD SED  11/02/2019   IR ANGIO INTRA EXTRACRAN SEL INTERNAL CAROTID BILAT MOD SED  10/31/2020   IR ANGIO VERTEBRAL SEL VERTEBRAL BILAT MOD SED  12/17/2019   IR ANGIO VERTEBRAL SEL VERTEBRAL UNI L MOD SED  10/31/2020   IR ANGIO VERTEBRAL SEL VERTEBRAL UNI R MOD SED   11/02/2019   IR ANGIOGRAM FOLLOW UP STUDY  12/17/2019   IR ANGIOGRAM FOLLOW UP STUDY  12/17/2019   IR ANGIOGRAM FOLLOW UP STUDY  12/17/2019   IR ANGIOGRAM FOLLOW UP STUDY  12/17/2019   IR NEURO EACH ADD'L AFTER BASIC UNI LEFT (MS)  12/17/2019   IR TRANSCATH/EMBOLIZ  12/17/2019   LUMBAR DISC SURGERY     x 2 elsner   RADIOLOGY WITH ANESTHESIA N/A 12/17/2019   Procedure: 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;  Surgeon: Lanis Pupa, MD;  Location[facility];  Service: Radiology;  Laterality: N/A;   REVERSE SHOULDER ARTHROPLASTY Right 08/12/2014   Procedure: RIGHT REVERSE TOTAL SHOULDER ARTHROPLASTY;  Surgeon: Marcey Her, MD;  Location: [facility] OR;  Service: Orthopedics;  Laterality: Right;   TENDON REPAIR Right 11/2005   right shoulder   TOTAL KNEE ARTHROPLASTY Left    left   TOTAL KNEE ARTHROPLASTY Right 11/17/2015   Procedure: RIGHT TOTAL KNEE ARTHROPLASTY;  Surgeon: Lamar Collet, MD;  Location: [facility] ORS;  Service: Orthopedics;  Laterality: Right;   UPPER GASTROINTESTINAL ENDOSCOPY     Social History:  reports that she has never smoked. She has never used smokeless tobacco. She reports that she does not currently  use alcohol. She reports that she does not use drugs.  Family / Support Systems Marital Status: Widow/Widower How Long?: 16 years Patient Roles: Parent Spouse/Significant Other: Nori Winegar Children: Joen [phone] and Wadie [phone] Caregiver Availability: 24/7 (35 hours of caregiver services, 1 aunt, Joen and intermittent support from other famiyl members)  Social History Preferred language: English Religion: [employer] Cultural Background: N/a Health Literacy - How often do you need to have someone help you when you read instructions, pamphlets, or other written material from your doctor or pharmacy?: Always Writes: No Employment Status: Retired Date Retired/Disabled/Unemployed: 16 years Age Retired: 67   Abuse/Neglect Abuse/Neglect  Assessment Can Be Completed: Unable to assess, patient is non-responsive or altered mental status  Patient response to: Social Isolation - How often do you feel lonely or isolated from those around you?: Never  Emotional Status Pt's affect, behavior and adjustment status: Patient has altered mental status Psychiatric History: No Substance Abuse History: No  Patient / Family Perceptions, Expectations & Goals Pt/Family understanding of illness & functional limitations: Family understansding of illness & functional limitations Premorbid pt/family roles/activities: Family providing care as needed Pt/family expectations/goals: Family has expectations of patient returning back home with caregiver support as well as intermittent support from family  [employer]: None Transportation available at discharge: Joen will provide transportation Is the patient able to respond to transportation needs?: No In the past 12 months, has lack of transportation kept you from medical appointments or from getting medications?: No In the past 12 months, has lack of transportation kept you from meetings, work, or from getting things needed for daily living?: No Resource referrals recommended: Neuropsychology  Discharge Planning Living Arrangements: Alone Support Systems: Children, Other relatives Type of Residence: Private residence Insurance Resources: Medicare Financial Screen Referred: No Living Expenses: Own Does the patient have any problems obtaining your medications?: No Home Management: [facility] Coordinator Anticipated Follow Up Needs: HH/OP Expected length of stay: ^ 14 days  Clinical Impression CSW met with patient initially to introduce herself and complete assessment. Patient is an 83 y/o female who presented to [facility] on 8/12 with difficulty ambulating. SW noticed that patient is able to answer closed ended questions and is unable to recall information such  as her birthday and her age. Overall, she is not capable of making her own decisions. SW was able to meet with patients daughter Joen to complete assessment. Sherry's son, Ester, and patients brother Jenene both present during meeting. Patient lives alone in a [place] home. She is a widow to her second husband, Beryl. She is retired from Therapist, sports. Joen reports that she had a few hours of caregiver support from her husbands aunt 3-4 days out of the week prior to admission. Joen also supports during her lunch periods and in the evenings to bring her mother dinner.Joen is hoping to take advantage of the 35 hours of caregiver services offered through patients insurance to provide care. Joen will provide transportation upon discharge.There were no further questions or concerns at present. SW will continue to follow until discharge.   Di'Asia  Loreli 12/09/2023, 8:52 AM

## 2023-12-10 DIAGNOSIS — G309 Alzheimer's disease, unspecified: Secondary | ICD-10-CM

## 2023-12-10 DIAGNOSIS — F02B0 Dementia in other diseases classified elsewhere, moderate, without behavioral disturbance, psychotic disturbance, mood disturbance, and anxiety: Secondary | ICD-10-CM

## 2023-12-10 LAB — URINALYSIS, ROUTINE W REFLEX MICROSCOPIC
Bilirubin Urine: NEGATIVE
Glucose, UA: NEGATIVE mg/dL
Hgb urine dipstick: NEGATIVE
Ketones, ur: NEGATIVE mg/dL
Nitrite: POSITIVE — AB
Protein, ur: NEGATIVE mg/dL
Specific Gravity, Urine: 1.023 (ref 1.005–1.030)
pH: 5 (ref 5.0–8.0)

## 2023-12-10 LAB — BASIC METABOLIC PANEL WITH GFR
Anion gap: 12 (ref 5–15)
BUN: 28 mg/dL — ABNORMAL HIGH (ref 8–23)
CO2: 24 mmol/L (ref 22–32)
Calcium: 9.6 mg/dL (ref 8.9–10.3)
Chloride: 103 mmol/L (ref 98–111)
Creatinine, Ser: 1.43 mg/dL — ABNORMAL HIGH (ref 0.44–1.00)
GFR, Estimated: 37 mL/min — ABNORMAL LOW (ref >60)
Glucose, Bld: 111 mg/dL — ABNORMAL HIGH (ref 70–99)
Potassium: 4.6 mmol/L (ref 3.5–5.1)
Sodium: 139 mmol/L (ref 135–145)

## 2023-12-10 LAB — CBC WITH DIFFERENTIAL/PLATELET
Abs Immature Granulocytes: 0.02 K/uL (ref 0.00–0.07)
Basophils Absolute: 0 K/uL (ref 0.0–0.1)
Basophils Relative: 1 %
Eosinophils Absolute: 0.1 K/uL (ref 0.0–0.5)
Eosinophils Relative: 1 %
HCT: 45.1 % (ref 36.0–46.0)
Hemoglobin: 14.3 g/dL (ref 12.0–15.0)
Immature Granulocytes: 0 %
Lymphocytes Relative: 15 %
Lymphs Abs: 0.9 K/uL (ref 0.7–4.0)
MCH: 29.4 pg (ref 26.0–34.0)
MCHC: 31.7 g/dL (ref 30.0–36.0)
MCV: 92.6 fL (ref 80.0–100.0)
Monocytes Absolute: 0.4 K/uL (ref 0.1–1.0)
Monocytes Relative: 6 %
Neutro Abs: 4.8 K/uL (ref 1.7–7.7)
Neutrophils Relative %: 77 %
Platelets: 222 K/uL (ref 150–400)
RBC: 4.87 MIL/uL (ref 3.87–5.11)
RDW: 13.4 % (ref 11.5–15.5)
WBC: 6.2 K/uL (ref 4.0–10.5)
nRBC: 0 % (ref 0.0–0.2)

## 2023-12-10 MED ORDER — SODIUM CHLORIDE 0.9 % IV SOLN: INTRAVENOUS | Status: DC

## 2023-12-10 MED ORDER — AMANTADINE HCL 100 MG PO CAPS
100.0000 mg | ORAL_CAPSULE | Freq: Every day | ORAL | Status: DC
  Administered 2023-12-11 – 2023-12-16 (×6): 100 mg via ORAL
  Filled 2023-12-10 (×6): qty 1

## 2023-12-10 MED ORDER — L-METHYLFOLATE-B6-B12 3-35-2 MG PO TABS
1.0000 | ORAL_TABLET | Freq: Every day | ORAL | Status: DC
  Administered 2023-12-10 – 2023-12-16 (×7): 1 via ORAL
  Filled 2023-12-10 (×7): qty 1

## 2023-12-10 NOTE — Progress Notes (Signed)
 Patient ID: [name] [name], female   DOB: 03-May-1940, 83 y.o.   MRN: [id number]  Left a voicemail with Joen daughter [phone] about family education for herself and the caregiver on 8/22 between 10am-12pm. Will reach out again to confirm when available.

## 2023-12-10 NOTE — Progress Notes (Signed)
 Occupational Therapy Session Note  Patient Details  Name: [name] [name] MRN: [id number] Date of Birth: [birth year]  Today's Date: 12/10/2023 OT Individual Time: 1133-1200 OT Individual Time Calculation (min): 27 min    Short Term Goals: Week 1:  OT Short Term Goal 1 (Week 1): Patient will complete UB bathing and dressing with  MinA. OT Short Term Goal 2 (Week 1): The pt will complete LB bathing and dressing with MinA  incorporating AE as needed. OT Short Term Goal 3 (Week 1): The pt will improve UB strength to 4/5 MMT OT Short Term Goal 4 (Week 1): The pt will replicate a pattern after verbal instruction and a demonstration 4 of 5 occurrences. OT Short Term Goal 5 (Week 1): The pt will complete sit to stands with MinA incorporating with the RW with good anatomical positioning.  Skilled Therapeutic Interventions/Progress Updates:   Patient received supine in bed.  Denied need to use the bathroom.  Provided brief orientation.  Patient needing max cueing to try to read therapy schedule, locate clock or calendar in room.  Patient agreeable to get up and out of bed.  Patient with severely impaired initiation and motor planning deficits.  Patient needs little physical assistance but consistent cueing and increased time to transition from supine to sitting.  Patient can stand with min assist, however starts with significant posterior bias.  Light facilitation seems to be most effective to help her find midline.  Patient placed in front of sink to complete grooming.  Left up in wheelchair with safety belt in place and engaged and call bell / personal items in reach.    Therapy Documentation Precautions:  Precautions Precautions: Fall Recall of Precautions/Restrictions: Impaired Restrictions Weight Bearing Restrictions Per Provider Order: No  Pain: Pain Assessment Pain Scale: 0-10 Pain Score: 0-No pain     Therapy/Group: Individual Therapy  Qunicy Higinbotham M 12/10/2023, 12:11 PM

## 2023-12-10 NOTE — Progress Notes (Signed)
 Speech Language Pathology Daily Session Note  Patient Details  Name: [name] [name] MRN: [id number] Date of Birth: Apr 26, 1940  Today's Date: 12/10/2023 SLP Individual Time: 1000-1053 SLP Individual Time Calculation (min): 53 min  Short Term Goals: Week 1: SLP Short Term Goal 1 (Week 1): Patient will utilize external memory aids to recall daily information with 80% accuracy given mod assist. SLP Short Term Goal 2 (Week 1): Patient will complete basic functional problem solving tasks with 80% accuracy given mod multimodal cues. SLP Short Term Goal 3 (Week 1): Patient will maintain topic during conversation re: biographical information with mod cues from conversation partner. SLP Short Term Goal 4 (Week 1): Patient will orient to year and month using external aids in 4/5 opportunities given mod assist.  Skilled Therapeutic Interventions: SLP conducted skilled therapy session targeting cognitive goals. Patient benefited from mod to max assist to interpret daily schedule to anticipate upcoming therapy sessions. Patient correctly identified that she had not yet received any therapy prior to SLP arrival. SLP conducted calendar task where patient required hand over hand assist to number calendar and indicate date via crossing out dates that have passed. Patient required total assist to set a clock to an indicated time but only min assist to read clock set by SLP. Patient then benefited from max assist to count basic amounts of money and required reduction of items in field to promote sustained attention. Patient required max assist for sustained attention and topic maintenance throughout. Patient was left in room with call bell in reach and alarm set. SLP will continue to target goals per plan of care.        Pain  Reported pain in hands but unable to state number  Therapy/Group: Individual Therapy  Karysa Heft, M.A., CCC-SLP  Maicie Vanderloop A Memori Sammon 12/10/2023, 11:28 AM

## 2023-12-10 NOTE — Patient Care Conference (Signed)
 Inpatient RehabilitationTeam Conference and Plan of Care Update Date: 12/10/2023   Time: 11:16 AM    Patient Name: [name] [name]      Medical Record Number: [id number]  Date of Birth: 04-18-1941 Sex: Female         Room/Bed: 4W26C/4W26C-01 Payor Info: Payor: [employer] MEDICARE / Plan: [employer] MEDICARE HMO / Product Type: *No Product type* /    Admit Date/Time:  12/05/2023  3:47 PM  Primary Diagnosis:  Intraparenchymal hemorrhage of brain [facility])  Hospital Problems: Principal Problem:   Intraparenchymal hemorrhage of brain [facility])    Expected Discharge Date: Expected Discharge Date: 12/16/23  Team Members Present: Physician leading conference: Dr. Sven Elks Social Worker Present: Other (comment) Rilla Martina Gentry, SW) Nurse Present: Barnie Ronde, RN PT Present: Sherlean Perks, PT OT Present: Monica Peacock, OT SLP Present: Rosina Downy, SLP PPS Coordinator present : Eleanor Colon, SLP     Current Status/Progress Goal Weekly Team Focus  Bowel/Bladder     Mixed continence    Continent of bowel and bladder   Toilet protocol  Swallow/Nutrition/ Hydration               ADL's   Min A toileting (only d/t stability), Min-Mod A LB ADLs, CGA bathing, Min A transfers, increased time to complete tasks and VC required for direction to task and sequencing   Min LB dressing/toileting, Supervision   sequencing ADL tasks, ADL retraining    Mobility   CGA/minA for functional mobility, ambulating ~100-133ft with RW. Slow to initiate and process, needs ++ time. Baseline dementia   Supervision  gait training, balance training, DC planning    Communication                Safety/Cognition/ Behavioral Observations  Max assist for basic problem solving (has definitely improved since eval), mod-max assist for orientation with use of external aids, max for awareness of deficits, max assist for recall of daily events   modA   system for tracking water  consumption per MD, orientation with use  of external aids, basic problem solving, schedule interpretation, family education, initiation, working memory    Pain      Arthritis pain in hands   Pain < 4 with prns     Prn meds ordered  Skin      N/A           Discharge Planning:  Discharge home with 24/7 intermittent care from family/friends until 35 hours of caregiver suport is secured through insurance. Awaiting follow up recommendations from PT/OT.   Team Discussion: Patient admitted post IPH with delayed processing and sequencing, poor initiation, problem solving and progress limited by fear of falling.  Patient on target to meet rehab goals: yes, currently needs min assist ADLs. Needs CGA for transfers. Needs CGA - min assist to ambulate up to 100' using a RW. (TAG 2.5 min for 10').  Needs max assist for cognition. Goals for discharge set for supervision overall.  *See Care Plan and progress notes for long and short-term goals.   Revisions to Treatment Plan:  UA and culture   Teaching Needs: Safety, medications, transfers, toileting, etc.   Current Barriers to Discharge: Decreased caregiver support and Home enviroment access/layout  Possible Resolutions to Barriers: Family education     Medical Summary Current Status: hand pain, HTN, constipation, dementia, AKI  Barriers to Discharge: Medical stability  Barriers to Discharge Comments: hand pain, HTN, constipation, dementia, AKI Possible Resolutions to [employer] Focus: continue cymbalta , continue amlodipine /toproll XL/magnesium   supplement/monitoring BP TID, continue senna HS, continue namenda , continue to monitor creatinine, discussed with therapy doing water  chart for her   Continued Need for Acute Rehabilitation Level of Care: The patient requires daily medical management by a physician with specialized training in physical medicine and rehabilitation for the following reasons: Direction of a multidisciplinary physical rehabilitation program to  maximize functional independence : Yes Medical management of patient stability for increased activity during participation in an intensive rehabilitation regime.: Yes Analysis of laboratory values and/or radiology reports with any subsequent need for medication adjustment and/or medical intervention. : Yes   I attest that I was present, lead the team conference, and concur with the assessment and plan of the team.   Fredericka Barnie NOVAK 12/10/2023, 3:54 PM

## 2023-12-10 NOTE — Progress Notes (Signed)
 Physical Therapy Session Note  Patient Details  Name: [name] [name] MRN: [id number] Date of Birth: 05-Aug-1940  Today's Date: 12/10/2023 PT Individual Time: 8553-8443 PT Individual Time Calculation (min): 70 min   Short Term Goals: Week 1:  PT Short Term Goal 1 (Week 1): Pt will increase bed mobility to min A. PT Short Term Goal 2 (Week 1): Pt will increase transfers to min A. PT Short Term Goal 3 (Week 1): Pt will ambulate 50 feet with LRAD and min A PT Short Term Goal 4 (Week 1): Pt will ascend/descend 4 stairs with B rails and min A PT Short Term Goal 5 (Week 1): Pt will increase standing balance to min A  Skilled Therapeutic Interventions/Progress Updates:      Pt presents sitting in wheelchair - pt in agreement to therapy session. No complaints of pain. Pt has difficulty recalling details of her Neuropsych visit - was aware he came but unable to recall what was discussed.   Pt transported from her room to the main gym at w/c level. Sit<>stand to RW with CGA with cues needed for hand placement - initially attempts to stand with both hands on RW rather than pushing from arm rests. She ambulated with CGA and RW ~62ft + 81ft with cues for safety awareness and to keep body within walker frame. She tends to veer L and has difficulty initiating and being aware of this. Challenged her with unsupported ambulation with no HHA - pt ambulated ~138ft with minA with similar shuffling steps and cautious gait speed. Pt with curvature of her spine , more pronounced in standing.    Pt instructed in standing task to work on tolerance, awareness, and simple problem solving. With table top support from BUE, she was able to complete task at supervision level with cues for awareness and balance. Pt standing > 10 minutes without rest break or LOB.   Pt returned to her room and was left sitting up in w/c with safety belt alarm on and call bell in lap.   Therapy Documentation Precautions:   Precautions Precautions: Fall Recall of Precautions/Restrictions: Impaired Restrictions Weight Bearing Restrictions Per Provider Order: No General:      Therapy/Group: Individual Therapy  Sherlean SHAUNNA Perks 12/10/2023, 7:49 AM

## 2023-12-10 NOTE — Progress Notes (Signed)
 VAT: Consult for PIV  Pt refused PIV insertion. Care RN notified.   Consult complete

## 2023-12-10 NOTE — Plan of Care (Signed)
 Goals downgraded due to baseline cognitive status  Problem: RH Cognition - SLP Goal: RH LTG Patient will demonstrate orientation with cues Description:  LTG:  Patient will demonstrate orientation to person/place/time/situation with cues (SLP)   Flowsheets Taken 12/10/2023 1428 by Waymon Memory, Student-SLP LTG: Patient will demonstrate orientation using cueing (SLP): Moderate Assistance - Patient 50 - 74% Taken 12/06/2023 1340 by Bea Knee A, CCC-SLP LTG Patient will demonstrate orientation to:  Person  Place  Time  Situation   Problem: RH Problem Solving Goal: LTG Patient will demonstrate problem solving for (SLP) Description: LTG:  Patient will demonstrate problem solving for basic/complex daily situations with cues  (SLP) Flowsheets Taken 12/10/2023 1428 by Waymon Memory, Student-SLP LTG Patient will demonstrate problem solving for: Moderate Assistance - Patient 50 - 74% Taken 12/06/2023 1340 by Bea Knee A, CCC-SLP LTG: Patient will demonstrate problem solving for (SLP): Basic daily situations   Problem: RH Memory Goal: LTG Patient will use memory compensatory aids to (SLP) Description: LTG:  Patient will use memory compensatory aids to recall biographical/new, daily complex information with cues (SLP) Flowsheets (Taken 12/10/2023 1428) LTG: Patient will use memory compensatory aids to (SLP): Moderate Assistance - Patient 50 - 74%   Problem: RH Attention Goal: LTG Patient will demonstrate this level of attention during functional activites (SLP) Description: LTG:  Patient will will demonstrate this level of attention during functional activites (SLP) Flowsheets Taken 12/10/2023 1428 by Waymon Memory, Student-SLP LTG: Patient will demonstrate this level of attention during cognitive/linguistic activities with assistance of (SLP): Moderate Assistance - Patient 50 - 74% Taken 12/06/2023 1340 by Bea Knee LABOR, CCC-SLP Patient will demonstrate  during cognitive/linguistic activities the attention type of: Sustained Patient will demonstrate this level of attention during cognitive/linguistic activities in: Controlled Number of minutes patient will demonstrate attention during cognitive/linguistic activities: 10
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
## 2023-12-10 NOTE — Plan of Care (Signed)
  Problem: Consults Goal: RH STROKE PATIENT EDUCATION Description: See Patient Education module for education specifics  Outcome: Progressing   Problem: RH BOWEL ELIMINATION Goal: RH STG MANAGE BOWEL WITH ASSISTANCE Description: STG Manage Bowel with toileting Assistance. Outcome: Progressing Goal: RH STG MANAGE BOWEL W/MEDICATION W/ASSISTANCE Description: STG Manage Bowel with Medication with mod I  Assistance. Outcome: Progressing   Problem: RH BLADDER ELIMINATION Goal: RH STG MANAGE BLADDER WITH ASSISTANCE Description: STG Manage Bladder With toileting Assistance Outcome: Progressing   Problem: RH SAFETY Goal: RH STG ADHERE TO SAFETY PRECAUTIONS W/ASSISTANCE/DEVICE Description: STG Adhere to Safety Precautions With cues Assistance/Device. Outcome: Progressing   Problem: RH PAIN MANAGEMENT Goal: RH STG PAIN MANAGED AT OR BELOW PT'S PAIN GOAL Description: Pain < 4 with prns Outcome: Progressing   Problem: RH KNOWLEDGE DEFICIT Goal: RH STG INCREASE KNOWLEDGE OF HYPERTENSION Description: Patient and dtr will be able to manage HTN using educational resources for medications and dietary modification independently Outcome: Progressing Goal: RH STG INCREASE KNOWLEDGE OF DYSPHAGIA/FLUID INTAKE Description: Patient and dtr will be able to manage dysphagia using educational resources for medications and dietary modification independently Outcome: Progressing Goal: RH STG INCREASE KNOWLEGDE OF HYPERLIPIDEMIA Description: Patient and dtr will be able to manage HLD using educational resources for medications and dietary modification independently Outcome: Progressing Goal: RH STG INCREASE KNOWLEDGE OF STROKE PROPHYLAXIS Description: Patient and dtr will be able to manage secondary risks using educational resources for medications and dietary modification independently Outcome: Progressing

## 2023-12-10 NOTE — Progress Notes (Signed)
 PROGRESS NOTE   Subjective/Complaints: No new complaints this morning Does have some hand pain Team conference today Asked SLP to do water  chart with her  ROS: Patient denies fever, rash, sore throat, blurred vision, dizziness, nausea, vomiting, diarrhea, cough, shortness of breath or chest pain, joint or back/neck pain, headache, or mood change.    Objective:   No results found. Recent Labs    12/08/23 0529  WBC 6.2  HGB 13.0  HCT 40.0  PLT 186   Recent Labs    12/08/23 0529  NA 140  K 4.1  CL 107  CO2 29  GLUCOSE 90  BUN 21  CREATININE 1.02*  CALCIUM  9.4    Intake/Output Summary (Last 24 hours) at 12/10/2023 1120 Last data filed at 12/09/2023 1915 Gross per 24 hour  Intake 280 ml  Output --  Net 280 ml        Physical Exam: Vital Signs Blood pressure 125/79, pulse 75, temperature 98 F (36.7 C), temperature source Oral, resp. rate 17, height 5' 3 (1.6 m), weight 54.5 kg, SpO2 98%. Constitutional: No distress . Vital signs reviewed. HEENT: NCAT, EOMI, oral membranes moist Neck: supple Cardiovascular: RRR without murmur. No JVD    Respiratory/Chest: CTA Bilaterally without wheezes or rales. Normal effort    GI/Abdomen: BS +, non-tender, non-distended Ext: no clubbing, cyanosis, hands well perfused but cool. Psych: pleasant and cooperative  Skin: intact Musculoskeletal:        General: No swelling or tenderness. Normal range of motion.     Cervical back: Normal range of motion.  Skin:    General: Skin is warm and dry.     Comments: Bilateral TKA scars  Neurological:     Mental Status: She is alert.     Comments: Pt is alert, oriented to person, hospital, date with extra time. Word finding deficits still. Processing can be delayed. Fair awareness and insight.  CN exam non-focal. Speech generally clear. MMT: RUE 4+ to 5/5 prox to distal. LLE 4 to 4+/5 prox to distal. RLE 4/5 HF 4+ KE,  5/5 ADF/PF.  LLE 3- to 3/5 HF, 3/5 KE and 3-4/5 ADF/PF.--no change 8/18.  Sensory exam normal for light touch and pain in all 4 limbs. No limb ataxia or cerebellar signs. No abnormal tone appreciated.  DTR's 1+. Stable 8/20   Assessment/Plan: 1. Functional deficits which require 3+ hours per day of interdisciplinary therapy in a comprehensive inpatient rehab setting. Physiatrist is providing close team supervision and 24 hour management of active medical problems listed below. Physiatrist and rehab team continue to assess barriers to discharge/monitor patient progress toward functional and medical goals  Care Tool:  Bathing    Body parts bathed by patient: Right arm, Left arm, Abdomen, Right upper leg, Left upper leg, Face, Chest, Buttocks, Front perineal area, Right lower leg, Left lower leg   Body parts bathed by helper: Buttocks, Front perineal area, Right lower leg, Left lower leg, Left upper leg, Right upper leg Body parts n/a: Right lower leg, Left lower leg   Bathing assist Assist Level: Contact Guard/Touching assist     Upper Body Dressing/Undressing Upper body dressing   What is the patient  wearing?: Pull over shirt    Upper body assist Assist Level: Contact Guard/Touching assist    Lower Body Dressing/Undressing Lower body dressing      What is the patient wearing?: Incontinence brief, Pants     Lower body assist Assist for lower body dressing: Minimal Assistance - Patient > 75%     Toileting Toileting    Toileting assist Assist for toileting: Dependent - Patient 0%     Transfers Chair/bed transfer  Transfers assist     Chair/bed transfer assist level: Contact Guard/Touching assist     Locomotion Ambulation   Ambulation assist   Ambulation activity did not occur: Safety/medical concerns  Assist level: Contact Guard/Touching assist Assistive device: Walker-rolling Max distance: 180'   Walk 10 feet activity   Assist  Walk 10 feet activity did not occur:  Safety/medical concerns  Assist level: Contact Guard/Touching assist Assistive device: Walker-rolling   Walk 50 feet activity   Assist Walk 50 feet with 2 turns activity did not occur: Safety/medical concerns  Assist level: Minimal Assistance - Patient > 75% Assistive device: Walker-rolling    Walk 150 feet activity   Assist Walk 150 feet activity did not occur: Safety/medical concerns  Assist level: Supervision/Verbal cueing Assistive device: Walker-rolling    Walk 10 feet on uneven surface  activity   Assist Walk 10 feet on uneven surfaces activity did not occur: Safety/medical concerns         Wheelchair     Assist Is the patient using a wheelchair?: Yes Type of Wheelchair: Manual    Wheelchair assist level: Dependent - Patient 0% Max wheelchair distance: 175'    Wheelchair 50 feet with 2 turns activity    Assist        Assist Level: Dependent - Patient 0%   Wheelchair 150 feet activity     Assist      Assist Level: Dependent - Patient 0%   Blood pressure 125/79, pulse 75, temperature 98 F (36.7 C), temperature source Oral, resp. rate 17, height 5' 3 (1.6 m), weight 54.5 kg, SpO2 98%.  Medical Problem List and Plan: 1. 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  Antithrombotics: -DVT/anticoagulation:  Pharmaceutical: continue Heparin , SCDs ordered             -antiplatelet therapy: N/A  3. Pain Management: Tylenol  as needed  4. Mood/Behavior/Sleep: continue Cymbalta  60 mg daily, Namenda  10 mg twice daily             -antipsychotic agents: N/A  5.  Neuropsych/cognition: This patient is not capable of making decisions on her own behalf.  6. Constipation: last BM 8/18, decrease regimen to senna 2 tabs HS, mag gluconate 500mg  daily added, d/c colace, messaged nursing to confirm date of last BM   7. Fluids/Electrolytes/Nutrition: Routine in and outs with follow-up chemistries on Monday  8.  Hypertension.  Continue Norvasc  10 mg daily, Toprol -XL 12.5 mg daily.  Monitor with increased mobility, magnesium  supplement started- continue 500mg   9.  Hyperlipidemia continue Crestor   10. Likely peripheral arterial disease:  would benefit from outpatient vascular follow-up, may benefit from outpatient Qutenza, d/c teds as worsens symptoms of cold feet  11. Fatigue: D3 increased to 2,000U daily, continue  12. AKI: asked SLP to work with her on a daily water  chart  LOS: 5 days A FACE TO FACE EVALUATION WAS PERFORMED  [name] [name] [name] [name] 12/10/2023, 11:20 AM

## 2023-12-10 NOTE — Progress Notes (Signed)
 Occupational Therapy Session Note  Patient Details  Name: [name] [name] MRN: [id number] Date of Birth: [birth year]  Today's Date: 12/10/2023 OT Individual Time: 1301-1403 OT Individual Time Calculation (min): 62 min    Short Term Goals: Week 1:  OT Short Term Goal 1 (Week 1): Patient will complete UB bathing and dressing with  MinA. OT Short Term Goal 2 (Week 1): The pt will complete LB bathing and dressing with MinA  incorporating AE as needed. OT Short Term Goal 3 (Week 1): The pt will improve UB strength to 4/5 MMT OT Short Term Goal 4 (Week 1): The pt will replicate a pattern after verbal instruction and a demonstration 4 of 5 occurrences. OT Short Term Goal 5 (Week 1): The pt will complete sit to stands with MinA incorporating with the RW with good anatomical positioning.  Skilled Therapeutic Interventions/Progress Updates:    Patient received seated in wheelchair finishing her lunch.  Patient has spaghetti spilled over her shirt, pants, and smeared all over her face.  Patient aware and agreeable to shower.  Walked with RW to bathroom with min assist to manage walker - patient with tendency to walk with hip flexion and push walker well in front of her.  Patient shuffles feet while walking.  Indicated need to void when offered, brief saturated, and patient able to void on toilet.  Urine strong smelling and cloudy in appearance.  Secure message sent to medical team.  Patient then walked to shower and sat on transfer bench to bathe herself.  Patient perseverates on washing hair and face, and needs cueing to wash remaining parts of body.  Patient transferred to wheelchair to make best use of therapy time.  Patient with poor initiation and very delayed response to directives or next steps of familiar functional tasks.  Patient left up in wheelchair at end of session with safety belt in place and engaged and call bell/ personal items in reach.    Therapy Documentation Precautions:   Precautions Precautions: Fall Recall of Precautions/Restrictions: Impaired Restrictions Weight Bearing Restrictions Per Provider Order: No   Pain: Pain Assessment Pain Scale: 0-10 Pain Score: 0-No pain    Therapy/Group: Individual Therapy  Macyn Shropshire M 12/10/2023, 3:13 PM

## 2023-12-11 LAB — BASIC METABOLIC PANEL WITH GFR
Anion gap: 11 (ref 5–15)
BUN: 27 mg/dL — ABNORMAL HIGH (ref 8–23)
CO2: 25 mmol/L (ref 22–32)
Calcium: 9.3 mg/dL (ref 8.9–10.3)
Chloride: 107 mmol/L (ref 98–111)
Creatinine, Ser: 1.09 mg/dL — ABNORMAL HIGH (ref 0.44–1.00)
GFR, Estimated: 51 mL/min — ABNORMAL LOW (ref >60)
Glucose, Bld: 78 mg/dL (ref 70–99)
Potassium: 3.9 mmol/L (ref 3.5–5.1)
Sodium: 143 mmol/L (ref 135–145)

## 2023-12-11 MED ORDER — CEPHALEXIN 250 MG PO CAPS
500.0000 mg | ORAL_CAPSULE | Freq: Two times a day (BID) | ORAL | Status: DC
  Administered 2023-12-11 – 2023-12-16 (×11): 500 mg via ORAL
  Filled 2023-12-11 (×11): qty 2

## 2023-12-11 MED ORDER — DOCUSATE SODIUM 100 MG PO CAPS
100.0000 mg | ORAL_CAPSULE | Freq: Every day | ORAL | Status: DC
  Administered 2023-12-11 – 2023-12-16 (×6): 100 mg via ORAL
  Filled 2023-12-11 (×6): qty 1

## 2023-12-11 NOTE — Plan of Care (Signed)
  Problem: RH Balance Goal: LTG Patient will maintain dynamic standing balance (PT) Description: LTG:  Patient will maintain dynamic standing balance with assistance during mobility activities (PT) Flowsheets (Taken 12/11/2023 1658) LTG: Pt will maintain dynamic standing balance during mobility activities with:: Minimal Assistance - Patient > 75%   Problem: Sit to Stand Goal: LTG:  Patient will perform sit to stand with assistance level (PT) Description: LTG:  Patient will perform sit to stand with assistance level (PT) Flowsheets (Taken 12/11/2023 1658) LTG: PT will perform sit to stand in preparation for functional mobility with assistance level: Minimal Assistance - Patient > 75%   Problem: RH Bed Mobility Goal: LTG Patient will perform bed mobility with assist (PT) Description: LTG: Patient will perform bed mobility with assistance, with/without cues (PT). Flowsheets (Taken 12/11/2023 1658) LTG: Pt will perform bed mobility with assistance level of: Minimal Assistance - Patient > 75%   Problem: RH Bed to Chair Transfers Goal: LTG Patient will perform bed/chair transfers w/assist (PT) Description: LTG: Patient will perform bed to chair transfers with assistance (PT). Flowsheets (Taken 12/11/2023 1658) LTG: Pt will perform Bed to Chair Transfers with assistance level: Minimal Assistance - Patient > 75%   Problem: RH Car Transfers Goal: LTG Patient will perform car transfers with assist (PT) Description: LTG: Patient will perform car transfers with assistance (PT). Flowsheets (Taken 12/11/2023 1658) LTG: Pt will perform car transfers with assist:: Minimal Assistance - Patient > 75%   Problem: RH Ambulation Goal: LTG Patient will ambulate in controlled environment (PT) Description: LTG: Patient will ambulate in a controlled environment, # of feet with assistance (PT). Flowsheets (Taken 12/11/2023 1658) LTG: Pt will ambulate in controlled environ  assist needed:: Minimal Assistance -  Patient > 75% LTG: Ambulation distance in controlled environment: 138ft Goal: LTG Patient will ambulate in home environment (PT) Description: LTG: Patient will ambulate in home environment, # of feet with assistance (PT). Flowsheets (Taken 12/11/2023 1658) LTG: Pt will ambulate in home environ  assist needed:: Minimal Assistance - Patient > 75% LTG: Ambulation distance in home environment: 6ft   Problem: RH Stairs Goal: LTG Patient will ambulate up and down stairs w/assist (PT) Description: LTG: Patient will ambulate up and down # of stairs with assistance (PT) Flowsheets (Taken 12/11/2023 1658) LTG: Pt will ambulate up/down stairs assist needed:: Minimal Assistance - Patient > 75% LTG: Pt will  ambulate up and down number of stairs: 4 steps with LRAD   Problem: RH Ambulation Goal: LTG Patient will ambulate in community environment (PT) Description: LTG: Patient will ambulate in community environment, # of feet with assistance (PT). Outcome: Not Applicable

## 2023-12-11 NOTE — Progress Notes (Addendum)
 Occupational Therapy Session Note  Patient Details  Name: [name] [name] MRN: [id number] Date of Birth: 08/05/1940  Today's Date: 12/11/2023 OT Individual Time: 8896-8788 OT Individual Time Calculation (min): 68 min    Short Term Goals: Week 1:  OT Short Term Goal 1 (Week 1): Patient will complete UB bathing and dressing with  MinA. OT Short Term Goal 2 (Week 1): The pt will complete LB bathing and dressing with MinA  incorporating AE as needed. OT Short Term Goal 3 (Week 1): The pt will improve UB strength to 4/5 MMT OT Short Term Goal 4 (Week 1): The pt will replicate a pattern after verbal instruction and a demonstration 4 of 5 occurrences. OT Short Term Goal 5 (Week 1): The pt will complete sit to stands with MinA incorporating with the RW with good anatomical positioning.  Skilled Therapeutic Interventions/Progress Updates:    Patient received as direct handoff from nursing staff.  Nurse taking patient to the bathroom.  Patient's brief soaked, but successfully voided on toilet.  Patient able to complete hygiene when given time - needed cueing that she held toilet tissue in each hand - seemingly unaware of tissue in left hand.  Patient with improved attention within a task this session.  If guided from one task to another able to effectively participate and shift to new part of task; e.g. if handed deodorant, she opened and applied, then if handed shirt she switched to donning shirt.  Less perseveration noted.  (Patient is being treated with IVF, and was diagnosed with UTI. She has also started on amantadine .) Patient transitions slowly from sit to stand and needs UE support for this transition.  Needs increased time to stabilize herself once standing to then release UE's from support to pull up clothing.  Patient with more apparent left sided inattention and weakness today as evidenced by difficulty placing left foot into depends, pants, and lack of awareness of items in left hand.  Patient  continues with stimulus bound behavior, but much less problematic this session.  Patient left up in wheelchair at end of session with safety belt in place and engaged and call bell/ personal items in reach.  Reviewed call bell functioning again with patient.     Therapy Documentation Precautions:  Precautions Precautions: Fall Recall of Precautions/Restrictions: Impaired Restrictions Weight Bearing Restrictions Per Provider Order: No   Pain:  Denies pain     Therapy/Group: Individual Therapy  Nishaan Stanke M 12/11/2023, 12:39 PM

## 2023-12-11 NOTE — Progress Notes (Signed)
 Patient ID: [name] [name], female   DOB: 03-01-1941, 83 y.o.   MRN: [id number]  Have reviewed team conference with pt and family. Both aware and agreeable with targeted d/c date of 8/26 and goals of Supervision/Verbal cueing .

## 2023-12-11 NOTE — Progress Notes (Signed)
 Speech Language Pathology Daily Session Note  Patient Details  Name: [name] [name] MRN: [id number] Date of Birth: Feb 08, 1941  Today's Date: 12/11/2023 SLP Individual Time: 1001-1055 SLP Individual Time Calculation (min): 54 min  Short Term Goals: Week 1: SLP Short Term Goal 1 (Week 1): Patient will utilize external memory aids to recall daily information with 80% accuracy given mod assist. SLP Short Term Goal 2 (Week 1): Patient will complete basic functional problem solving tasks with 80% accuracy given mod multimodal cues. SLP Short Term Goal 3 (Week 1): Patient will maintain topic during conversation re: biographical information with mod cues from conversation partner. SLP Short Term Goal 4 (Week 1): Patient will orient to year and month using external aids in 4/5 opportunities given mod assist.  Skilled Therapeutic Interventions: SLP conducted skilled therapy session targeting cognitive goals. SLP conducted problem solving tasks where pt had to identify the problem and come up with the solution. The patient required mod assist during the task. Throughout task, patient experienced mildly halting speech with difficulty word finding throughout. During generative naming task, the patient required max assist with need for assistance increased as environmental distracters increased. During the animal category, patient became fixated on cats and required numerous cues to redirect. During task, patient required mod to max assist for sustained attention and max assist for recall after 30 second delay. Patient was left in room with call bell in reach and alarm set. SLP will continue to target goals per plan of care.    Pain  None  Therapy/Group: Individual Therapy  Ashley A Ellin 12/11/2023, 12:31 PM

## 2023-12-11 NOTE — Progress Notes (Signed)
 Physical Therapy Session Note  Patient Details  Name: [name] [name] MRN: [id number] Date of Birth: 03/17/1941  Today's Date: 12/11/2023 PT Individual Time: 9084-9058 + 1515-1550 PT Individual Time Calculation (min): 26 min  + 40 min  Short Term Goals: Week 1:  PT Short Term Goal 1 (Week 1): Pt will increase bed mobility to min A. PT Short Term Goal 2 (Week 1): Pt will increase transfers to min A. PT Short Term Goal 3 (Week 1): Pt will ambulate 50 feet with LRAD and min A PT Short Term Goal 4 (Week 1): Pt will ascend/descend 4 stairs with B rails and min A PT Short Term Goal 5 (Week 1): Pt will increase standing balance to min A  Skilled Therapeutic Interventions/Progress Updates:      1st session: Pt presents in bed - finishing up her breakfast, ate 100% of her meal. Pt denies any pain, continues to have general confusion, delayed processing, and poor initiation during session.   Supine<>sitting EOB needing minA for initiation and for completing task in timely manner. Completed stand pivot transfer with minA for similar reasons as above.   Transported to the day room gym and completed table top activity in standing with CGA/SBA for balance with BUE support on table. Patient with notable posterior bias, weight on her heels, and has difficulty correcting and initiating forward response. She completed activities in standing to challenge standing tolerance, simple problem solving, and selective attention in gym environment. Pt needing max cues overall for simple tasks.   Pt returned to her room, was left sitting up in w/c with seat belt alarm donned, call bell within reach.     2nd session: Pt presents sitting up in wheelchair with her brother present at the bedside. Pt has no complaints of pain, agreeable to PT tx. Brother asking for volunteer services to help him back to front entrance to leave - charge RN notified of request.   Patient transported to main gym at w/c level.   Pt  completed sit<>stand with no AD or UE support with minA to assist in managing her poseterior lean and providing confidence. She ambulated 22ft with minA and no AD/UE support with short shuffling steps, posterior leaning, and highly distracted from her own thoughts. Gait distance limited by generalized fatigue.   Pt instructed in standing reaching tasks to work on improving balance and reaching outside her BOS. Pt needing minA for balance and ++ time for processing and initiating.   Pt returned to her room and she was assisted back to bed with minA stand step transfer, patient needing mod cues for general sequencing, has difficulty processing multi-step commands. She was assisted into bed and alarm was set. Needs met. LPN entering room upon exit.     Therapy Documentation Precautions:  Precautions Precautions: Fall Recall of Precautions/Restrictions: Impaired Restrictions Weight Bearing Restrictions Per Provider Order: No General:     Therapy/Group: Individual Therapy  Sherlean SHAUNNA Perks 12/11/2023, 7:43 AM

## 2023-12-11 NOTE — Progress Notes (Signed)
 PROGRESS NOTE   Subjective/Complaints: Patient is feeling better today, discussed that she was dehydrated and with UTI and that fluids and antibiotics were started for this  ROS: Patient denies fever, rash, sore throat, blurred vision, dizziness, nausea, vomiting, diarrhea, cough, shortness of breath or chest pain, joint or back/neck pain, headache, or mood change.    Objective:   No results found. Recent Labs    12/10/23 1552  WBC 6.2  HGB 14.3  HCT 45.1  PLT 222   Recent Labs    12/10/23 1552 12/11/23 0602  NA 139 143  K 4.6 3.9  CL 103 107  CO2 24 25  GLUCOSE 111* 78  BUN 28* 27*  CREATININE 1.43* 1.09*  CALCIUM  9.6 9.3    Intake/Output Summary (Last 24 hours) at 12/11/2023 1046 Last data filed at 12/11/2023 0949 Gross per 24 hour  Intake 851.67 ml  Output 350 ml  Net 501.67 ml        Physical Exam: Vital Signs Blood pressure (!) 152/76, pulse 86, temperature 98.1 F (36.7 C), resp. rate 19, height 5' 3 (1.6 m), weight 54.5 kg, SpO2 95%. Constitutional: No distress . Vital signs reviewed. HEENT: NCAT, EOMI, oral membranes moist Neck: supple Cardiovascular: RRR without murmur. No JVD    Respiratory/Chest: CTA Bilaterally without wheezes or rales. Normal effort    GI/Abdomen: BS +, non-tender, non-distended Ext: no clubbing, cyanosis, hands well perfused but cool. Psych: pleasant and cooperative  Skin: intact Musculoskeletal:        General: No swelling or tenderness. Normal range of motion.     Cervical back: Normal range of motion.  Skin:    General: Skin is warm and dry.     Comments: Bilateral TKA scars  Neurological:     Mental Status: She is alert.     Comments: Pt is alert, oriented to person, hospital, date with extra time. Word finding deficits still. Processing can be delayed. Fair awareness and insight.  CN exam non-focal. Speech generally clear. MMT: RUE 4+ to 5/5 prox to distal. LLE  4 to 4+/5 prox to distal. RLE 4/5 HF 4+ KE,  5/5 ADF/PF. LLE 3- to 3/5 HF, 3/5 KE and 3-4/5 ADF/PF.--no change 8/18.  Sensory exam normal for light touch and pain in all 4 limbs. No limb ataxia or cerebellar signs. No abnormal tone appreciated.  DTR's 1+. Stable 8/21   Assessment/Plan: 1. Functional deficits which require 3+ hours per day of interdisciplinary therapy in a comprehensive inpatient rehab setting. Physiatrist is providing close team supervision and 24 hour management of active medical problems listed below. Physiatrist and rehab team continue to assess barriers to discharge/monitor patient progress toward functional and medical goals  Care Tool:  Bathing    Body parts bathed by patient: Right arm, Left arm, Abdomen, Right upper leg, Left upper leg, Face, Chest, Buttocks, Front perineal area, Right lower leg, Left lower leg   Body parts bathed by helper: Buttocks, Front perineal area, Right lower leg, Left lower leg, Left upper leg, Right upper leg Body parts n/a: Right lower leg, Left lower leg   Bathing assist Assist Level: Contact Guard/Touching assist     Upper Body  Dressing/Undressing Upper body dressing   What is the patient wearing?: Bra, Pull over shirt    Upper body assist Assist Level: Moderate Assistance - Patient 50 - 74%    Lower Body Dressing/Undressing Lower body dressing      What is the patient wearing?: Incontinence brief, Pants     Lower body assist Assist for lower body dressing: Moderate Assistance - Patient 50 - 74%     Toileting Toileting    Toileting assist Assist for toileting: Moderate Assistance - Patient 50 - 74%     Transfers Chair/bed transfer  Transfers assist     Chair/bed transfer assist level: Minimal Assistance - Patient > 75%     Locomotion Ambulation   Ambulation assist   Ambulation activity did not occur: Safety/medical concerns  Assist level: Contact Guard/Touching assist Assistive device:  Walker-rolling Max distance: 180'   Walk 10 feet activity   Assist  Walk 10 feet activity did not occur: Safety/medical concerns  Assist level: Contact Guard/Touching assist Assistive device: Walker-rolling   Walk 50 feet activity   Assist Walk 50 feet with 2 turns activity did not occur: Safety/medical concerns  Assist level: Minimal Assistance - Patient > 75% Assistive device: Walker-rolling    Walk 150 feet activity   Assist Walk 150 feet activity did not occur: Safety/medical concerns  Assist level: Supervision/Verbal cueing Assistive device: Walker-rolling    Walk 10 feet on uneven surface  activity   Assist Walk 10 feet on uneven surfaces activity did not occur: Safety/medical concerns         Wheelchair     Assist Is the patient using a wheelchair?: Yes Type of Wheelchair: Manual    Wheelchair assist level: Dependent - Patient 0% Max wheelchair distance: 175'    Wheelchair 50 feet with 2 turns activity    Assist        Assist Level: Dependent - Patient 0%   Wheelchair 150 feet activity     Assist      Assist Level: Dependent - Patient 0%   Blood pressure (!) 152/76, pulse 86, temperature 98.1 F (36.7 C), resp. rate 19, height 5' 3 (1.6 m), weight 54.5 kg, SpO2 95%.  Medical Problem List and Plan: 1. 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  Antithrombotics: -DVT/anticoagulation:  Pharmaceutical: continue Heparin , SCDs ordered             -antiplatelet therapy: N/A  3. Pain Management: Tylenol  as needed  4. Mood/Behavior/Sleep:  continue Cymbalta  60 mg daily, Namenda  10 mg twice daily             -antipsychotic agents: N/A  5. Neuropsych/cognition: This patient is not capable of making decisions on her own behalf.  6. Constipation: last BM 8/19, decrease regimen to senna 2 tabs HS, mag gluconate 500mg  daily added, d/c colace, messaged nursing to confirm date of last BM   7. Fluids/Electrolytes/Nutrition: Routine in and outs with follow-up chemistries on Monday  8.  Hypertension.  Continue Norvasc  10 mg daily, Toprol -XL 12.5 mg  daily.  Monitor with increased mobility, magnesium  supplement started- continue 500mg   9.  Hyperlipidemia continue Crestor   10. Likely peripheral arterial disease: would benefit from outpatient vascular follow-up, may benefit from outpatient Qutenza, d/c teds as worsens symptoms of cold feet  11. Fatigue: D3 increased to 2,000U daily, continue  12. AKI: asked SLP to work with her on a daily water  chart, IVF started  41. UTI: keflex  started, f/u [facility]  LOS: 6 days A FACE TO FACE EVALUATION WAS PERFORMED  Sven SQUIBB Jocee Kissick 12/11/2023, 10:46 AM

## 2023-12-11 NOTE — Plan of Care (Signed)
  Problem: Consults Goal: RH STROKE PATIENT EDUCATION Description: See Patient Education module for education specifics  Outcome: Progressing   Problem: RH BOWEL ELIMINATION Goal: RH STG MANAGE BOWEL WITH ASSISTANCE Description: STG Manage Bowel with toileting Assistance. Outcome: Progressing Goal: RH STG MANAGE BOWEL W/MEDICATION W/ASSISTANCE Description: STG Manage Bowel with Medication with mod I  Assistance. Outcome: Progressing   Problem: RH BLADDER ELIMINATION Goal: RH STG MANAGE BLADDER WITH ASSISTANCE Description: STG Manage Bladder With toileting Assistance Outcome: Progressing   Problem: RH SAFETY Goal: RH STG ADHERE TO SAFETY PRECAUTIONS W/ASSISTANCE/DEVICE Description: STG Adhere to Safety Precautions With cues Assistance/Device. Outcome: Progressing   Problem: RH PAIN MANAGEMENT Goal: RH STG PAIN MANAGED AT OR BELOW PT'S PAIN GOAL Description: Pain < 4 with prns Outcome: Progressing   Problem: RH KNOWLEDGE DEFICIT Goal: RH STG INCREASE KNOWLEDGE OF HYPERTENSION Description: Patient and dtr will be able to manage HTN using educational resources for medications and dietary modification independently Outcome: Progressing Goal: RH STG INCREASE KNOWLEDGE OF DYSPHAGIA/FLUID INTAKE Description: Patient and dtr will be able to manage dysphagia using educational resources for medications and dietary modification independently Outcome: Progressing Goal: RH STG INCREASE KNOWLEGDE OF HYPERLIPIDEMIA Description: Patient and dtr will be able to manage HLD using educational resources for medications and dietary modification independently Outcome: Progressing Goal: RH STG INCREASE KNOWLEDGE OF STROKE PROPHYLAXIS Description: Patient and dtr will be able to manage secondary risks using educational resources for medications and dietary modification independently Outcome: Progressing

## 2023-12-12 LAB — URINE CULTURE: Culture: 50000 — AB

## 2023-12-12 LAB — BASIC METABOLIC PANEL WITH GFR
Anion gap: 7 (ref 5–15)
BUN: 25 mg/dL — ABNORMAL HIGH (ref 8–23)
CO2: 20 mmol/L — ABNORMAL LOW (ref 22–32)
Calcium: 9.1 mg/dL (ref 8.9–10.3)
Chloride: 112 mmol/L — ABNORMAL HIGH (ref 98–111)
Creatinine, Ser: 1.02 mg/dL — ABNORMAL HIGH (ref 0.44–1.00)
GFR, Estimated: 55 mL/min — ABNORMAL LOW (ref >60)
Glucose, Bld: 81 mg/dL (ref 70–99)
Potassium: 4.7 mmol/L (ref 3.5–5.1)
Sodium: 139 mmol/L (ref 135–145)

## 2023-12-12 NOTE — Progress Notes (Signed)
 Occupational Therapy Discharge Summary  Patient Details  Name: [name] [name] MRN: [id number] Date of Birth: October 31, 1940  Date of Discharge from OT service:{Time; dates multiple:304500300}  {CHL IP REHAB OT TIME CALCULATIONS:304400400}   Patient has met 13 of 13 long term goals due to {due un:6958348}.  Patient to discharge at overall {LOA:3049010} level.  Patient's care partner {care partner:3041650} to provide the necessary {assistance:3041652} assistance at discharge.    Reasons goals not met: NA  Recommendation:  Patient will benefit from ongoing skilled OT services in {setting:3041680} to continue to advance functional skills in the area of {ADL/iADL:3041649}.  Equipment: TTB( family to purchase)  Reasons for discharge: {Reason for discharge:3049018}  Patient/family agrees with progress made and goals achieved: {Pt/Family agree with progress/goals:3049020}  OT Discharge Precautions/Restrictions  Precautions Precautions: Fall Restrictions Weight Bearing Restrictions Per Provider Order: No General   Vital Signs   Pain Pain Assessment Pain Score: 0-No pain ADL ADL Equipment Provided: Reacher Eating: Minimal assistance Where Assessed-Eating: Chair Grooming: Supervision/safety Where Assessed-Grooming: Sitting at sink Upper Body Bathing: Supervision/safety Where Assessed-Upper Body Bathing: Shower, Sitting at sink Lower Body Bathing: Minimal assistance Where Assessed-Lower Body Bathing: Sitting at sink Upper Body Dressing: Minimal assistance Where Assessed-Upper Body Dressing: Sitting at sink Lower Body Dressing: Minimal assistance Where Assessed-Lower Body Dressing: Sitting at sink, Standing at sink Toileting: Minimal assistance Where Assessed-Toileting: Teacher, adult education: Curator Method: Ambulating, Stand pivot Toilet Transfer Equipment: Grab bars, Bedside commode Tub/Shower Transfer: Moderate assistance Tub/Shower Transfer  Method: Ambulating (RW and grab bars.) Tub/Shower Equipment: Information systems manager with back, Acupuncturist: Minimal assistance Film/video editor Method: Ambulating, Stand pivot Astronomer: Transfer tub bench ADL Comments: arm rails on the seat, 3 in 1 seating device Vision Baseline Vision/History: 1 Wears glasses Patient Visual Report: No change from baseline Vision Assessment?: Vision impaired- to be further tested in functional context Perception  Perception: Impaired Perception-Other Comments: left inattention Praxis Praxis: Impaired Praxis Impairment Details: Initiation;Motor planning Cognition Cognition Overall Cognitive Status: History of cognitive impairments - at baseline Arousal/Alertness: Awake/alert Memory: Impaired Memory Impairment: Decreased recall of new information;Decreased short term memory Decreased Short Term Memory: Functional basic Attention: Sustained Sustained Attention: Impaired Sustained Attention Impairment: Functional basic Awareness: Impaired Awareness Impairment: Intellectual impairment Problem Solving: Impaired Problem Solving Impairment: Functional basic Executive Function: Initiating;Organizing;Self Monitoring;Self Correcting Reasoning: Impaired Reasoning Impairment: Functional basic Sequencing: Impaired Sequencing Impairment: Functional basic Organizing: Impaired Organizing Impairment: Functional basic Initiating: Impaired Initiating Impairment: Functional basic Self Monitoring: Impaired Self Monitoring Impairment: Functional basic Self Correcting: Impaired Self Correcting Impairment: Functional basic Behaviors: Perseveration Safety/Judgment: Impaired Brief Interview for Mental Status (BIMS) Repetition of Three Words (First Attempt): 3 Temporal Orientation: Year: No answer Temporal Orientation: Month: Accurate within 5 days (with 2 choices provided) Temporal Orientation: Day: Correct Recall: Sock: No,  could not recall Recall: Blue: Yes, after cueing (a color) Recall: Bed: No, could not recall BIMS Summary Score: 7 Sensation Sensation Light Touch: Impaired by gross assessment Hot/Cold: Appears Intact Proprioception: Impaired by gross assessment Stereognosis: Impaired by gross assessment Coordination Gross Motor Movements are Fluid and Coordinated: No Fine Motor Movements are Fluid and Coordinated: No Motor  Motor Motor: Hemiplegia;Motor impersistence Motor - Discharge Observations: Left weakness inattention Mobility  Bed Mobility Bed Mobility: Rolling Right;Rolling Left;Sit to Supine;Supine to Sit Rolling Right: Minimal Assistance - Patient > 75% Rolling Left: Minimal Assistance - Patient > 75% Supine to Sit: Minimal Assistance - Patient > 75% Sit to Supine: Minimal Assistance - Patient >  75% Transfers Sit to Stand: Minimal Assistance - Patient > 75% Stand to Sit: Minimal Assistance - Patient > 75%  Trunk/Postural Assessment  Postural Control Postural Control: Deficits on evaluation Righting Reactions: posterior bias  Balance Balance Balance Assessed: Yes Static Sitting Balance Static Sitting - Level of Assistance: 7: Independent Static Standing Balance Static Standing - Balance Support: During functional activity Static Standing - Level of Assistance: 4: Min assist Dynamic Standing Balance Dynamic Standing - Balance Support: During functional activity Dynamic Standing - Level of Assistance: 4: Min assist Extremity/Trunk Assessment RUE Assessment RUE Assessment: Within Functional Limits LUE Assessment LUE Assessment: Exceptions to [facility] Active Range of Motion (AROM) Comments: Has full movement, but limited functional use noted   Gellert, Kristin M 12/12/2023, 1:11 PM

## 2023-12-12 NOTE — Progress Notes (Signed)
 Physical Therapy Session Note  Patient Details  Name: [name] [name] MRN: [id number] Date of Birth: 1941-03-24  Today's Date: 12/12/2023 PT Individual Time: 1300-1420 PT Individual Time Calculation (min): 80 min   Short Term Goals: Week 1:  PT Short Term Goal 1 (Week 1): Pt will increase bed mobility to min A. PT Short Term Goal 2 (Week 1): Pt will increase transfers to min A. PT Short Term Goal 3 (Week 1): Pt will ambulate 50 feet with LRAD and min A PT Short Term Goal 4 (Week 1): Pt will ascend/descend 4 stairs with B rails and min A PT Short Term Goal 5 (Week 1): Pt will increase standing balance to min A  Skilled Therapeutic Interventions/Progress Updates:      Pt sitting in wheelchair with her daughter, [name] [name], present at the bedside. Session focused on family education and training, preparing for DC for Tuesday 8/26. Discussed at length DC planning, DME rec's, follow up therapy recommendations, fall prevention, home safety, caregiver education, primary deficits related to CVA, barriers to progression including baseline Dementia, etc.   Patient taken to the main gym at wheelchair level. Sit<>stand to RW with CGA with ++ time for processing and initiation. Patient needing cues for hand placement to push from armrests. Ambulates with CGA and RW 170ft - slow shuffling steps, veers to the L, and keeps her body outside RW frame. Cues throughout for increasing speed, stride length, and keeping proximity to RW. Ed to daughter on cues and fall prevention tips.   Completed curb transfer with curb height set to 4. Completed via HHA to simulate home entrance. Had daughter complete as well and patient completed with minA of 1 person. Completed x4 reps for thoroughness and understanding.  Reviewed car transfer with car height simulated to replicate their mid-size SUV. Pt completed car transfer at minA level with cues for sequencing and safe approach. Ed to daughter on cues, guarding, positioning, and  safety precautions.  Pt returned to her room and she was left sitting up in w/c, needs met ,daughter at bedside. All questions/concerns addressed.   Therapy Documentation Precautions:  Precautions Precautions: Fall Recall of Precautions/Restrictions: Impaired Restrictions Weight Bearing Restrictions Per Provider Order: No General:     Therapy/Group: Individual Therapy  [name] [name] 12/12/2023, 3:03 PM

## 2023-12-12 NOTE — Plan of Care (Signed)
 Goals downgraded due to slower than expected progress and prior cognitive deficits. Problem: RH Balance Goal: LTG: Patient will maintain dynamic sitting balance (OT) Description: LTG:  Patient will maintain dynamic sitting balance with assistance during activities of daily living (OT) Flowsheets (Taken 12/12/2023 0743) LTG: Pt will maintain dynamic sitting balance during ADLs with: Supervision/Verbal cueing Goal: LTG Patient will maintain dynamic standing with ADLs (OT) Description: LTG:  Patient will maintain dynamic standing balance with assist during activities of daily living (OT)  Flowsheets (Taken 12/12/2023 0743) LTG: Pt will maintain dynamic standing balance during ADLs with: Contact Guard/Touching assist   Problem: Sit to Stand Goal: LTG:  Patient will perform sit to stand in prep for activites of daily living with assistance level (OT) Description: LTG:  Patient will perform sit to stand in prep for activites of daily living with assistance level (OT) Flowsheets (Taken 12/12/2023 0743) LTG: PT will perform sit to stand in prep for activites of daily living with assistance level: Contact Guard/Touching assist   Problem: RH Grooming Goal: LTG Patient will perform grooming w/assist,cues/equip (OT) Description: LTG: Patient will perform grooming with assist, with/without cues using equipment (OT) Flowsheets (Taken 12/12/2023 0743) LTG: Pt will perform grooming with assistance level of: Supervision/Verbal cueing   Problem: RH Bathing Goal: LTG Patient will bathe all body parts with assist levels (OT) Description: LTG: Patient will bathe all body parts with assist levels (OT) Flowsheets (Taken 12/12/2023 0743) LTG: Pt will perform bathing with assistance level/cueing: Contact Guard/Touching assist   Problem: RH Dressing Goal: LTG Patient will perform upper body dressing (OT) Description: LTG Patient will perform upper body dressing with assist, with/without cues (OT). Flowsheets (Taken  12/12/2023 [phone]) LTG: Pt will perform upper body dressing with assistance level of: Minimal Assistance - Patient > 75%   Problem: RH Functional Use of Upper Extremity Goal: LTG Patient will use RT/LT upper extremity as a (OT) Description: LTG: Patient will use right/left upper extremity as a stabilizer/gross assist/diminished/nondominant/dominant level with assist, with/without cues during functional activity (OT) Flowsheets (Taken 12/12/2023 0743) LTG: Use of upper extremity in functional activities: LUE as a stabilizer LTG: Pt will use upper extremity in functional activity with assistance level of: Supervision/Verbal cueing   Problem: RH Toilet Transfers Goal: LTG Patient will perform toilet transfers w/assist (OT) Description: LTG: Patient will perform toilet transfers with assist, with/without cues using equipment (OT) Flowsheets (Taken 12/12/2023 0743) LTG: Pt will perform toilet transfers with assistance level of: Contact Guard/Touching assist   Problem: RH Tub/Shower Transfers Goal: LTG Patient will perform tub/shower transfers w/assist (OT) Description: LTG: Patient will perform tub/shower transfers with assist, with/without cues using equipment (OT) Flowsheets (Taken 12/12/2023 0743) LTG: Pt will perform tub/shower stall transfers with assistance level of: Minimal Assistance - Patient > 75%   Problem: RH Memory Goal: LTG Patient will demonstrate ability for day to day recall/carry over during activities of daily living with assistance level (OT) Description: LTG:  Patient will demonstrate ability for day to day recall/carry over during activities of daily living with assistance level (OT). Flowsheets (Taken 12/12/2023 [phone]) LTG:  Patient will demonstrate ability for day to day recall/carry over during activities of daily living with assistance level (OT): Minimal Assistance - Patient > 75%   Problem: RH Attention Goal: LTG Patient will demonstrate this level of attention during  functional activites (OT) Description: LTG:  Patient will demonstrate this level of attention during functional activites  (OT) Flowsheets (Taken 12/12/2023 0743) Patient will demonstrate this level of attention during functional  activites: Sustained Patient will demonstrate above attention level in the following environment: Home LTG: Patient will demonstrate this level of attention during functional activites (OT): Minimal Assistance - Patient > 75%

## 2023-12-12 NOTE — Plan of Care (Signed)
  Problem: Consults Goal: RH STROKE PATIENT EDUCATION Description: See Patient Education module for education specifics  Outcome: Progressing   Problem: RH BOWEL ELIMINATION Goal: RH STG MANAGE BOWEL WITH ASSISTANCE Description: STG Manage Bowel with toileting Assistance. Outcome: Progressing Goal: RH STG MANAGE BOWEL W/MEDICATION W/ASSISTANCE Description: STG Manage Bowel with Medication with mod I  Assistance. Outcome: Progressing   Problem: RH BLADDER ELIMINATION Goal: RH STG MANAGE BLADDER WITH ASSISTANCE Description: STG Manage Bladder With toileting Assistance Outcome: Progressing   Problem: RH SAFETY Goal: RH STG ADHERE TO SAFETY PRECAUTIONS W/ASSISTANCE/DEVICE Description: STG Adhere to Safety Precautions With cues Assistance/Device. Outcome: Progressing   Problem: RH PAIN MANAGEMENT Goal: RH STG PAIN MANAGED AT OR BELOW PT'S PAIN GOAL Description: Pain < 4 with prns Outcome: Progressing   Problem: RH KNOWLEDGE DEFICIT Goal: RH STG INCREASE KNOWLEDGE OF HYPERTENSION Description: Patient and dtr will be able to manage HTN using educational resources for medications and dietary modification independently Outcome: Progressing Goal: RH STG INCREASE KNOWLEDGE OF DYSPHAGIA/FLUID INTAKE Description: Patient and dtr will be able to manage dysphagia using educational resources for medications and dietary modification independently Outcome: Progressing Goal: RH STG INCREASE KNOWLEGDE OF HYPERLIPIDEMIA Description: Patient and dtr will be able to manage HLD using educational resources for medications and dietary modification independently Outcome: Progressing Goal: RH STG INCREASE KNOWLEDGE OF STROKE PROPHYLAXIS Description: Patient and dtr will be able to manage secondary risks using educational resources for medications and dietary modification independently Outcome: Progressing

## 2023-12-12 NOTE — Progress Notes (Signed)
 Speech Language Pathology Daily Session Note  Patient Details  Name: [name] [name] MRN: [id number] Date of Birth: 12-Apr-1941  Today's Date: 12/12/2023 SLP Individual Time: 1007-1102 SLP Individual Time Calculation (min): 55 min  Short Term Goals: Week 1: SLP Short Term Goal 1 (Week 1): Patient will utilize external memory aids to recall daily information with 80% accuracy given mod assist. SLP Short Term Goal 2 (Week 1): Patient will complete basic functional problem solving tasks with 80% accuracy given mod multimodal cues. SLP Short Term Goal 3 (Week 1): Patient will maintain topic during conversation re: biographical information with mod cues from conversation partner. SLP Short Term Goal 4 (Week 1): Patient will orient to year and month using external aids in 4/5 opportunities given mod assist.  Skilled Therapeutic Interventions: SLP conducted skilled therapy session targeting cognitive goals. Patient's daughter was present for family education and very receptive to all information provided re: therapy targets. SLP and family collaboratively discussed current home setup, plan for 24/7 supervision, and installation of strategies around the home to make the environment more accessible cognitively. Patient's family already endorses utilizing many useful strategies and were grateful for further suggestions. Discussed and demonstrated the use of spaced retrieval training for memory and provided handout/resources for carryover. Patient was left in room with call bell in reach and alarm set. SLP will continue to target goals per plan of care.        Pain Pain Assessment Pain Scale: 0-10 Pain Score: 0-No pain  Therapy/Group: Individual Therapy  Maisen Klingler, M.A., CCC-SLP  Dantae Meunier A Kaylynne Andres 12/12/2023, 12:09 PM

## 2023-12-12 NOTE — Progress Notes (Signed)
 Patient ID: [name] [name], female   DOB: [birth year], 83 y.o.   MRN: [id number]  DME 3-1 commode ordered per request

## 2023-12-12 NOTE — Progress Notes (Signed)
 PROGRESS NOTE   Subjective/Complaints: No new complaints this morning Discussed that she has E coli in her urine and this is sensitive to the Keflex  that she is on  ROS: Patient denies fever, rash, sore throat, blurred vision, dizziness, nausea, vomiting, diarrhea, cough, shortness of breath or chest pain, joint or back/neck pain, headache, or mood change.    Objective:   No results found. Recent Labs    12/10/23 1552  WBC 6.2  HGB 14.3  HCT 45.1  PLT 222   Recent Labs    12/11/23 0602 12/12/23 0629  NA 143 139  K 3.9 4.7  CL 107 112*  CO2 25 20*  GLUCOSE 78 81  BUN 27* 25*  CREATININE 1.09* 1.02*  CALCIUM  9.3 9.1    Intake/Output Summary (Last 24 hours) at 12/12/2023 1109 Last data filed at 12/12/2023 0844 Gross per 24 hour  Intake 1686.83 ml  Output --  Net 1686.83 ml        Physical Exam: Vital Signs Blood pressure (!) 158/74, pulse 67, temperature 97.8 F (36.6 C), resp. rate 17, height 5' 3 (1.6 m), weight 54.5 kg, SpO2 100%. Constitutional: No distress . Vital signs reviewed. HEENT: NCAT, EOMI, oral membranes moist Neck: supple Cardiovascular: RRR without murmur. No JVD    Respiratory/Chest: CTA Bilaterally without wheezes or rales. Normal effort    GI/Abdomen: BS +, non-tender, non-distended Ext: no clubbing, cyanosis, hands well perfused but cool. Psych: pleasant and cooperative  Skin: intact Musculoskeletal:        General: No swelling or tenderness. Normal range of motion.     Cervical back: Normal range of motion.  Skin:    General: Skin is warm and dry.     Comments: Bilateral TKA scars  Neurological:     Mental Status: She is alert.     Comments: Pt is alert, oriented to person, hospital, date with extra time. Word finding deficits still. Processing can be delayed. Fair awareness and insight.  CN exam non-focal. Speech generally clear. MMT: RUE 4+ to 5/5 prox to distal. LLE 4 to 4+/5  prox to distal. RLE 4/5 HF 4+ KE,  5/5 ADF/PF. LLE 3- to 3/5 HF, 3/5 KE and 3-4/5 ADF/PF.--no change 8/18.  Sensory exam normal for light touch and pain in all 4 limbs. No limb ataxia or cerebellar signs. No abnormal tone appreciated.  DTR's 1+. Stable 8/22   Assessment/Plan: 1. Functional deficits which require 3+ hours per day of interdisciplinary therapy in a comprehensive inpatient rehab setting. Physiatrist is providing close team supervision and 24 hour management of active medical problems listed below. Physiatrist and rehab team continue to assess barriers to discharge/monitor patient progress toward functional and medical goals  Care Tool:  Bathing    Body parts bathed by patient: Right arm, Left arm, Abdomen, Right upper leg, Left upper leg, Face, Chest, Buttocks, Front perineal area, Right lower leg, Left lower leg   Body parts bathed by helper: Buttocks, Front perineal area, Right lower leg, Left lower leg, Left upper leg, Right upper leg Body parts n/a: Right lower leg, Left lower leg   Bathing assist Assist Level: Contact Guard/Touching assist  Upper Body Dressing/Undressing Upper body dressing   What is the patient wearing?: Bra, Pull over shirt    Upper body assist Assist Level: Minimal Assistance - Patient > 75%    Lower Body Dressing/Undressing Lower body dressing      What is the patient wearing?: Underwear/pull up, Pants     Lower body assist Assist for lower body dressing: Minimal Assistance - Patient > 75%     Toileting Toileting    Toileting assist Assist for toileting: Moderate Assistance - Patient 50 - 74%     Transfers Chair/bed transfer  Transfers assist     Chair/bed transfer assist level: Minimal Assistance - Patient > 75%     Locomotion Ambulation   Ambulation assist   Ambulation activity did not occur: Safety/medical concerns  Assist level: Contact Guard/Touching assist Assistive device: Walker-rolling Max distance: 180'    Walk 10 feet activity   Assist  Walk 10 feet activity did not occur: Safety/medical concerns  Assist level: Contact Guard/Touching assist Assistive device: Walker-rolling   Walk 50 feet activity   Assist Walk 50 feet with 2 turns activity did not occur: Safety/medical concerns  Assist level: Contact Guard/Touching assist Assistive device: Walker-rolling    Walk 150 feet activity   Assist Walk 150 feet activity did not occur: Safety/medical concerns  Assist level: Contact Guard/Touching assist Assistive device: Walker-rolling    Walk 10 feet on uneven surface  activity   Assist Walk 10 feet on uneven surfaces activity did not occur: Safety/medical concerns         Wheelchair     Assist Is the patient using a wheelchair?: Yes Type of Wheelchair: Manual    Wheelchair assist level: Dependent - Patient 0% Max wheelchair distance: 175'    Wheelchair 50 feet with 2 turns activity    Assist        Assist Level: Dependent - Patient 0%   Wheelchair 150 feet activity     Assist      Assist Level: Dependent - Patient 0%   Blood pressure (!) 158/74, pulse 67, temperature 97.8 F (36.6 C), resp. rate 17, height 5' 3 (1.6 m), weight 54.5 kg, SpO2 100%.  Medical Problem List and Plan: 1. 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  Antithrombotics: -DVT/anticoagulation:  Pharmaceutical: continue Heparin , SCDs ordered             -antiplatelet therapy: N/A  3. Pain Management: Tylenol  as needed  4. Mood/Behavior/Sleep: continue Cymbalta  60 mg daily,  Namenda  10 mg twice daily             -antipsychotic agents: N/A  5. Neuropsych/cognition: This patient is not capable of making decisions on her own behalf.  6. Constipation: last BM 8/19, decrease regimen to senna 2 tabs HS, mag gluconate 500mg  daily added, d/c colace, messaged nursing to confirm date of last BM   7. Fluids/Electrolytes/Nutrition: Routine in and outs with follow-up chemistries on Monday  8.  Hypertension.  Continue Norvasc  10 mg daily, Toprol -XL 12.5 mg daily.  Monitor with increased mobility, magnesium  supplement started- continue 500mg   9.  Hyperlipidemia continue Crestor   10. Likely peripheral arterial disease: would benefit from outpatient vascular follow-up, may benefit from outpatient Qutenza, d/c teds as worsens symptoms of cold feet  11. Fatigue: D3 increased to 2,000U daily, continue  12. AKI: asked SLP to work with her on a daily water  chart, discussed that creatinine has improved, will d/c IVF  13. UTI: keflex  started, UC shows E coli sensitive to Keflex   14. Impaired initiation: amantadine  100mg  started daily  LOS: 7 days A FACE TO FACE EVALUATION WAS PERFORMED  Caliyah Sieh P Braelyn Jenson 12/12/2023, 11:09 AM

## 2023-12-12 NOTE — Progress Notes (Signed)
 Occupational Therapy Weekly Progress Note  Patient Details  Name: [name] [name] MRN: [id number] Date of Birth: 02-Jun-1940  Beginning of progress report period: December 05, 2023 End of progress report period: December 12, 2023  Today's Date: 12/12/2023 OT Individual Time: 1103-1200 OT Individual Time Calculation (min): 57 min    Patient has met 2 of 2 short term goals.  Patient with improved transition sit to stand and stand balance.    Patient continues to demonstrate the following deficits: muscle weakness and muscle joint tightness, decreased coordination and decreased motor planning, decreased visual perceptual skills, decreased attention to left, decreased initiation, decreased attention, decreased awareness, decreased problem solving, decreased safety awareness, decreased memory, and delayed processing, and decreased sitting balance, decreased standing balance, decreased postural control, hemiplegia, and decreased balance strategies and therefore will continue to benefit from skilled OT intervention to enhance overall performance with BADL and Reduce care partner burden.  Patient not progressing toward long term goals.  See goal revision..  Plan of care revisions: Revised plan of care to account for prior cognitive deficits, and slower than anticipated progress.  OT Short Term Goals Week 1:  OT Short Term Goal 1 (Week 1): Patient will complete UB bathing and dressing with  MinA. OT Short Term Goal 1 - Progress (Week 1): Progressing toward goal OT Short Term Goal 2 (Week 1): The pt will complete LB bathing and dressing with MinA  incorporating AE as needed. OT Short Term Goal 2 - Progress (Week 1): Met OT Short Term Goal 3 (Week 1): The pt will improve UB strength to 4/5 MMT OT Short Term Goal 3 - Progress (Week 1): Discontinued (comment) OT Short Term Goal 4 (Week 1): The pt will replicate a pattern after verbal instruction and a demonstration 4 of 5 occurrences. OT Short Term Goal 4 -  Progress (Week 1): Discontinued (comment) OT Short Term Goal 5 (Week 1): The pt will complete sit to stands with MinA incorporating with the RW with good anatomical positioning. OT Short Term Goal 5 - Progress (Week 1): Met Week 2:  OT Short Term Goal 1 (Week 2): Goals downgraded to reflect prior dementia diagnosis as well as slower than anticipated progress.  STG = new LTG due to los  Skilled Therapeutic Interventions/Progress Updates:   Patient's daughter [name] [name] here for family education.  Patient received seated in wheelchair with IV actively running.  Discussed with patient and daughter needs for discharge.  Patient needs supervision and physical assistance for all mobility and ADL tasks, but does not need around the clock supervision.  Patient has not shown any unsafe behaviors - yet when cued is willing to move - then will need assistance.   Problem solved through equipment needs for bathing and toileting.  Daughter has both tub/shower and shower stall and daughter feels tub/shower will be safer.  Recommended tub transfer bench - family will get on their own.  Patient's daughter provided great insight into prior level of functioning.  Education covered the importance of allowing patient to move at her own pace yet cueing her to stay on task, as diminished initiation and stimulus bound behavior can limit her focus.  Daughter expressed appreciation of education and discharge planning.  Patient left up in chair with daughter at bedside.    Therapy Documentation Precautions:  Precautions Precautions: Fall Recall of Precautions/Restrictions: Impaired Restrictions Weight Bearing Restrictions Per Provider Order: No General:   Vital Signs:   Pain: Pain Assessment Pain Scale: 0-10 Pain Score: 0-No pain  ADL: ADL Equipment Provided: Reacher Eating: Set up (s/u A to MinA) Where Assessed-Eating:  (based on functional task involving reach) Grooming: Setup (s/u A to MinA) Where Assessed-Grooming:  Sitting at sink Upper Body Bathing: Minimal assistance Where Assessed-Upper Body Bathing: Shower Lower Body Bathing: Minimal assistance, Moderate assistance Where Assessed-Lower Body Bathing: Shower Upper Body Dressing: Minimal assistance Where Assessed-Upper Body Dressing: Other (Comment) (Bathroom) Lower Body Dressing: Moderate assistance, Maximal assistance Where Assessed-Lower Body Dressing:  (bathroom) Toileting: Maximal assistance Where Assessed-Toileting: Teacher, adult education: Moderate assistance (heavy posterior lean) Toilet Transfer Method: Ambulating (using the RW and grab bars  for transferring with heavy posterior lean) Toilet Transfer Equipment: Grab bars Tub/Shower Transfer: Moderate assistance Tub/Shower Transfer Method: Ambulating (RW and grab bars.) Tub/Shower Equipment: Shower seat with back, Acupuncturist: Moderate assistance Film/video editor Method: Manufacturing systems engineer with back ADL Comments: arm rails on the seat, 3 in 1 seating device Vision   Perception    Praxis   Exercises:   Other Treatments:     Therapy/Group: Individual Therapy  [name] [name] 12/12/2023, 12:12 PM

## 2023-12-13 MED ORDER — POLYETHYLENE GLYCOL 3350 17 G PO PACK
17.0000 g | PACK | Freq: Every day | ORAL | Status: DC
  Administered 2023-12-13 – 2023-12-16 (×4): 17 g via ORAL
  Filled 2023-12-13 (×3): qty 1

## 2023-12-13 NOTE — Progress Notes (Addendum)
 PROGRESS NOTE   Subjective/Complaints: No events overnight. No complaints. LBM several days ago Vitals stable     12/13/2023    4:22 AM 12/12/2023    7:58 PM 12/12/2023    6:00 PM  Vitals with BMI  Systolic 125 124 884  Diastolic 72 76 53  Pulse 66 83 74    No results for input(s): GLUCAP in the last 72 hours.   P.o. intakes appropriate  Incontinent of bladder  - feels urge to go, no dysuria, continent at baseline Last BM 8/19   ROS: Patient denies fever, rash, sore throat, blurred vision, dizziness, nausea, vomiting, diarrhea, cough, shortness of breath or chest pain, joint or back/neck pain, headache, or mood change.    Objective:   No results found. Recent Labs    12/10/23 1552  WBC 6.2  HGB 14.3  HCT 45.1  PLT 222   Recent Labs    12/11/23 0602 12/12/23 0629  NA 143 139  K 3.9 4.7  CL 107 112*  CO2 25 20*  GLUCOSE 78 81  BUN 27* 25*  CREATININE 1.09* 1.02*  CALCIUM  9.3 9.1    Intake/Output Summary (Last 24 hours) at 12/13/2023 0917 Last data filed at 12/12/2023 2200 Gross per 24 hour  Intake 477 ml  Output --  Net 477 ml        Physical Exam: Vital Signs Blood pressure 125/72, pulse 66, temperature 98.5 F (36.9 C), resp. rate 15, height 5' 3 (1.6 m), weight 54.5 kg, SpO2 99%. Constitutional: No distress . Vital signs reviewed. HEENT: NCAT, EOMI, oral membranes moist Neck: supple Cardiovascular: RRR without murmur. No JVD    Respiratory/Chest: CTA Bilaterally without wheezes or rales. Normal effort    GI/Abdomen: BS +, non-tender, non-distended Ext: no clubbing, cyanosis, hands well perfused but cool. Psych: pleasant and cooperative  Skin: intact Musculoskeletal:        General: No swelling or tenderness. Normal range of motion.     Cervical back: Normal range of motion.  Skin:    General: Skin is warm and dry.     Comments: Bilateral TKA scars  Neurological:     Mental  Status: She is alert.     Comments: Pt is alert, oriented to person, hospital, date with extra time. Word finding deficits still. Processing can be delayed. Fair awareness and insight.  CN exam non-focal. Speech generally clear. MMT: RUE 4+ to 5/5 prox to distal. LLE 4 to 4+/5 prox to distal. RLE 4/5 HF 4+ KE,  5/5 ADF/PF. LLE 3- to 3/5 HF, 3/5 KE and 3-4/5 ADF/PF.--no change 8/18.  Sensory exam normal for light touch and pain in all 4 limbs. No limb ataxia or cerebellar signs. No abnormal tone appreciated.  DTR's 1+.   Physical exam unchanged from the above on reexamination 12/13/23    Assessment/Plan: 1. Functional deficits which require 3+ hours per day of interdisciplinary therapy in a comprehensive inpatient rehab setting. Physiatrist is providing close team supervision and 24 hour management of active medical problems listed below. Physiatrist and rehab team continue to assess barriers to discharge/monitor patient progress toward functional and medical goals  Care Tool:  Bathing  Body parts bathed by patient: Right arm, Left arm, Abdomen, Right upper leg, Left upper leg, Face, Chest, Buttocks, Front perineal area, Right lower leg, Left lower leg   Body parts bathed by helper: Buttocks, Front perineal area, Right lower leg, Left lower leg, Left upper leg, Right upper leg Body parts n/a: Right lower leg, Left lower leg   Bathing assist Assist Level: Contact Guard/Touching assist     Upper Body Dressing/Undressing Upper body dressing   What is the patient wearing?: Bra, Pull over shirt    Upper body assist Assist Level: Minimal Assistance - Patient > 75%    Lower Body Dressing/Undressing Lower body dressing      What is the patient wearing?: Underwear/pull up, Pants     Lower body assist Assist for lower body dressing: Minimal Assistance - Patient > 75%     Toileting Toileting    Toileting assist Assist for toileting: Moderate Assistance - Patient 50 - 74%      Transfers Chair/bed transfer  Transfers assist     Chair/bed transfer assist level: Contact Guard/Touching assist     Locomotion Ambulation   Ambulation assist   Ambulation activity did not occur: Safety/medical concerns  Assist level: Contact Guard/Touching assist Assistive device: Walker-rolling Max distance: 180'   Walk 10 feet activity   Assist  Walk 10 feet activity did not occur: Safety/medical concerns  Assist level: Contact Guard/Touching assist Assistive device: Walker-rolling   Walk 50 feet activity   Assist Walk 50 feet with 2 turns activity did not occur: Safety/medical concerns  Assist level: Contact Guard/Touching assist Assistive device: Walker-rolling    Walk 150 feet activity   Assist Walk 150 feet activity did not occur: Safety/medical concerns  Assist level: Contact Guard/Touching assist Assistive device: Walker-rolling    Walk 10 feet on uneven surface  activity   Assist Walk 10 feet on uneven surfaces activity did not occur: Safety/medical concerns         Wheelchair     Assist Is the patient using a wheelchair?: Yes Type of Wheelchair: Manual    Wheelchair assist level: Dependent - Patient 0% Max wheelchair distance: 175'    Wheelchair 50 feet with 2 turns activity    Assist        Assist Level: Dependent - Patient 0%   Wheelchair 150 feet activity     Assist      Assist Level: Dependent - Patient 0%   Blood pressure 125/72, pulse 66, temperature 98.5 F (36.9 C), resp. rate 15, height 5' 3 (1.6 m), weight 54.5 kg, SpO2 99%.  Medical Problem List and Plan: 1. 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  Antithrombotics: -DVT/anticoagulation:  Pharmaceutical: continue Heparin , SCDs ordered             -antiplatelet therapy: N/A  3. Pain Management: Tylenol  as needed  4. Mood/Behavior/Sleep: continue Cymbalta  60 mg daily, Namenda  10 mg twice daily             -antipsychotic agents: N/A  5.  Neuropsych/cognition: This patient is not capable of making decisions on her own behalf.   - DC telesitter, not needed  6. Constipation: last BM 8/19, decrease regimen to senna 2 tabs HS, mag gluconate 500mg  daily added, d/c colace, messaged nursing to confirm date of last BM  -8-23:  Patient confirmed LBM 8/19; Start daily MiraLAX   7. Fluids/Electrolytes/Nutrition: Routine in and outs with follow-up chemistries on Monday  8.  Hypertension.  Continue Norvasc  10 mg daily, Toprol -XL 12.5 mg daily.  Monitor with increased mobility, magnesium  supplement started- continue 500mg   - 8-23: BP stable  9.  Hyperlipidemia continue Crestor   10. Likely peripheral arterial disease: would benefit from outpatient vascular follow-up, may benefit from outpatient Qutenza, d/c teds as worsens symptoms of cold feet  11. Fatigue: D3 increased to 2,000U daily, continue  12. AKI: asked SLP to work with her on a daily water  chart, discussed that creatinine has improved, will d/c IVF  - 8-23: Creatinine stable 1.02, BUN 25.  Continue current regimen.  13. UTI: keflex  started, UC shows E coli sensitive to Keflex   - 8-23: Continues incontinent of urine.  Initiate timed toileting, PVRs  14. Impaired initiation: amantadine  100mg  started daily  LOS: 8 days A FACE TO FACE EVALUATION WAS PERFORMED  [name] [name] Likes 12/13/2023, 9:17 AM

## 2023-12-13 NOTE — Plan of Care (Signed)
  Problem: Consults Goal: RH STROKE PATIENT EDUCATION Description: See Patient Education module for education specifics  Outcome: Progressing   Problem: RH BOWEL ELIMINATION Goal: RH STG MANAGE BOWEL WITH ASSISTANCE Description: STG Manage Bowel with toileting Assistance. Outcome: Progressing Goal: RH STG MANAGE BOWEL W/MEDICATION W/ASSISTANCE Description: STG Manage Bowel with Medication with mod I  Assistance. Outcome: Progressing   Problem: RH BLADDER ELIMINATION Goal: RH STG MANAGE BLADDER WITH ASSISTANCE Description: STG Manage Bladder With toileting Assistance Outcome: Progressing   Problem: RH SAFETY Goal: RH STG ADHERE TO SAFETY PRECAUTIONS W/ASSISTANCE/DEVICE Description: STG Adhere to Safety Precautions With cues Assistance/Device. Outcome: Progressing   Problem: RH PAIN MANAGEMENT Goal: RH STG PAIN MANAGED AT OR BELOW PT'S PAIN GOAL Description: Pain < 4 with prns Outcome: Progressing   Problem: RH KNOWLEDGE DEFICIT Goal: RH STG INCREASE KNOWLEDGE OF HYPERTENSION Description: Patient and dtr will be able to manage HTN using educational resources for medications and dietary modification independently Outcome: Progressing Goal: RH STG INCREASE KNOWLEDGE OF DYSPHAGIA/FLUID INTAKE Description: Patient and dtr will be able to manage dysphagia using educational resources for medications and dietary modification independently Outcome: Progressing Goal: RH STG INCREASE KNOWLEGDE OF HYPERLIPIDEMIA Description: Patient and dtr will be able to manage HLD using educational resources for medications and dietary modification independently Outcome: Progressing Goal: RH STG INCREASE KNOWLEDGE OF STROKE PROPHYLAXIS Description: Patient and dtr will be able to manage secondary risks using educational resources for medications and dietary modification independently Outcome: Progressing

## 2023-12-13 NOTE — Progress Notes (Signed)
 Physical Therapy Session Note  Patient Details  Name: [name] [name] MRN: [id number] Date of Birth: December 04, 1940  Today's Date: 12/13/2023 PT Individual Time: 9253-9158 PT Individual Time Calculation (min): 55 min   Short Term Goals: Week 1:  PT Short Term Goal 1 (Week 1): Pt will increase bed mobility to min A. PT Short Term Goal 2 (Week 1): Pt will increase transfers to min A. PT Short Term Goal 3 (Week 1): Pt will ambulate 50 feet with LRAD and min A PT Short Term Goal 4 (Week 1): Pt will ascend/descend 4 stairs with B rails and min A PT Short Term Goal 5 (Week 1): Pt will increase standing balance to min A  Skilled Therapeutic Interventions/Progress Updates:   Received pt semi-reclined in bed asleep. Upon wakening, pt agreeable to PT treatment and denied any pain during session. Session with emphasis on functional mobility/transfers, toileting, dressing, generalized strengthening and endurance, dynamic standing balance/coordination, and ambulation. Pt reported urge to toilet - transferred semi-reclined<>sitting L EOB with HOB elevated and use of bedrails with supervision ++ time. Donned socks and shoes with max A for time management.  Pt performed all transfers with RW and CGA ++ time to initiate throughout session - cues for hand placement. Pt ambulated in/out of bathroom with RW and CGA - cues for proximity to RW. Removed brief and pt continent of bladder and bowel with ++ time and required assist for hygiene management. When standing from commode - pt froze and unable to advance feet to ambulate to [facility] or follow cues despite allowing ++ time - sat down and reset, then ambulated out of bathroom - noted posterior LOB into WC upon sitting x 2, requiring cues to reach back for armrests prior to sitting. Donned clean brief with max A, then sat in WC and washed hands (assist to locate soap). Doffed gown and donned bra and pull over shirt with mod A. Donned pants with max A and stood to pull over hips  with mod A due to posterior bias. Combed hair and set pt up to eat breakfast. Concluded session with pt sitting in WC, needs within reach, and seatbelt alarm on. RN at bedside administering medication.   Therapy Documentation Precautions:  Precautions Precautions: Fall Recall of Precautions/Restrictions: Impaired Restrictions Weight Bearing Restrictions Per Provider Order: No  Therapy/Group: Individual Therapy Therisa HERO Zaunegger Therisa Stains PT, DPT 12/13/2023, 7:06 AM

## 2023-12-14 NOTE — Progress Notes (Signed)
 PROGRESS NOTE   Subjective/Complaints: No events overnight. No complaints.   Vitals with stable. Multiple large bowel movements 8-23  ROS: Patient denies fever, rash, sore throat, blurred vision, dizziness, nausea, vomiting, diarrhea, cough, shortness of breath or chest pain, joint or back/neck pain, headache, or mood change.  +constipation - resovled  Objective:   No results found. No results for input(s): WBC, HGB, HCT, PLT in the last 72 hours.  Recent Labs    12/12/23 0629  NA 139  K 4.7  CL 112*  CO2 20*  GLUCOSE 81  BUN 25*  CREATININE 1.02*  CALCIUM  9.1    Intake/Output Summary (Last 24 hours) at 12/14/2023 2340 Last data filed at 12/14/2023 1900 Gross per 24 hour  Intake 354 ml  Output --  Net 354 ml        Physical Exam: Vital Signs Blood pressure 127/68, pulse 76, temperature 98.2 F (36.8 C), temperature source Oral, resp. rate 16, height 5' 3 (1.6 m), weight 54.5 kg, SpO2 100%. Constitutional: No distress . Vital signs reviewed.  Sitting up in bedside chair. HEENT: NCAT, EOMI, oral membranes moist Neck: supple Cardiovascular: RRR without murmur. No JVD    Respiratory/Chest: CTA Bilaterally without wheezes or rales. Normal effort    GI/Abdomen: BS +, non-tender, non-distended Ext: no clubbing, cyanosis, hands well perfused but cool. Psych: pleasant and cooperative  Skin: intact Musculoskeletal:        General: No swelling or tenderness. Normal range of motion.     Cervical back: Normal range of motion.  Skin:    General: Skin is warm and dry.     Comments: Bilateral TKA scars  Neurological:     Mental Status: She is alert.     Comments: Awake, alert, and oriented x 3. + Speech delay, no appreciated word finding deficits.  100% intelligible.  MMT: RUE 4+ to 5/5 prox to distal. LLE 4 to 4+/5 prox to distal.  RLE 4/5 HF 4+ KE,  5/5 ADF/PF.  LLE 3- to 3/5 HF, 3/5 KE and 3-4/5  ADF/PF. --no change  Sensory exam normal for light touch and pain in all 4 limbs.  No limb ataxia or cerebellar signs.  No abnormal tone appreciated    Assessment/Plan: 1. Functional deficits which require 3+ hours per day of interdisciplinary therapy in a comprehensive inpatient rehab setting. Physiatrist is providing close team supervision and 24 hour management of active medical problems listed below. Physiatrist and rehab team continue to assess barriers to discharge/monitor patient progress toward functional and medical goals  Care Tool:  Bathing    Body parts bathed by patient: Right arm, Left arm, Abdomen, Right upper leg, Left upper leg, Face, Chest, Buttocks, Front perineal area, Right lower leg, Left lower leg   Body parts bathed by helper: Buttocks, Front perineal area, Right lower leg, Left lower leg, Left upper leg, Right upper leg Body parts n/a: Right lower leg, Left lower leg   Bathing assist Assist Level: Contact Guard/Touching assist     Upper Body Dressing/Undressing Upper body dressing   What is the patient wearing?: Bra, Pull over shirt    Upper body assist Assist Level: Minimal Assistance - Patient >  75%    Lower Body Dressing/Undressing Lower body dressing      What is the patient wearing?: Underwear/pull up, Pants     Lower body assist Assist for lower body dressing: Minimal Assistance - Patient > 75%     Toileting Toileting    Toileting assist Assist for toileting: Moderate Assistance - Patient 50 - 74%     Transfers Chair/bed transfer  Transfers assist     Chair/bed transfer assist level: Contact Guard/Touching assist     Locomotion Ambulation   Ambulation assist   Ambulation activity did not occur: Safety/medical concerns  Assist level: Contact Guard/Touching assist Assistive device: Walker-rolling Max distance: 180'   Walk 10 feet activity   Assist  Walk 10 feet activity did not occur: Safety/medical  concerns  Assist level: Contact Guard/Touching assist Assistive device: Walker-rolling   Walk 50 feet activity   Assist Walk 50 feet with 2 turns activity did not occur: Safety/medical concerns  Assist level: Contact Guard/Touching assist Assistive device: Walker-rolling    Walk 150 feet activity   Assist Walk 150 feet activity did not occur: Safety/medical concerns  Assist level: Contact Guard/Touching assist Assistive device: Walker-rolling    Walk 10 feet on uneven surface  activity   Assist Walk 10 feet on uneven surfaces activity did not occur: Safety/medical concerns         Wheelchair     Assist Is the patient using a wheelchair?: Yes Type of Wheelchair: Manual    Wheelchair assist level: Dependent - Patient 0% Max wheelchair distance: 175'    Wheelchair 50 feet with 2 turns activity    Assist        Assist Level: Dependent - Patient 0%   Wheelchair 150 feet activity     Assist      Assist Level: Dependent - Patient 0%   Blood pressure 127/68, pulse 76, temperature 98.2 F (36.8 C), temperature source Oral, resp. rate 16, height 5' 3 (1.6 m), weight 54.5 kg, SpO2 100%.  Medical Problem List and Plan: 1. 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  Antithrombotics: -DVT/anticoagulation:  Pharmaceutical: continue Heparin , SCDs ordered             -antiplatelet therapy: N/A  3. Pain Management: Tylenol  as needed  4. Mood/Behavior/Sleep: continue Cymbalta  60 mg daily, Namenda  10 mg twice daily             -antipsychotic agents: N/A  5.  Neuropsych/cognition: This patient is not capable of making decisions on her own behalf.   - DC telesitter, not needed  6. Constipation: last BM 8/19, decrease regimen to senna 2 tabs HS, mag gluconate 500mg  daily added, d/c colace, messaged nursing to confirm date of last BM  -8-23:  Patient confirmed LBM 8/19; Start daily MiraLAX   8-24: Multiple bowel movements yesterday.  None today.  Continue current regimen  7. Fluids/Electrolytes/Nutrition: Routine in and outs with follow-up chemistries on Monday  8.  Hypertension.  Continue Norvasc  10 mg daily, Toprol -XL 12.5 mg daily.  Monitor with  increased mobility, magnesium  supplement started- continue 500mg   - 8-23: BP stable  9.  Hyperlipidemia continue Crestor   10. Likely peripheral arterial disease: would benefit from outpatient vascular follow-up, may benefit from outpatient Qutenza, d/c teds as worsens symptoms of cold feet  11. Fatigue: D3 increased to 2,000U daily, continue  12. AKI: asked SLP to work with her on a daily water  chart, discussed that creatinine has improved, will d/c IVF  - 8-23: Creatinine stable 1.02, BUN 25.  Continue current regimen.  13. UTI: keflex  started, UC shows E coli sensitive to Keflex   - 8-23: Continues incontinent of urine.  Initiate timed toileting, PVRs  8/24: No PVRs.  Remains incontinent.   Continue to monitor through use of antibiotics.  14. Impaired initiation: amantadine  100mg  started daily  LOS: 9 days A FACE TO FACE EVALUATION WAS PERFORMED  [name] [name] 12/14/2023, 11:40 PM

## 2023-12-14 NOTE — Plan of Care (Signed)
  Problem: Consults Goal: RH STROKE PATIENT EDUCATION Description: See Patient Education module for education specifics  Outcome: Progressing   Problem: RH BOWEL ELIMINATION Goal: RH STG MANAGE BOWEL WITH ASSISTANCE Description: STG Manage Bowel with toileting Assistance. Outcome: Progressing Goal: RH STG MANAGE BOWEL W/MEDICATION W/ASSISTANCE Description: STG Manage Bowel with Medication with mod I Assistance. Outcome: Progressing

## 2023-12-14 NOTE — Plan of Care (Signed)
  Problem: Consults Goal: RH STROKE PATIENT EDUCATION Description: See Patient Education module for education specifics  Outcome: Progressing   Problem: RH BOWEL ELIMINATION Goal: RH STG MANAGE BOWEL WITH ASSISTANCE Description: STG Manage Bowel with toileting Assistance. Outcome: Progressing Goal: RH STG MANAGE BOWEL W/MEDICATION W/ASSISTANCE Description: STG Manage Bowel with Medication with mod I  Assistance. Outcome: Progressing   Problem: RH BLADDER ELIMINATION Goal: RH STG MANAGE BLADDER WITH ASSISTANCE Description: STG Manage Bladder With toileting Assistance Outcome: Progressing   Problem: RH SAFETY Goal: RH STG ADHERE TO SAFETY PRECAUTIONS W/ASSISTANCE/DEVICE Description: STG Adhere to Safety Precautions With cues Assistance/Device. Outcome: Progressing   Problem: RH PAIN MANAGEMENT Goal: RH STG PAIN MANAGED AT OR BELOW PT'S PAIN GOAL Description: Pain < 4 with prns Outcome: Progressing   Problem: RH KNOWLEDGE DEFICIT Goal: RH STG INCREASE KNOWLEDGE OF HYPERTENSION Description: Patient and dtr will be able to manage HTN using educational resources for medications and dietary modification independently Outcome: Progressing Goal: RH STG INCREASE KNOWLEDGE OF DYSPHAGIA/FLUID INTAKE Description: Patient and dtr will be able to manage dysphagia using educational resources for medications and dietary modification independently Outcome: Progressing Goal: RH STG INCREASE KNOWLEGDE OF HYPERLIPIDEMIA Description: Patient and dtr will be able to manage HLD using educational resources for medications and dietary modification independently Outcome: Progressing Goal: RH STG INCREASE KNOWLEDGE OF STROKE PROPHYLAXIS Description: Patient and dtr will be able to manage secondary risks using educational resources for medications and dietary modification independently Outcome: Progressing

## 2023-12-15 ENCOUNTER — Other Ambulatory Visit (HOSPITAL_COMMUNITY): Payer: Self-pay

## 2023-12-15 MED ORDER — METOPROLOL SUCCINATE ER 25 MG PO TB24
12.5000 mg | ORAL_TABLET | Freq: Every day | ORAL | 0 refills | Status: DC
Start: 2023-12-15 — End: 2024-01-16
  Filled 2023-12-15: qty 30, 60d supply, fill #0

## 2023-12-15 MED ORDER — DULOXETINE HCL 60 MG PO CPEP
60.0000 mg | ORAL_CAPSULE | Freq: Two times a day (BID) | ORAL | 0 refills | Status: DC
  Filled 2023-12-15: qty 60, 30d supply, fill #0

## 2023-12-15 MED ORDER — DOCUSATE SODIUM 100 MG PO CAPS: 100.0000 mg | ORAL_CAPSULE | Freq: Every day | ORAL | Status: AC

## 2023-12-15 MED ORDER — ROSUVASTATIN CALCIUM 20 MG PO TABS
20.0000 mg | ORAL_TABLET | Freq: Every day | ORAL | 0 refills | Status: DC
  Filled 2023-12-15: qty 30, 30d supply, fill #0

## 2023-12-15 MED ORDER — AMLODIPINE BESYLATE 10 MG PO TABS
10.0000 mg | ORAL_TABLET | Freq: Every day | ORAL | 0 refills | Status: DC
  Filled 2023-12-15: qty 30, 30d supply, fill #0

## 2023-12-15 MED ORDER — MAGNESIUM GLUCONATE 500 (27 MG) MG PO TABS
500.0000 mg | ORAL_TABLET | Freq: Every day | ORAL | 0 refills | Status: AC
  Filled 2023-12-15: qty 30, 30d supply, fill #0

## 2023-12-15 MED ORDER — POLYETHYLENE GLYCOL 3350 17 G PO PACK: 17.0000 g | PACK | Freq: Every day | ORAL | Status: AC

## 2023-12-15 MED ORDER — CEPHALEXIN 500 MG PO CAPS
500.0000 mg | ORAL_CAPSULE | Freq: Two times a day (BID) | ORAL | 0 refills | Status: DC
  Filled 2023-12-15: qty 4, 2d supply, fill #0

## 2023-12-15 MED ORDER — VITAMIN D3 25 MCG PO TABS
2000.0000 [IU] | ORAL_TABLET | Freq: Every day | ORAL | 0 refills | Status: AC
  Filled 2023-12-15: qty 30, 15d supply, fill #0

## 2023-12-15 MED ORDER — DULOXETINE HCL 60 MG PO CPEP
60.0000 mg | ORAL_CAPSULE | Freq: Every day | ORAL | 0 refills | Status: DC
  Filled 2023-12-15: qty 30, 30d supply, fill #0

## 2023-12-15 MED ORDER — MEMANTINE HCL 10 MG PO TABS
10.0000 mg | ORAL_TABLET | Freq: Two times a day (BID) | ORAL | 0 refills | Status: AC
  Filled 2023-12-15: qty 60, 30d supply, fill #0

## 2023-12-15 NOTE — Progress Notes (Signed)
 Speech Language Pathology Discharge Summary  Patient Details  Name: [name] [name] MRN: [id number] Date of Birth: 22-Aug-1940  Date of Discharge from SLP service:December 15, 2023  Patient has met 4 of 4 long term goals.  Patient to discharge at overall Mod level.  Reasons goals not met: n/a   Clinical Impression/Discharge Summary:  Patient has made excellent progress towards therapy goals during CIR admission meeting 4/4 long term goals set for duration of stay. Patient requires overall mod assist for BASIC problem solving, sustained attention, recall of daily information, and orientation using external aids. She would benefit from continued therapy at next venue of care to target aforementioned deficits. Patient and family education complete. SLP will sign off.   Care Partner:  Caregiver Able to Provide Assistance: Yes  Type of Caregiver Assistance: Cognitive  Recommendation:  Home Health SLP;24 hour supervision/assistance;Outpatient SLP  Rationale for SLP Follow Up: Maximize cognitive function and independence   Equipment: n/a   Reasons for discharge: Discharged from hospital   Patient/Family Agrees with Progress Made and Goals Achieved: Yes   Onesti Bonfiglio, M.A., CCC-SLP  Adianna Darwin A Sharyl Panchal 12/15/2023, 3:21 PM

## 2023-12-15 NOTE — Progress Notes (Signed)
 Inpatient Rehabilitation Discharge Medication Review by a Pharmacist  A complete drug regimen review was completed for this patient to identify any potential clinically significant medication issues.  High Risk Drug Classes Is patient taking? Indication by Medication  Antipsychotic No   Anticoagulant No   Antibiotic Yes  Keflex - UTI   Opioid No   Antiplatelet No   Hypoglycemics/insulin No   Vasoactive Medication Yes Amlodipine , Metoprolol - HTN  Chemotherapy No   Other Yes Duloxetine  - mood Memantine  - AD Rosuvastatin  - HLD Magnesium  gluconate, cholecalciferol - supplement  Miralax - constipation      Type of Medication Issue Identified Description of Issue Recommendation(s)  Drug Interaction(s) (clinically significant)     Duplicate Therapy     Allergy     No Medication Administration End Date     Incorrect Dose     Additional Drug Therapy Needed     Significant med changes from prior encounter (inform family/care partners about these prior to discharge).    Other       Clinically significant medication issues were identified that warrant physician communication and completion of prescribed/recommended actions by midnight of the next day:  No  Name of provider notified for urgent issues identified:   Provider Method of Notification:     Pharmacist comments: None  Time spent performing this drug regimen review (minutes):  20 minutes  Massie Fila, PharmD Clinical Pharmacist  12/15/2023 7:55 AM

## 2023-12-15 NOTE — Progress Notes (Signed)
 Physical Therapy Discharge Summary  Patient Details  Name: [name] [name] MRN: [id number] Date of Birth: [birth year]  Date of Discharge from PT service:December 15, 2023  Today's Date: 12/15/2023 PT Individual Time: 0800-0856 PT Individual Time Calculation (min): 56 min    Patient has met 8 of 8 long term goals due to improved activity tolerance, improved balance, increased strength, improved attention, and improved awareness.  Patient to discharge at an ambulatory level Min Assist.   Patient's care partner is independent to provide the necessary physical and cognitive assistance at discharge.  Reasons goals not met: n/a  Recommendation:  Patient will benefit from ongoing skilled PT services in home health setting to continue to advance safe functional mobility, address ongoing impairments in home safety, caregiver training, functional mobility, standing balance, and minimize fall risk.  Equipment: No equipment provided. Pt owns RW and transport w/c  Reasons for discharge: treatment goals met and discharge from hospital  Patient/family agrees with progress made and goals achieved: Yes  PT Discharge Precautions/Restrictions Precautions Precautions: Fall Restrictions Weight Bearing Restrictions Per Provider Order: No Pain Interference Pain Interference Pain Effect on Sleep: 0. Does not apply - I have not had any pain or hurting in the past 5 days Pain Interference with Therapy Activities: 0. Does not apply - I have not received rehabilitationtherapy in the past 5 days Pain Interference with Day-to-Day Activities: 1. Rarely or not at all Vision/Perception  Vision - History Ability to See in Adequate Light: 0 Adequate Perception Perception: Impaired Preception Impairment Details: Inattention/Neglect Perception-Other Comments: L inattention (mild) Praxis Praxis: Impaired Praxis Impairment Details: Initiation;Motor planning  Cognition Overall Cognitive Status: History of cognitive  impairments - at baseline Arousal/Alertness: Awake/alert Orientation Level: Oriented to person;Oriented to place Attention: Sustained Sustained Attention: Impaired Sustained Attention Impairment: Functional basic Memory: Impaired Memory Impairment: Decreased recall of new information;Decreased short term memory Decreased Short Term Memory: Functional basic Awareness: Impaired Awareness Impairment: Intellectual impairment Problem Solving: Impaired Problem Solving Impairment: Functional basic Executive Function: Initiating;Organizing;Self Monitoring;Self Correcting Reasoning: Impaired Reasoning Impairment: Functional basic Sequencing: Impaired Sequencing Impairment: Functional basic Organizing: Impaired Organizing Impairment: Functional basic Initiating: Impaired Initiating Impairment: Functional basic Self Monitoring: Impaired Self Monitoring Impairment: Functional basic Self Correcting: Impaired Self Correcting Impairment: Functional basic Behaviors: Perseveration Safety/Judgment: Impaired Sensation Sensation Light Touch: Impaired by gross assessment Hot/Cold: Appears Intact Proprioception: Impaired by gross assessment Stereognosis: Impaired by gross assessment Coordination Gross Motor Movements are Fluid and Coordinated: No Fine Motor Movements are Fluid and Coordinated: No Coordination and Movement Description: Fear of falling, slow to initiate and process Motor  Motor Motor: Hemiplegia;Motor impersistence;Abnormal postural alignment and control Motor - Skilled Clinical Observations: L sided weakness, scoliotic curvature Motor - Discharge Observations: Left weakness inattention  Mobility Bed Mobility Bed Mobility: Rolling Right;Rolling Left;Sit to Supine;Supine to Sit Rolling Right: Minimal Assistance - Patient > 75% Rolling Left: Minimal Assistance - Patient > 75% Supine to Sit: Minimal Assistance - Patient > 75% Sit to Supine: Minimal Assistance - Patient >  75% Transfers Transfers: Stand to Sit;Sit to Stand;Stand Pivot Transfers Sit to Stand: Contact Guard/Touching assist Stand to Sit: Contact Guard/Touching assist Stand Pivot Transfers: Minimal Assistance - Patient > 75% Stand Pivot Transfer Details: Tactile cues for initiation;Tactile cues for posture;Verbal cues for safe use of DME/AE;Verbal cues for gait pattern;Verbal cues for technique;Verbal cues for precautions/safety;Verbal cues for sequencing Transfer (Assistive device): Rolling walker Locomotion  Gait Ambulation: Yes Gait Assistance: Contact Guard/Touching assist Gait Distance (Feet): 150 Feet Assistive device: Rolling walker Gait Assistance  Details: Verbal cues for safe use of DME/AE;Verbal cues for gait pattern;Verbal cues for precautions/safety;Verbal cues for technique;Verbal cues for sequencing Gait Gait: Yes Gait Pattern: Impaired Gait Pattern: Decreased step length - right;Decreased step length - left;Decreased stride length;Shuffle;Festinating;Trunk flexed;Poor foot clearance - left;Poor foot clearance - right;Narrow base of support Stairs / Additional Locomotion Stairs: Yes Stairs Assistance: Contact Guard/Touching assist Stair Management Technique: Two rails;Step to pattern;Forwards Number of Stairs: 12 Height of Stairs: 6 Wheelchair Mobility Wheelchair Mobility: No  Trunk/Postural Assessment  Cervical Assessment Cervical Assessment: Exceptions to [facility] (forward head) Thoracic Assessment Thoracic Assessment: Exceptions to [facility] (scoliosis) Lumbar Assessment Lumbar Assessment: Exceptions to [facility] (posterior tilt) Postural Control Postural Control: Deficits on evaluation Righting Reactions: posterior bias  Balance   Balance Balance Assessed: Yes Standardized Balance Assessment Standardized Balance Assessment: Timed Up and Go Test Timed Up and Go Test TUG: Normal TUG Normal TUG (seconds): 174 Static Sitting Balance Static Sitting - Balance Support: Feet  supported;Bilateral upper extremity supported Static Sitting - Level of Assistance: 7: Independent Static Standing Balance Static Standing - Balance Support: During functional activity Static Standing - Level of Assistance: 4: Min assist Dynamic Standing Balance Dynamic Standing - Balance Support: During functional activity Dynamic Standing - Level of Assistance: 4: Min assist Extremity Assessment      RLE Assessment RLE Assessment: Exceptions to [facility] General Strength Comments: Grossly 4/5 LLE Assessment LLE Assessment: Exceptions to [facility] General Strength Comments: Grossly 4-/5   Treatment: Pt presents in bed, finishing up her breakfast. She has no complaints of pain. Patient assisted to EOB with supervision assist with [facility] nearly flat. Has more difficulty scooting forward to EOB to get her feet flat. Donned 2nd gown to cover her backside. Sit<>Stand and stand pivot transfer completed with minA without the use of an AD. Transported to main gym for time management.  Pt instructed in gait 151ft with CGA to close supervision using the RW. Pt ambulates with short shuffling steps and keep's the RW far in front of her. Cues for increasing stride length, gait speed, and proximity to her RW - limited corrections made despite cues.   Timed up and Go completed with RW: - 2 minutes and 54 seconds with SBA/CGA for turns.  *scores > 13.5 seconds indicate increased falls risk.   Pt instructed in stair training using both 6 and 3 steps with 2 hand rails. She navigated a total of x12 steps using a combination of those step heights, completed with CGA/minA with a step-to pattern for both directions. Cues needed for keeping hands in front of her and advancing appropriately along hand rails during stair navigation.   Pt returned to her room at completion of session - left up in wheelchair with seat belt alarm on and call bell in reach. Personal items next to her on tray table. Window blinds opened to help  with day/night awareness.  Sherlean SHAUNNA Perks 12/15/2023, 8:37 AM

## 2023-12-15 NOTE — Progress Notes (Signed)
 Physical Therapy Session Note  Patient Details  Name: [name] [name] MRN: [id number] Date of Birth: [birth year]  Today's Date: 12/15/2023 PT Individual Time: 8694-8654 PT Individual Time Calculation (min): 40 min   Short Term Goals: Week 1:  PT Short Term Goal 1 (Week 1): Pt will increase bed mobility to min A. PT Short Term Goal 2 (Week 1): Pt will increase transfers to min A. PT Short Term Goal 3 (Week 1): Pt will ambulate 50 feet with LRAD and min A PT Short Term Goal 4 (Week 1): Pt will ascend/descend 4 stairs with B rails and min A PT Short Term Goal 5 (Week 1): Pt will increase standing balance to min A  Skilled Therapeutic Interventions/Progress Updates:    Pt presents in room in [facility], agreeable to PT. Pt does not report pain at start of session, but reports feeling pain in R knee with sit to stand transfer that subsides once standing. Session focused on therapeutic activities for facilitating improved sequencing with transfer as well as gait training for navigating ramp and increasing gait speed. Pt  transported to ortho gym via WC. Pt completes ambulatory transfer to car with significantly increased time and cues for attention to task as pt highly distractible. Pt completes car transfer with CGA and significant time. Pt then ambulates up/down ramp with CGA, mod/max cues for increasing proximity to RW and increasing step height/length with task however pt continues to demonstrating short shuffling gait, decreased proximity, no LOB. Pt requires max cues for turning to position prior to sitting in WC. Pt then completes gait 120' without device, holding around therapist waist, minA x2 on either side of pt to encourage increased gait speed and step length, able to increase speed initially but does demonstrate some rigidity with fatigue. Pt returns to room and remains seated in [facility] with all needs within reach, cal light in place and chair alarm donned and activated at end of session.  Therapy  Documentation Precautions:  Precautions Precautions: Fall Recall of Precautions/Restrictions: Impaired Restrictions Weight Bearing Restrictions Per Provider Order: No    Therapy/Group: Individual Therapy  Reche Ohara PT, DPT 12/15/2023, 1:46 PM

## 2023-12-15 NOTE — Plan of Care (Signed)
  Problem: RH Cognition - SLP Goal: RH LTG Patient will demonstrate orientation with cues Description:  LTG:  Patient will demonstrate orientation to person/place/time/situation with cues (SLP)   Outcome: Completed/Met   Problem: RH Problem Solving Goal: LTG Patient will demonstrate problem solving for (SLP) Description: LTG:  Patient will demonstrate problem solving for basic/complex daily situations with cues  (SLP) Outcome: Completed/Met   Problem: RH Memory Goal: LTG Patient will use memory compensatory aids to (SLP) Description: LTG:  Patient will use memory compensatory aids to recall biographical/new, daily complex information with cues (SLP) Outcome: Completed/Met   Problem: RH Attention Goal: LTG Patient will demonstrate this level of attention during functional activites (SLP) Description: LTG:  Patient will will demonstrate this level of attention during functional activites (SLP) Outcome: Completed/Met

## 2023-12-15 NOTE — Progress Notes (Signed)
 Speech Language Pathology Daily Session Note  Patient Details  Name: [name] [name] MRN: [id number] Date of Birth: 05-16-1940  Today's Date: 12/15/2023 SLP Individual Time: 1100-1142 SLP Individual Time Calculation (min): 42 min  Short Term Goals: Week 1: SLP Short Term Goal 1 (Week 1): Patient will utilize external memory aids to recall daily information with 80% accuracy given mod assist. SLP Short Term Goal 2 (Week 1): Patient will complete basic functional problem solving tasks with 80% accuracy given mod multimodal cues. SLP Short Term Goal 3 (Week 1): Patient will maintain topic during conversation re: biographical information with mod cues from conversation partner. SLP Short Term Goal 4 (Week 1): Patient will orient to year and month using external aids in 4/5 opportunities given mod assist.  Skilled Therapeutic Interventions: Patient was seen for skilled ST services. The clinician presented the patient with three activities to target cognition, comprehension, and expressive language. SLP administered a word retrieval activity task where patient completed unfinished phrases. She independently retrieved 11/15 words and required min assist for the remaining. The clinician then presented the patient with an immediate memory task for three word sequences. Patient completed task with supervision, thus task complexity was increased. Patient benefited from min cues for working memory during remainder of task. The remainder of the session was used to target functional problem solving and sustained attention through matching tasks. The patient independently matched the appropriate word to its corresponding image, but required significantly increased processing time and reduction of visual field as well as cues to initiate writing letters on the provided lines. Patient was left in chair with call bell in reach and chair alarm set. SLP will continue to target goals per plan of care.   Pain  None  reported  Therapy/Group: Individual Therapy  Shadonna Benedick Kwakye-Ndlovu 12/15/2023, 3:09 PM

## 2023-12-15 NOTE — Progress Notes (Signed)
 PROGRESS NOTE   Subjective/Complaints: No new complaints this morning Patient's chart reviewed- No issues reported overnight Vitals signs stable   ROS: Patient denies fever, rash, sore throat, blurred vision, dizziness, nausea, vomiting, diarrhea, cough, shortness of breath or chest pain, joint or back/neck pain, headache, or mood change.  +constipation - resovled  Objective:   No results found. No results for input(s): WBC, HGB, HCT, PLT in the last 72 hours.  No results for input(s): NA, K, CL, CO2, GLUCOSE, BUN, CREATININE, CALCIUM  in the last 72 hours.   Intake/Output Summary (Last 24 hours) at 12/15/2023 1126 Last data filed at 12/15/2023 0800 Gross per 24 hour  Intake 476 ml  Output --  Net 476 ml        Physical Exam: Vital Signs Blood pressure 138/76, pulse 78, temperature 97.8 F (36.6 C), temperature source Oral, resp. rate 16, height 5' 3 (1.6 m), weight 54.5 kg, SpO2 100%. Constitutional: No distress . Vital signs reviewed.  Sitting up in bedside chair. HEENT: NCAT, EOMI, oral membranes moist Neck: supple Cardiovascular: RRR without murmur. No JVD    Respiratory/Chest: CTA Bilaterally without wheezes or rales. Normal effort    GI/Abdomen: BS +, non-tender, non-distended Ext: no clubbing, cyanosis, hands well perfused but cool. Psych: pleasant and cooperative  Skin: intact Musculoskeletal:        General: No swelling or tenderness. Normal range of motion.     Cervical back: Normal range of motion.  Skin:    General: Skin is warm and dry.     Comments: Bilateral TKA scars  Neurological:     Mental Status: She is alert.     Comments: Awake, alert, and oriented x 3. + Speech delay, no appreciated word finding deficits.  100% intelligible.  MMT: RUE 4+ to 5/5 prox to distal. LLE 4 to 4+/5 prox to distal.  RLE 4/5 HF 4+ KE,  5/5 ADF/PF.  LLE 3- to 3/5 HF, 3/5 KE and 3-4/5  ADF/PF. Stable 12/15/23  Sensory exam normal for light touch and pain in all 4 limbs.  No limb ataxia or cerebellar signs.  No abnormal tone appreciated    Assessment/Plan: 1. Functional deficits which require 3+ hours per day of interdisciplinary therapy in a comprehensive inpatient rehab setting. Physiatrist is providing close team supervision and 24 hour management of active medical problems listed below. Physiatrist and rehab team continue to assess barriers to discharge/monitor patient progress toward functional and medical goals  Care Tool:  Bathing    Body parts bathed by patient: Right arm, Left arm, Abdomen, Right upper leg, Left upper leg, Face, Chest, Buttocks, Front perineal area, Right lower leg, Left lower leg   Body parts bathed by helper: Buttocks, Front perineal area, Right lower leg, Left lower leg, Left upper leg, Right upper leg Body parts n/a: Right lower leg, Left lower leg   Bathing assist Assist Level: Contact Guard/Touching assist     Upper Body Dressing/Undressing Upper body dressing   What is the patient wearing?: Bra, Pull over shirt    Upper body assist Assist Level: Minimal Assistance - Patient > 75%    Lower Body Dressing/Undressing Lower body dressing  What is the patient wearing?: Underwear/pull up, Pants     Lower body assist Assist for lower body dressing: Minimal Assistance - Patient > 75%     Toileting Toileting    Toileting assist Assist for toileting: Moderate Assistance - Patient 50 - 74%     Transfers Chair/bed transfer  Transfers assist     Chair/bed transfer assist level: Contact Guard/Touching assist     Locomotion Ambulation   Ambulation assist   Ambulation activity did not occur: Safety/medical concerns  Assist level: Contact Guard/Touching assist Assistive device: Walker-rolling Max distance: 180'   Walk 10 feet activity   Assist  Walk 10 feet activity did not occur: Safety/medical  concerns  Assist level: Contact Guard/Touching assist Assistive device: Walker-rolling   Walk 50 feet activity   Assist Walk 50 feet with 2 turns activity did not occur: Safety/medical concerns  Assist level: Contact Guard/Touching assist Assistive device: Walker-rolling    Walk 150 feet activity   Assist Walk 150 feet activity did not occur: Safety/medical concerns  Assist level: Contact Guard/Touching assist Assistive device: Walker-rolling    Walk 10 feet on uneven surface  activity   Assist Walk 10 feet on uneven surfaces activity did not occur: Safety/medical concerns         Wheelchair     Assist Is the patient using a wheelchair?: Yes Type of Wheelchair: Manual    Wheelchair assist level: Dependent - Patient 0% Max wheelchair distance: 175'    Wheelchair 50 feet with 2 turns activity    Assist        Assist Level: Dependent - Patient 0%   Wheelchair 150 feet activity     Assist      Assist Level: Dependent - Patient 0%   Blood pressure 138/76, pulse 78, temperature 97.8 F (36.6 C), temperature source Oral, resp. rate 16, height 5' 3 (1.6 m), weight 54.5 kg, SpO2 100%.  Medical Problem List and Plan: 1. 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  Antithrombotics: -DVT/anticoagulation:  Pharmaceutical: continue Heparin , SCDs ordered             -antiplatelet therapy: N/A  3. Pain Management: Tylenol  as needed  4. Mood/Behavior/Sleep: continue Cymbalta  60 mg daily, Namenda  10 mg twice daily             -antipsychotic agents: N/A  5.  Neuropsych/cognition: This patient is not capable of making decisions on her own behalf.   - DC telesitter, not needed  6. Constipation: last BM 8/19, decrease regimen to senna 2 tabs HS, mag gluconate 500mg  daily added, d/c colace, messaged nursing to confirm date of last BM  -8-23:  Patient confirmed LBM 8/19; Start daily MiraLAX   8-24: Multiple bowel movements yesterday.  None today.  Continue current regimen  7. Fluids/Electrolytes/Nutrition: Routine in and outs with follow-up chemistries on Monday  8.  Hypertension.  Continue Norvasc  10 mg daily, Toprol -XL 12.5 mg daily.  Monitor with increased mobility, magnesium  supplement started- continue 500mg   - 8-23: BP stable  9.  Hyperlipidemia: continue Crestor   10. Likely peripheral arterial disease: would benefit from outpatient vascular follow-up, may benefit from outpatient Qutenza, d/c teds as worsens symptoms of cold feet  11. Fatigue: D3 increased to 2,000U daily, continue  12. AKI: asked SLP to work with her on a daily water  chart, discussed that creatinine has improved, will d/c IVF  Creatinine stable 1.02, BUN 25.  Continue current regimen.  13. UTI: keflex  started, UC shows E coli sensitive to Keflex   - 8-23: Continues incontinent of urine.  Initiate timed toileting, PVRs  Continue to monitor through use of antibiotics.  14. Impaired initiation: amantadine  100mg  started daily, continue  LOS: 10 days A FACE TO FACE EVALUATION WAS PERFORMED  Cicely Ortner P Thaddeus Evitts 12/15/2023, 11:26 AM

## 2023-12-15 NOTE — Plan of Care (Signed)
  Problem: Consults Goal: RH STROKE PATIENT EDUCATION Description: See Patient Education module for education specifics  Outcome: Progressing   Problem: RH BOWEL ELIMINATION Goal: RH STG MANAGE BOWEL WITH ASSISTANCE Description: STG Manage Bowel with toileting Assistance. Outcome: Progressing Goal: RH STG MANAGE BOWEL W/MEDICATION W/ASSISTANCE Description: STG Manage Bowel with Medication with mod I  Assistance. Outcome: Progressing   Problem: RH BLADDER ELIMINATION Goal: RH STG MANAGE BLADDER WITH ASSISTANCE Description: STG Manage Bladder With toileting Assistance Outcome: Progressing   Problem: RH SAFETY Goal: RH STG ADHERE TO SAFETY PRECAUTIONS W/ASSISTANCE/DEVICE Description: STG Adhere to Safety Precautions With cues Assistance/Device. Outcome: Progressing   Problem: RH PAIN MANAGEMENT Goal: RH STG PAIN MANAGED AT OR BELOW PT'S PAIN GOAL Description: Pain < 4 with prns Outcome: Progressing   Problem: RH KNOWLEDGE DEFICIT Goal: RH STG INCREASE KNOWLEDGE OF HYPERTENSION Description: Patient and dtr will be able to manage HTN using educational resources for medications and dietary modification independently Outcome: Progressing Goal: RH STG INCREASE KNOWLEDGE OF DYSPHAGIA/FLUID INTAKE Description: Patient and dtr will be able to manage dysphagia using educational resources for medications and dietary modification independently Outcome: Progressing Goal: RH STG INCREASE KNOWLEGDE OF HYPERLIPIDEMIA Description: Patient and dtr will be able to manage HLD using educational resources for medications and dietary modification independently Outcome: Progressing Goal: RH STG INCREASE KNOWLEDGE OF STROKE PROPHYLAXIS Description: Patient and dtr will be able to manage secondary risks using educational resources for medications and dietary modification independently Outcome: Progressing

## 2023-12-15 NOTE — Progress Notes (Signed)
 Occupational Therapy Session Note  Patient Details  Name: [name] [name] MRN: [id number] Date of Birth: 1940-05-11  Today's Date: 12/15/2023 OT Individual Time: 9067-8982 OT Individual Time Calculation (min): 45 min    Short Term Goals: Week 2:  OT Short Term Goal 1 (Week 2): Goals downgraded to reflect prior dementia diagnosis as well as slower than anticipated progress.  STG = new LTG due to los  Skilled Therapeutic Interventions/Progress Updates:  Pt greeted seated in w/c, pt agreeable to OT intervention.      Transfers/bed mobility/functional mobility:  Pt completed sit>stands with CGA with RW and functional ambulation with RW and CGA.   ADLs:  Grooming: pt completed seated grooming tasks at sink with supervision but min cues provided for sequencing and manipulating novel sink. Pt donned deo from sitting position with set- up assist.  UB dressing:pt donned bra and OH shirt with MIN A LB dressing: pt donned pants and fresh brief with MIN A needed to thread and pull to waist line.  Footwear: pt able to doff/don  L sock via figure 4 with set- up assist but needed total A to don R sock. Pt able to slide on slip on shoe with set- up assist.   Transfers: ambulatory transfer into bathroom with RW and CGA Toileting: pt with both continent/incontinent urine void during session. MIN A for 3/3 toileting tasks.    Cognition: pt overall slow to initiate and follow commands                Ended session with pt seated in w/c with all needs within reach and safety belt alarm activated.                    Therapy Documentation Precautions:  Precautions Precautions: Fall Recall of Precautions/Restrictions: Impaired Restrictions Weight Bearing Restrictions Per Provider Order: No  Pain: no pain reported during session     Therapy/Group: Individual Therapy  Ronal Mallie Needy 12/15/2023, 12:14 PM

## 2023-12-16 MED ORDER — BISACODYL 10 MG RE SUPP
10.0000 mg | Freq: Once | RECTAL | Status: DC
  Filled 2023-12-16: qty 1

## 2023-12-16 NOTE — Progress Notes (Signed)
 Inpatient Rehabilitation Care Coordinator Discharge Note   Patient Details  Name: [name] [name] MRN: [id number] Date of Birth: [birth year]   Discharge location: Home with family support  Length of Stay: 10 days  Discharge activity level: Minimal Assistance  Home/community participation: Homebound  Patient response un:Yzjouy Literacy - How often do you need to have someone help you when you read instructions, pamphlets, or other written material from your doctor or pharmacy?: Patient unable to respond  Patient response un:Dnrpjo Isolation - How often do you feel lonely or isolated from those around you?: Patient unable to respond  Services provided included: MD, RD, PT, OT, SLP, RN, CM, TR, Pharmacy, SW  Financial Services:  Field seismologist Utilized: Media planner ([employer]) [facility] Medicare  Choices offered to/list presented to: Daughter  Follow-up services arranged:  Home Health Home Health Agency: [facility]         Patient response to transportation need: Is the patient able to respond to transportation needs?: Yes In the past 12 months, has lack of transportation kept you from medical appointments or from getting medications?: No In the past 12 months, has lack of transportation kept you from meetings, work, or from getting things needed for daily living?: No   Patient/Family verbalized understanding of follow-up arrangements:  Yes  Individual responsible for coordination of the follow-up plan: Joen Cook - [phone]  Confirmed correct DME delivered: [employer]  Loreli 12/16/2023    Comments (or additional information):Family education completed on 8/22 with daughter Joen. Caregiver resources provided. Note signed by PA for patient continuous care provided.   Summary of Stay    Date/Time Discharge Planning CSW  12/09/23 1106 Discharge home with 24/7 intermittent care from family/friends until 35 hours of caregiver suport is secured through insurance. Awaiting  follow up recommendations from PT/OT. DS       [employer]  Loreli

## 2023-12-16 NOTE — Discharge Instr - Supplementary Instructions (Signed)
 COMMUNITY REFERRALS UPON DISCHARGE:    Home Health:   PT  OT                    Agency: [facility] Phone: [phone]   Medical Equipment/Items Ordered: 3-1 COMMODE                                                 Agency/Supplier: [facility]

## 2023-12-16 NOTE — Progress Notes (Signed)
 PROGRESS NOTE   Subjective/Complaints: Daughter is concerned about discharge as she works full time, she does not want patient to go to nursing home No issues overnight  ROS: Patient denies fever, rash, sore throat, blurred vision, dizziness, nausea, vomiting, diarrhea, cough, shortness of breath or chest pain, joint or back/neck pain, headache, or mood change.  +constipation - resovled  Objective:   No results found. No results for input(s): WBC, HGB, HCT, PLT in the last 72 hours.  No results for input(s): NA, K, CL, CO2, GLUCOSE, BUN, CREATININE, CALCIUM  in the last 72 hours.   Intake/Output Summary (Last 24 hours) at 12/16/2023 0905 Last data filed at 12/15/2023 1830 Gross per 24 hour  Intake 720 ml  Output --  Net 720 ml        Physical Exam: Vital Signs Blood pressure (!) 145/80, pulse 70, temperature 98.5 F (36.9 C), resp. rate 17, height 5' 3 (1.6 m), weight 54.5 kg, SpO2 98%. Constitutional: No distress . Vital signs reviewed.  Sitting up in bedside chair. HEENT: NCAT, EOMI, oral membranes moist Neck: supple Cardiovascular: RRR without murmur. No JVD    Respiratory/Chest: CTA Bilaterally without wheezes or rales. Normal effort    GI/Abdomen: BS +, non-tender, non-distended Ext: no clubbing, cyanosis, hands well perfused but cool. Psych: pleasant and cooperative  Skin: intact Musculoskeletal:        General: No swelling or tenderness. Normal range of motion.     Cervical back: Normal range of motion.  Skin:    General: Skin is warm and dry.     Comments: Bilateral TKA scars  Neurological:     Mental Status: She is alert.     Comments: Awake, alert, and oriented x 3. + Speech delay, no appreciated word finding deficits.  100% intelligible.  MMT: RUE 4+ to 5/5 prox to distal. LLE 4 to 4+/5 prox to distal.  RLE 4/5 HF 4+ KE,  5/5 ADF/PF.  LLE 3/5 HF, 3/5 KE and 3-4/5  ADF/PF. Improved as above 8/26  Sensory exam normal for light touch and pain in all 4 limbs.  No limb ataxia or cerebellar signs.  No abnormal tone appreciated    Assessment/Plan: 1. Functional deficits which require 3+ hours per day of interdisciplinary therapy in a comprehensive inpatient rehab setting. Physiatrist is providing close team supervision and 24 hour management of active medical problems listed below. Physiatrist and rehab team continue to assess barriers to discharge/monitor patient progress toward functional and medical goals  Care Tool:  Bathing    Body parts bathed by patient: Right arm, Left arm, Abdomen, Right upper leg, Left upper leg, Face, Chest, Buttocks, Front perineal area, Right lower leg, Left lower leg   Body parts bathed by helper: Buttocks, Front perineal area, Right lower leg, Left lower leg, Left upper leg, Right upper leg Body parts n/a: Right lower leg, Left lower leg   Bathing assist Assist Level: Contact Guard/Touching assist     Upper Body Dressing/Undressing Upper body dressing   What is the patient wearing?: Bra, Pull over shirt    Upper body assist Assist Level: Minimal Assistance - Patient > 75%    Lower Body Dressing/Undressing Lower  body dressing      What is the patient wearing?: Underwear/pull up, Pants     Lower body assist Assist for lower body dressing: Minimal Assistance - Patient > 75%     Toileting Toileting    Toileting assist Assist for toileting: Minimal Assistance - Patient > 75%     Transfers Chair/bed transfer  Transfers assist     Chair/bed transfer assist level: Contact Guard/Touching assist     Locomotion Ambulation   Ambulation assist   Ambulation activity did not occur: Safety/medical concerns  Assist level: Contact Guard/Touching assist Assistive device: Walker-rolling Max distance: 180'   Walk 10 feet activity   Assist  Walk 10 feet activity did not occur: Safety/medical  concerns  Assist level: Contact Guard/Touching assist Assistive device: Walker-rolling   Walk 50 feet activity   Assist Walk 50 feet with 2 turns activity did not occur: Safety/medical concerns  Assist level: Contact Guard/Touching assist Assistive device: Walker-rolling    Walk 150 feet activity   Assist Walk 150 feet activity did not occur: Safety/medical concerns  Assist level: Contact Guard/Touching assist Assistive device: Walker-rolling    Walk 10 feet on uneven surface  activity   Assist Walk 10 feet on uneven surfaces activity did not occur: Safety/medical concerns         Wheelchair     Assist Is the patient using a wheelchair?: Yes Type of Wheelchair: Manual    Wheelchair assist level: Dependent - Patient 0% Max wheelchair distance: 175'    Wheelchair 50 feet with 2 turns activity    Assist        Assist Level: Dependent - Patient 0%   Wheelchair 150 feet activity     Assist      Assist Level: Dependent - Patient 0%   Blood pressure (!) 145/80, pulse 70, temperature 98.5 F (36.9 C), resp. rate 17, height 5' 3 (1.6 m), weight 54.5 kg, SpO2 98%.  Medical Problem List and Plan: 1. 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  Antithrombotics: -DVT/anticoagulation:  Pharmaceutical: d/c heparin  upon discharge, SCDs ordered             -antiplatelet therapy: N/A  3. Pain Management: Tylenol  as needed  4. Mood/Behavior/Sleep: continue Cymbalta  60 mg daily, Namenda  10  mg twice daily             -antipsychotic agents: N/A  5. Neuropsych/cognition: This patient is not capable of making decisions on her own behalf.   - DC telesitter, not needed  6. Constipation: last BM 8/19, decrease regimen to senna 2 tabs HS, mag gluconate 500mg  daily added, d/c colace, messaged nursing to confirm date of last BM  Continue miralax   7. Fluids/Electrolytes/Nutrition: Routine in and outs with follow-up chemistries on Monday  8.  Hypertension.  Continue Norvasc  10 mg daily, Toprol -XL 12.5 mg daily.  Monitor with increased mobility, magnesium  supplement started- continue 500mg   - 8-23: BP stable  9.  Hyperlipidemia:  continue Crestor   10. Likely peripheral arterial disease: would benefit from outpatient vascular follow-up, may benefit from outpatient Qutenza, d/c teds as worsens symptoms of cold feet  11. Fatigue: D3 increased to 2,000U daily, continue  12. AKI: asked SLP to work with her on a daily water  chart, discussed that creatinine has improved, will d/c IVF  Creatinine stable 1.02, BUN 25.  Continue current regimen.  13. UTI: keflex  started, UC shows E coli sensitive to Keflex   - 8-23: Continues incontinent of urine.  Initiate timed toileting, PVRs  Continue to monitor through use of antibiotics.  14. Impaired initiation: amantadine  100mg  started daily, continue   >30 minutes spent in discharge of patient including review of medications and follow-up appointments, physical examination, and in answering all patient's questions   LOS: 11 days A FACE TO FACE EVALUATION WAS PERFORMED  [name] [name] P [name] [name] 12/16/2023, 9:05 AM

## 2023-12-18 DIAGNOSIS — G309 Alzheimer's disease, unspecified: Secondary | ICD-10-CM | POA: Diagnosis not present

## 2023-12-18 DIAGNOSIS — D631 Anemia in chronic kidney disease: Secondary | ICD-10-CM | POA: Diagnosis not present

## 2023-12-18 DIAGNOSIS — I2510 Atherosclerotic heart disease of native coronary artery without angina pectoris: Secondary | ICD-10-CM | POA: Diagnosis not present

## 2023-12-18 DIAGNOSIS — I671 Cerebral aneurysm, nonruptured: Secondary | ICD-10-CM | POA: Diagnosis not present

## 2023-12-18 DIAGNOSIS — F32A Depression, unspecified: Secondary | ICD-10-CM | POA: Diagnosis not present

## 2023-12-18 DIAGNOSIS — F0280 Dementia in other diseases classified elsewhere without behavioral disturbance: Secondary | ICD-10-CM | POA: Diagnosis not present

## 2023-12-18 DIAGNOSIS — N189 Chronic kidney disease, unspecified: Secondary | ICD-10-CM | POA: Diagnosis not present

## 2023-12-18 DIAGNOSIS — I129 Hypertensive chronic kidney disease with stage 1 through stage 4 chronic kidney disease, or unspecified chronic kidney disease: Secondary | ICD-10-CM | POA: Diagnosis not present

## 2023-12-18 DIAGNOSIS — I619 Nontraumatic intracerebral hemorrhage, unspecified: Secondary | ICD-10-CM | POA: Diagnosis not present

## 2023-12-23 ENCOUNTER — Telehealth: Payer: Self-pay

## 2023-12-23 NOTE — Telephone Encounter (Signed)
 Copied from CRM #[id number]. Topic: Clinical - Home Health Verbal Orders >> Dec 19, 2023  3:15 PM Robinson H wrote: Caller/Agency: [facility] Callback Number: [phone] Secure line Service Requested: Physical Therapy Frequency: 1 week 9 Any new concerns about the patient? No

## 2023-12-24 ENCOUNTER — Telehealth: Payer: Self-pay

## 2023-12-24 DIAGNOSIS — F32A Depression, unspecified: Secondary | ICD-10-CM | POA: Diagnosis not present

## 2023-12-24 DIAGNOSIS — F0280 Dementia in other diseases classified elsewhere without behavioral disturbance: Secondary | ICD-10-CM | POA: Diagnosis not present

## 2023-12-24 DIAGNOSIS — I129 Hypertensive chronic kidney disease with stage 1 through stage 4 chronic kidney disease, or unspecified chronic kidney disease: Secondary | ICD-10-CM | POA: Diagnosis not present

## 2023-12-24 DIAGNOSIS — N189 Chronic kidney disease, unspecified: Secondary | ICD-10-CM | POA: Diagnosis not present

## 2023-12-24 DIAGNOSIS — I2510 Atherosclerotic heart disease of native coronary artery without angina pectoris: Secondary | ICD-10-CM | POA: Diagnosis not present

## 2023-12-24 DIAGNOSIS — I619 Nontraumatic intracerebral hemorrhage, unspecified: Secondary | ICD-10-CM | POA: Diagnosis not present

## 2023-12-24 DIAGNOSIS — D631 Anemia in chronic kidney disease: Secondary | ICD-10-CM | POA: Diagnosis not present

## 2023-12-24 DIAGNOSIS — I671 Cerebral aneurysm, nonruptured: Secondary | ICD-10-CM | POA: Diagnosis not present

## 2023-12-24 DIAGNOSIS — G309 Alzheimer's disease, unspecified: Secondary | ICD-10-CM | POA: Diagnosis not present

## 2023-12-24 NOTE — Telephone Encounter (Signed)
 She has a follow up  appt with neurology and not with me .  So  either forward to have rehab or neurology  ok orders   or  if I am responsible for  home health orders  , she will need to have a face to face visit in the next  month with me (last note /visit with me was  in 2024 )

## 2023-12-24 NOTE — Telephone Encounter (Unsigned)
 Copied from CRM #[id number]. Topic: Clinical - Home Health Verbal Orders >> Dec 24, 2023 11:39 AM Chasity T wrote: Caller/Agency: [facility] Callback Number: [phone] Service Requested: Occupational Therapy, Social Work Consult Frequency: OT (1x8 weeks) , Social Work (1 week 1) Any new concerns about the patient? No

## 2023-12-24 NOTE — Telephone Encounter (Signed)
 See previous  message   I have not seen her in a year and would refer to neurology or rehab to  do orders    She would need a face to face with me if I am to do HH orders.

## 2023-12-25 NOTE — Telephone Encounter (Signed)
 Attempted to reach pt. Left a voicemail to call us back.

## 2023-12-30 ENCOUNTER — Encounter: Admitting: Registered Nurse

## 2023-12-30 NOTE — Telephone Encounter (Signed)
 Please see other encounter.

## 2023-12-31 ENCOUNTER — Ambulatory Visit: Admitting: Physician Assistant

## 2023-12-31 DIAGNOSIS — D631 Anemia in chronic kidney disease: Secondary | ICD-10-CM | POA: Diagnosis not present

## 2023-12-31 DIAGNOSIS — I2510 Atherosclerotic heart disease of native coronary artery without angina pectoris: Secondary | ICD-10-CM | POA: Diagnosis not present

## 2023-12-31 DIAGNOSIS — G309 Alzheimer's disease, unspecified: Secondary | ICD-10-CM | POA: Diagnosis not present

## 2023-12-31 DIAGNOSIS — I129 Hypertensive chronic kidney disease with stage 1 through stage 4 chronic kidney disease, or unspecified chronic kidney disease: Secondary | ICD-10-CM | POA: Diagnosis not present

## 2023-12-31 DIAGNOSIS — I671 Cerebral aneurysm, nonruptured: Secondary | ICD-10-CM | POA: Diagnosis not present

## 2023-12-31 DIAGNOSIS — I619 Nontraumatic intracerebral hemorrhage, unspecified: Secondary | ICD-10-CM | POA: Diagnosis not present

## 2023-12-31 DIAGNOSIS — F32A Depression, unspecified: Secondary | ICD-10-CM | POA: Diagnosis not present

## 2023-12-31 DIAGNOSIS — N189 Chronic kidney disease, unspecified: Secondary | ICD-10-CM | POA: Diagnosis not present

## 2023-12-31 DIAGNOSIS — F0280 Dementia in other diseases classified elsewhere without behavioral disturbance: Secondary | ICD-10-CM | POA: Diagnosis not present

## 2024-01-04 NOTE — Progress Notes (Signed)
 [facility] Neurology [facility] Clinic Note - Progress Note   Date: 01/10/24  JAIDEE STIPE MRN: [id number] DOB: 1940-06-29     History of Present Illness:  [name] [name] is a 83 y.o. R-handed female with  presenting in follow up after sustaining hemorrhagic stroke on 11/2023. On 12/02/2023 she was brought to the ED  with greater than 1 week history of confusion (LKW 11/22/23), difficulty with speech and difficulty with ambulation stumbling when walking. Daughter initially attributed these symptoms to dementia, but as they persisted, she brought her for further workup.   At the ED, CT head without contrast personally reviewed, revealed a R frontal hemorrhage measuring up to 3.6 cm with mild mass effect and effacement of the overlying sulci. No midline shift.  She had embolization coils in the region of the basilar tip.  MRI confirmed 2 mm acute/subacute intraxial hemorrhage in the R hemisphere with underlying microhemorrhages in the l parietal and occipital lobes, somewhat increased from prior MRI 2024, mild associated cerebral edema without significant intracranial mass effect.  MRA head and neck without LVO.  Neurology evaluated her. Etiology was unclear. She was not on [place], and she had not sustained any trauma. She was mildly hypertensive.  She was sent to Rehab on 8/15-8/26.  Patient  never had a similar episode. Denies any history of TIA. Denies vertigo dizziness or vision changes. Denies headaches, or dysphagia.  Denies any chest pain, or shortness of breath. Denies any fever or chills, or night sweats. No tobacco. No new meds or hormonal supplements.  Denies any recent long distance trips or recent surgeries. No sick contacts. No new stressors present in personal life Patient is compliant with medications. No family history of stroke.  Remains sedentary.   Daughter reports that symptoms improved although she  needs assistance with dressing up and other ADLs. She has to use a walker or  the wheelchair due to LLE weakness and shuffling.     Eating is more difficult because she has less coordination, very slow, getting food on the fork tot the mouth is a struggle  after the stroke. Memory is stable.  She is alone only at night with her daughter supervision during the day   Out-side paper records, electronic medical record, and images have been reviewed where available and summarized as:    CTA head & neck no acute intracranial hemorrhage or ischemic change.  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  Moderate to severe focal stenosis at the junction of the left P1 and P2 segment, associated on the previous MRI.  Incidental nodule within the right lobe of thyroid  11 x 8 mm. MRI 2 acute/subacute intra-axial hemorrhages in the right hemisphere-known superior frontal gyrus hematoma, 13 mm stable since CT yesterday, and a smaller right inferior frontal gyrus hematoma, 5 mm, which is obscured on CT but basilar tip coil pack artifact.  Underlying microhemorrhages in the left parietal frontal lobes appear increased from MRI last year. MRA treated basilar tip aneurysm with a small 2 mm area of possible pleural compaction along the base of the aneurysm.  No LVO.  But positive for moderate to severe stenosis of the left PCA P1/P2 junction. 2D Echo LVEF 60 to 70%, no intracardiac source of embolism detected LDL 108  Lab Results  Component Value Date   HGBA1C 5.2 12/02/2023      Past Medical History:  Diagnosis Date   Acute kidney failure (HCC)    5 years ago   ACUTE  KIDNEY FAILURE UNSPECIFIED 07/12/2009   Allergy    Anemia    Anxiety    Arthritis    Basal cell carcinoma    Blood clot in vein    states it was not deep vein   CAD (coronary artery disease)    Mild nonobstructive by CT   Cataract    Cerebral aneurysm without rupture 2021   Chronic kidney disease    stones   Depression    around the time of her husband's death   Diverticulosis of colon (without  mention of hemorrhage)    Fibromyalgia    GERD (gastroesophageal reflux disease)    Headache    Hiatal hernia    History of kidney stones    Hyperlipidemia    Hypertension    Interstitial cystitis    Kidney stone    Osteopenia    PONV (postoperative nausea and vomiting)    Scoliosis    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   Qualifier: Diagnosis of  By: Jakie MD NOLIA Alm SAUNDERS    Vertigo     Past Surgical History:  Procedure Laterality Date   BLADDER TUMOR EXCISION  2001   CATARACT EXTRACTION     bilateral   CHOLECYSTECTOMY     COLOSTOMY     CYSTECTOMY  2001   benign   ESOPHAGEAL MANOMETRY N/A 07/13/2012   Procedure: ESOPHAGEAL MANOMETRY (EM);  Surgeon: Alm SAUNDERS Jakie, MD;  Location: [facility];  Service: Endoscopy;  Laterality: N/A;   HIATAL HERNIA REPAIR     IR ANGIO INTRA EXTRACRAN SEL INTERNAL CAROTID BILAT MOD SED  11/02/2019   IR ANGIO INTRA EXTRACRAN SEL INTERNAL CAROTID BILAT MOD SED  10/31/2020   IR ANGIO VERTEBRAL SEL VERTEBRAL BILAT MOD SED  12/17/2019   IR ANGIO VERTEBRAL SEL VERTEBRAL UNI L MOD SED  10/31/2020   IR ANGIO VERTEBRAL SEL VERTEBRAL UNI R MOD SED  11/02/2019   IR ANGIOGRAM FOLLOW UP STUDY  12/17/2019   IR ANGIOGRAM FOLLOW UP STUDY  12/17/2019   IR ANGIOGRAM FOLLOW UP STUDY  12/17/2019   IR ANGIOGRAM FOLLOW UP STUDY  12/17/2019   IR NEURO EACH ADD'L AFTER BASIC UNI LEFT (MS)  12/17/2019   IR TRANSCATH/EMBOLIZ  12/17/2019   LUMBAR DISC SURGERY     x 2 elsner   RADIOLOGY WITH ANESTHESIA N/A 12/17/2019   Procedure: 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;  Surgeon: Lanis Pupa, MD;  Location: [facility] OR;  Service: Radiology;  Laterality: N/A;   REVERSE SHOULDER ARTHROPLASTY Right 08/12/2014   Procedure: RIGHT REVERSE TOTAL SHOULDER ARTHROPLASTY;  Surgeon: Marcey Her, MD;  Location[facility] OR;  Service: Orthopedics;  Laterality: Right;   TENDON REPAIR Right 11/2005   right shoulder   TOTAL KNEE ARTHROPLASTY Left    left    TOTAL KNEE ARTHROPLASTY Right 11/17/2015   Procedure: RIGHT TOTAL KNEE ARTHROPLASTY;  Surgeon: Lamar Collet, MD;  Location: [facility] ORS;  Service: Orthopedics;  Laterality: Right;   UPPER GASTROINTESTINAL ENDOSCOPY       Medications:  Outpatient Encounter Medications as of 01/07/2024  Medication Sig   acetaminophen  (TYLENOL ) 325 MG tablet Take 2 tablets (650 mg total) by mouth every 4 (four) hours as needed for mild pain (pain score 1-3) or fever (or temp > 37.5 C (99.5 F)).   amLODipine  (NORVASC ) 10 MG tablet Take 1 tablet (10 mg total) by mouth daily.   docusate sodium  (COLACE) 100 MG capsule Take 1 capsule (100 mg total) by mouth daily.   DULoxetine  (CYMBALTA )  60 MG capsule Take 1 capsule (60 mg total) by mouth 2 (two) times daily. (Patient taking differently: Take 60 mg by mouth daily.)   magnesium  gluconate (MAGONATE) 500 (27 Mg) MG TABS tablet Take 1 tablet (500 mg total) by mouth daily.   memantine  (NAMENDA ) 10 MG tablet Take 1 tablet (10 mg total) by mouth 2 (two) times daily.   metoprolol  succinate (TOPROL -XL) 25 MG 24 hr tablet Take 0.5 tablets (12.5 mg total) by mouth daily.   polyethylene glycol (MIRALAX  / GLYCOLAX ) 17 g packet Take 17 g by mouth daily. (Patient taking differently: Take 17 g by mouth daily as needed.)   rosuvastatin  (CRESTOR ) 20 MG tablet Take 1 tablet (20 mg total) by mouth daily.   vitamin D3 (CHOLECALCIFEROL ) 25 MCG tablet Take 2 tablets (2,000 Units total) by mouth daily.   [DISCONTINUED] cephALEXin  (KEFLEX ) 500 MG capsule Take 1 capsule (500 mg total) by mouth every 12 (twelve) hours.   No facility-administered encounter medications on file as of 01/07/2024.    Allergies:  Allergies  Allergen Reactions   Gadolinium Derivatives Hives    PER DR DERRY PT NEEDS 13HOUR PREP BEFORE FUTURE CONTRASTED MRI'S//KMS   Aspirin  Other (See Comments)    Acute renal failure    Lactose Intolerance (Gi) Other (See Comments)    Gi upset   Meloxicam     REACTION: acute renal  failure  with hypovolemic trigger Hospital  3 2011 Pt states allergic to all NSAIDS    Metoprolol      No issues with Metoprolol  extended release   Sulfamethoxazole Hives    Family History: Family History  Problem Relation Age of Onset   Alzheimer's disease Mother    Lung cancer Father        tobacco   Kidney cancer Son    Stroke Son    Arthritis Son    Hypertension Brother        mom's side   Osteoporosis Sister    Hypertension Sister    Breast cancer Cousin    Heart disease Maternal Uncle        x 4   Heart disease Maternal Aunt        x 4   Diabetes Paternal Grandmother    Diabetes Paternal Uncle        x 2   Arthritis Daughter    Colon cancer Neg Hx     Social History: Social History   Tobacco Use   Smoking status: Never   Smokeless tobacco: Never  Vaping Use   Vaping status: Never Used  Substance Use Topics   Alcohol use: Not Currently    Alcohol/week: 0.0 standard drinks of alcohol    Comment: rare   Drug use: No   Social History   Social History Narrative   Widowed 10/09 husband scd   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   Right handed   Lives alone   Daughter sherry    Vital Signs:  BP 100/61   Pulse 74   Resp 20   Ht 5' 3 (1.6 m)   SpO2 97%   BMI 21.89 kg/m    General Medical Exam:   General:  Well appearing, comfortable.   Eyes/ENT: see cranial nerve examination.   Neck:   No carotid bruits. Respiratory:  Clear to auscultation, good air entry bilaterally.   Cardiac:  Regular rate and rhythm, no murmur.   Extremities:  No deformities, edema, or skin discoloration.  Skin:  No  rashes or lesions.  Neurological Exam:   CRANIAL NERVES: II:  No visual field defects.  Unremarkable fundi.   III-IV-VI: Pupils equal round and reactive to light.  Normal conjugate, extra-ocular eye movements in all directions of gaze.  No nystagmus.  No ptosis.   V:  Normal facial sensation.    VII:  Normal movements.  Mild R facial  droop VIII:  Normal hearing and vestibular function.   IX-X:  Normal palatal movement.   XI:  Normal shoulder shrug and head rotation.   XII:  Normal tongue strength and range of motion, no deviation or fasciculation.  MOTOR:  No atrophy, fasciculations or abnormal movements.  No pronator drift. LLE 3/5 strength. BUE 4/5   SENSORY:  Normal and symmetric perception of light touch, pinprick, vibration, and proprioception.  Romberg's sign unable to test   COORDINATION/GAIT: Normal finger-to- nose-finger  Intact rapid alternating movements bilaterally.   Gait narrow based but unstable short stride, no ataxia.    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   Recommend good control of BP Continue Memantine  10 mg twice daily. Side effects were discussed    Total time spent:  52 min   Thank you for allowing me to participate in patient's care.  If I can answer any additional questions, I would be pleased to do so.    Sincerely,   Camie Sevin, PA-C

## 2024-01-06 ENCOUNTER — Encounter: Payer: Self-pay | Admitting: Internal Medicine

## 2024-01-06 ENCOUNTER — Ambulatory Visit (INDEPENDENT_AMBULATORY_CARE_PROVIDER_SITE_OTHER): Admitting: Internal Medicine

## 2024-01-06 VITALS — BP 118/64 | HR 58 | Temp 98.5°F | Ht 63.0 in | Wt 123.6 lb

## 2024-01-06 DIAGNOSIS — Z23 Encounter for immunization: Secondary | ICD-10-CM | POA: Diagnosis not present

## 2024-01-06 DIAGNOSIS — F02B0 Dementia in other diseases classified elsewhere, moderate, without behavioral disturbance, psychotic disturbance, mood disturbance, and anxiety: Secondary | ICD-10-CM | POA: Diagnosis not present

## 2024-01-06 DIAGNOSIS — N183 Chronic kidney disease, stage 3 unspecified: Secondary | ICD-10-CM

## 2024-01-06 DIAGNOSIS — Z79899 Other long term (current) drug therapy: Secondary | ICD-10-CM

## 2024-01-06 DIAGNOSIS — R413 Other amnesia: Secondary | ICD-10-CM

## 2024-01-06 DIAGNOSIS — I10 Essential (primary) hypertension: Secondary | ICD-10-CM

## 2024-01-06 DIAGNOSIS — Z8679 Personal history of other diseases of the circulatory system: Secondary | ICD-10-CM

## 2024-01-06 DIAGNOSIS — G309 Alzheimer's disease, unspecified: Secondary | ICD-10-CM | POA: Diagnosis not present

## 2024-01-06 DIAGNOSIS — Z09 Encounter for follow-up examination after completed treatment for conditions other than malignant neoplasm: Secondary | ICD-10-CM | POA: Diagnosis not present

## 2024-01-06 NOTE — Progress Notes (Signed)
 Chief Complaint  Patient presents with   Hospitalization Follow-up    Pt is here with daughter, Reena. Daughter reports she has improved but still some weakness on left leg.     HPI: [name] [name] 83 y.o. come in for Chronic disease management  Post  hosp  for spontaneous   ich. And left leg weakness  with underlying memory alzheimer like  sx  And 8 days of rehab.  Eating weill as long as food prepared for her  Since discharge   does  help getting up  and feed.   Go at  night and helps to bed.   Lives alone .  But checked on 3 x per day  food and arising and bedtime doesn't wander at night   To get ring cameras.  For voice and camera surveillance also  Daughter  controls all the meds  and is hc an poa   ? Fell or slumped on carpet bedroom  2 weeks ago ? No sx  No new complaints    ROS: See pertinent positives and negatives per HPI. No current cp sob swelling bleeding   Past Medical History:  Diagnosis Date   Acute kidney failure (HCC)    5 years ago   ACUTE KIDNEY FAILURE UNSPECIFIED 07/12/2009   Allergy    Anemia    Anxiety    Arthritis    Basal cell carcinoma    Blood clot in vein    states it was not deep vein   CAD (coronary artery disease)    Mild nonobstructive by CT   Cataract    Cerebral aneurysm without rupture 2021   Chronic kidney disease    stones   Depression    around the time of her husband's death   Diverticulosis of colon (without mention of hemorrhage)    Fibromyalgia    GERD (gastroesophageal reflux disease)    Headache    Hiatal hernia    History of kidney stones    Hyperlipidemia    Hypertension    Interstitial cystitis    Kidney stone    Osteopenia    PONV (postoperative nausea and vomiting)    Scoliosis    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   Qualifier: Diagnosis of  By: Jakie MD NOLIA Alm SAUNDERS    Vertigo     Family History  Problem Relation Age of Onset   Alzheimer's disease Mother    Lung cancer Father        tobacco   Kidney  cancer Son    Stroke Son    Arthritis Son    Hypertension Brother        mom's side   Osteoporosis Sister    Hypertension Sister    Breast cancer Cousin    Heart disease Maternal Uncle        x 4   Heart disease Maternal Aunt        x 4   Diabetes Paternal Grandmother    Diabetes Paternal Uncle        x 2   Arthritis Daughter    Colon cancer Neg Hx     Social History   Socioeconomic History   Marital status: Widowed    Spouse name: Not on file   Number of children: 2   Years of education: 63   Highest education level: Not on file  Occupational History   Occupation: Retired   Tobacco Use   Smoking status: Never   Smokeless tobacco: Never  Vaping Use   Vaping status: Never Used  Substance and Sexual Activity   Alcohol use: Not Currently    Alcohol/week: 0.0 standard drinks of alcohol    Comment: rare   Drug use: No   Sexual activity: Not on file  Other Topics Concern   Not on file  Social History Narrative   Widowed 10/09 husband scd   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   Right handed   Lives alone   Daughter sherry   Social Drivers of Health   Financial Resource Strain: Low Risk  (06/26/2021)   Overall Financial Resource Strain (CARDIA)    Difficulty of Paying Living Expenses: Not hard at all  Food Insecurity: No Food Insecurity (12/02/2023)   Hunger Vital Sign    Worried About Running Out of Food in the Last Year: Never true    Ran Out of Food in the Last Year: Never true  Transportation Needs: No Transportation Needs (12/02/2023)   PRAPARE - Administrator, Civil Service (Medical): No    Lack of Transportation (Non-Medical): No  Physical Activity: Inactive (06/26/2021)   Exercise Vital Sign    Days of Exercise per Week: 0 days    Minutes of Exercise per Session: 0 min  Stress: No Stress Concern Present (06/26/2021)   [employer] of Occupational Health - Occupational Stress Questionnaire    Feeling of Stress : Not at all   Social Connections: Moderately Integrated (12/02/2023)   Social Connection and Isolation Panel    Frequency of Communication with Friends and Family: More than three times a week    Frequency of Social Gatherings with Friends and Family: More than three times a week    Attends Religious Services: 1 to 4 times per year    Active Member of [employer] or Organizations: Yes    Attends Banker Meetings: 1 to 4 times per year    Marital Status: Widowed    Outpatient Medications Prior to Visit  Medication Sig Dispense Refill   acetaminophen  (TYLENOL ) 325 MG tablet Take 2 tablets (650 mg total) by mouth every 4 (four) hours as needed for mild pain (pain score 1-3) or fever (or temp > 37.5 C (99.5 F)).     amLODipine  (NORVASC ) 10 MG tablet Take 1 tablet (10 mg total) by mouth daily. 30 tablet 0   DULoxetine  (CYMBALTA ) 60 MG capsule Take 1 capsule (60 mg total) by mouth 2 (two) times daily. (Patient taking differently: Take 60 mg by mouth daily.) 60 capsule 0   magnesium  gluconate (MAGONATE) 500 (27 Mg) MG TABS tablet Take 1 tablet (500 mg total) by mouth daily. 30 tablet 0   memantine  (NAMENDA ) 10 MG tablet Take 1 tablet (10 mg total) by mouth 2 (two) times daily. 60 tablet 0   metoprolol  succinate (TOPROL -XL) 25 MG 24 hr tablet Take 0.5 tablets (12.5 mg total) by mouth daily. 30 tablet 0   polyethylene glycol (MIRALAX  / GLYCOLAX ) 17 g packet Take 17 g by mouth daily. (Patient taking differently: Take 17 g by mouth daily as needed.)     rosuvastatin  (CRESTOR ) 20 MG tablet Take 1 tablet (20 mg total) by mouth daily. 30 tablet 0   vitamin D3 (CHOLECALCIFEROL ) 25 MCG tablet Take 2 tablets (2,000 Units total) by mouth daily. 30 tablet 0   docusate sodium  (COLACE) 100 MG capsule Take 1 capsule (100 mg total) by mouth daily. (Patient not taking: Reported on 01/06/2024)     cephALEXin  (  KEFLEX ) 500 MG capsule Take 1 capsule (500 mg total) by mouth every 12 (twelve) hours. 4 capsule 0   No  facility-administered medications prior to visit.     EXAM:  BP 118/64 (BP Location: Left Arm, Patient Position: Sitting, Cuff Size: Normal)   Pulse (!) 58   Temp 98.5 F (36.9 C) (Oral)   Ht 5' 3 (1.6 m)   Wt 123 lb 9.6 oz (56.1 kg)   SpO2 96%   BMI 21.89 kg/m   Body mass index is 21.89 kg/m.  GENERAL: vitals reviewed and listed above, alert appears well hydrated and in no acute distress elderly alert  interactive  daughter  involved hx  HEENT: atraumatic, conjunctiva  clear, no obvious abnormalities on inspection of external nose and ears uses walker  well but slow gingerly up and down   minl facial assymmetry NECK: no obvious masses on inspection palpation  LUNGS: clear to auscultation bilaterally, no wheezes, rales or rhonchi, good air movement CV: HRRR, no clubbing cyanosis or  peripheral edema nl cap refill  MS: moves all extremities without noticeable focal  abnormality PSYCH: pleasant and cooperative,  memory not tested  Lab Results  Component Value Date   WBC 6.2 12/10/2023   HGB 14.3 12/10/2023   HCT 45.1 12/10/2023   PLT 222 12/10/2023   GLUCOSE 81 12/12/2023   CHOL 182 12/02/2023   TRIG 71 12/02/2023   HDL 60 12/02/2023   LDLDIRECT 179.1 10/09/2007   LDLCALC 108 (H) 12/02/2023   ALT 12 12/08/2023   AST 18 12/08/2023   NA 139 12/12/2023   K 4.7 12/12/2023   CL 112 (H) 12/12/2023   CREATININE 1.02 (H) 12/12/2023   BUN 25 (H) 12/12/2023   CO2 20 (L) 12/12/2023   TSH 1.35 10/16/2022   INR 1.0 10/31/2020   HGBA1C 5.2 12/02/2023   BP Readings from Last 3 Encounters:  01/06/24 118/64  12/16/23 (!) 145/80  12/05/23 (!) 131/49   Record review and  interview  ASSESSMENT AND PLAN:  Discussed the following assessment and plan:  Hospital discharge follow-up  Complaints of memory disturbance  Essential hypertension  Influenza vaccine needed - Plan: Flu vaccine HIGH DOSE PF(Fluzone Trivalent)  History of cerebral parenchymal hemorrhage  Stage 3  chronic kidney disease, unspecified whether stage 3a or 3b CKD (HCC)  Medication management  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  Do not know if this is a risk for microbleed ? Ok for home health plans   35 minutes record review and disc plan exam   -Patient advised to return or notify health care team  if  new concerns arise.  Patient Instructions  Good to see you today   Plan fu in 4-6 months or as needed. Flu vaccine today . Keep neurology appt as planned .    Bp is good today .  Charlei Ramsaran K. Shy Guallpa M.D.  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 coli  UTI

## 2024-01-06 NOTE — Patient Instructions (Addendum)
 Good to see you today   Plan fu in 4-6 months or as needed. Flu vaccine today . Keep neurology appt as planned .    Bp is good today .

## 2024-01-07 ENCOUNTER — Ambulatory Visit: Admitting: Physician Assistant

## 2024-01-07 ENCOUNTER — Encounter: Payer: Self-pay | Admitting: Physician Assistant

## 2024-01-07 VITALS — BP 100/61 | HR 74 | Resp 20 | Ht 63.0 in

## 2024-01-07 DIAGNOSIS — F03918 Unspecified dementia, unspecified severity, with other behavioral disturbance: Secondary | ICD-10-CM

## 2024-01-07 DIAGNOSIS — I619 Nontraumatic intracerebral hemorrhage, unspecified: Secondary | ICD-10-CM | POA: Diagnosis not present

## 2024-01-07 NOTE — Patient Instructions (Signed)
 4 months follow up  Monitor blood pressure  Try to do your physical activity more often

## 2024-01-13 ENCOUNTER — Ambulatory Visit: Admitting: Podiatry

## 2024-01-13 ENCOUNTER — Encounter: Payer: Self-pay | Admitting: Podiatry

## 2024-01-13 VITALS — Ht 63.0 in | Wt 123.6 lb

## 2024-01-13 DIAGNOSIS — M79674 Pain in right toe(s): Secondary | ICD-10-CM

## 2024-01-13 DIAGNOSIS — M79675 Pain in left toe(s): Secondary | ICD-10-CM

## 2024-01-13 DIAGNOSIS — B351 Tinea unguium: Secondary | ICD-10-CM

## 2024-01-14 NOTE — Progress Notes (Signed)
  Subjective:  Patient ID: [name] [name], female    DOB: 06-11-1940,  MRN: [id number]  [name] [name] presents to clinic today for: painful mycotic toenails of both feet that are difficult to trim. Pain interferes with daily activities and wearing enclosed shoe gear comfortably.  Chief Complaint  Patient presents with   Nail Problem    Rm 15 RFC. Pt is not diabetic. PCP- Dr. Apolinar Eastern, last visit 01/06/24.   PCP is Panosh, Apolinar POUR, MD.  Allergies  Allergen Reactions   Gadolinium Derivatives Hives    PER DR DERRY PT NEEDS 13HOUR PREP BEFORE FUTURE CONTRASTED MRI'S//KMS   Aspirin  Other (See Comments)    Acute renal failure    Lactose Intolerance (Gi) Other (See Comments)    Gi upset   Meloxicam     REACTION: acute renal failure  with hypovolemic trigger Hospital  3 2011 Pt states allergic to all NSAIDS    Metoprolol      No issues with Metoprolol  extended release   Sulfamethoxazole Hives    Review of Systems: Negative except as noted in the HPI.  Objective: No changes noted in today's physical examination. There were no vitals filed for this visit.  [name] [name] is a pleasant 83 y.o. female WD, WN in NAD. AAO x 3.  Vascular Examination: Capillary refill time <3 seconds b/l LE. Palpable pedal pulses b/l LE. Digital hair decreased b/l. No pedal edema b/l. Skin temperature gradient WNL b/l. No varicosities b/l. No cyanosis or clubbing. No ischemia or gangrene. .  Dermatological Examination: Pedal skin with normal turgor, texture and tone b/l. No open wounds. No interdigital macerations b/l. Toenails 1-5 b/l thickened, discolored, dystrophic with subungual debris. There is pain on palpation to dorsal aspect of nailplates. No corns, calluses, nor porokeratotic lesions.  Neurological Examination: Protective sensation intact with 10 gram monofilament b/l LE.  Musculoskeletal Examination: Muscle strength 5/5 to all lower extremity muscle groups bilaterally. No pain, crepitus or joint  limitation noted with ROM bilateral LE. Hammertoe(s) 2-5 b/l.     Latest Ref Rng & Units 12/02/2023   10:23 AM  Hemoglobin A1C  Hemoglobin-A1c 4.8 - 5.6 % 5.2    Assessment/Plan: 1. Pain due to onychomycosis of toenails of both feet   Patient was evaluated and treated. All patient's and/or POA's questions/concerns addressed on today's visit. Toenails 1-5 debrided in length and girth without incident. Continue soft, supportive shoe gear daily. Report any pedal injuries to medical professional. Call office if there are any questions/concerns. -Patient/POA to call should there be question/concern in the interim. Return in about 3 months (around 04/13/2024).  Delon LITTIE Merlin, DPM      [place] LOCATION: [address], [place] [address]                   Office [phone]   [facility] LOCATION: [address] [place], [place] [address] Office [phone]

## 2024-01-16 ENCOUNTER — Other Ambulatory Visit: Payer: Self-pay | Admitting: Internal Medicine

## 2024-01-16 ENCOUNTER — Telehealth: Payer: Self-pay | Admitting: Physician Assistant

## 2024-01-16 NOTE — Telephone Encounter (Unsigned)
 Copied from CRM [id number]. Topic: Clinical - Medication Refill >> Jan 16, 2024 12:06 PM Jasmin G wrote: Medication: amLODipine  (NORVASC ) 10 MG tablet, metoprolol  succinate (TOPROL -XL) 25 MG 24 hr tablet recently changed to 12.5 MG, rosuvastatin  (CRESTOR ) 20 MG tablet  Has the patient contacted their pharmacy? Yes (Agent: If no, request that the patient contact the pharmacy for the refill. If patient does not wish to contact the pharmacy document the reason why and proceed with request.) (Agent: If yes, when and what did the pharmacy advise?)  This is the patient's preferred pharmacy:  [id number] [place], [place] - [address] [place] [address] Phone: [phone] Fax: [phone]  Is this the correct pharmacy for this prescription? Yes If no, delete pharmacy and type the correct one.   Has the prescription been filled recently? Yes  Is the patient out of the medication? Yes  Has the patient been seen for an appointment in the last year OR does the patient have an upcoming appointment? Yes  Can we respond through MyChart? No  Agent: Please be advised that Rx refills may take up to 3 business days. We ask that you follow-up with your pharmacy.

## 2024-01-16 NOTE — Telephone Encounter (Signed)
 After hours Line- Pt.s daughter needs to reach [employer] about Pt./mom (no details provided)

## 2024-01-16 NOTE — Telephone Encounter (Signed)
 Joen POA will come see me Monday

## 2024-01-17 DIAGNOSIS — F0280 Dementia in other diseases classified elsewhere without behavioral disturbance: Secondary | ICD-10-CM | POA: Diagnosis not present

## 2024-01-17 DIAGNOSIS — F32A Depression, unspecified: Secondary | ICD-10-CM | POA: Diagnosis not present

## 2024-01-17 DIAGNOSIS — I671 Cerebral aneurysm, nonruptured: Secondary | ICD-10-CM | POA: Diagnosis not present

## 2024-01-17 DIAGNOSIS — I2510 Atherosclerotic heart disease of native coronary artery without angina pectoris: Secondary | ICD-10-CM | POA: Diagnosis not present

## 2024-01-17 DIAGNOSIS — D631 Anemia in chronic kidney disease: Secondary | ICD-10-CM | POA: Diagnosis not present

## 2024-01-17 DIAGNOSIS — G309 Alzheimer's disease, unspecified: Secondary | ICD-10-CM | POA: Diagnosis not present

## 2024-01-17 DIAGNOSIS — N189 Chronic kidney disease, unspecified: Secondary | ICD-10-CM | POA: Diagnosis not present

## 2024-01-17 DIAGNOSIS — I129 Hypertensive chronic kidney disease with stage 1 through stage 4 chronic kidney disease, or unspecified chronic kidney disease: Secondary | ICD-10-CM | POA: Diagnosis not present

## 2024-01-20 MED ORDER — AMLODIPINE BESYLATE 10 MG PO TABS: 10.0000 mg | ORAL_TABLET | Freq: Every day | ORAL | 1 refills | Status: AC

## 2024-01-20 MED ORDER — METOPROLOL SUCCINATE ER 25 MG PO TB24: 12.5000 mg | ORAL_TABLET | Freq: Every day | ORAL | 1 refills | Status: DC

## 2024-01-20 MED ORDER — ROSUVASTATIN CALCIUM 20 MG PO TABS: 20.0000 mg | ORAL_TABLET | Freq: Every day | ORAL | 1 refills | Status: AC

## 2024-01-20 NOTE — Telephone Encounter (Signed)
 Record review  she has been on metoprolol  xr 12.5  since dc from hospital  so not sure why mtoprolol is on allergy list.  Refill all for 90 days  x 2

## 2024-01-22 DIAGNOSIS — I671 Cerebral aneurysm, nonruptured: Secondary | ICD-10-CM | POA: Diagnosis not present

## 2024-01-22 DIAGNOSIS — F32A Depression, unspecified: Secondary | ICD-10-CM | POA: Diagnosis not present

## 2024-01-22 DIAGNOSIS — I129 Hypertensive chronic kidney disease with stage 1 through stage 4 chronic kidney disease, or unspecified chronic kidney disease: Secondary | ICD-10-CM | POA: Diagnosis not present

## 2024-01-22 DIAGNOSIS — N189 Chronic kidney disease, unspecified: Secondary | ICD-10-CM | POA: Diagnosis not present

## 2024-01-22 DIAGNOSIS — I619 Nontraumatic intracerebral hemorrhage, unspecified: Secondary | ICD-10-CM | POA: Diagnosis not present

## 2024-01-22 DIAGNOSIS — G309 Alzheimer's disease, unspecified: Secondary | ICD-10-CM | POA: Diagnosis not present

## 2024-01-22 DIAGNOSIS — I2510 Atherosclerotic heart disease of native coronary artery without angina pectoris: Secondary | ICD-10-CM | POA: Diagnosis not present

## 2024-01-22 DIAGNOSIS — F0280 Dementia in other diseases classified elsewhere without behavioral disturbance: Secondary | ICD-10-CM | POA: Diagnosis not present

## 2024-01-22 DIAGNOSIS — D631 Anemia in chronic kidney disease: Secondary | ICD-10-CM | POA: Diagnosis not present

## 2024-01-27 DIAGNOSIS — F419 Anxiety disorder, unspecified: Secondary | ICD-10-CM

## 2024-01-27 DIAGNOSIS — F32A Depression, unspecified: Secondary | ICD-10-CM | POA: Diagnosis not present

## 2024-01-27 DIAGNOSIS — B9620 Unspecified Escherichia coli [E. coli] as the cause of diseases classified elsewhere: Secondary | ICD-10-CM

## 2024-01-27 DIAGNOSIS — G309 Alzheimer's disease, unspecified: Secondary | ICD-10-CM | POA: Diagnosis not present

## 2024-01-27 DIAGNOSIS — N3010 Interstitial cystitis (chronic) without hematuria: Secondary | ICD-10-CM

## 2024-01-27 DIAGNOSIS — N189 Chronic kidney disease, unspecified: Secondary | ICD-10-CM | POA: Diagnosis not present

## 2024-01-27 DIAGNOSIS — F0280 Dementia in other diseases classified elsewhere without behavioral disturbance: Secondary | ICD-10-CM | POA: Diagnosis not present

## 2024-01-27 DIAGNOSIS — I129 Hypertensive chronic kidney disease with stage 1 through stage 4 chronic kidney disease, or unspecified chronic kidney disease: Secondary | ICD-10-CM | POA: Diagnosis not present

## 2024-01-27 DIAGNOSIS — D631 Anemia in chronic kidney disease: Secondary | ICD-10-CM | POA: Diagnosis not present

## 2024-01-27 DIAGNOSIS — I2510 Atherosclerotic heart disease of native coronary artery without angina pectoris: Secondary | ICD-10-CM | POA: Diagnosis not present

## 2024-01-27 DIAGNOSIS — I671 Cerebral aneurysm, nonruptured: Secondary | ICD-10-CM | POA: Diagnosis not present

## 2024-01-27 DIAGNOSIS — I619 Nontraumatic intracerebral hemorrhage, unspecified: Secondary | ICD-10-CM | POA: Diagnosis not present

## 2024-02-03 DIAGNOSIS — I2510 Atherosclerotic heart disease of native coronary artery without angina pectoris: Secondary | ICD-10-CM | POA: Diagnosis not present

## 2024-02-11 ENCOUNTER — Telehealth: Payer: Self-pay

## 2024-02-11 DIAGNOSIS — D631 Anemia in chronic kidney disease: Secondary | ICD-10-CM | POA: Diagnosis not present

## 2024-02-11 DIAGNOSIS — I619 Nontraumatic intracerebral hemorrhage, unspecified: Secondary | ICD-10-CM | POA: Diagnosis not present

## 2024-02-11 DIAGNOSIS — N189 Chronic kidney disease, unspecified: Secondary | ICD-10-CM | POA: Diagnosis not present

## 2024-02-11 DIAGNOSIS — I2510 Atherosclerotic heart disease of native coronary artery without angina pectoris: Secondary | ICD-10-CM | POA: Diagnosis not present

## 2024-02-11 DIAGNOSIS — F32A Depression, unspecified: Secondary | ICD-10-CM | POA: Diagnosis not present

## 2024-02-11 DIAGNOSIS — I129 Hypertensive chronic kidney disease with stage 1 through stage 4 chronic kidney disease, or unspecified chronic kidney disease: Secondary | ICD-10-CM | POA: Diagnosis not present

## 2024-02-11 DIAGNOSIS — G309 Alzheimer's disease, unspecified: Secondary | ICD-10-CM | POA: Diagnosis not present

## 2024-02-11 DIAGNOSIS — I671 Cerebral aneurysm, nonruptured: Secondary | ICD-10-CM | POA: Diagnosis not present

## 2024-02-11 DIAGNOSIS — F0280 Dementia in other diseases classified elsewhere without behavioral disturbance: Secondary | ICD-10-CM | POA: Diagnosis not present

## 2024-02-11 NOTE — Telephone Encounter (Signed)
 Copied from CRM #[id number]. Topic: Clinical - Home Health Verbal Orders >> Feb 11, 2024  1:38 PM Harlene ORN wrote: Caller/Agency: [facility]e [facility] Callback Number: [phone] Service Requested: Occupational Therapy Frequency: once a week for two weeks, then one time a week every other week for a total of eight weeks Any new concerns about the patient? No

## 2024-02-11 NOTE — Telephone Encounter (Signed)
Ok approved.

## 2024-02-12 DIAGNOSIS — G309 Alzheimer's disease, unspecified: Secondary | ICD-10-CM | POA: Diagnosis not present

## 2024-02-13 NOTE — Telephone Encounter (Signed)
 Attempted to reach [place]. Voicemail is full. Will try again.

## 2024-02-16 NOTE — Telephone Encounter (Signed)
 Contacted Signe and gave him approval for VO per provider. No further action is needed.

## 2024-04-13 ENCOUNTER — Other Ambulatory Visit: Payer: Self-pay | Admitting: Internal Medicine

## 2024-04-28 ENCOUNTER — Ambulatory Visit (INDEPENDENT_AMBULATORY_CARE_PROVIDER_SITE_OTHER): Admitting: Podiatry

## 2024-04-28 DIAGNOSIS — Z91198 Patient's noncompliance with other medical treatment and regimen for other reason: Secondary | ICD-10-CM

## 2024-04-28 NOTE — Progress Notes (Addendum)
 1. Failure to attend appointment with reason given    Appointment canceled by patient due to illness.

## 2024-04-29 ENCOUNTER — Ambulatory Visit: Admitting: Internal Medicine

## 2024-04-29 VITALS — BP 118/62 | HR 77 | Temp 98.9°F | Ht 63.0 in | Wt 135.6 lb

## 2024-04-29 DIAGNOSIS — Z79899 Other long term (current) drug therapy: Secondary | ICD-10-CM | POA: Diagnosis not present

## 2024-04-29 DIAGNOSIS — R3915 Urgency of urination: Secondary | ICD-10-CM | POA: Diagnosis not present

## 2024-04-29 DIAGNOSIS — J029 Acute pharyngitis, unspecified: Secondary | ICD-10-CM

## 2024-04-29 LAB — POCT URINALYSIS DIPSTICK
Bilirubin, UA: POSITIVE
Blood, UA: POSITIVE
Glucose, UA: NEGATIVE
Ketones, UA: NEGATIVE
Leukocytes, UA: NEGATIVE
Nitrite, UA: POSITIVE
Protein, UA: POSITIVE — AB
Spec Grav, UA: 1.025
Urobilinogen, UA: 0.2 U/dL
pH, UA: 5.5

## 2024-04-29 MED ORDER — DULOXETINE HCL 20 MG PO CPEP: 20.0000 mg | ORAL_CAPSULE | Freq: Every day | ORAL | 1 refills | Status: AC

## 2024-04-29 MED ORDER — DULOXETINE HCL 60 MG PO CPEP: 60.0000 mg | ORAL_CAPSULE | Freq: Every day | ORAL | 2 refills | Status: AC

## 2024-04-29 NOTE — Patient Instructions (Addendum)
 This is probably  viral illness  exam is reassuring.   Flu covid screen negative .   Filled 60 + 20 mg for cymbalta   for pain  management .   Urine   to check for infection if can collect specimen.

## 2024-04-29 NOTE — Progress Notes (Unsigned)
 "  Chief Complaint  Patient presents with   Cough    Pt is here with daughter. C/o sore throat for 3 days. No headache, congestion, bodyache, fatigue. Pt states no more than usually. Cough is worse in the morning. Taking cough drop.    Sore Throat   Urinary Incontinence    Daughter would like to run uti for urgency to urinate.     HPI: [name] [name] 84 y.o. come in for Chronic disease management  ROS: See pertinent positives and negatives per HPI.  Past Medical History:  Diagnosis Date   Acute kidney failure    5 years ago   ACUTE KIDNEY FAILURE UNSPECIFIED 07/12/2009   Allergy    Anemia    Anxiety    Arthritis    Basal cell carcinoma    Blood clot in vein    states it was not deep vein   CAD (coronary artery disease)    Mild nonobstructive by CT   Cataract    Cerebral aneurysm without rupture 2021   Chronic kidney disease    stones   Depression    around the time of her husband's death   Diverticulosis of colon (without mention of hemorrhage)    Fibromyalgia    GERD (gastroesophageal reflux disease)    Headache    Hiatal hernia    History of kidney stones    Hyperlipidemia    Hypertension    Interstitial cystitis    Kidney stone    Osteopenia    PONV (postoperative nausea and vomiting)    Scoliosis    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   Qualifier: Diagnosis of  By: Jakie MD NOLIA Alm SAUNDERS    Vertigo     Family History  Problem Relation Age of Onset   Alzheimer's disease Mother    Lung cancer Father        tobacco   Kidney cancer Son    Stroke Son    Arthritis Son    Hypertension Brother        mom's side   Osteoporosis Sister    Hypertension Sister    Breast cancer Cousin    Heart disease Maternal Uncle        x 4   Heart disease Maternal Aunt        x 4   Diabetes Paternal Grandmother    Diabetes Paternal Uncle        x 2   Arthritis Daughter    Colon cancer Neg Hx     Social History   Socioeconomic History   Marital status: Widowed     Spouse name: Not on file   Number of children: 2   Years of education: 8   Highest education level: Not on file  Occupational History   Occupation: Retired   Tobacco Use   Smoking status: Never   Smokeless tobacco: Never  Vaping Use   Vaping status: Never Used  Substance and Sexual Activity   Alcohol use: Not Currently    Alcohol/week: 0.0 standard drinks of alcohol    Comment: rare   Drug use: No   Sexual activity: Not on file  Other Topics Concern   Not on file  Social History Narrative   Widowed 10/09 husband scd   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   Right handed   Lives alone   Daughter sherry   Social Drivers of Health   Tobacco Use: Low Risk (01/13/2024)   Patient History  Smoking Tobacco Use: Never    Smokeless Tobacco Use: Never    Passive Exposure: Not on file  Financial Resource Strain: Low Risk (06/26/2021)   Overall Financial Resource Strain (CARDIA)    Difficulty of Paying Living Expenses: Not hard at all  Food Insecurity: No Food Insecurity (12/02/2023)   Epic    Worried About Programme Researcher, Broadcasting/film/video in the Last Year: Never true    Ran Out of Food in the Last Year: Never true  Transportation Needs: No Transportation Needs (12/02/2023)   Epic    Lack of Transportation (Medical): No    Lack of Transportation (Non-Medical): No  Physical Activity: Inactive (06/26/2021)   Exercise Vital Sign    Days of Exercise per Week: 0 days    Minutes of Exercise per Session: 0 min  Stress: No Stress Concern Present (06/26/2021)   [employer] of Occupational Health - Occupational Stress Questionnaire    Feeling of Stress : Not at all  Social Connections: Moderately Integrated (12/02/2023)   Social Connection and Isolation Panel    Frequency of Communication with Friends and Family: More than three times a week    Frequency of Social Gatherings with Friends and Family: More than three times a week    Attends Religious Services: 1 to 4 times per year     Active Member of [employer] or Organizations: Yes    Attends Banker Meetings: 1 to 4 times per year    Marital Status: Widowed  Depression (PHQ2-9): Low Risk (02/21/2022)   Depression (PHQ2-9)    PHQ-2 Score: 0  Alcohol Screen: Low Risk (06/26/2021)   Alcohol Screen    Last Alcohol Screening Score (AUDIT): 0  Housing: Low Risk (12/02/2023)   Epic    Unable to Pay for Housing in the Last Year: No    Number of Times Moved in the Last Year: 0    Homeless in the Last Year: No  Utilities: Not At Risk (12/02/2023)   Epic    Threatened with loss of utilities: No  Health Literacy: Not on file    Outpatient Medications Prior to Visit  Medication Sig Dispense Refill   acetaminophen  (TYLENOL ) 325 MG tablet Take 2 tablets (650 mg total) by mouth every 4 (four) hours as needed for mild pain (pain score 1-3) or fever (or temp > 37.5 C (99.5 F)).     amLODipine  (NORVASC ) 10 MG tablet Take 1 tablet (10 mg total) by mouth daily. 90 tablet 1   DULoxetine  (CYMBALTA ) 60 MG capsule Take 1 capsule (60 mg total) by mouth 2 (two) times daily. (Patient taking differently: Take 60 mg by mouth daily.) 60 capsule 0   magnesium  gluconate (MAGONATE) 500 (27 Mg) MG TABS tablet Take 1 tablet (500 mg total) by mouth daily. 30 tablet 0   memantine  (NAMENDA ) 10 MG tablet Take 1 tablet (10 mg total) by mouth 2 (two) times daily. 60 tablet 0   metoprolol  succinate (TOPROL -XL) 25 MG 24 hr tablet TAKE 1 TABLET (25 MG TOTAL) BY MOUTH DAILY. STOP THE LOPRESSOR  (TO AVOID TWICE A DAY DOSING.) 90 tablet 1   polyethylene glycol (MIRALAX  / GLYCOLAX ) 17 g packet Take 17 g by mouth daily. (Patient taking differently: Take 17 g by mouth daily as needed.)     rosuvastatin  (CRESTOR ) 20 MG tablet Take 1 tablet (20 mg total) by mouth daily. 90 tablet 1   vitamin D3 (CHOLECALCIFEROL ) 25 MCG tablet Take 2 tablets (2,000 Units total) by mouth daily. 30  tablet 0   docusate sodium  (COLACE) 100 MG capsule Take 1 capsule (100 mg total) by  mouth daily. (Patient not taking: Reported on 04/29/2024)     No facility-administered medications prior to visit.     EXAM:  BP 118/62 (BP Location: Left Arm, Patient Position: Sitting, Cuff Size: Normal)   Pulse 77   Temp 98.9 F (37.2 C) (Oral)   Ht 5' 3 (1.6 m)   Wt 135 lb 9.6 oz (61.5 kg)   SpO2 97%   BMI 24.02 kg/m   Body mass index is 24.02 kg/m.  GENERAL: vitals reviewed and listed above, alert, oriented, appears well hydrated and in no acute distress HEENT: atraumatic, conjunctiva  clear, no obvious abnormalities on inspection of external nose and ears OP : no lesion edema or exudate  NECK: no obvious masses on inspection palpation  LUNGS: clear to auscultation bilaterally, no wheezes, rales or rhonchi, good air movement CV: HRRR, no clubbing cyanosis or  peripheral edema nl cap refill  MS: moves all extremities without noticeable focal  abnormality PSYCH: pleasant and cooperative, no obvious depression or anxiety Lab Results  Component Value Date   WBC 6.2 12/10/2023   HGB 14.3 12/10/2023   HCT 45.1 12/10/2023   PLT 222 12/10/2023   GLUCOSE 81 12/12/2023   CHOL 182 12/02/2023   TRIG 71 12/02/2023   HDL 60 12/02/2023   LDLDIRECT 179.1 10/09/2007   LDLCALC 108 (H) 12/02/2023   ALT 12 12/08/2023   AST 18 12/08/2023   NA 139 12/12/2023   K 4.7 12/12/2023   CL 112 (H) 12/12/2023   CREATININE 1.02 (H) 12/12/2023   BUN 25 (H) 12/12/2023   CO2 20 (L) 12/12/2023   TSH 1.35 10/16/2022   INR 1.0 10/31/2020   HGBA1C 5.2 12/02/2023   BP Readings from Last 3 Encounters:  04/29/24 118/62  01/07/24 100/61  01/06/24 118/64    ASSESSMENT AND PLAN:  Discussed the following assessment and plan:  No diagnosis found.  -Patient advised to return or notify health care team  if  new concerns arise.  There are no Patient Instructions on file for this visit.   Jan Walters K. Obryan Radu M.D. "

## 2024-05-01 ENCOUNTER — Encounter: Payer: Self-pay | Admitting: Internal Medicine

## 2024-05-02 LAB — URINE CULTURE
MICRO NUMBER:: 17443449
SPECIMEN QUALITY:: ADEQUATE

## 2024-05-03 ENCOUNTER — Ambulatory Visit: Payer: Self-pay | Admitting: Internal Medicine

## 2024-05-03 MED ORDER — CEPHALEXIN 500 MG PO CAPS: 500.0000 mg | ORAL_CAPSULE | Freq: Two times a day (BID) | ORAL | 0 refills | Status: AC

## 2024-05-03 NOTE — Progress Notes (Signed)
 Urine culture positive for e coli uti infection.  Please send in antibiotic  keflex  500 mg 1 po bid for 7 days disp 14

## 2024-05-12 NOTE — Progress Notes (Signed)
 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 RH female with a history of  hypertension, B12 deficiency, arthritis, vitamin D  deficiency, history of embolized aneurysm in 2021, anxiety, depression, fibromyalgia, GERD, CAD, CKD stage III, hemorrhagic stroke of unclear etiology in 2025, dementia likely due to Alzheimer disease and vascular seen today in follow up for memory loss. Patient is on memantine  10 mg twice daily (unable to tolerate donepezil ).   Patient was last seen on 01/07/2024 after the stroke and on 06/26/2023 for evaluation of memory .  Cognitive decline is noted, unable to test with MMSE.  She needs more assistance with her ADLs.  Mood is fair.  Discussed with her daughter the progression of the disease, and the role of palliative care as the disease progresses.  Patient's daughter is interested in a consultation with further care, will place a referral.  This patient is accompanied in the office by her daughter who supplements the history.  Previous records as well as any outside records available were reviewed prior to todays visit.   Follow up in 6 months Referral to palliative care Continue memantine  10 mg twice daily for now, side effects discussed Recommend good control of cardiovascular risk factors.   Continue to control mood as per PCP  History of hemorrhagic stroke of unclear etiology (see note from 01/07/2024)  Recommend good control of cardiovascular risk factors.   Secondary stroke prevention, with goal BP less than 140/90, LDL 70 and A1c less than 6    Discussed the use of AI scribe software for clinical note transcription with the patient, who gave verbal consent to proceed.  History of Present Illness    [name] [name] is an 84 year old female with vascular dementia and a history of stroke who presents with cognitive decline and recurrent urinary tract  infections. She is accompanied by her daughter, Joen, who is her primary caregiver.  She has been experiencing cognitive decline. She exhibits significant confusion, often not recognizing her home of 35 years and forgetting significant past events, such as the deaths of her mother and husband. She frequently refers to her adult children as if they were still young. Her cognitive status gets worse after every UTI , does not return to baseline (including one treated with antibiotics three days ago). Her daughter notes that these infections exacerbate her cognitive decline.  She has urinary incontinence, managed with Depends. Her daughter reports that she has a good appetite but needs reminders to eat and drink, particularly to prevent dehydration and urinary tract infections.No hallucinations and no recent hospitalizations aside from the stroke.  She has a history of a hemorrhagic stroke and has had MRIs that showed vascular changes and amyloid angiopathy, as reported to her and her family. She is not on blood thinners due to the nature of the stroke. Her cognitive decline is characterized by difficulty with memory, task completion, and using household items like the phone and TV remote. She requires assistance with daily activities, including dressing and eating, although she can feed herself if food is provided.  As before, she continues to sleep during the day and may nap at different times of the night, she may be having some dreams.  She lives alone but is rarely left alone for more than two hours at a time. She receives assistance from caregivers multiple times a day to help with meals and daily  routines. She uses a walker at how for mobility due to left leg weakness and scoliosis.    Daughter is in charge of the medications and finances.       Initial visit June 2024  How long did patient have memory difficulties? Within the last 1.5 years especially after the aneurysm embolization. Patient  has some difficulty remembering recent conversations and people names. STM worse that LTM.  repeats oneself?  Endorsed. During the visit she did repeat herself often. Disoriented when walking into a room?  Patient denies    Leaving objects in unusual places?  denies   Wandering behavior?  One time a neighbor had to bring her back home because she was knocking on doors looking for her children.  Any personality changes ? Endorsed, over the last 6 months, with more irritability.   Any history of depression?: Endorsed, especially after her husband died 14 years ago.  Hallucinations or paranoia? Endorsed for the last 2 months. She saw her children as young. She always worries about the little children. She told her daughter that she was in a storage facility -she has not been there in 66 years-then realized that she was at home.  She has seen dead family members. Grandma Pansie was visiting Seizures? denies    Any sleep changes? She reports sleeping well, but her daughter reports that she has an odd sleeping pattern, sometimes she may be up all night.   Denies vivid dreams, REM behavior or sleepwalking   Sleep apnea? denies   Any hygiene concerns?  denies   Independent of bathing and dressing? Endorsed  Does the patient need help with medications? Daughter is in charge   Who is in charge of the finances? Daughter is in charge     Any changes in appetite?   Daughter says she eats one meal a day that she brings to her. She may have forgotten that she did not eat otherwise    Patient have trouble swallowing?  denies   Does the patient cook? No  Any headaches?  denies   Chronic pain? denies   Ambulates with difficulty?  Needs a cane but does not want to use it.  Recent falls or head injuries? Yesterday she fell on the yard hurting the fingers with thorns, but not hitting her head or LOC Vision changes? Denies Unilateral weakness, numbness or tingling? denies   Any tremors? denies   Any anosmia?  denies   Any incontinence of urine? Endorsed, refuses to wear diapers  Any bowel dysfunction? denies      Patient lives alone.   History of heavy alcohol intake? denies   History of heavy tobacco use? denies   Family history of dementia?   Her mother and aunts with dementia AD Does patient drive? No       Prior medication: Donepezil  10 mg daily, GI side effects of bad dreams       No data to display            10/16/2022    4:00 PM  Montreal Cognitive Assessment   Visuospatial/ Executive (0/5) 1  Naming (0/3) 2  Attention: Read list of digits (0/2) 2  Attention: Read list of letters (0/1) 1  Attention: Serial 7 subtraction starting at 100 (0/3) 1  Language: Repeat phrase (0/2) 2  Language : Fluency (0/1) 1  Abstraction (0/2) 1  Delayed Recall (0/5) 0  Orientation (0/6) 3  Total 14  Adjusted Score (based on education) 15  Objective:    Neurological Exam:    VITALS:   Vitals:   05/13/24 1053  Height: 5' 3 (1.6 m)    GEN:  The patient appears stated age and is in NAD. HEENT:  Normocephalic, atraumatic.   Neurological examination:  General: NAD, well-groomed, appears stated age. Orientation: The patient is alert.  Not oriented to person, place or date Cranial nerves: Chronic mild right facial droop the speech is less fluent but clear.  No aphasia or dysarthria. Fund of knowledge is reduced. Recent and remote memory are impaired. Attention and concentration are reduced.  Unable to name objects and repeat phrases.  Hearing is decreased to conversational tone.  Sensation: Sensation is intact to light touch throughout Motor: BUE 4/5, LLE 3/5 strength, RLE 5/5 DTR's 2/4 in UE/LE     Movement examination:  Tone: There is normal tone in the UE/LE Abnormal movements:  no tremor.  No myoclonus.  No asterixis.   Coordination:  There is no decremation with RAM's. Normal finger to nose on the left.  On the right is abnormal. Gait and Station: The patient has  difficulty arising out of a deep-seated chair.  Today she is in a wheelchair, gait and stride have not been tested for increased deconditioning.   Thank you for allowing us  the opportunity to participate in the care of this nice patient. Please do not hesitate to contact us  for any questions or concerns.   Total time spent on today's visit was 31 minutes dedicated to this patient today, preparing to see patient, examining the patient, ordering tests and/or medications and counseling the patient, documenting clinical information in the EHR or other health record, independently interpreting results and communicating results to the patient/family, discussing treatment and goals, answering patient's questions and coordinating care.  Cc:  Charlett Apolinar POUR, MD  Camie Sevin 05/13/2024 1:53 PM      "

## 2024-05-13 ENCOUNTER — Ambulatory Visit: Admitting: Physician Assistant

## 2024-05-13 ENCOUNTER — Encounter: Payer: Self-pay | Admitting: Physician Assistant

## 2024-05-13 VITALS — Ht 63.0 in

## 2024-05-13 DIAGNOSIS — F03918 Unspecified dementia, unspecified severity, with other behavioral disturbance: Secondary | ICD-10-CM | POA: Diagnosis not present

## 2024-05-13 DIAGNOSIS — F02C0 Dementia in other diseases classified elsewhere, severe, without behavioral disturbance, psychotic disturbance, mood disturbance, and anxiety: Secondary | ICD-10-CM

## 2024-05-26 ENCOUNTER — Ambulatory Visit: Admitting: Internal Medicine

## 2024-08-03 ENCOUNTER — Ambulatory Visit: Admitting: Podiatry

## 2024-11-11 ENCOUNTER — Ambulatory Visit: Payer: Self-pay | Admitting: Physician Assistant
